# Patient Record
Sex: Male | Born: 1945
Health system: Southern US, Community
[De-identification: ages and names within clinical notes are randomized; demographics above are authoritative.]

## PROBLEM LIST (undated history)

## (undated) DIAGNOSIS — K219 Gastro-esophageal reflux disease without esophagitis: Secondary | ICD-10-CM

## (undated) DIAGNOSIS — F429 Obsessive-compulsive disorder, unspecified: Secondary | ICD-10-CM

## (undated) DIAGNOSIS — C4492 Squamous cell carcinoma of skin, unspecified: Secondary | ICD-10-CM

## (undated) DIAGNOSIS — I2510 Atherosclerotic heart disease of native coronary artery without angina pectoris: Secondary | ICD-10-CM

## (undated) DIAGNOSIS — E785 Hyperlipidemia, unspecified: Secondary | ICD-10-CM

## (undated) DIAGNOSIS — I10 Essential (primary) hypertension: Secondary | ICD-10-CM

## (undated) DIAGNOSIS — K602 Anal fissure, unspecified: Secondary | ICD-10-CM

## (undated) DIAGNOSIS — L409 Psoriasis, unspecified: Secondary | ICD-10-CM

## (undated) DIAGNOSIS — L408 Other psoriasis: Secondary | ICD-10-CM

## (undated) DIAGNOSIS — M549 Dorsalgia, unspecified: Secondary | ICD-10-CM

## (undated) DIAGNOSIS — K5730 Diverticulosis of large intestine without perforation or abscess without bleeding: Secondary | ICD-10-CM

## (undated) DIAGNOSIS — Z86010 Personal history of colonic polyps: Secondary | ICD-10-CM

## (undated) HISTORY — DX: Gastro-esophageal reflux disease without esophagitis: K21.9

## (undated) HISTORY — PX: TONSILLECTOMY: SUR1361

## (undated) HISTORY — DX: Obsessive-compulsive disorder, unspecified: F42.9

## (undated) HISTORY — DX: Diverticulosis of large intestine without perforation or abscess without bleeding: K57.30

## (undated) HISTORY — PX: SKIN CANCER EXCISION: SHX779

## (undated) HISTORY — DX: Other psoriasis: L40.8

## (undated) HISTORY — DX: Personal history of colonic polyps: Z86.010

## (undated) HISTORY — DX: Anal fissure, unspecified: K60.2

## (undated) HISTORY — DX: Essential (primary) hypertension: I10

## (undated) HISTORY — DX: Dorsalgia, unspecified: M54.9

## (undated) HISTORY — DX: Psoriasis, unspecified: L40.9

## (undated) HISTORY — DX: Hyperlipidemia, unspecified: E78.5

## (undated) HISTORY — PX: POLYPECTOMY: SHX149

## (undated) HISTORY — PX: COLONOSCOPY: SHX174

## (undated) HISTORY — PX: COLONOSCOPY W/ POLYPECTOMY: SHX1380

## (undated) HISTORY — DX: Squamous cell carcinoma of skin, unspecified: C44.92

---

## 2005-07-24 ENCOUNTER — Ambulatory Visit: Payer: Self-pay | Admitting: Internal Medicine

## 2005-08-26 ENCOUNTER — Ambulatory Visit: Payer: Self-pay | Admitting: Internal Medicine

## 2005-09-11 ENCOUNTER — Ambulatory Visit: Payer: Self-pay | Admitting: Gastroenterology

## 2005-09-23 ENCOUNTER — Encounter (INDEPENDENT_AMBULATORY_CARE_PROVIDER_SITE_OTHER): Payer: Self-pay | Admitting: *Deleted

## 2005-09-23 ENCOUNTER — Ambulatory Visit: Payer: Self-pay | Admitting: Gastroenterology

## 2005-11-25 ENCOUNTER — Ambulatory Visit: Payer: Self-pay | Admitting: Internal Medicine

## 2006-08-11 ENCOUNTER — Ambulatory Visit: Payer: Self-pay | Admitting: Internal Medicine

## 2006-08-11 LAB — CONVERTED CEMR LAB
Albumin: 3.4 g/dL — ABNORMAL LOW (ref 3.5–5.2)
Chol/HDL Ratio, serum: 3.8
LDL Cholesterol: 120 mg/dL — ABNORMAL HIGH (ref 0–99)
Total Bilirubin: 0.9 mg/dL (ref 0.3–1.2)
Triglyceride fasting, serum: 89 mg/dL (ref 0–149)
VLDL: 18 mg/dL (ref 0–40)

## 2006-09-01 ENCOUNTER — Ambulatory Visit: Payer: Self-pay | Admitting: Internal Medicine

## 2006-09-22 ENCOUNTER — Ambulatory Visit: Payer: Self-pay

## 2006-10-27 ENCOUNTER — Ambulatory Visit: Payer: Self-pay | Admitting: Internal Medicine

## 2007-02-10 DIAGNOSIS — E785 Hyperlipidemia, unspecified: Secondary | ICD-10-CM | POA: Insufficient documentation

## 2007-02-10 DIAGNOSIS — L408 Other psoriasis: Secondary | ICD-10-CM

## 2007-02-10 DIAGNOSIS — M549 Dorsalgia, unspecified: Secondary | ICD-10-CM

## 2007-02-10 DIAGNOSIS — K5730 Diverticulosis of large intestine without perforation or abscess without bleeding: Secondary | ICD-10-CM | POA: Insufficient documentation

## 2007-02-10 HISTORY — DX: Other psoriasis: L40.8

## 2007-02-10 HISTORY — DX: Dorsalgia, unspecified: M54.9

## 2007-02-16 ENCOUNTER — Ambulatory Visit: Payer: Self-pay | Admitting: Internal Medicine

## 2007-02-16 LAB — CONVERTED CEMR LAB
ALT: 20 units/L (ref 0–53)
AST: 22 units/L (ref 0–37)
Bilirubin, Direct: 0.2 mg/dL (ref 0.0–0.3)
Cholesterol: 198 mg/dL (ref 0–200)
HDL: 51.2 mg/dL (ref >39.0)
LDL Cholesterol: 123 mg/dL — ABNORMAL HIGH (ref 0–99)
Total Protein: 6.9 g/dL (ref 6.0–8.3)

## 2007-02-23 ENCOUNTER — Ambulatory Visit: Payer: Self-pay | Admitting: Internal Medicine

## 2007-02-23 DIAGNOSIS — K219 Gastro-esophageal reflux disease without esophagitis: Secondary | ICD-10-CM

## 2007-02-23 HISTORY — DX: Gastro-esophageal reflux disease without esophagitis: K21.9

## 2007-03-12 ENCOUNTER — Telehealth: Payer: Self-pay | Admitting: Internal Medicine

## 2007-08-03 ENCOUNTER — Ambulatory Visit: Payer: Self-pay | Admitting: Internal Medicine

## 2007-08-03 LAB — CONVERTED CEMR LAB
ALT: 35 units/L (ref 0–53)
Basophils Relative: 0.5 % (ref 0.0–1.0)
Bilirubin, Direct: 0.2 mg/dL (ref 0.0–0.3)
CO2: 27 meq/L (ref 19–32)
Eosinophils Relative: 1.9 % (ref 0.0–5.0)
GFR calc Af Amer: 98 mL/min
Glucose, Bld: 108 mg/dL — ABNORMAL HIGH (ref 70–99)
Hemoglobin: 15.7 g/dL (ref 13.0–17.0)
Lymphocytes Relative: 38.6 % (ref 12.0–46.0)
Monocytes Absolute: 0.6 10*3/uL (ref 0.2–0.7)
Neutro Abs: 2.4 10*3/uL (ref 1.4–7.7)
Nitrite: NEGATIVE
Potassium: 4.2 meq/L (ref 3.5–5.1)
Specific Gravity, Urine: 1.02
TSH: 1.97 microintl units/mL (ref 0.35–5.50)
Total Protein: 6.5 g/dL (ref 6.0–8.3)
VLDL: 14 mg/dL (ref 0–40)
WBC Urine, dipstick: NEGATIVE
WBC: 5.1 10*3/uL (ref 4.5–10.5)

## 2007-08-10 ENCOUNTER — Ambulatory Visit: Payer: Self-pay | Admitting: Internal Medicine

## 2007-08-10 LAB — CONVERTED CEMR LAB: LDL Goal: 130 mg/dL

## 2008-02-05 ENCOUNTER — Ambulatory Visit: Payer: Self-pay | Admitting: Internal Medicine

## 2008-02-05 DIAGNOSIS — T887XXA Unspecified adverse effect of drug or medicament, initial encounter: Secondary | ICD-10-CM | POA: Insufficient documentation

## 2008-02-05 LAB — CONVERTED CEMR LAB
AST: 26 units/L (ref 0–37)
Albumin: 3.3 g/dL — ABNORMAL LOW (ref 3.5–5.2)
HDL: 47.4 mg/dL (ref >39.0)
LDL Cholesterol: 122 mg/dL — ABNORMAL HIGH (ref 0–99)
Total Bilirubin: 0.9 mg/dL (ref 0.3–1.2)
Total CHOL/HDL Ratio: 3.9
VLDL: 14 mg/dL (ref 0–40)

## 2008-02-15 ENCOUNTER — Ambulatory Visit: Payer: Self-pay | Admitting: Internal Medicine

## 2008-08-08 ENCOUNTER — Ambulatory Visit: Payer: Self-pay | Admitting: Internal Medicine

## 2008-08-08 LAB — CONVERTED CEMR LAB
AST: 22 units/L (ref 0–37)
Albumin: 3.5 g/dL (ref 3.5–5.2)
BUN: 15 mg/dL (ref 6–23)
Basophils Absolute: 0 10*3/uL (ref 0.0–0.1)
Basophils Relative: 0.3 % (ref 0.0–3.0)
Bilirubin Urine: NEGATIVE
Blood in Urine, dipstick: NEGATIVE
Chloride: 105 meq/L (ref 96–112)
Cholesterol: 198 mg/dL (ref 0–200)
Creatinine, Ser: 0.8 mg/dL (ref 0.4–1.5)
Eosinophils Absolute: 0.1 10*3/uL (ref 0.0–0.7)
Eosinophils Relative: 2 % (ref 0.0–5.0)
GFR calc Af Amer: 127 mL/min
GFR calc non Af Amer: 105 mL/min
Glucose, Urine, Semiquant: NEGATIVE
HCT: 46.2 % (ref 39.0–52.0)
HDL: 57.4 mg/dL (ref >39.0)
Ketones, urine, test strip: NEGATIVE
MCHC: 34.7 g/dL (ref 30.0–36.0)
MCV: 99.9 fL (ref 78.0–100.0)
Monocytes Absolute: 0.5 10*3/uL (ref 0.1–1.0)
Neutrophils Relative %: 47.1 % (ref 43.0–77.0)
PSA: 0.48 ng/mL (ref 0.10–4.00)
Platelets: 152 10*3/uL (ref 150–400)
Potassium: 4.1 meq/L (ref 3.5–5.1)
Protein, U semiquant: NEGATIVE
Specific Gravity, Urine: 1.02
Total Bilirubin: 0.9 mg/dL (ref 0.3–1.2)
Triglycerides: 63 mg/dL (ref 0–149)
VLDL: 13 mg/dL (ref 0–40)
pH: 7

## 2008-08-15 ENCOUNTER — Ambulatory Visit: Payer: Self-pay | Admitting: Internal Medicine

## 2009-02-06 ENCOUNTER — Ambulatory Visit: Payer: Self-pay | Admitting: Internal Medicine

## 2009-02-06 LAB — CONVERTED CEMR LAB
ALT: 23 units/L (ref 0–53)
AST: 30 units/L (ref 0–37)
Albumin: 3.4 g/dL — ABNORMAL LOW (ref 3.5–5.2)
Cholesterol: 202 mg/dL — ABNORMAL HIGH (ref 0–200)
Direct LDL: 132.5 mg/dL
HDL: 55.8 mg/dL (ref >39.00)
Total Protein: 6.9 g/dL (ref 6.0–8.3)
Triglycerides: 131 mg/dL (ref 0.0–149.0)

## 2009-02-13 ENCOUNTER — Ambulatory Visit: Payer: Self-pay | Admitting: Internal Medicine

## 2009-08-21 ENCOUNTER — Ambulatory Visit: Payer: Self-pay | Admitting: Internal Medicine

## 2009-08-21 LAB — CONVERTED CEMR LAB
AST: 30 units/L (ref 0–37)
Alkaline Phosphatase: 46 units/L (ref 39–117)
Basophils Relative: 0.9 % (ref 0.0–3.0)
Bilirubin, Direct: 0.1 mg/dL (ref 0.0–0.3)
Calcium: 9.3 mg/dL (ref 8.4–10.5)
Creatinine, Ser: 1.1 mg/dL (ref 0.4–1.5)
Direct LDL: 134.7 mg/dL
Eosinophils Absolute: 0.1 10*3/uL (ref 0.0–0.7)
Eosinophils Relative: 1.8 % (ref 0.0–5.0)
GFR calc non Af Amer: 72.14 mL/min (ref >60)
Hemoglobin: 15.8 g/dL (ref 13.0–17.0)
Lymphocytes Relative: 31.8 % (ref 12.0–46.0)
MCHC: 33.5 g/dL (ref 30.0–36.0)
Monocytes Relative: 10.5 % (ref 3.0–12.0)
Neutrophils Relative %: 55 % (ref 43.0–77.0)
PSA: 0.55 ng/mL (ref 0.10–4.00)
RBC: 4.66 M/uL (ref 4.22–5.81)
Sodium: 139 meq/L (ref 135–145)
Total CHOL/HDL Ratio: 3
Total Protein: 7.5 g/dL (ref 6.0–8.3)
Triglycerides: 96 mg/dL (ref 0.0–149.0)
WBC: 5.2 10*3/uL (ref 4.5–10.5)

## 2010-02-12 ENCOUNTER — Ambulatory Visit: Payer: Self-pay | Admitting: Internal Medicine

## 2010-02-12 LAB — CONVERTED CEMR LAB
ALT: 23 units/L (ref 0–53)
AST: 28 units/L (ref 0–37)
Bilirubin, Direct: 0.2 mg/dL (ref 0.0–0.3)
Total Bilirubin: 0.7 mg/dL (ref 0.3–1.2)
Triglycerides: 140 mg/dL (ref 0.0–149.0)

## 2010-02-19 ENCOUNTER — Ambulatory Visit: Payer: Self-pay | Admitting: Internal Medicine

## 2010-04-17 ENCOUNTER — Telehealth: Payer: Self-pay | Admitting: Internal Medicine

## 2010-08-20 ENCOUNTER — Other Ambulatory Visit: Payer: Self-pay | Admitting: Internal Medicine

## 2010-08-20 ENCOUNTER — Ambulatory Visit
Admission: RE | Admit: 2010-08-20 | Discharge: 2010-08-20 | Payer: Self-pay | Source: Home / Self Care | Attending: Internal Medicine | Admitting: Internal Medicine

## 2010-08-20 LAB — LIPID PANEL
Cholesterol: 212 mg/dL — ABNORMAL HIGH (ref 0–200)
Total CHOL/HDL Ratio: 3
Triglycerides: 108 mg/dL (ref 0.0–149.0)
VLDL: 21.6 mg/dL (ref 0.0–40.0)

## 2010-08-20 LAB — CBC WITH DIFFERENTIAL/PLATELET
Basophils Relative: 0.4 % (ref 0.0–3.0)
Eosinophils Relative: 1.2 % (ref 0.0–5.0)
Hemoglobin: 16.4 g/dL (ref 13.0–17.0)
Lymphocytes Relative: 28 % (ref 12.0–46.0)
Monocytes Relative: 7.7 % (ref 3.0–12.0)
Neutrophils Relative %: 62.7 % (ref 43.0–77.0)
RBC: 4.69 Mil/uL (ref 4.22–5.81)
WBC: 8.4 10*3/uL (ref 4.5–10.5)

## 2010-08-20 LAB — CONVERTED CEMR LAB
Blood in Urine, dipstick: NEGATIVE
Nitrite: NEGATIVE
Protein, U semiquant: NEGATIVE
Urobilinogen, UA: 0.2
WBC Urine, dipstick: NEGATIVE

## 2010-08-20 LAB — BASIC METABOLIC PANEL
Calcium: 9.1 mg/dL (ref 8.4–10.5)
Creatinine, Ser: 1.1 mg/dL (ref 0.4–1.5)
Sodium: 137 mEq/L (ref 135–145)

## 2010-08-20 LAB — HEPATIC FUNCTION PANEL
ALT: 26 U/L (ref 0–53)
AST: 25 U/L (ref 0–37)
Alkaline Phosphatase: 48 U/L (ref 39–117)
Bilirubin, Direct: 0.1 mg/dL (ref 0.0–0.3)
Total Protein: 6.9 g/dL (ref 6.0–8.3)

## 2010-08-20 LAB — LDL CHOLESTEROL, DIRECT: Direct LDL: 137.2 mg/dL

## 2010-08-24 ENCOUNTER — Ambulatory Visit (INDEPENDENT_AMBULATORY_CARE_PROVIDER_SITE_OTHER)
Admission: RE | Admit: 2010-08-24 | Discharge: 2010-08-24 | Payer: Self-pay | Source: Home / Self Care | Attending: Internal Medicine | Admitting: Internal Medicine

## 2010-08-24 DIAGNOSIS — C4492 Squamous cell carcinoma of skin, unspecified: Secondary | ICD-10-CM

## 2010-08-24 DIAGNOSIS — Z0000 Encounter for general adult medical examination without abnormal findings: Secondary | ICD-10-CM

## 2010-08-24 HISTORY — DX: Squamous cell carcinoma of skin, unspecified: C44.92

## 2010-08-27 ENCOUNTER — Other Ambulatory Visit: Payer: Self-pay | Admitting: Dermatology

## 2010-08-29 NOTE — Progress Notes (Signed)
Summary: DRUG SCREEN TEST ASAP   Phone Note Call from Patient Call back at Home Phone [phone] Call back at [phone]   Caller: Patient Call For: Stacie Glaze MD Reason for Call: Acute Illness Summary of Call: PT NEEDS DRUG SCREENING TEST DONE NOW AND RESULTS MUST BE AVAILABLE NO LATER FRIDAY 04-20-2010 FOR WORK ASSIGNMENT IN [place] AT [place] Initial call taken by: Heron Sabins,  April 17, 2010 11:05 AM  Follow-up for Phone Call        we can do here, but he will need to find out exactly what drug test he needs-there are many kins-he can call company and ask them Follow-up by: Willy Eddy, LPN,  April 17, 2010 11:09 AM  Additional Follow-up for Phone Call Additional follow up Details #1::        PT WILL CB WITH SPECIFIC TEST NEEDED Additional Follow-up by: Heron Sabins,  April 17, 2010 11:18 AM    Additional Follow-up for Phone Call Additional follow up Details #2::    as of 04-24-2010 pt did not cb Follow-up by: Heron Sabins,  April 24, 2010 3:03 PM

## 2010-08-29 NOTE — Assessment & Plan Note (Signed)
Summary: 6 MONTH FOLLOW UP/CJR   Vital Signs:  Patient profile:   65 year old male Height:      72 inches Weight:      225 pounds BMI:     30.63 Temp:     98.2 degrees F oral Pulse rate:   68 / minute Pulse rhythm:   regular Resp:     14 per minute BP sitting:   140 / 80  (left arm)  Vitals Entered By: Willy Eddy, LPN (February 19, 2010 9:47 AM) CC: [facility]   Primary Care Provider:  Stacie Glaze MD  CC:  [facility].  History of Present Illness:  Hyperlipidemia Follow-Up      This is a 65 year old man who presents for Hyperlipidemia follow-up.  The patient denies muscle aches, GI upset, abdominal pain, flushing, itching, constipation, diarrhea, and fatigue.  The patient denies the following symptoms: chest pain/pressure, exercise intolerance, dypsnea, palpitations, syncope, and pedal edema.  Compliance with medications (by patient report) has been near 100%.  Dietary compliance has been good.  The patient reports exercising 3-4X per week.  Adjunctive measures currently used by the patient include fish oil supplements.    Preventive Screening-Counseling & Management  Alcohol-Tobacco     Smoking Status: never  Problems Prior to Update: 1)  Uns Advrs Eff Uns Rx Medicinal&biological Sbstnc  (ICD-995.20) 2)  Preventive Health Care  (ICD-V70.0) 3)  Routine General Medical Exam@health  Care Facl  (ICD-V70.0) 4)  Gerd  (ICD-530.81) 5)  Back Pain, Chronic  (ICD-724.5) 6)  Psoriasis  (ICD-696.1) 7)  Hyperlipidemia  (ICD-272.4) 8)  Diverticulosis, Colon  (ICD-562.10)  Current Problems (verified): 1)  Uns Advrs Eff Uns Rx Medicinal&biological Sbstnc  (ICD-995.20) 2)  Preventive Health Care  (ICD-V70.0) 3)  Routine General Medical Exam@health  Care Facl  (ICD-V70.0) 4)  Gerd  (ICD-530.81) 5)  Back Pain, Chronic  (ICD-724.5) 6)  Psoriasis  (ICD-696.1) 7)  Hyperlipidemia  (ICD-272.4) 8)  Diverticulosis, Colon  (ICD-562.10)  Medications Prior to Update: 1)  Famotidine 20 Mg   Tabs (Famotidine) .... One By Mouth Two Times A Day As Needed 2)  Simvastatin 40 Mg  Tabs (Simvastatin) .... Once Daily 3)  Clotrimazole-Betamethasone 1-0.05 % Lotn (Clotrimazole-Betamethasone) .... Aplly To Site Bid  Current Medications (verified): 1)  Famotidine 20 Mg  Tabs (Famotidine) .... One By Mouth Two Times A Day As Needed 2)  Lipitor 20 Mg Tabs (Atorvastatin Calcium) .... One By Mouth Daily 3)  Clotrimazole-Betamethasone 1-0.05 % Lotn (Clotrimazole-Betamethasone) .... Aplly To Site Bid 4)  Krill Oil 1000 Mg Caps (Krill Oil) .... Two By Mouth Two Times A Day  Allergies (verified): No Known Drug Allergies  Past History:  Family History: Last updated: 02/23/2007 father w CA lung Family History Lung cancer Family History of Stroke F 1st degree relative <60 no siblings  Social History: Last updated: 02/23/2007 Married Never Smoked  Risk Factors: Smoking Status: never (02/19/2010)  Past medical, surgical, family and social histories (including risk factors) reviewed, and no changes noted (except as noted below).  Past Medical History: Reviewed history from 02/23/2007 and no changes required. Diverticulosis, colon Hyperlipidemia Hypertension scalp psorisis GERD  Past Surgical History: Reviewed history from 08/10/2007 and no changes required. Tonsillectomy Colon polypectomy  Family History: Reviewed history from 02/23/2007 and no changes required. father w CA lung Family History Lung cancer Family History of Stroke F 1st degree relative <60 no siblings  Social History: Reviewed history from 02/23/2007 and no changes required. Married  Never Smoked  Review of Systems  The patient denies anorexia, fever, weight loss, weight gain, vision loss, decreased hearing, hoarseness, chest pain, syncope, dyspnea on exertion, peripheral edema, prolonged cough, headaches, hemoptysis, abdominal pain, melena, hematochezia, severe indigestion/heartburn, hematuria,  incontinence, genital sores, muscle weakness, suspicious skin lesions, transient blindness, difficulty walking, depression, unusual weight change, abnormal bleeding, enlarged lymph nodes, angioedema, breast masses, and testicular masses.    Physical Exam  General:  Well-developed,well-nourished,in no acute distress; alert,appropriate and cooperative throughout examination Head:  normocephalic and no abnormalities palpated.   Eyes:  pupils equal and pupils round.   Ears:  R ear normal and L ear normal.   Nose:  no external deformity and no nasal discharge.   Mouth:  good dentition and pharynx pink and moist.   Neck:  No deformities, masses, or tenderness noted. Lungs:  normal respiratory effort and no dullness.   Abdomen:  Bowel sounds positive,abdomen soft and non-tender without masses, organomegaly or hernias noted. Msk:  No deformity or scoliosis noted of thoracic or lumbar spine.  no joint tenderness.   Pulses:  R and L carotid,radial,femoral,dorsalis pedis and posterior tibial pulses are full and equal bilaterally Extremities:  No clubbing, cyanosis, edema, or deformity noted with normal full range of motion of all joints.     Impression & Recommendations:  Problem # 1:  HYPERLIPIDEMIA (ICD-272.4) discussd the dosing of the statin and safety of zocar and changing to lipitor His updated medication list for this problem includes:    Lipitor 20 Mg Tabs (Atorvastatin calcium) ..... One by mouth daily  Labs Reviewed: SGOT: 28 (02/12/2010)   SGPT: 23 (02/12/2010)  Lipid Goals: Chol Goal: 200 (08/10/2007)   HDL Goal: 40 (08/10/2007)   LDL Goal: 130 (08/10/2007)   TG Goal: 150 (08/10/2007)  Prior 10 Yr Risk Heart Disease: 11 % (02/15/2008)   HDL:60.70 (02/12/2010), 64.70 (08/21/2009)  LDL:128 (08/08/2008), 122 (02/05/2008)  Chol:219 (02/12/2010), 211 (08/21/2009)  Trig:140.0 (02/12/2010), 96.0 (08/21/2009)  Problem # 2:  PSORIASIS (ICD-696.1)  Problem # 3:  GERD (ICD-530.81)  His  updated medication list for this problem includes:    Famotidine 20 Mg Tabs (Famotidine) ..... One by mouth two times a day as needed  Labs Reviewed: Hgb: 15.8 (08/21/2009)   Hct: 47.2 (08/21/2009)  Problem # 4:  BACK PAIN, CHRONIC (ICD-724.5)  Complete Medication List: 1)  Famotidine 20 Mg Tabs (Famotidine) .... One by mouth two times a day as needed 2)  Lipitor 20 Mg Tabs (Atorvastatin calcium) .... One by mouth daily 3)  Clotrimazole-betamethasone 1-0.05 % Lotn (Clotrimazole-betamethasone) .... Aplly to site bid 4)  Krill Oil 1000 Mg Caps (Krill oil) .... Two by mouth two times a day  Patient Instructions: 1)  add kril oil 4 capsules a day 2)  Jan CPX Prescriptions: LIPITOR 20 MG TABS (ATORVASTATIN CALCIUM) one by mouth daily  #30 x 11   Entered and Authorized by:   Stacie Glaze MD   Signed by:   Stacie Glaze MD on 02/19/2010   Method used:   Electronically to        [phone]* (retail)       [address] [place], [place]  [address]       Ph: [phone] or [phone]       Fax: [phone]   RxID:   [id number]

## 2010-08-29 NOTE — Assessment & Plan Note (Signed)
Summary: cpx/njr/pt will be fasting//ccm   Vital Signs:  Patient profile:   65 year old male Height:      72 inches Weight:      227 pounds BMI:     30.90 Temp:     98.2 degrees F oral Pulse rate:   72 / minute Resp:     14 per minute BP sitting:   132 / 76  (left arm)  Vitals Entered By: Willy Eddy, LPN (August 21, 2009 8:54 AM) CC: cpx- did  not have labs- fasting this am   CC:  cpx- did  not have labs- fasting this am.  History of Present Illness: The pt was asked about all immunizations, health maint. services that are appropriate to their age and was given guidance on diet exercize  and weight management  Pt is doing well increased PNdrip and awakes with congestion  Preventive Screening-Counseling & Management  Alcohol-Tobacco     Smoking Status: never  Problems Prior to Update: 1)  Uns Advrs Eff Uns Rx Medicinal&biological Sbstnc  (ICD-995.20) 2)  Preventive Health Care  (ICD-V70.0) 3)  Routine General Medical Exam@health  Care Facl  (ICD-V70.0) 4)  Gerd  (ICD-530.81) 5)  Back Pain, Chronic  (ICD-724.5) 6)  Psoriasis  (ICD-696.1) 7)  Hyperlipidemia  (ICD-272.4) 8)  Diverticulosis, Colon  (ICD-562.10)  Medications Prior to Update: 1)  Famotidine 20 Mg  Tabs (Famotidine) .... One By Mouth Two Times A Day As Needed 2)  Simvastatin 40 Mg  Tabs (Simvastatin) .... Once Daily 3)  Clotrimazole-Betamethasone 1-0.05 % Lotn (Clotrimazole-Betamethasone) .... Aplly To Site Bid  Current Medications (verified): 1)  Famotidine 20 Mg  Tabs (Famotidine) .... One By Mouth Two Times A Day As Needed 2)  Simvastatin 40 Mg  Tabs (Simvastatin) .... Once Daily 3)  Clotrimazole-Betamethasone 1-0.05 % Lotn (Clotrimazole-Betamethasone) .... Aplly To Site Bid  Allergies (verified): No Known Drug Allergies  Past History:  Family History: Last updated: 02/23/2007 father w CA lung Family History Lung cancer Family History of Stroke F 1st degree relative <60 no  siblings  Social History: Last updated: 02/23/2007 Married Never Smoked  Risk Factors: Smoking Status: never (08/21/2009)  Past medical, surgical, family and social histories (including risk factors) reviewed, and no changes noted (except as noted below).  Past Medical History: Reviewed history from 02/23/2007 and no changes required. Diverticulosis, colon Hyperlipidemia Hypertension scalp psorisis GERD  Past Surgical History: Reviewed history from 08/10/2007 and no changes required. Tonsillectomy Colon polypectomy  Family History: Reviewed history from 02/23/2007 and no changes required. father w CA lung Family History Lung cancer Family History of Stroke F 1st degree relative <60 no siblings  Social History: Reviewed history from 02/23/2007 and no changes required. Married Never Smoked  Review of Systems       The patient complains of hoarseness.  The patient denies anorexia, fever, weight loss, weight gain, vision loss, decreased hearing, chest pain, syncope, dyspnea on exertion, peripheral edema, prolonged cough, headaches, hemoptysis, abdominal pain, melena, hematochezia, severe indigestion/heartburn, hematuria, incontinence, genital sores, muscle weakness, suspicious skin lesions, transient blindness, difficulty walking, depression, unusual weight change, abnormal bleeding, enlarged lymph nodes, angioedema, breast masses, and testicular masses.         Flu Vaccine Consent Questions     Do you have a history of severe allergic reactions to this vaccine? no    Any prior history of allergic reactions to egg and/or gelatin? no    Do you have a sensitivity to the preservative Thimersol? no  Do you have a past history of Guillan-Barre Syndrome? no    Do you currently have an acute febrile illness? no    Have you ever had a severe reaction to latex? no    Vaccine information given and explained to patient? yes    Are you currently pregnant? no    Lot  Number:[id number]   Exp Date:01/25/2010   Site Given  Left Deltoid IM   Physical Exam  General:  Well-developed,well-nourished,in no acute distress; alert,appropriate and cooperative throughout examination Head:  normocephalic and no abnormalities palpated.   Eyes:  pupils equal and pupils round.   Ears:  R ear normal and L ear normal.   Nose:  no external deformity and no nasal discharge.   Mouth:  good dentition and pharynx pink and moist.   Neck:  No deformities, masses, or tenderness noted. Chest Wall:  no deformities and no masses.   Lungs:  normal respiratory effort and no dullness.   Heart:  normal rate, regular rhythm, and no murmur.   Abdomen:  Bowel sounds positive,abdomen soft and non-tender without masses, organomegaly or hernias noted. Rectal:  normal sphincter tone and external hemorrhoid(s).   Genitalia:  circumcised and no urethral discharge.   Prostate:  no nodules and 1+ enlarged.   Msk:  No deformity or scoliosis noted of thoracic or lumbar spine.  no joint tenderness.   Pulses:  R and L carotid,radial,femoral,dorsalis pedis and posterior tibial pulses are full and equal bilaterally Extremities:  trace left pedal edema and trace right pedal edema.   Neurologic:  alert & oriented X3 and DTRs symmetrical and normal.   Skin:  psoriaform plaques Cervical Nodes:  No lymphadenopathy noted Axillary Nodes:  No palpable lymphadenopathy Inguinal Nodes:  No significant adenopathy Psych:  Cognition and judgment appear intact. Alert and cooperative with normal attention span and concentration. No apparent delusions, illusions, hallucinations   Impression & Recommendations:  Problem # 1:  PREVENTIVE HEALTH CARE (ICD-V70.0)  Colonoscopy: Adenomatous Polyp (09/23/2005) Td Booster: Tdap (08/15/2008)   Flu Vax: Fluvax 3+ (08/21/2009)   Chol: 202 (02/06/2009)   HDL: 55.80 (02/06/2009)   LDL: 128 (08/08/2008)   TG: 131.0 (02/06/2009) TSH: 1.89 (08/08/2008)   PSA: 0.48  (08/08/2008) Next Colonoscopy due:: 09/2010 (08/15/2008)  Discussed using sunscreen, use of alcohol, drug use, self testicular exam, routine dental care, routine eye care, routine physical exam, seat belts, multiple vitamins, osteoporosis prevention, adequate calcium intake in diet, and recommendations for immunizations.  Discussed exercise and checking cholesterol.  Discussed gun safety, safe sex, and contraception. Also recommend checking PSA.  Orders: Venipuncture ([address]) TLB-BMP (Basic Metabolic Panel-BMET) (80048-METABOL) TLB-CBC Platelet - w/Differential (85025-CBCD) TLB-Hepatic/Liver Function Pnl (80076-HEPATIC) TLB-TSH (Thyroid Stimulating Hormone) (84443-TSH) TLB-Lipid Panel (80061-LIPID) TLB-PSA (Prostate Specific Antigen) (84153-PSA) TLB-Udip ONLY (81003-UDIP)  Problem # 2:  PSORIASIS (ICD-696.1) stable  Complete Medication List: 1)  Famotidine 20 Mg Tabs (Famotidine) .... One by mouth two times a day as needed 2)  Simvastatin 40 Mg Tabs (Simvastatin) .... Once daily 3)  Clotrimazole-betamethasone 1-0.05 % Lotn (Clotrimazole-betamethasone) .... Aplly to site bid  Other Orders: Admin 1st Vaccine (60454) Flu Vaccine 1yrs + ([address])  Patient Instructions: 1)  Please schedule a follow-up appointment in 6 months. 2)  Hepatic Panel prior to visit, ICD-9:995.20 3)  Lipid Panel prior to visit, ICD-9:272.4  Appended Document: Orders Update     Clinical Lists Changes  Orders: Added new Service order of UA Dipstick W/ Micro (manual) (91478) - Signed      Appended  Document: cpx/njr/pt will be fasting//ccm  Laboratory Results   Urine Tests    Routine Urinalysis   Color: yellow Appearance: Clear Glucose: negative   (Normal Range: Negative) Bilirubin: negative   (Normal Range: Negative) Ketone: negative   (Normal Range: Negative) Spec. Gravity: 1.020   (Normal Range: 1.003-1.035) Blood: negative   (Normal Range: Negative) pH: 8.5   (Normal Range:  5.0-8.0) Protein: negative   (Normal Range: Negative) Urobilinogen: 0.2   (Normal Range: 0-1) Nitrite: negative   (Normal Range: Negative) Leukocyte Esterace: negative   (Normal Range: Negative)    Comments: Rita Ohara  August 21, 2009 1:32 PM'

## 2010-09-13 NOTE — Assessment & Plan Note (Signed)
Summary: cpx/njr   Vital Signs:  Patient profile:   65 year old male Height:      72 inches Weight:      232 pounds BMI:     31.58 Temp:     98.2 degrees F oral Pulse rate:   68 / minute Resp:     14 per minute BP sitting:   124 / 80  (left arm)  Vitals Entered By: Willy Eddy, LPN (August 24, 2010 11:02 AM) CC: CPX Is Patient Diabetic? No   Primary Care Provider:  Stacie Glaze MD  CC:  CPX.  History of Present Illness: The pt was asked about all immunizations, health maint. services that are appropriate to their age and was given guidance on diet exercize  and weight management  lesion on face that look like SCCA and has quickly grown wirth ulceration.  Preventive Screening-Counseling & Management  Alcohol-Tobacco     Smoking Status: never  Problems Prior to Update: 1)  Squamous Cell Carcinoma of Skin Site Unspecified  (ICD-173.92) 2)  Uns Advrs Eff Uns Rx Medicinal&biological Sbstnc  (ICD-995.20) 3)  Preventive Health Care  (ICD-V70.0) 4)  Routine General Medical Exam@health  Care Facl  (ICD-V70.0) 5)  Gerd  (ICD-530.81) 6)  Back Pain, Chronic  (ICD-724.5) 7)  Psoriasis  (ICD-696.1) 8)  Hyperlipidemia  (ICD-272.4) 9)  Diverticulosis, Colon  (ICD-562.10)  Current Problems (verified): 1)  Uns Advrs Eff Uns Rx Medicinal&biological Sbstnc  (ICD-995.20) 2)  Preventive Health Care  (ICD-V70.0) 3)  Routine General Medical Exam@health  Care Facl  (ICD-V70.0) 4)  Gerd  (ICD-530.81) 5)  Back Pain, Chronic  (ICD-724.5) 6)  Psoriasis  (ICD-696.1) 7)  Hyperlipidemia  (ICD-272.4) 8)  Diverticulosis, Colon  (ICD-562.10)  Medications Prior to Update: 1)  Famotidine 20 Mg  Tabs (Famotidine) .... One By Mouth Two Times A Day As Needed 2)  Lipitor 20 Mg Tabs (Atorvastatin Calcium) .... One By Mouth Daily 3)  Clotrimazole-Betamethasone 1-0.05 % Lotn (Clotrimazole-Betamethasone) .... Aplly To Site Bid 4)  Krill Oil 1000 Mg Caps (Krill Oil) .... Two By Mouth Two Times A  Day  Current Medications (verified): 1)  Famotidine 20 Mg  Tabs (Famotidine) .... One By Mouth Two Times A Day As Needed 2)  Lipitor 20 Mg Tabs (Atorvastatin Calcium) .... One By Mouth Daily 3)  Clotrimazole-Betamethasone 1-0.05 % Lotn (Clotrimazole-Betamethasone) .... Aplly To Site Bid 4)  Krill Oil 1000 Mg Caps (Krill Oil) .... Two By Mouth Two Times A Day  Allergies (verified): No Known Drug Allergies  Contraindications/Deferment of Procedures/Staging:    Test/Procedure: FLU VAX    Reason for deferment: patient declined   Past History:  Family History: Last updated: 02/23/2007 father w CA lung Family History Lung cancer Family History of Stroke F 1st degree relative <60 no siblings  Social History: Last updated: 02/23/2007 Married Never Smoked  Risk Factors: Smoking Status: never (08/24/2010)  Past medical, surgical, family and social histories (including risk factors) reviewed, and no changes noted (except as noted below).  Past Medical History: Reviewed history from 02/23/2007 and no changes required. Diverticulosis, colon Hyperlipidemia Hypertension scalp psorisis GERD  Past Surgical History: Reviewed history from 08/10/2007 and no changes required. Tonsillectomy Colon polypectomy  Family History: Reviewed history from 02/23/2007 and no changes required. father w CA lung Family History Lung cancer Family History of Stroke F 1st degree relative <60 no siblings  Social History: Reviewed history from 02/23/2007 and no changes required. Married Never Smoked  Review of Systems  The  patient denies anorexia, fever, weight loss, weight gain, vision loss, decreased hearing, hoarseness, chest pain, syncope, dyspnea on exertion, peripheral edema, prolonged cough, headaches, hemoptysis, abdominal pain, melena, hematochezia, severe indigestion/heartburn, hematuria, incontinence, genital sores, muscle weakness, suspicious skin lesions, transient blindness,  difficulty walking, depression, unusual weight change, abnormal bleeding, enlarged lymph nodes, angioedema, breast masses, and testicular masses.    Physical Exam  General:  Well-developed,well-nourished,in no acute distress; alert,appropriate and cooperative throughout examination Head:  normocephalic and no abnormalities palpated.   Eyes:  pupils equal and pupils round.   Ears:  R ear normal and L ear normal.   Nose:  no external deformity and no nasal discharge.   Mouth:  good dentition and pharynx pink and moist.   Neck:  No deformities, masses, or tenderness noted. Lungs:  normal respiratory effort and no dullness.   Heart:  normal rate, regular rhythm, and no murmur.   Abdomen:  Bowel sounds positive,abdomen soft and non-tender without masses, organomegaly or hernias noted. Rectal:  no hemorrhoids and normal sphincter tone.   Genitalia:  circumcised and no urethral discharge.   Prostate:  no nodules and 1+ enlarged.   Pulses:  R and L carotid,radial,femoral,dorsalis pedis and posterior tibial pulses are full and equal bilaterally Extremities:  No clubbing, cyanosis, edema, or deformity noted with normal full range of motion of all joints.   Neurologic:  alert & oriented X3 and DTRs symmetrical and normal.     Impression & Recommendations:  Problem # 1:  SQUAMOUS CELL CARCINOMA OF SKIN SITE UNSPECIFIED (ICD-173.92) Assessment New   patient has a lesion on his nose it appears to be either a squamous cell carcinoma or basal cell carcinoma it has arisen in the past month and grown quickly and is now ulcerated he is self referred to a dermatologist but had the appointment is one month out we are calling a dermatology office today to see if we can get him seen sooner.  Orders: Dermatology Referral (Derma)  Problem # 2:  ROUTINE GENERAL MEDICAL EXAM@HEALTH  CARE FACL (ICD-V70.0) Assessment: Unchanged  the patient is intermittently compliant with his Lipitor taking it at least 5 times  out of 7. he was urged to be compliant with 7 day a week Lipitor Colonoscopy: Adenomatous Polyp (09/23/2005) Td Booster: Tdap (08/15/2008)   Flu Vax: Fluvax 3+ (08/21/2009)   Chol: 212 (08/20/2010)   HDL: 67.80 (08/20/2010)   LDL: 128 (08/08/2008)   TG: 108.0 (08/20/2010) TSH: 1.68 (08/20/2010)   PSA: 0.50 (08/20/2010) Next Colonoscopy due:: 09/2010 (08/15/2008)  Discussed using sunscreen, use of alcohol, drug use, self testicular exam, routine dental care, routine eye care, routine physical exam, seat belts, multiple vitamins, osteoporosis prevention, adequate calcium intake in diet, and recommendations for immunizations.  Discussed exercise and checking cholesterol.  Discussed gun safety, safe sex, and contraception. Also recommend checking PSA.  Complete Medication List: 1)  Famotidine 20 Mg Tabs (Famotidine) .... One by mouth two times a day as needed 2)  Lipitor 20 Mg Tabs (Atorvastatin calcium) .... One by mouth daily 3)  Clotrimazole-betamethasone 1-0.05 % Lotn (Clotrimazole-betamethasone) .... Aplly to site bid 4)  Krill Oil 1000 Mg Caps (Krill oil) .... Two by mouth two times a day  Patient Instructions: 1)  Please schedule a follow-up appointment in 6 months. 2)  Hepatic Panel prior to visit, ICD-9:995.20 3)  Lipid Panel prior to visit, ICD-9:272.4 4)  please takt the krill oil as directed   Orders Added: 1)  Dermatology Referral [Derma] 2)  Est.  Patient 40-64 years [[id number]] 3)  Est. Patient Level III [phone]

## 2010-09-20 ENCOUNTER — Telehealth: Payer: Self-pay | Admitting: Internal Medicine

## 2010-09-20 DIAGNOSIS — E785 Hyperlipidemia, unspecified: Secondary | ICD-10-CM

## 2010-09-20 MED ORDER — SIMVASTATIN 40 MG PO TABS: 40.0000 mg | ORAL_TABLET | Freq: Every day | ORAL | Status: DC

## 2010-09-20 NOTE — Telephone Encounter (Signed)
Pt needs a refill for med: simvastatin 40mg  .... Sent to [facility]....90-day supply..... Pt # [id number].

## 2010-09-21 ENCOUNTER — Telehealth: Payer: Self-pay | Admitting: Internal Medicine

## 2010-09-21 NOTE — Telephone Encounter (Signed)
Called [facility] #10 to  [facility] [address]

## 2010-09-21 NOTE — Telephone Encounter (Signed)
[facility] and # [facility] dalled to [facility] [address]

## 2010-09-21 NOTE — Telephone Encounter (Signed)
Triage vm----requesting a refill for Simvastatin to send to [facility]. Need also a 10 day supply sample of an alternate until mail order arrives.

## 2010-11-05 ENCOUNTER — Encounter: Payer: Self-pay | Admitting: Gastroenterology

## 2010-12-07 ENCOUNTER — Telehealth: Payer: Self-pay | Admitting: *Deleted

## 2010-12-07 NOTE — Telephone Encounter (Signed)
Dr jenkins agrees 

## 2010-12-07 NOTE — Telephone Encounter (Signed)
Pt had tick bite on inside of knee.  Has not taken it off.  Advised to take tick off, clean well, and watch for increased redness around site that increases in size , any fevers or myalgias, etc.

## 2011-03-11 ENCOUNTER — Other Ambulatory Visit (INDEPENDENT_AMBULATORY_CARE_PROVIDER_SITE_OTHER): Payer: No Typology Code available for payment source

## 2011-03-11 DIAGNOSIS — T887XXA Unspecified adverse effect of drug or medicament, initial encounter: Secondary | ICD-10-CM

## 2011-03-11 DIAGNOSIS — E785 Hyperlipidemia, unspecified: Secondary | ICD-10-CM

## 2011-03-11 LAB — HEPATIC FUNCTION PANEL
ALT: 24 U/L (ref 0–53)
AST: 28 U/L (ref 0–37)
Albumin: 3.7 g/dL (ref 3.5–5.2)
Alkaline Phosphatase: 42 U/L (ref 39–117)
Bilirubin, Direct: 0.1 mg/dL (ref 0.0–0.3)
Total Protein: 6.7 g/dL (ref 6.0–8.3)

## 2011-03-11 LAB — LIPID PANEL
HDL: 70.6 mg/dL (ref >39.00)
Total CHOL/HDL Ratio: 3

## 2011-03-18 ENCOUNTER — Ambulatory Visit: Payer: Self-pay | Admitting: Internal Medicine

## 2011-03-25 ENCOUNTER — Other Ambulatory Visit: Payer: Self-pay | Admitting: Internal Medicine

## 2011-04-04 ENCOUNTER — Telehealth: Payer: Self-pay | Admitting: Internal Medicine

## 2011-04-04 MED ORDER — SIMVASTATIN 40 MG PO TABS: 40.0000 mg | ORAL_TABLET | Freq: Every day | ORAL | Status: DC

## 2011-04-04 NOTE — Telephone Encounter (Signed)
rx sent in electronically 

## 2011-04-04 NOTE — Telephone Encounter (Signed)
Pt was sch for ov on 9/17 with pcp, but pt had to rsc due to pcp sch. Pt had to rsc for ov in Nov 2011, pt is req refill of Simvastatin to [facility] [address].

## 2011-04-15 ENCOUNTER — Ambulatory Visit: Payer: Self-pay | Admitting: Internal Medicine

## 2011-05-30 ENCOUNTER — Encounter: Payer: Self-pay | Admitting: Internal Medicine

## 2011-05-31 ENCOUNTER — Ambulatory Visit (INDEPENDENT_AMBULATORY_CARE_PROVIDER_SITE_OTHER): Payer: No Typology Code available for payment source | Admitting: Internal Medicine

## 2011-05-31 ENCOUNTER — Encounter: Payer: Self-pay | Admitting: Internal Medicine

## 2011-05-31 VITALS — BP 124/80 | HR 72 | Temp 98.2°F | Resp 16 | Ht 72.0 in | Wt 228.0 lb

## 2011-05-31 DIAGNOSIS — R030 Elevated blood-pressure reading, without diagnosis of hypertension: Secondary | ICD-10-CM

## 2011-05-31 DIAGNOSIS — T887XXA Unspecified adverse effect of drug or medicament, initial encounter: Secondary | ICD-10-CM

## 2011-05-31 DIAGNOSIS — E785 Hyperlipidemia, unspecified: Secondary | ICD-10-CM

## 2011-05-31 DIAGNOSIS — R635 Abnormal weight gain: Secondary | ICD-10-CM

## 2011-05-31 NOTE — Patient Instructions (Signed)
The patient is instructed to continue all medications as prescribed. Schedule followup with check out clerk upon leaving the clinic  

## 2011-05-31 NOTE — Progress Notes (Signed)
  Subjective:    Patient ID: Charles Villa, male    DOB: 1945-11-20, 65 y.o.   MRN: [id number]  HPI Patient is a 65 year old white male who presents for followup of hypertension Blood pressure was noted to be elevated in August. At that time his lipids were reviewed on Zocor 40 mg by mouth daily with stable results he also uses cruel oil as a supplement.  He is mild to moderate GERD for which he takes Pepcid he has history of psoriasis and uses Lotrisone topical as his only intervention for his psoriasis.     Review of Systems  Constitutional: Negative for fever and fatigue.  HENT: Negative for hearing loss, congestion, neck pain and postnasal drip.   Eyes: Negative for discharge, redness and visual disturbance.  Respiratory: Negative for cough, shortness of breath and wheezing.   Cardiovascular: Negative for leg swelling.  Gastrointestinal: Negative for abdominal pain, constipation and abdominal distention.  Genitourinary: Negative for urgency and frequency.  Musculoskeletal: Negative for joint swelling and arthralgias.  Skin: Negative for color change and rash.  Neurological: Negative for weakness and light-headedness.  Hematological: Negative for adenopathy.  Psychiatric/Behavioral: Negative for behavioral problems.       Objective:   Physical Exam  Nursing note and vitals reviewed. Constitutional: He appears well-developed and well-nourished.  HENT:  Head: Normocephalic and atraumatic.  Eyes: Conjunctivae are normal. Pupils are equal, round, and reactive to light.  Neck: Normal range of motion. Neck supple.  Cardiovascular: Normal rate and regular rhythm.   Pulmonary/Chest: Effort normal and breath sounds normal.  Abdominal: Soft. Bowel sounds are normal.          Assessment & Plan:  Elevated blood pressure in the past without much confirmation of hypertension.  We discussed weight control salt reduction and exercise as the primary intervention at this point he  will continue his simvastatin and omega-3 supplementation

## 2011-06-01 ENCOUNTER — Other Ambulatory Visit: Payer: Self-pay | Admitting: Internal Medicine

## 2011-07-17 ENCOUNTER — Other Ambulatory Visit: Payer: Self-pay | Admitting: Internal Medicine

## 2011-09-13 ENCOUNTER — Other Ambulatory Visit: Payer: Self-pay | Admitting: Internal Medicine

## 2011-09-13 MED ORDER — SIMVASTATIN 40 MG PO TABS: 40.0000 mg | ORAL_TABLET | Freq: Every day | ORAL | Status: DC

## 2011-09-13 NOTE — Telephone Encounter (Signed)
Pt need refill on simvastatin 40 mg [facility] [address]. Pt is out

## 2011-12-30 ENCOUNTER — Other Ambulatory Visit (INDEPENDENT_AMBULATORY_CARE_PROVIDER_SITE_OTHER): Payer: No Typology Code available for payment source

## 2011-12-30 DIAGNOSIS — Z0000 Encounter for general adult medical examination without abnormal findings: Secondary | ICD-10-CM

## 2011-12-30 LAB — BASIC METABOLIC PANEL
CO2: 25 mEq/L (ref 19–32)
Calcium: 8.8 mg/dL (ref 8.4–10.5)
Chloride: 103 mEq/L (ref 96–112)
Creatinine, Ser: 0.9 mg/dL (ref 0.4–1.5)
Glucose, Bld: 86 mg/dL (ref 70–99)

## 2011-12-30 LAB — POCT URINALYSIS DIPSTICK
Nitrite, UA: NEGATIVE
Protein, UA: NEGATIVE
Spec Grav, UA: 1.02
Urobilinogen, UA: 0.2

## 2011-12-30 LAB — CBC WITH DIFFERENTIAL/PLATELET
Basophils Absolute: 0 10*3/uL (ref 0.0–0.1)
Eosinophils Absolute: 0.1 10*3/uL (ref 0.0–0.7)
Hemoglobin: 15.8 g/dL (ref 13.0–17.0)
Lymphocytes Relative: 38.7 % (ref 12.0–46.0)
MCHC: 33.5 g/dL (ref 30.0–36.0)
MCV: 102.9 fl — ABNORMAL HIGH (ref 78.0–100.0)
Monocytes Absolute: 0.5 10*3/uL (ref 0.1–1.0)
Neutro Abs: 2.8 10*3/uL (ref 1.4–7.7)
Neutrophils Relative %: 50 % (ref 43.0–77.0)
RDW: 13.4 % (ref 11.5–14.6)

## 2011-12-30 LAB — PSA: PSA: 0.51 ng/mL (ref 0.10–4.00)

## 2011-12-30 LAB — LIPID PANEL
HDL: 72.2 mg/dL (ref >39.00)
Total CHOL/HDL Ratio: 3
VLDL: 11 mg/dL (ref 0.0–40.0)

## 2011-12-30 LAB — HEPATIC FUNCTION PANEL
Albumin: 3.6 g/dL (ref 3.5–5.2)
Alkaline Phosphatase: 42 U/L (ref 39–117)
Total Protein: 6.7 g/dL (ref 6.0–8.3)

## 2011-12-30 LAB — TSH: TSH: 1.41 u[IU]/mL (ref 0.35–5.50)

## 2012-01-06 ENCOUNTER — Encounter: Payer: Self-pay | Admitting: Internal Medicine

## 2012-01-06 ENCOUNTER — Ambulatory Visit (INDEPENDENT_AMBULATORY_CARE_PROVIDER_SITE_OTHER): Payer: No Typology Code available for payment source | Admitting: Internal Medicine

## 2012-01-06 VITALS — BP 150/90 | HR 72 | Temp 98.1°F | Resp 16 | Ht 72.0 in | Wt 222.0 lb

## 2012-01-06 DIAGNOSIS — Z0000 Encounter for general adult medical examination without abnormal findings: Secondary | ICD-10-CM

## 2012-01-06 DIAGNOSIS — I10 Essential (primary) hypertension: Secondary | ICD-10-CM

## 2012-01-06 MED ORDER — BISOPROLOL-HYDROCHLOROTHIAZIDE 2.5-6.25 MG PO TABS: 1.0000 | ORAL_TABLET | Freq: Every day | ORAL | Status: DC

## 2012-01-06 MED ORDER — KRILL OIL OMEGA-3 300 MG PO CAPS: 1.0000 | ORAL_CAPSULE | Freq: Every day | ORAL | Status: DC

## 2012-01-06 NOTE — Progress Notes (Signed)
  Subjective:    Patient ID: Charles Villa, male    DOB: 01/21/1946, 66 y.o.   MRN: [id number]  HPI Patient is a 66 year old F. presents for his annual examination.  He has a history of hyperlipidemia gastroesophageal reflux moderately severe psoriasis of the scalp history of chronic low back pain and a history of hemorrhoids.  Today he presents for his annual examination with a complaint of a flare of his psoriasis he states that he has not been using the lotion as directed.  He also has been under increased stress he also has a new problem essential hypertension as his blood pressure is elevated and ultimately checks confirm the elevation of blood pressure he is on no medications at this time   Review of Systems  Constitutional: Negative for fever and fatigue.  HENT: Negative for hearing loss, congestion, neck pain and postnasal drip.   Eyes: Negative for discharge, redness and visual disturbance.  Respiratory: Negative for cough, shortness of breath and wheezing.   Cardiovascular: Negative for leg swelling.  Gastrointestinal: Negative for abdominal pain, constipation and abdominal distention.  Genitourinary: Negative for urgency and frequency.  Musculoskeletal: Negative for joint swelling and arthralgias.  Skin: Negative for color change and rash.  Neurological: Negative for weakness and light-headedness.  Hematological: Negative for adenopathy.  Psychiatric/Behavioral: Negative for behavioral problems.       Objective:   Physical Exam  Nursing note and vitals reviewed. Constitutional: He is oriented to person, place, and time. He appears well-developed and well-nourished.  HENT:  Head: Normocephalic and atraumatic.  Eyes: Conjunctivae are normal. Pupils are equal, round, and reactive to light.  Neck: Normal range of motion. Neck supple.  Cardiovascular: Normal rate and regular rhythm.   Pulmonary/Chest: Effort normal and breath sounds normal.  Abdominal: Soft. Bowel  sounds are normal.  Genitourinary: Rectum normal and prostate normal.  Musculoskeletal: He exhibits edema. He exhibits no tenderness.  Neurological: He is alert and oriented to person, place, and time.  Skin: Skin is warm and dry.          Assessment & Plan:   Patient presents for yearly preventative medicine examination.   all immunizations and health maintenance protocols were reviewed with the patient and they are up to date with these protocols.   screening laboratory values were reviewed with the patient including screening of hyperlipidemia PSA renal function and hepatic function.   There medications past medical history social history problem list and allergies were reviewed in detail.   Goals were established with regard to weight loss exercise diet in compliance with medications  New onset essential hypertension we'll begin Zetia 1 by mouth daily.  Cholesterol stable on current medications  Other problems stable at this time

## 2012-01-06 NOTE — Patient Instructions (Addendum)
The patient is instructed to continue all medications as prescribed. Schedule followup with check out clerk upon leaving the clinic Start the ziac in the AM

## 2012-03-20 ENCOUNTER — Ambulatory Visit: Payer: No Typology Code available for payment source | Admitting: Internal Medicine

## 2012-03-23 ENCOUNTER — Ambulatory Visit: Payer: No Typology Code available for payment source | Admitting: Internal Medicine

## 2012-04-13 ENCOUNTER — Ambulatory Visit: Payer: No Typology Code available for payment source | Admitting: Internal Medicine

## 2012-05-29 ENCOUNTER — Telehealth: Payer: Self-pay | Admitting: Internal Medicine

## 2012-05-29 DIAGNOSIS — I10 Essential (primary) hypertension: Secondary | ICD-10-CM

## 2012-05-29 MED ORDER — BISOPROLOL-HYDROCHLOROTHIAZIDE 2.5-6.25 MG PO TABS: 1.0000 | ORAL_TABLET | Freq: Every day | ORAL | Status: DC

## 2012-05-29 NOTE — Telephone Encounter (Signed)
[facility] [address] and no he doesn't have to fast for ov in November.pt informed

## 2012-05-29 NOTE — Telephone Encounter (Signed)
Caller: [name]/Patient; Patient Name: Charles Villa; PCP: Darryll Capers (Adults only); Best Callback Phone Number: [phone].  Patient calling for (#1)  Has an appointment on 06/08/2012 patient would like to know does he need to fast for this appointmnent.  Reviewed Epic unable to determine.  PLEASE CONTACT PATIENT IF HE NEEDS FASTING LABS.  (#2)  Patient is currently taking Ziac/HCTZ for blood pressure.  He is requesting a 90 day supply of medication instead of 30 day supply. Can he get a 90 day supply.  Advised patient I would forward his request over to staff. Understanding expressed.  PLEASE CONTACT PATIENT.

## 2012-06-08 ENCOUNTER — Ambulatory Visit (INDEPENDENT_AMBULATORY_CARE_PROVIDER_SITE_OTHER): Payer: No Typology Code available for payment source | Admitting: Internal Medicine

## 2012-06-08 VITALS — BP 130/80 | HR 72 | Temp 98.2°F | Resp 16 | Ht 72.0 in | Wt 226.0 lb

## 2012-06-08 DIAGNOSIS — L408 Other psoriasis: Secondary | ICD-10-CM

## 2012-06-08 DIAGNOSIS — I10 Essential (primary) hypertension: Secondary | ICD-10-CM

## 2012-06-08 DIAGNOSIS — Z23 Encounter for immunization: Secondary | ICD-10-CM

## 2012-06-08 DIAGNOSIS — L409 Psoriasis, unspecified: Secondary | ICD-10-CM

## 2012-06-08 DIAGNOSIS — K219 Gastro-esophageal reflux disease without esophagitis: Secondary | ICD-10-CM

## 2012-06-08 MED ORDER — DESONIDE 0.05 % EX GEL: Freq: Two times a day (BID) | CUTANEOUS | Status: DC

## 2012-06-08 MED ORDER — FAMOTIDINE 40 MG PO TABS: 40.0000 mg | ORAL_TABLET | Freq: Every day | ORAL | Status: DC

## 2012-06-08 NOTE — Progress Notes (Signed)
  Subjective:    Patient ID: Charles Villa, male    DOB: [birth year], 66 y.o.   MRN: [id number]  HPI Blood pressure good. psorisis worsening but does not want to take orals     Review of Systems  Constitutional: Negative for fever and fatigue.  HENT: Negative for hearing loss, congestion, neck pain and postnasal drip.   Eyes: Negative for discharge, redness and visual disturbance.  Respiratory: Negative for cough, shortness of breath and wheezing.   Cardiovascular: Negative for leg swelling.  Gastrointestinal: Negative for abdominal pain, constipation and abdominal distention.  Genitourinary: Negative for urgency and frequency.  Musculoskeletal: Negative for joint swelling and arthralgias.  Skin: Negative for color change and rash.  Neurological: Negative for weakness and light-headedness.  Hematological: Negative for adenopathy.  Psychiatric/Behavioral: Negative for behavioral problems.   Past Medical History  Diagnosis Date  . Diverticulosis of colon   . Hyperlipidemia   . Hypertension   . Scalp psoriasis   . GERD (gastroesophageal reflux disease)     History   Social History  . Marital Status: Married    Spouse Name: N/A    Number of Children: N/A  . Years of Education: N/A   Occupational History  . Not on file.   Social History Main Topics  . Smoking status: Never Smoker   . Smokeless tobacco: Not on file  . Alcohol Use: Not on file  . Drug Use: Not on file  . Sexually Active: Not on file   Other Topics Concern  . Not on file   Social History Narrative  . No narrative on file    Past Surgical History  Procedure Date  . Tonsillectomy   . Colonoscopy w/ polypectomy     Family History  Problem Relation Age of Onset  . Cancer Father     lung  . Cancer Other     lung  . Stroke Other     No Known Allergies  Current Outpatient Prescriptions on File Prior to Visit  Medication Sig Dispense Refill  . bisoprolol-hydrochlorothiazide (ZIAC)  2.5-6.25 MG per tablet Take 1 tablet by mouth daily.  90 tablet  3  . clotrimazole-betamethasone (LOTRISONE) lotion Apply 1 application topically 2 (two) times daily.        . famotidine (PEPCID) 20 MG tablet Take 20 mg by mouth 2 (two) times daily.        [place] Lanius Omega-3 300 MG CAPS Take 1 capsule by mouth daily.      . simvastatin (ZOCOR) 40 MG tablet Take 20 mg by mouth at bedtime.        BP 130/80  Pulse 72  Temp 98.2 F (36.8 C)  Resp 16  Ht 6' (1.829 m)  Wt 226 lb (102.513 kg)  BMI 30.65 kg/m2       Objective:   Physical Exam  Nursing note and vitals reviewed. Constitutional: He appears well-developed and well-nourished.  HENT:  Head: Normocephalic and atraumatic.  Eyes: Conjunctivae normal are normal. Pupils are equal, round, and reactive to light.  Neck: Normal range of motion. Neck supple.  Cardiovascular: Normal rate and regular rhythm.   Pulmonary/Chest: Effort normal and breath sounds normal.  Abdominal: Soft. Bowel sounds are normal.   Seeing lupton for psorisis       Assessment & Plan:  Psoriasis flair He needs to use the topicals and judicious use of sun exposure HTN GERD Lipid follow up at time for CPX in June Schedule labs and OV

## 2012-08-10 ENCOUNTER — Encounter: Payer: Self-pay | Admitting: Internal Medicine

## 2012-08-10 NOTE — Patient Instructions (Signed)
The patient is instructed to continue all medications as prescribed. Schedule followup with check out clerk upon leaving the clinic  

## 2012-08-17 ENCOUNTER — Ambulatory Visit (INDEPENDENT_AMBULATORY_CARE_PROVIDER_SITE_OTHER): Payer: No Typology Code available for payment source | Admitting: Gastroenterology

## 2012-08-17 ENCOUNTER — Encounter: Payer: Self-pay | Admitting: Gastroenterology

## 2012-08-17 VITALS — BP 100/70 | HR 64 | Ht 71.0 in | Wt 229.1 lb

## 2012-08-17 DIAGNOSIS — K219 Gastro-esophageal reflux disease without esophagitis: Secondary | ICD-10-CM

## 2012-08-17 DIAGNOSIS — K602 Anal fissure, unspecified: Secondary | ICD-10-CM

## 2012-08-17 HISTORY — DX: Anal fissure, unspecified: K60.2

## 2012-08-17 MED ORDER — HYDROCORTISONE ACETATE 25 MG RE SUPP: 25.0000 mg | Freq: Two times a day (BID) | RECTAL | Status: DC

## 2012-08-17 MED ORDER — HYDROCORTISONE 2.5 % RE CREA: TOPICAL_CREAM | Freq: Two times a day (BID) | RECTAL | Status: DC

## 2012-08-17 NOTE — Assessment & Plan Note (Signed)
Generally well controlled with H2 receptor antagonist therapy.

## 2012-08-17 NOTE — Progress Notes (Signed)
History of Present Illness: Pleasant 67 year old white male referred at the request of Dr. Lovell Sheehan for evaluation of rectal soreness. For at least 6-12 months he's been complaining of rectal soreness, especially after a bowel movement, discharge and occasional bleeding. Bleeding is minimal. He denies change in bowel habit. Colonoscopy in 2007 demonstrated a small hyperplastic polyp and diverticulosis.[name] His other GI complaints is pyrosis which is fairly well-controlled with Pepcid.    Past Medical History  Diagnosis Date  . Diverticulosis of colon     2007  . Hyperlipidemia   . Hypertension   . Scalp psoriasis   . GERD (gastroesophageal reflux disease)   . Hyperplastic colon polyp     2007 Dr Arlyce Dice    Past Surgical History  Procedure Date  . Tonsillectomy   . Colonoscopy w/ polypectomy     2007 Dr Arlyce Dice   family history includes Hypertension in his mother; Lung cancer in his father; and Stroke in his mother. Current Outpatient Prescriptions  Medication Sig Dispense Refill  . bisoprolol-hydrochlorothiazide (ZIAC) 2.5-6.25 MG per tablet Take 1 tablet by mouth daily.  90 tablet  3  . clotrimazole-betamethasone (LOTRISONE) lotion Apply 1 application topically 2 (two) times daily.        [name] desonide (DESONATE) 0.05 % gel Apply topically 2 (two) times daily.  60 g  11  . famotidine (PEPCID) 40 MG tablet Take 1 tablet (40 mg total) by mouth at bedtime.  90 tablet  3  . Krill Oil Omega-3 300 MG CAPS Take 1 capsule by mouth daily.      . simvastatin (ZOCOR) 40 MG tablet Take 20 mg by mouth at bedtime.       Allergies as of 08/17/2012  . (No Known Allergies)    reports that he has never smoked. He has never used smokeless tobacco. He reports that he drinks alcohol. He reports that he does not use illicit drugs.     Review of Systems: Pertinent positive and negative review of systems were noted in the above HPI section. All other review of systems were otherwise negative.  Vital signs  were reviewed in today's medical record Physical Exam: General: Well developed , well nourished, no acute distress Head: Normocephalic and atraumatic Eyes:  sclerae anicteric, EOMI Ears: Normal auditory acuity Mouth: No deformity or lesions Neck: Supple, no masses or thyromegaly Lungs: Clear throughout to auscultation Heart: Regular rate and rhythm; no murmurs, rubs or bruits Abdomen: Soft, non tender and non distended. No masses, hepatosplenomegaly or hernias noted. Normal Bowel sounds Rectal: At the 6:00 position there is a small fissure with an associated skin tag. There are no rectal masses. Stool is Hemoccult negative. Musculoskeletal: Symmetrical with no gross deformities  Skin: No lesions on visible extremities Pulses:  Normal pulses noted Extremities: No clubbing, cyanosis, edema or deformities noted Neurological: Alert oriented x 4, grossly nonfocal Cervical Nodes:  No significant cervical adenopathy Inguinal Nodes: No significant inguinal adenopathy Psychological:  Alert and cooperative. Normal mood and affect

## 2012-08-17 NOTE — Assessment & Plan Note (Addendum)
Patient's rectal soreness and minimal bleeding is secondary to a fissure.  Recommendations #1 Anusol HC suppositories and cream #2 local rectal care #3 to consider therapy with either diltiazem ointment or rectiv if symptoms do not improve after several weeks therapy.

## 2012-08-17 NOTE — Patient Instructions (Addendum)
Hemorrhoids Hemorrhoids are enlarged (dilated) veins around the rectum. There are 2 types of hemorrhoids, and the type of hemorrhoid is determined by its location. Internal hemorrhoids occur in the veins just inside the rectum.They are usually not painful, but they may bleed.However, they may poke through to the outside and become irritated and painful. External hemorrhoids involve the veins outside the anus and can be felt as a painful swelling or hard lump near the anus.They are often itchy and may crack and bleed. Sometimes clots will form in the veins. This makes them swollen and painful. These are called thrombosed hemorrhoids. CAUSES Causes of hemorrhoids include:  Pregnancy. This increases the pressure in the hemorrhoidal veins.  Constipation.  Straining to have a bowel movement.  Obesity.  Heavy lifting or other activity that caused you to strain. TREATMENT Most of the time hemorrhoids improve in 1 to 2 weeks. However, if symptoms do not seem to be getting better or if you have a lot of rectal bleeding, your caregiver may perform a procedure to help make the hemorrhoids get smaller or remove them completely.Possible treatments include:  Rubber band ligation. A rubber band is placed at the base of the hemorrhoid to cut off the circulation.  Sclerotherapy. A chemical is injected to shrink the hemorrhoid.  Infrared light therapy. Tools are used to burn the hemorrhoid.  Hemorrhoidectomy. This is surgical removal of the hemorrhoid. HOME CARE INSTRUCTIONS   Increase fiber in your diet. Ask your caregiver about using fiber supplements.  Drink enough water and fluids to keep your urine clear or pale yellow.  Exercise regularly.  Go to the bathroom when you have the urge to have a bowel movement. Do not wait.  Avoid straining to have bowel movements.  Keep the anal area dry and clean.  Only take over-the-counter or prescription medicines for pain, discomfort, or fever as  directed by your caregiver. If your hemorrhoids are thrombosed:  Take warm sitz baths for 20 to 30 minutes, 3 to 4 times per day.  If the hemorrhoids are very tender and swollen, place ice packs on the area as tolerated. Using ice packs between sitz baths may be helpful. Fill a plastic bag with ice. Place a towel between the bag of ice and your skin.  Medicated creams and suppositories may be used or applied as directed.  Do not use a donut-shaped pillow or sit on the toilet for long periods. This increases blood pooling and pain. SEEK MEDICAL CARE IF:   You have increasing pain and swelling that is not controlled with your medicine.  You have uncontrolled bleeding.  You have difficulty or you are unable to have a bowel movement.  You have pain or inflammation outside the area of the hemorrhoids.  You have chills or an oral temperature above 102 F (38.9 C). MAKE SURE YOU:   Understand these instructions.  Will watch your condition.  Will get help right away if you are not doing well or get worse. Document Released: 07/12/2000 Document Revised: 10/07/2011 Document Reviewed: 06/25/2010 [facility] Patient Information 2013 [place], [place].   Anal Fissure, Adult An anal fissure is a small tear or crack in the skin around the anus. Bleeding from a fissure usually stops on its own within a few minutes. However, bleeding will often reoccur with each bowel movement until the crack heals.  CAUSES   Passing large, hard stools.  Frequent diarrheal stools.  Constipation.  Inflammatory bowel disease (Crohn's disease or ulcerative colitis).  Infections.  Anal  sex. SYMPTOMS   Small amounts of blood seen on your stools, on toilet paper, or in the toilet after a bowel movement.  Rectal bleeding.  Painful bowel movements.  Itching or irritation around the anus. DIAGNOSIS Your caregiver will examine the anal area. An anal fissure can usually be seen with careful inspection. A  rectal exam may be performed and a short tube (anoscope) may be used to examine the anal canal. TREATMENT   You may be instructed to take fiber supplements. These supplements can soften your stool to help make bowel movements easier.  Sitz baths may be recommended to help heal the tear. Do not use soap in the sitz baths.  A medicated cream or ointment may be prescribed to lessen discomfort. HOME CARE INSTRUCTIONS   Maintain a diet high in fruits, whole grains, and vegetables. Avoid constipating foods like bananas and dairy products.  Take sitz baths as directed by your caregiver.  Drink enough fluids to keep your urine clear or pale yellow.  Only take over-the-counter or prescription medicines for pain, discomfort, or fever as directed by your caregiver. Do not take aspirin as this may increase bleeding.  Do not use ointments containing numbing medications (anesthetics) or hydrocortisone. They could slow healing. SEEK MEDICAL CARE IF:   Your fissure is not completely healed within 3 days.  You have further bleeding.  You have a fever.  You have diarrhea mixed with blood.  You have pain.  Your problem is getting worse rather than better. MAKE SURE YOU:   Understand these instructions.  Will watch your condition.  Will get help right away if you are not doing well or get worse. Document Released: 07/15/2005 Document Revised: 10/07/2011 Document Reviewed: 12/30/2010 [facility] Patient Information 2013 [place], [place].  Medications being sent to your pharmacy

## 2012-11-15 ENCOUNTER — Other Ambulatory Visit: Payer: Self-pay | Admitting: Internal Medicine

## 2013-01-04 ENCOUNTER — Other Ambulatory Visit (INDEPENDENT_AMBULATORY_CARE_PROVIDER_SITE_OTHER): Payer: No Typology Code available for payment source

## 2013-01-04 DIAGNOSIS — Z0000 Encounter for general adult medical examination without abnormal findings: Secondary | ICD-10-CM

## 2013-01-04 LAB — BASIC METABOLIC PANEL
BUN: 13 mg/dL (ref 6–23)
Calcium: 9 mg/dL (ref 8.4–10.5)
GFR: 72.9 mL/min (ref >60.00)
Glucose, Bld: 98 mg/dL (ref 70–99)
Sodium: 139 mEq/L (ref 135–145)

## 2013-01-04 LAB — CBC WITH DIFFERENTIAL/PLATELET
Basophils Absolute: 0 10*3/uL (ref 0.0–0.1)
Basophils Relative: 0.4 % (ref 0.0–3.0)
Eosinophils Absolute: 0.1 10*3/uL (ref 0.0–0.7)
Eosinophils Relative: 1.1 % (ref 0.0–5.0)
HCT: 44.6 % (ref 39.0–52.0)
Hemoglobin: 15.5 g/dL (ref 13.0–17.0)
Lymphocytes Relative: 30.8 % (ref 12.0–46.0)
Lymphs Abs: 1.9 10*3/uL (ref 0.7–4.0)
MCHC: 34.8 g/dL (ref 30.0–36.0)
MCV: 101.6 fl — ABNORMAL HIGH (ref 78.0–100.0)
Monocytes Absolute: 0.6 10*3/uL (ref 0.1–1.0)
Monocytes Relative: 9.4 % (ref 3.0–12.0)
Neutro Abs: 3.6 10*3/uL (ref 1.4–7.7)
Neutrophils Relative %: 58.3 % (ref 43.0–77.0)
Platelets: 201 10*3/uL (ref 150.0–400.0)
RBC: 4.39 Mil/uL (ref 4.22–5.81)
RDW: 13.3 % (ref 11.5–14.6)
WBC: 6.2 10*3/uL (ref 4.5–10.5)

## 2013-01-04 LAB — POCT URINALYSIS DIPSTICK: Leukocytes, UA: NEGATIVE

## 2013-01-04 LAB — HEPATIC FUNCTION PANEL
Albumin: 3.6 g/dL (ref 3.5–5.2)
Total Bilirubin: 1 mg/dL (ref 0.3–1.2)

## 2013-01-04 LAB — LIPID PANEL
Cholesterol: 176 mg/dL (ref 0–200)
HDL: 48.5 mg/dL (ref >39.00)

## 2013-01-04 LAB — TSH: TSH: 1.13 u[IU]/mL (ref 0.35–5.50)

## 2013-01-11 ENCOUNTER — Other Ambulatory Visit: Payer: Self-pay | Admitting: Internal Medicine

## 2013-01-11 ENCOUNTER — Ambulatory Visit (INDEPENDENT_AMBULATORY_CARE_PROVIDER_SITE_OTHER): Payer: No Typology Code available for payment source | Admitting: Internal Medicine

## 2013-01-11 ENCOUNTER — Encounter: Payer: Self-pay | Admitting: Internal Medicine

## 2013-01-11 VITALS — BP 110/72 | HR 72 | Temp 98.3°F | Resp 16 | Ht 72.0 in | Wt 226.0 lb

## 2013-01-11 DIAGNOSIS — E785 Hyperlipidemia, unspecified: Secondary | ICD-10-CM

## 2013-01-11 DIAGNOSIS — Z0000 Encounter for general adult medical examination without abnormal findings: Secondary | ICD-10-CM

## 2013-01-11 NOTE — Progress Notes (Signed)
Subjective:    Patient ID: Charles Villa, male    DOB: 11/24/1945, 67 y.o.   MRN: [id number]  HPI  CPX  Review of Systems  Constitutional: Negative for fever and fatigue.  HENT: Negative for hearing loss, congestion, neck pain and postnasal drip.   Eyes: Negative for discharge, redness and visual disturbance.  Respiratory: Negative for cough, shortness of breath and wheezing.   Cardiovascular: Negative for leg swelling.  Gastrointestinal: Negative for abdominal pain, constipation and abdominal distention.  Genitourinary: Negative for urgency and frequency.  Musculoskeletal: Negative for joint swelling and arthralgias.  Skin: Negative for color change and rash.  Neurological: Negative for weakness and light-headedness.  Hematological: Negative for adenopathy.  Psychiatric/Behavioral: Negative for behavioral problems.   Past Medical History  Diagnosis Date  . Diverticulosis of colon     2007  . Hyperlipidemia   . Hypertension   . Scalp psoriasis   . GERD (gastroesophageal reflux disease)   . Hyperplastic colon polyp     2007 Dr Arlyce Dice     History   Social History  . Marital Status: Married    Spouse Name: N/A    Number of Children: 2  . Years of Education: N/A   Occupational History  . customer Merchandiser, retail    Social History Main Topics  . Smoking status: Never Smoker   . Smokeless tobacco: Never Used  . Alcohol Use: Yes     Comment: 3 per day  . Drug Use: No  . Sexually Active: Not on file   Other Topics Concern  . Not on file   Social History Narrative  . No narrative on file    Past Surgical History  Procedure Laterality Date  . Tonsillectomy    . Colonoscopy w/ polypectomy      2007 Dr Arlyce Dice    Family History  Problem Relation Age of Onset  . Lung cancer Father   . Stroke Mother   . Hypertension Mother     No Known Allergies  Current Outpatient Prescriptions on File Prior to Visit  Medication Sig Dispense Refill  .  bisoprolol-hydrochlorothiazide (ZIAC) 2.5-6.25 MG per tablet Take 1 tablet by mouth daily.  90 tablet  3  . clotrimazole-betamethasone (LOTRISONE) lotion Apply 1 application topically 2 (two) times daily.        [name] desonide (DESONATE) 0.05 % gel Apply topically 2 (two) times daily.  60 g  11  . famotidine (PEPCID) 40 MG tablet Take 1 tablet (40 mg total) by mouth at bedtime.  90 tablet  3  . hydrocortisone (ANUSOL-HC) 2.5 % rectal cream Place rectally 2 (two) times daily.  30 g  1  . Krill Oil Omega-3 300 MG CAPS Take 1 capsule by mouth daily.      . simvastatin (ZOCOR) 40 MG tablet TAKE 1 TABLET BY MOUTH AT BEDTIME  30 tablet  5   No current facility-administered medications on file prior to visit.    BP 110/72  Pulse 72  Temp(Src) 98.3 F (36.8 C)  Resp 16  Ht 6' (1.829 m)  Wt 226 lb (102.513 kg)  BMI 30.64 kg/m2       Objective:   Physical Exam  Nursing note and vitals reviewed. Constitutional: He is oriented to person, place, and time. He appears well-developed and well-nourished.  HENT:  Head: Normocephalic and atraumatic.  Eyes: Conjunctivae are normal. Pupils are equal, round, and reactive to light.  Neck: Normal range of motion. Neck supple.  Cardiovascular: Normal rate  and regular rhythm.   Pulmonary/Chest: Effort normal and breath sounds normal.  Abdominal: Soft. Bowel sounds are normal.  Genitourinary: Rectum normal and prostate normal.  Musculoskeletal: Normal range of motion. He exhibits edema.  Neurological: He is alert and oriented to person, place, and time.  Skin: Skin is warm and dry.  Psychiatric: He has a normal mood and affect. His behavior is normal.          Assessment & Plan:   Patient presents for yearly preventative medicine examination.   all immunizations and health maintenance protocols were reviewed with the patient and they are up to date with these protocols.   screening laboratory values were reviewed with the patient including  screening of hyperlipidemia PSA renal function and hepatic function.   There medications past medical history social history problem list and allergies were reviewed in detail.   Goals were established with regard to weight loss exercise diet in compliance with medications

## 2013-01-11 NOTE — Patient Instructions (Signed)

## 2013-07-12 ENCOUNTER — Ambulatory Visit (INDEPENDENT_AMBULATORY_CARE_PROVIDER_SITE_OTHER): Payer: No Typology Code available for payment source | Admitting: Internal Medicine

## 2013-07-12 ENCOUNTER — Encounter: Payer: Self-pay | Admitting: Internal Medicine

## 2013-07-12 VITALS — BP 140/80 | HR 72 | Temp 98.6°F | Resp 16 | Ht 72.0 in | Wt 230.0 lb

## 2013-07-12 DIAGNOSIS — I10 Essential (primary) hypertension: Secondary | ICD-10-CM

## 2013-07-12 DIAGNOSIS — T887XXA Unspecified adverse effect of drug or medicament, initial encounter: Secondary | ICD-10-CM

## 2013-07-12 DIAGNOSIS — E785 Hyperlipidemia, unspecified: Secondary | ICD-10-CM

## 2013-07-12 LAB — LIPID PANEL
Cholesterol: 208 mg/dL — ABNORMAL HIGH (ref 0–200)
Total CHOL/HDL Ratio: 3
Triglycerides: 68 mg/dL (ref 0.0–149.0)
VLDL: 13.6 mg/dL (ref 0.0–40.0)

## 2013-07-12 LAB — COMPREHENSIVE METABOLIC PANEL
ALT: 27 U/L (ref 0–53)
AST: 27 U/L (ref 0–37)
BUN: 18 mg/dL (ref 6–23)
CO2: 27 mEq/L (ref 19–32)
Creatinine, Ser: 1.1 mg/dL (ref 0.4–1.5)
GFR: 75.18 mL/min (ref >60.00)
Total Bilirubin: 0.6 mg/dL (ref 0.3–1.2)

## 2013-07-12 MED ORDER — BISOPROLOL-HYDROCHLOROTHIAZIDE 2.5-6.25 MG PO TABS: 1.0000 | ORAL_TABLET | Freq: Every day | ORAL | Status: DC

## 2013-07-12 NOTE — Progress Notes (Signed)
Pre visit review using our clinic review tool, if applicable. No additional management support is needed unless otherwise documented below in the visit note. 

## 2013-07-12 NOTE — Patient Instructions (Addendum)
The patient is instructed to continue all medications as prescribed. Schedule followup with check out clerk upon leaving the clinic   Use of methotrexate in psoriasis

## 2013-07-12 NOTE — Progress Notes (Signed)
Subjective:    Patient ID: Charles Villa, male    DOB: [birth year], 67 y.o.   MRN: [id number]  HPI Is a 67 year old male who presents for followup of hyperlipidemia history of gastroesophageal reflux and discussed appropriate immunizations including pneumonia vaccine and the shingles vaccine. He is followed for HTN and the patinet has been "cutting back" on the blood pressure medications He has been taking less of the zocar      Review of Systems  Constitutional: Negative for fever and fatigue.  HENT: Negative for congestion, hearing loss and postnasal drip.   Eyes: Negative for discharge, redness and visual disturbance.  Respiratory: Negative for cough, shortness of breath and wheezing.   Cardiovascular: Negative for leg swelling.  Gastrointestinal: Negative for abdominal pain, constipation and abdominal distention.  Genitourinary: Negative for urgency and frequency.  Musculoskeletal: Negative for arthralgias, joint swelling and neck pain.  Skin: Positive for rash. Negative for color change.  Neurological: Negative for weakness and light-headedness.  Hematological: Negative for adenopathy.  Psychiatric/Behavioral: Negative for behavioral problems.   Past Medical History  Diagnosis Date  . Diverticulosis of colon     2007  . Hyperlipidemia   . Hypertension   . Scalp psoriasis   . GERD (gastroesophageal reflux disease)   . Hyperplastic colon polyp     2007 Dr Arlyce Dice     History   Social History  . Marital Status: Married    Spouse Name: N/A    Number of Children: 2  . Years of Education: N/A   Occupational History  . customer Merchandiser, retail    Social History Main Topics  . Smoking status: Never Smoker   . Smokeless tobacco: Never Used  . Alcohol Use: Yes     Comment: 3 per day  . Drug Use: No  . Sexual Activity: Not on file   Other Topics Concern  . Not on file   Social History Narrative  . No narrative on file    Past Surgical History  Procedure  Laterality Date  . Tonsillectomy    . Colonoscopy w/ polypectomy      2007 Dr Arlyce Dice    Family History  Problem Relation Age of Onset  . Lung cancer Father   . Stroke Mother   . Hypertension Mother     No Known Allergies  Current Outpatient Prescriptions on File Prior to Visit  Medication Sig Dispense Refill  . clotrimazole-betamethasone (LOTRISONE) lotion Apply 1 application topically 2 (two) times daily.        [name] desonide (DESONATE) 0.05 % gel Apply topically 2 (two) times daily.  60 g  11  . famotidine (PEPCID) 40 MG tablet Take 1 tablet (40 mg total) by mouth at bedtime.  90 tablet  3  . hydrocortisone (ANUSOL-HC) 2.5 % rectal cream Place rectally 2 (two) times daily.  30 g  1  . Krill Oil Omega-3 300 MG CAPS Take 1 capsule by mouth daily.      . simvastatin (ZOCOR) 40 MG tablet TAKE 1 TABLET BY MOUTH AT BEDTIME  30 tablet  5   No current facility-administered medications on file prior to visit.    BP 140/80  Pulse 72  Temp(Src) 98.6 F (37 C)  Resp 16  Ht 6' (1.829 m)  Wt 230 lb (104.327 kg)  BMI 31.19 kg/m2        Objective:   Physical Exam  Constitutional: He appears well-developed and well-nourished.  HENT:  Head: Normocephalic and atraumatic.  Eyes:  Conjunctivae are normal. Pupils are equal, round, and reactive to light.  Neck: Normal range of motion. Neck supple.  Cardiovascular: Normal rate and regular rhythm.   Pulmonary/Chest: Effort normal and breath sounds normal.  Abdominal: Soft. Bowel sounds are normal.  Skin: Rash noted.          Assessment & Plan:  10 methotrexate another biologicals for her use and psoriasis.  Information given to the patient from [email or web address] about treatment choices if the patient desires to have a trial of methotrexate we will initiate this prior to the next visit.  Patient urged to take a whole she asked daily for blood pressure control and we'll monitor a lipid panel today to see if the reduced dose of Zocor has  been equally as effective for lowering his cholesterol to goal

## 2013-08-30 ENCOUNTER — Ambulatory Visit (INDEPENDENT_AMBULATORY_CARE_PROVIDER_SITE_OTHER): Payer: No Typology Code available for payment source | Admitting: Family Medicine

## 2013-08-30 ENCOUNTER — Encounter: Payer: Self-pay | Admitting: Family Medicine

## 2013-08-30 VITALS — BP 128/64 | HR 66 | Temp 97.5°F | Wt 237.0 lb

## 2013-08-30 DIAGNOSIS — R05 Cough: Secondary | ICD-10-CM

## 2013-08-30 DIAGNOSIS — Z23 Encounter for immunization: Secondary | ICD-10-CM | POA: Diagnosis not present

## 2013-08-30 DIAGNOSIS — R059 Cough, unspecified: Secondary | ICD-10-CM

## 2013-08-30 NOTE — Addendum Note (Signed)
Addended by: Marcina Millard on: 08/30/2013 03:02 PM   Modules accepted: Orders

## 2013-08-30 NOTE — Progress Notes (Signed)
   Subjective:    Patient ID: Charles Villa, male    DOB: 10/22/1945, 68 y.o.   MRN: [id number]  HPI Patient seen for several weeks history of cough. Nonsmoker. Developed cough about a month ago. Cough is mostly dry. No wheezing. No dyspnea. No appetite or weight changes. No hemoptysis. No pleuritic pain. No ACE inhibitor use.  He does have some GERD and takes Nexium intermittently. Has frequent postnasal drip symptoms. In consistent use of antihistamines. Tried DayQuil and Advil cold and sinus which helped his cough slightly. No history of asthma. Generally feels well.  Past Medical History  Diagnosis Date  . Diverticulosis of colon     2007  . Hyperlipidemia   . Hypertension   . Scalp psoriasis   . GERD (gastroesophageal reflux disease)   . Hyperplastic colon polyp     2007 [name]    Past Surgical History  Procedure Laterality Date  . Tonsillectomy    . Colonoscopy w/ polypectomy      2007 [name]    reports that he has never smoked. He has never used smokeless tobacco. He reports that he drinks alcohol. He reports that he does not use illicit drugs. family history includes Hypertension in his mother; Lung cancer in his father; Stroke in his mother. No Known Allergies    Review of Systems  Constitutional: Negative for fever, chills, appetite change and unexpected weight change.  Respiratory: Positive for cough. Negative for shortness of breath and wheezing.   Cardiovascular: Negative for chest pain and leg swelling.  Neurological: Negative for dizziness and syncope.       Objective:   Physical Exam  Constitutional: He appears well-developed and well-nourished.  HENT:  Right Ear: External ear normal.  Left Ear: External ear normal.  Mouth/Throat: Oropharynx is clear and moist.  Neck: Neck supple.  Cardiovascular: Normal rate.   Pulmonary/Chest: Effort normal and breath sounds normal. No respiratory distress. He has no wheezes. He has no rales.            Assessment & Plan:  Persistent cough. Differential is acute post viral cough, postnasal drip related cough, GERD related cough. He denies any red flags such as appetite or weight changes or any dyspnea or hemoptysis. Elevate head of bed 6 inches. Get back on Nexium regularly. He has samples of Nasacort to use regularly and daily use of antihistamine. Touch base if cough not improving 2-3 weeks

## 2013-08-30 NOTE — Progress Notes (Signed)
Pre visit review using our clinic review tool, if applicable. No additional management support is needed unless otherwise documented below in the visit note. 

## 2013-08-30 NOTE — Patient Instructions (Signed)
Gastroesophageal Reflux Disease, Adult Gastroesophageal reflux disease (GERD) happens when acid from your stomach flows up into the esophagus. When acid comes in contact with the esophagus, the acid causes soreness (inflammation) in the esophagus. Over time, GERD may create small holes (ulcers) in the lining of the esophagus. CAUSES   Increased body weight. This puts pressure on the stomach, making acid rise from the stomach into the esophagus.  Smoking. This increases acid production in the stomach.  Drinking alcohol. This causes decreased pressure in the lower esophageal sphincter (valve or ring of muscle between the esophagus and stomach), allowing acid from the stomach into the esophagus.  Late evening meals and a full stomach. This increases pressure and acid production in the stomach.  A malformed lower esophageal sphincter. Sometimes, no cause is found. SYMPTOMS   Burning pain in the lower part of the mid-chest behind the breastbone and in the mid-stomach area. This may occur twice a week or more often.  Trouble swallowing.  Sore throat.  Dry cough.  Asthma-like symptoms including chest tightness, shortness of breath, or wheezing. DIAGNOSIS  Your caregiver may be able to diagnose GERD based on your symptoms. In some cases, X-rays and other tests may be done to check for complications or to check the condition of your stomach and esophagus. TREATMENT  Your caregiver may recommend over-the-counter or prescription medicines to help decrease acid production. Ask your caregiver before starting or adding any new medicines.  HOME CARE INSTRUCTIONS   Change the factors that you can control. Ask your caregiver for guidance concerning weight loss, quitting smoking, and alcohol consumption.  Avoid foods and drinks that make your symptoms worse, such as:  Caffeine or alcoholic drinks.  Chocolate.  Peppermint or mint flavorings.  Garlic and onions.  Spicy foods.  Citrus fruits,  such as oranges, lemons, or limes.  Tomato-based foods such as sauce, chili, salsa, and pizza.  Fried and fatty foods.  Avoid lying down for the 3 hours prior to your bedtime or prior to taking a nap.  Eat small, frequent meals instead of large meals.  Wear loose-fitting clothing. Do not wear anything tight around your waist that causes pressure on your stomach.  Raise the head of your bed 6 to 8 inches with wood blocks to help you sleep. Extra pillows will not help.  Only take over-the-counter or prescription medicines for pain, discomfort, or fever as directed by your caregiver.  Do not take aspirin, ibuprofen, or other nonsteroidal anti-inflammatory drugs (NSAIDs). SEEK IMMEDIATE MEDICAL CARE IF:   You have pain in your arms, neck, jaw, teeth, or back.  Your pain increases or changes in intensity or duration.  You develop nausea, vomiting, or sweating (diaphoresis).  You develop shortness of breath, or you faint.  Your vomit is green, yellow, black, or looks like coffee grounds or blood.  Your stool is red, bloody, or black. These symptoms could be signs of other problems, such as heart disease, gastric bleeding, or esophageal bleeding. MAKE SURE YOU:   Understand these instructions.  Will watch your condition.  Will get help right away if you are not doing well or get worse. Document Released: 04/24/2005 Document Revised: 10/07/2011 Document Reviewed: 02/01/2011 [facility] Patient Information 2014 [place], [place].  Take Nexium consistently for the next few weeks Continue with night time use of antihistamine Use steroid nasal spray at night Elevate head of bed about 6-8 inches Let me know if cough no better in 2-3 weeks.

## 2014-01-17 ENCOUNTER — Other Ambulatory Visit: Payer: Self-pay | Admitting: Internal Medicine

## 2014-01-31 ENCOUNTER — Other Ambulatory Visit: Payer: No Typology Code available for payment source

## 2014-02-05 ENCOUNTER — Other Ambulatory Visit: Payer: Self-pay | Admitting: Internal Medicine

## 2014-02-07 ENCOUNTER — Encounter: Payer: No Typology Code available for payment source | Admitting: Internal Medicine

## 2014-03-04 ENCOUNTER — Other Ambulatory Visit: Payer: No Typology Code available for payment source

## 2014-03-07 ENCOUNTER — Other Ambulatory Visit (INDEPENDENT_AMBULATORY_CARE_PROVIDER_SITE_OTHER): Payer: PRIVATE HEALTH INSURANCE

## 2014-03-07 DIAGNOSIS — E785 Hyperlipidemia, unspecified: Secondary | ICD-10-CM

## 2014-03-07 DIAGNOSIS — Z0000 Encounter for general adult medical examination without abnormal findings: Secondary | ICD-10-CM

## 2014-03-07 LAB — CBC WITH DIFFERENTIAL/PLATELET
BASOS ABS: 0 10*3/uL (ref 0.0–0.1)
Basophils Relative: 0.5 % (ref 0.0–3.0)
EOS PCT: 1.3 % (ref 0.0–5.0)
Eosinophils Absolute: 0.1 10*3/uL (ref 0.0–0.7)
HEMATOCRIT: 43.4 % (ref 39.0–52.0)
Hemoglobin: 14.8 g/dL (ref 13.0–17.0)
LYMPHS ABS: 2.1 10*3/uL (ref 0.7–4.0)
LYMPHS PCT: 36.6 % (ref 12.0–46.0)
MCHC: 34.2 g/dL (ref 30.0–36.0)
MCV: 100.8 fl — ABNORMAL HIGH (ref 78.0–100.0)
MONOS PCT: 10.1 % (ref 3.0–12.0)
Monocytes Absolute: 0.6 10*3/uL (ref 0.1–1.0)
NEUTROS PCT: 51.5 % (ref 43.0–77.0)
Neutro Abs: 2.9 10*3/uL (ref 1.4–7.7)
PLATELETS: 175 10*3/uL (ref 150.0–400.0)
RBC: 4.31 Mil/uL (ref 4.22–5.81)
RDW: 13.4 % (ref 11.5–15.5)
WBC: 5.7 10*3/uL (ref 4.0–10.5)

## 2014-03-07 LAB — LIPID PANEL
CHOLESTEROL: 190 mg/dL (ref 0–200)
HDL: 58.9 mg/dL (ref >39.00)
LDL Cholesterol: 117 mg/dL — ABNORMAL HIGH (ref 0–99)
NONHDL: 131.1
Total CHOL/HDL Ratio: 3
Triglycerides: 71 mg/dL (ref 0.0–149.0)
VLDL: 14.2 mg/dL (ref 0.0–40.0)

## 2014-03-07 LAB — HEPATIC FUNCTION PANEL
ALBUMIN: 3.7 g/dL (ref 3.5–5.2)
ALT: 26 U/L (ref 0–53)
AST: 26 U/L (ref 0–37)
Alkaline Phosphatase: 40 U/L (ref 39–117)
Bilirubin, Direct: 0.2 mg/dL (ref 0.0–0.3)
Total Bilirubin: 0.8 mg/dL (ref 0.2–1.2)
Total Protein: 6.9 g/dL (ref 6.0–8.3)

## 2014-03-07 LAB — POCT URINALYSIS DIPSTICK
Bilirubin, UA: NEGATIVE
Blood, UA: NEGATIVE
Glucose, UA: NEGATIVE
Ketones, UA: NEGATIVE
LEUKOCYTES UA: NEGATIVE
NITRITE UA: NEGATIVE
Spec Grav, UA: 1.02
Urobilinogen, UA: 0.2
pH, UA: 7

## 2014-03-07 LAB — BASIC METABOLIC PANEL
BUN: 21 mg/dL (ref 6–23)
CO2: 23 mEq/L (ref 19–32)
Calcium: 8.9 mg/dL (ref 8.4–10.5)
Chloride: 102 mEq/L (ref 96–112)
Creatinine, Ser: 1 mg/dL (ref 0.4–1.5)
GFR: 77.59 mL/min (ref >60.00)
GLUCOSE: 97 mg/dL (ref 70–99)
Potassium: 4.1 mEq/L (ref 3.5–5.1)
SODIUM: 136 meq/L (ref 135–145)

## 2014-03-07 LAB — PSA: PSA: 0.44 ng/mL (ref 0.10–4.00)

## 2014-03-07 LAB — TSH: TSH: 1.51 u[IU]/mL (ref 0.35–4.50)

## 2014-03-11 ENCOUNTER — Ambulatory Visit (INDEPENDENT_AMBULATORY_CARE_PROVIDER_SITE_OTHER): Payer: PRIVATE HEALTH INSURANCE | Admitting: Family Medicine

## 2014-03-11 ENCOUNTER — Encounter: Payer: Self-pay | Admitting: Family Medicine

## 2014-03-11 VITALS — BP 118/80 | HR 60 | Ht 72.0 in | Wt 231.0 lb

## 2014-03-11 DIAGNOSIS — I10 Essential (primary) hypertension: Secondary | ICD-10-CM

## 2014-03-11 DIAGNOSIS — L408 Other psoriasis: Secondary | ICD-10-CM

## 2014-03-11 DIAGNOSIS — E785 Hyperlipidemia, unspecified: Secondary | ICD-10-CM

## 2014-03-11 DIAGNOSIS — Z23 Encounter for immunization: Secondary | ICD-10-CM

## 2014-03-11 NOTE — Assessment & Plan Note (Signed)
Well controlled. Continue bisoprolol-hctz.

## 2014-03-11 NOTE — Assessment & Plan Note (Signed)
Continue topical therapy. Patient has as needed follow up with Dr. Allyson Sabal but is controlled to his liking with steroids prescribed through our office.

## 2014-03-11 NOTE — Assessment & Plan Note (Signed)
LDL above goal with 20mg  simvastatin daily but patient wants to continue current dose considering risks/benefits of statins.

## 2014-03-11 NOTE — Progress Notes (Signed)
Charles Reddish, MD Phone: [phone]  Subjective:  Patient presents today to establish care. Chief complaint-noted.   Hyperlipidemia On statin: 20 mg Simvastatin. Tolerating well. Knows LDL slightly elevated taking only 1/2 tab but wants to continue current dose due to benefits/risks Recommended daily aspirin to be started.  ROS- no chest pain or shortness of breath. No myalgias  Psoriasis Goes to Dr. Allyson Sabal. Has some samples as well as Desonide to use.  ROS- no fever/chills. Denies joint aches  Hypertension BP Readings from Last 3 Encounters:  03/11/14 118/80  08/30/13 128/64  07/12/13 140/80   Home BP monitoring-no Compliant with medications-yes without side effects, bisoprolol-hctz ROS-Denies any CP, HA, SOB, blurry vision, LE edema.   The following were reviewed and entered/updated in epic: Past Medical History  Diagnosis Date  . Diverticulosis of colon     2007  . Hyperlipidemia   . Hypertension   . Scalp psoriasis   . GERD (gastroesophageal reflux disease)   . Hyperplastic colon polyp     2007 [name]    Patient Active Problem List   Diagnosis Date Noted  . Essential hypertension, benign 03/11/2014    Priority: Medium  . HYPERLIPIDEMIA 02/10/2007    Priority: Medium  . PSORIASIS 02/10/2007    Priority: Medium  . Anal fissure 08/17/2012    Priority: Low  . SQUAMOUS CELL CARCINOMA OF SKIN SITE UNSPECIFIED 08/24/2010    Priority: Low  . GERD 02/23/2007    Priority: Low  . BACK PAIN, CHRONIC 02/10/2007    Priority: Low   Past Surgical History  Procedure Laterality Date  . Tonsillectomy    . Colonoscopy w/ polypectomy      2007 [name]    Family History  Problem Relation Age of Onset  . Lung cancer Father   . Stroke Mother   . Hypertension Mother     Medications- reviewed and updated Current Outpatient Prescriptions  Medication Sig Dispense Refill  . aspirin 81 MG tablet Take 81 mg by mouth daily.      . bisoprolol-hydrochlorothiazide  (ZIAC) 2.5-6.25 MG per tablet TAKE 1 TABLET BY MOUTH ONCE DAILY  90 tablet  0  . famotidine (PEPCID) 40 MG tablet Take 1 tablet (40 mg total) by mouth at bedtime.  90 tablet  3  . Krill Oil Omega-3 300 MG CAPS Take 1 capsule by mouth daily.      . simvastatin (ZOCOR) 40 MG tablet TAKE 1 TABLET BY MOUTH AT BEDTIME  30 tablet  4  . clotrimazole-betamethasone (LOTRISONE) lotion Apply 1 application topically 2 (two) times daily.        [name] desonide (DESONATE) 0.05 % gel Apply topically 2 (two) times daily.  60 g  11   No current facility-administered medications for this visit.    Allergies-reviewed and updated No Known Allergies  History   Social History  . Marital Status: Married    Spouse Name: N/A    Number of Children: 2  . Years of Education: N/A   Occupational History  . customer Geneticist, molecular    Social History Main Topics  . Smoking status: Never Smoker   . Smokeless tobacco: Never Used  . Alcohol Use: Yes     Comment: 3 per day  . Drug Use: No  . Sexual Activity: None   Other Topics Concern  . None   Social History Narrative   Married 42 years in 2015. 2 kids, 1 grandchild that is 3.96 years old.  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       Hobbies: ride bike, ski in winter     ROS--See HPI   Objective: BP 118/80  Pulse 60  Ht 6' (1.829 m)  Wt 231 lb (104.781 kg)  BMI 31.32 kg/m2 Gen: NAD, resting comfortably in chair HEENT: Mucous membranes are moist. Oropharynx normal Neck: no thyromegaly CV: RRR no murmurs rubs or gallops Lungs: CTAB no crackles, wheeze, rhonchi Abdomen: soft/nontender/nondistended/normal bowel sounds. No rebound or guarding.  Ext: no edema, 2+ pulses Skin: warm, dry, several plaque like scaling areas related to psoriasis worse in genital area Neuro: grossly normal, moves all extremities, PERRLA  Assessment/Plan:  HYPERLIPIDEMIA LDL above goal with 20mg  simvastatin daily but patient wants  to continue current dose considering risks/benefits of statins.  PSORIASIS Continue topical therapy. Patient has as needed follow up with Dr. Allyson Sabal but is controlled to his liking with steroids prescribed through our office.   Essential hypertension, benign Well controlled. Continue bisoprolol-hctz.   HM-history of polyps but no colonoscopy reports available per my review. Clyde Lundborg, CMA will let me know what most recent report showed on timeframe for next colonoscopy. I assumed 2015 if last colonoscopy in 2010.  Immunizations given- Orders Placed This Encounter  Procedures  . Pneumococcal polysaccharide vaccine 23-valent greater than or equal to 2yo subcutaneous/IM   Meds ordered this encounter  Medications  . aspirin 81 MG tablet    Sig: Take 81 mg by mouth daily.

## 2014-03-11 NOTE — Patient Instructions (Addendum)
Cholesterol slightly high but we discussed benefits/risks of increasing to full tab of simvastatin and you ultimately decided to continue current dose.  I would start 81 mg of aspirin daily.  We need to figure out when your next colonoscopy should be.  All other labs were ok.   Health Maintenance Due  Topic Date Due  . Zostavax - call your insurance 11/09/2007  . Pneumococcal Polysaccharide- today 11/08/2012  . Influenza Vaccine - when available 02/26/2014

## 2014-03-14 ENCOUNTER — Encounter: Payer: Self-pay | Admitting: Family Medicine

## 2014-03-14 DIAGNOSIS — Z860100 Personal history of colon polyps, unspecified: Secondary | ICD-10-CM

## 2014-03-14 HISTORY — DX: Personal history of colonic polyps: Z86.010

## 2014-03-14 HISTORY — DX: Personal history of colon polyps, unspecified: Z86.0100

## 2014-03-14 NOTE — Progress Notes (Signed)
I called [facility] and they said the only one he had was in 2007, they arent sure why something is listed for 2010. The report from 2007 is in the system.

## 2014-04-08 ENCOUNTER — Other Ambulatory Visit: Payer: Self-pay | Admitting: Internal Medicine

## 2014-05-05 ENCOUNTER — Encounter: Payer: Self-pay | Admitting: Family Medicine

## 2014-05-05 ENCOUNTER — Ambulatory Visit (INDEPENDENT_AMBULATORY_CARE_PROVIDER_SITE_OTHER): Payer: PRIVATE HEALTH INSURANCE | Admitting: Family Medicine

## 2014-05-05 VITALS — BP 110/62 | Temp 98.0°F | Wt 233.0 lb

## 2014-05-05 DIAGNOSIS — R1032 Left lower quadrant pain: Secondary | ICD-10-CM

## 2014-05-05 NOTE — Progress Notes (Signed)
  Garret Reddish, MD Phone: [phone]  Subjective:   Charles Villa is a 68 y.o. year old very pleasant male patient who presents with the following:  LLQ pain 7-10 days. Pain up to 3/10 sharp focused into LLQ. Sometime mild pain into groin. Pain stable not particularly worsening. Seems to ease off when he has a bowel movement sometimes completely. Lingering pain that comes and goes with need to have BM. Mostly formed stools over last 10 days but did have a loose stool this morning. No obvious bulge or hernia. No new medications.  ROS- no fever/chills/nausea/vomiting. No diarrhea recently other than loose stool this morning.   Past Medical History- Patient Active Problem List   Diagnosis Date Noted  . Essential hypertension, benign 03/11/2014    Priority: Medium  . HYPERLIPIDEMIA 02/10/2007    Priority: Medium  . PSORIASIS 02/10/2007    Priority: Medium  . Personal history of colonic polyps 03/14/2014    Priority: Low  . Anal fissure 08/17/2012    Priority: Low  . SQUAMOUS CELL CARCINOMA OF SKIN SITE UNSPECIFIED 08/24/2010    Priority: Low  . GERD 02/23/2007    Priority: Low  . BACK PAIN, CHRONIC 02/10/2007    Priority: Low   Medications- reviewed and updated Current Outpatient Prescriptions  Medication Sig Dispense Refill  . aspirin 81 MG tablet Take 81 mg by mouth daily.      . bisoprolol-hydrochlorothiazide (ZIAC) 2.5-6.25 MG per tablet TAKE 1 TABLET BY MOUTH ONCE DAILY  90 tablet  2  . clotrimazole-betamethasone (LOTRISONE) lotion Apply 1 application topically 2 (two) times daily.        [name] desonide (DESONATE) 0.05 % gel Apply topically 2 (two) times daily.  60 g  11  . famotidine (PEPCID) 40 MG tablet Take 1 tablet (40 mg total) by mouth at bedtime.  90 tablet  3  . Krill Oil Omega-3 300 MG CAPS Take 1 capsule by mouth daily.      . simvastatin (ZOCOR) 40 MG tablet TAKE 1 TABLET BY MOUTH AT BEDTIME  30 tablet  4   No current facility-administered medications for  this visit.    Objective: BP 110/62  Temp(Src) 98 F (36.7 C)  Wt 233 lb (105.688 kg) Gen: NAD, resting comfortably on table CV: RRR no murmurs rubs or gallops Lungs: CTAB no crackles, wheeze, rhonchi Abdomen: soft/nondistended/normal bowel sounds. No rebound or guarding. Mild pain in LLQ 8 cm from umbilicus from 30 degrees with deep palpation.  Ext: trace edema bilaterally Skin: warm, dry, psoriatic plaques noted Neuro: grossly normal, moves all extremities  Assessment/Plan:  Diverticulitis (diverticulosis known on colonoscopy) vs. Constipation highest on differential for LLQ pain(as per AVS)  We opted to treat as constipation since no fevers, pain mild, pain worse until bowel movement  Use miralax/polyetheylene glycol 17g mixed with liquid (1 capful) per day  Try this for at least 10 days as long as symptoms dont worsen or you dont have fever  If that were to happen, give me a call and we can call in the antibiotics as an alternative (cipro and flagyl vs. Augmentin)

## 2014-05-05 NOTE — Patient Instructions (Signed)
Diverticulitis vs. Constipation  We opted to treat as constipation since no fevers, pain mild, pain worse until bowel movement  Use miralax/polyetheylene glycol 17g mixed with liquid (1 capful) per day  Try this for at least 10 days as long as symptoms dont worsen or you dont have fever  If that were to happen, give me a call and we can call in the antibiotics as an alternative  Health Maintenance Due  Topic Date Due  . Zostavax - call insurance 11/09/2007  . Influenza Vaccine -[facility] or [facility] 02/26/2014

## 2014-09-12 ENCOUNTER — Ambulatory Visit: Payer: PRIVATE HEALTH INSURANCE | Admitting: Family Medicine

## 2014-11-14 ENCOUNTER — Telehealth: Payer: Self-pay

## 2014-11-14 MED ORDER — SIMVASTATIN 40 MG PO TABS: 40.0000 mg | ORAL_TABLET | Freq: Every day | ORAL | Status: DC

## 2014-11-14 NOTE — Telephone Encounter (Signed)
[facility] refill request for simvastatin (ZOCOR) 40 MG tablet.

## 2014-12-14 ENCOUNTER — Telehealth: Payer: Self-pay

## 2014-12-14 NOTE — Telephone Encounter (Signed)
[id number] [place], [place] - [address]. [address]: requests 90 day supply of simvastatin (ZOCOR) 40 MG tablet

## 2014-12-15 MED ORDER — SIMVASTATIN 40 MG PO TABS: 40.0000 mg | ORAL_TABLET | Freq: Every day | ORAL | Status: DC

## 2014-12-15 NOTE — Telephone Encounter (Signed)
Medication refilled

## 2015-01-06 ENCOUNTER — Other Ambulatory Visit: Payer: Self-pay | Admitting: Family Medicine

## 2015-06-13 ENCOUNTER — Telehealth: Payer: Self-pay | Admitting: Family Medicine

## 2015-06-13 NOTE — Telephone Encounter (Signed)
Pt would like a prescription for zostavax vaccine and PNA vaccine sent to his pharmacy. Pt states he will check with insurance company later , but please send. [facility]  Pt made cpe for Jan 2017

## 2015-06-13 NOTE — Telephone Encounter (Signed)
You can send zostavax vaccine. He does not need pneumonia vaccine though. We did his closer than intended- 6 months instead of a year and the earliest i would repeat pneumovax 23 would be 5 years.

## 2015-06-13 NOTE — Telephone Encounter (Signed)
Is this ok?  It looks like pt has had both his PNA shots PREVNAR13 08/30/13 and Pneumovax23 on 03/11/14.

## 2015-06-14 ENCOUNTER — Other Ambulatory Visit: Payer: Self-pay

## 2015-06-14 MED ORDER — ZOSTER VACCINE LIVE 19400 UNT/0.65ML ~~LOC~~ SOLR: 0.6500 mL | Freq: Once | SUBCUTANEOUS | Status: DC

## 2015-06-14 NOTE — Telephone Encounter (Signed)
Rx sent in for shingles and pt notified.

## 2015-08-04 ENCOUNTER — Other Ambulatory Visit (INDEPENDENT_AMBULATORY_CARE_PROVIDER_SITE_OTHER): Payer: Self-pay

## 2015-08-04 DIAGNOSIS — Z0000 Encounter for general adult medical examination without abnormal findings: Secondary | ICD-10-CM

## 2015-08-04 LAB — LIPID PANEL
CHOL/HDL RATIO: 4
Cholesterol: 216 mg/dL — ABNORMAL HIGH (ref 0–200)
HDL: 57.7 mg/dL (ref >39.00)
LDL Cholesterol: 131 mg/dL — ABNORMAL HIGH (ref 0–99)
NONHDL: 157.85
Triglycerides: 136 mg/dL (ref 0.0–149.0)
VLDL: 27.2 mg/dL (ref 0.0–40.0)

## 2015-08-04 LAB — CBC WITH DIFFERENTIAL/PLATELET
BASOS ABS: 0.1 10*3/uL (ref 0.0–0.1)
Basophils Relative: 0.9 % (ref 0.0–3.0)
EOS ABS: 0.2 10*3/uL (ref 0.0–0.7)
Eosinophils Relative: 2.5 % (ref 0.0–5.0)
HEMATOCRIT: 47.8 % (ref 39.0–52.0)
Hemoglobin: 16.3 g/dL (ref 13.0–17.0)
LYMPHS PCT: 35.8 % (ref 12.0–46.0)
Lymphs Abs: 2.7 10*3/uL (ref 0.7–4.0)
MCHC: 34.1 g/dL (ref 30.0–36.0)
MCV: 99.4 fl (ref 78.0–100.0)
MONOS PCT: 9.6 % (ref 3.0–12.0)
Monocytes Absolute: 0.7 10*3/uL (ref 0.1–1.0)
Neutro Abs: 3.8 10*3/uL (ref 1.4–7.7)
Neutrophils Relative %: 51.2 % (ref 43.0–77.0)
PLATELETS: 189 10*3/uL (ref 150.0–400.0)
RBC: 4.81 Mil/uL (ref 4.22–5.81)
RDW: 13.1 % (ref 11.5–15.5)
WBC: 7.5 10*3/uL (ref 4.0–10.5)

## 2015-08-04 LAB — HEPATIC FUNCTION PANEL
ALBUMIN: 3.8 g/dL (ref 3.5–5.2)
ALK PHOS: 45 U/L (ref 39–117)
ALT: 23 U/L (ref 0–53)
AST: 23 U/L (ref 0–37)
BILIRUBIN DIRECT: 0.1 mg/dL (ref 0.0–0.3)
TOTAL PROTEIN: 6.9 g/dL (ref 6.0–8.3)
Total Bilirubin: 0.6 mg/dL (ref 0.2–1.2)

## 2015-08-04 LAB — BASIC METABOLIC PANEL
BUN: 15 mg/dL (ref 6–23)
CALCIUM: 9.4 mg/dL (ref 8.4–10.5)
CO2: 26 mEq/L (ref 19–32)
CREATININE: 1.06 mg/dL (ref 0.40–1.50)
Chloride: 103 mEq/L (ref 96–112)
GFR: 73.9 mL/min (ref >60.00)
Glucose, Bld: 98 mg/dL (ref 70–99)
Potassium: 4.3 mEq/L (ref 3.5–5.1)
SODIUM: 138 meq/L (ref 135–145)

## 2015-08-04 LAB — TSH: TSH: 2.82 u[IU]/mL (ref 0.35–4.50)

## 2015-08-04 LAB — POCT URINALYSIS DIPSTICK
GLUCOSE UA: NEGATIVE
Leukocytes, UA: NEGATIVE
Nitrite, UA: NEGATIVE
RBC UA: NEGATIVE
UROBILINOGEN UA: 0.2
pH, UA: 5

## 2015-08-04 LAB — PSA: PSA: 0.4 ng/mL (ref 0.10–4.00)

## 2015-08-07 ENCOUNTER — Ambulatory Visit (INDEPENDENT_AMBULATORY_CARE_PROVIDER_SITE_OTHER): Payer: BLUE CROSS/BLUE SHIELD | Admitting: Family Medicine

## 2015-08-07 ENCOUNTER — Encounter: Payer: Self-pay | Admitting: Family Medicine

## 2015-08-07 VITALS — BP 110/70 | HR 67 | Temp 97.6°F | Ht 72.0 in | Wt 236.0 lb

## 2015-08-07 DIAGNOSIS — L409 Psoriasis, unspecified: Secondary | ICD-10-CM

## 2015-08-07 DIAGNOSIS — Z1211 Encounter for screening for malignant neoplasm of colon: Secondary | ICD-10-CM

## 2015-08-07 DIAGNOSIS — I10 Essential (primary) hypertension: Secondary | ICD-10-CM | POA: Diagnosis not present

## 2015-08-07 DIAGNOSIS — Z0000 Encounter for general adult medical examination without abnormal findings: Secondary | ICD-10-CM

## 2015-08-07 DIAGNOSIS — E785 Hyperlipidemia, unspecified: Secondary | ICD-10-CM | POA: Diagnosis not present

## 2015-08-07 MED ORDER — CLOTRIMAZOLE-BETAMETHASONE 1-0.05 % EX LOTN: 1.0000 "application " | TOPICAL_LOTION | Freq: Two times a day (BID) | CUTANEOUS | Status: DC

## 2015-08-07 MED ORDER — HYDROCORTISONE 2.5 % RE CREA: 1.0000 "application " | TOPICAL_CREAM | Freq: Two times a day (BID) | RECTAL | Status: DC

## 2015-08-07 NOTE — Progress Notes (Signed)
Charles Reddish, MD Phone: [phone]  Subjective:  Patient presents today for their annual physical. Chief complaint-noted.   See problem oriented charting- ROS- full  review of systems was completed and negative except for: psoriatic rash. Irritation in gluteal crease.   The following were reviewed and entered/updated in epic: Past Medical History  Diagnosis Date  . Diverticulosis of colon     2007  . Hyperlipidemia   . Hypertension   . Scalp psoriasis   . GERD (gastroesophageal reflux disease)   . Hyperplastic colon polyp     2007 [name]    Patient Active Problem List   Diagnosis Date Noted  . Essential hypertension, benign 03/11/2014    Priority: Medium  . HYPERLIPIDEMIA 02/10/2007    Priority: Medium  . PSORIASIS 02/10/2007    Priority: Medium  . Personal history of colonic polyps 03/14/2014    Priority: Low  . Anal fissure 08/17/2012    Priority: Low  . SQUAMOUS CELL CARCINOMA OF SKIN SITE UNSPECIFIED 08/24/2010    Priority: Low  . GERD 02/23/2007    Priority: Low  . BACK PAIN, CHRONIC 02/10/2007    Priority: Low   Past Surgical History  Procedure Laterality Date  . Tonsillectomy    . Colonoscopy w/ polypectomy      2007 [name]    Family History  Problem Relation Age of Onset  . Lung cancer Father   . Stroke Mother   . Hypertension Mother     Medications- reviewed and updated Current Outpatient Prescriptions  Medication Sig Dispense Refill  . aspirin 81 MG tablet Take 81 mg by mouth daily.    . bisoprolol-hydrochlorothiazide (ZIAC) 2.5-6.25 MG per tablet TAKE 1 TABLET EVERY DAY 90 tablet 2  . clotrimazole-betamethasone (LOTRISONE) lotion Apply 1 application topically 2 (two) times daily. 30 mL 1  . desonide (DESONATE) 0.05 % gel Apply topically 2 (two) times daily. 60 g 11  . famotidine (PEPCID) 40 MG tablet Take 1 tablet (40 mg total) by mouth at bedtime. 90 tablet 3  . fluocinolone (VANOS) 0.01 % cream Apply topically 2 (two) times daily.     Astrid Villa Omega-3 300 MG CAPS Take 1 capsule by mouth daily.    . simvastatin (ZOCOR) 40 MG tablet Take 1 tablet (40 mg total) by mouth at bedtime. 90 tablet 2  . zoster vaccine live, PF, (ZOSTAVAX) 60454 UNT/0.65ML injection Inject 19,400 Units into the skin once. 1 each 0  . hydrocortisone (ANUSOL-HC) 2.5 % rectal cream Place 1 application rectally 2 (two) times daily. 30 g 1   No current facility-administered medications for this visit.    Allergies-reviewed and updated No Known Allergies  Social History   Social History  . Marital Status: Married    Spouse Name: N/A  . Number of Children: 2  . Years of Education: N/A   Occupational History  . customer Geneticist, molecular    Social History Main Topics  . Smoking status: Never Smoker   . Smokeless tobacco: Never Used  . Alcohol Use: Yes     Comment: 3 per day  . Drug Use: No  . Sexual Activity: Not Asked   Other Topics Concern  . None   Social History Narrative   Married 42 years in 2015. 2 kids, 1 grandchild that is 66.65 years old.       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       Hobbies: ride bike, ski in winter  ROS--See HPI   Objective: BP 110/70 mmHg  Pulse 67  Temp(Src) 97.6 F (36.4 C) (Oral)  Ht 6' (1.829 m)  Wt 236 lb (107.049 kg)  BMI 32.00 kg/m2 Gen: NAD, resting comfortably HEENT: Mucous membranes are moist. Oropharynx normal Neck: no thyromegaly CV: RRR no murmurs rubs or gallops Lungs: CTAB no crackles, wheeze, rhonchi Abdomen: soft/nontender/nondistended/normal bowel sounds. No rebound or guarding.  Ext: no edema Skin: warm, dry Neuro: grossly normal, moves all extremities, PERRLA  Assessment/Plan:  70 y.o. male presenting for annual physical.  Health Maintenance counseling: 1. Anticipatory guidance: Patient counseled regarding regular dental exams, eye exams, wearing seatbelts.  2. Risk factor reduction:  Advised patient of need for regular  exercise and diet rich and fruits and vegetables to reduce risk of heart attack and stroke. Weight goal 220 within a year.  3. Immunizations/screenings/ancillary studies Health Maintenance Due  Topic Date Due  . Hepatitis C Screening - plan for next labs 09/24/47  . ZOSTAVAX - declined due to finances at present 11/09/2007   4. Prostate cancer screening- low risk based off PSA and rectal   Lab Results  Component Value Date   PSA 0.40 08/04/2015   PSA 0.44 03/07/2014   PSA 0.46 01/04/2013   5. Colon cancer screening - 09/23/2005 with colon polyp noted - but was hyperplastic  08/15/2008 abstracted with no evidence in records of completion. Needs referral for colonoscopy. Placed at this time by Ailene Rud 6. Skin cancer screening- history of squamous cell . Follows with [facility]  Hypertension-  S:controlled on ziac 2.5-6.25mg   A/P: continue current rx  Hyperlipidemia- S:mild poor control on zocor 20mg  daily with LDL of 131. Also takes aspirin.  A/P:Focus on weight loss. Weight goal 220 within 6-12 months  Psoriasis- S:Reasonablycontrolled with topical therapy when using- but inconsistent use. lotrisone helps in groin. On fluocinolone for other areas A/P: refilled lorisone  Anal fissure history- ran out of proctosol hc 2.5 %. Viewed today and more irritation in gluteal crease. Advised to trial hydrocortisone cream twice a day for 2 weeks and if no improvement- seek Dr. Ledell Peoples opinion  Patient unclear if insurance covers physical. Billed as physical but would be [id number] otherwise for management of chronic conditions x 3.    6 months BP follow up Return precautions advised.   Orders Placed This Encounter  Procedures  . Ambulatory referral to Gastroenterology    Referral Priority:  Routine    Referral Type:  Consultation    Referral Reason:  Specialty Services Required    Number of Visits Requested:  1    Meds ordered this encounter  Medications  . fluocinolone  (VANOS) 0.01 % cream    Sig: Apply topically 2 (two) times daily.  . clotrimazole-betamethasone (LOTRISONE) lotion    Sig: Apply 1 application topically 2 (two) times daily.    Dispense:  30 mL    Refill:  1  . hydrocortisone (ANUSOL-HC) 2.5 % rectal cream    Sig: Place 1 application rectally 2 (two) times daily.    Dispense:  30 g    Refill:  1

## 2015-08-07 NOTE — Patient Instructions (Addendum)
We will call you within a week about your referral to gastroenterology for colonoscopy. If you do not hear within 2 weeks, give [place] a call.   For gluteal crease irritation- would try hydrocortisone twice a day for 2 weeks- if not improved discuss with Dr. Allyson Sabal.   Blood pressure looks fine  Cholesterol is too high.  Wt Readings from Last 3 Encounters:  08/07/15 236 lb (107.049 kg)  05/05/14 233 lb (105.688 kg)  03/11/14 231 lb (104.781 kg)  Let's work on getting weight back to 220 over next 6-12 months  Check in 6 months

## 2015-08-07 NOTE — Progress Notes (Signed)
Pre visit review using our clinic review tool, if applicable. No additional management support is needed unless otherwise documented below in the visit note. 

## 2015-08-18 ENCOUNTER — Encounter: Payer: Self-pay | Admitting: Gastroenterology

## 2015-10-02 ENCOUNTER — Ambulatory Visit (AMBULATORY_SURGERY_CENTER): Payer: Self-pay

## 2015-10-02 VITALS — Ht 72.0 in | Wt 236.2 lb

## 2015-10-02 DIAGNOSIS — Z86010 Personal history of colonic polyps: Secondary | ICD-10-CM

## 2015-10-02 NOTE — Progress Notes (Signed)
Per pt, no allergies to soy or egg products.Pt not taking any weight loss meds or using  O2 at home. 

## 2015-10-22 ENCOUNTER — Other Ambulatory Visit: Payer: Self-pay | Admitting: Family Medicine

## 2015-10-23 ENCOUNTER — Encounter: Payer: Self-pay | Admitting: Gastroenterology

## 2015-10-23 ENCOUNTER — Ambulatory Visit (AMBULATORY_SURGERY_CENTER): Payer: BLUE CROSS/BLUE SHIELD | Admitting: Gastroenterology

## 2015-10-23 ENCOUNTER — Encounter: Payer: BLUE CROSS/BLUE SHIELD | Admitting: Gastroenterology

## 2015-10-23 VITALS — BP 119/72 | HR 59 | Temp 98.0°F | Resp 17 | Ht 72.0 in | Wt 236.0 lb

## 2015-10-23 DIAGNOSIS — Z1211 Encounter for screening for malignant neoplasm of colon: Secondary | ICD-10-CM

## 2015-10-23 DIAGNOSIS — D120 Benign neoplasm of cecum: Secondary | ICD-10-CM | POA: Diagnosis not present

## 2015-10-23 DIAGNOSIS — D129 Benign neoplasm of anus and anal canal: Secondary | ICD-10-CM

## 2015-10-23 DIAGNOSIS — D128 Benign neoplasm of rectum: Secondary | ICD-10-CM | POA: Diagnosis not present

## 2015-10-23 MED ORDER — SODIUM CHLORIDE 0.9 % IV SOLN: 500.0000 mL | INTRAVENOUS | Status: DC

## 2015-10-23 NOTE — Progress Notes (Signed)
Called to room to assist during endoscopic procedure.  Patient ID and intended procedure confirmed with present staff. Received instructions for my participation in the procedure from the performing physician.  

## 2015-10-23 NOTE — Progress Notes (Signed)
Patient awakening,vss,report to rn 

## 2015-10-23 NOTE — Patient Instructions (Signed)
YOU HAD AN ENDOSCOPIC PROCEDURE TODAY AT [facility]:   Refer to the procedure report that was given to you for any specific questions about what was found during the examination.  If the procedure report does not answer your questions, please call your gastroenterologist to clarify.  If you requested that your care partner not be given the details of your procedure findings, then the procedure report has been included in a sealed envelope for you to review at your convenience later.  YOU SHOULD EXPECT: Some feelings of bloating in the abdomen. Passage of more gas than usual.  Walking can help get rid of the air that was put into your GI tract during the procedure and reduce the bloating. If you had a lower endoscopy (such as a colonoscopy or flexible sigmoidoscopy) you may notice spotting of blood in your stool or on the toilet paper. If you underwent a bowel prep for your procedure, you may not have a normal bowel movement for a few days.  Please Note:  You might notice some irritation and congestion in your nose or some drainage.  This is from the oxygen used during your procedure.  There is no need for concern and it should clear up in a day or so.  SYMPTOMS TO REPORT IMMEDIATELY:   Following lower endoscopy (colonoscopy or flexible sigmoidoscopy):  Excessive amounts of blood in the stool  Significant tenderness or worsening of abdominal pains  Swelling of the abdomen that is new, acute  Fever of 100F or higher    For urgent or emergent issues, a gastroenterologist can be reached at any hour by calling [phone].   DIET: Your first meal following the procedure should be a small meal and then it is ok to progress to your normal diet. Heavy or fried foods are harder to digest and may make you feel nauseous or bloated.  Likewise, meals heavy in dairy and vegetables can increase bloating.  Drink plenty of fluids but you should avoid alcoholic beverages for 24  hours.  ACTIVITY:  You should plan to take it easy for the rest of today and you should NOT DRIVE or use heavy machinery until tomorrow (because of the sedation medicines used during the test).    FOLLOW UP: Our staff will call the number listed on your records the next business day following your procedure to check on you and address any questions or concerns that you may have regarding the information given to you following your procedure. If we do not reach you, we will leave a message.  However, if you are feeling well and you are not experiencing any problems, there is no need to return our call.  We will assume that you have returned to your regular daily activities without incident.  If any biopsies were taken you will be contacted by phone or by letter within the next 1-3 weeks.  Please call us at [phone] if you have not heard about the biopsies in 3 weeks.    SIGNATURES/CONFIDENTIALITY: You and/or your care partner have signed paperwork which will be entered into your electronic medical record.  These signatures attest to the fact that that the information above on your After Visit Summary has been reviewed and is understood.  Full responsibility of the confidentiality of this discharge information lies with you and/or your care-partner.   Hold Aspirin,Ibuprofen,naproxen, or Nsaids for 5 Days, but resume reminder of medications. Information given on polyps,diverticulosis and hemorrhoids.

## 2015-10-23 NOTE — Op Note (Signed)
[place] Patient Name: Charles Villa Procedure Date: 10/23/2015 9:19 AM MRN: [id number] Endoscopist: Mallie Mussel L. Loletha Carrow , MD Age: 70 Referring MD:  Date of Birth: July 02, 1946 Gender: Male Procedure:                Colonoscopy Indications:              Screening for colorectal malignant neoplasm Medicines:                Monitored Anesthesia Care Procedure:                Pre-Anesthesia Assessment:                           - Prior to the procedure, a History and Physical                            was performed, and patient medications and                            allergies were reviewed. The patient's tolerance of                            previous anesthesia was also reviewed. The risks                            and benefits of the procedure and the sedation                            options and risks were discussed with the patient.                            All questions were answered, and informed consent                            was obtained. Anticoagulants: The patient has taken                            aspirin. It was decided not to withhold this                            medication prior to the procedure. ASA Grade                            Assessment: II - A patient with mild systemic                            disease. After reviewing the risks and benefits,                            the patient was deemed in satisfactory condition to                            undergo the procedure.  After obtaining informed consent, the colonoscope                            was passed under direct vision. Throughout the                            procedure, the patient's blood pressure, pulse, and                            oxygen saturations were monitored continuously. The                            Model CF-HQ190L [id number]) scope was introduced                            through the anus and advanced to the the cecum,             identified by appendiceal orifice and ileocecal                            valve. The colonoscopy was performed without                            difficulty. The patient tolerated the procedure                            well. The quality of the bowel preparation was                            good. The ileocecal valve, appendiceal orifice, and                            rectum were photographed. The quality of the bowel                            preparation was evaluated using the BBPS [facility]                            Bowel Preparation Scale) with scores of: Right                            Colon = 2 (minor amount of residual staining, small                            fragments of stool and/or opaque liquid, but mucosa                            seen well), Transverse Colon = 2 (minor amount of                            residual staining, small fragments of stool and/or  opaque liquid, but mucosa seen well) and Left Colon                            = 2 (minor amount of residual staining, small                            fragments of stool and/or opaque liquid, but mucosa                            seen well). The total BBPS score equals 6. The                            bowel preparation used was Miralax. Scope In: 9:30:22 AM Scope Out: 9:48:39 AM Scope Withdrawal Time: 0 hours 12 minutes 45 seconds  Total Procedure Duration: 0 hours 18 minutes 17 seconds  Findings:      The perianal and digital rectal examinations were normal.      A 6 mm polyp was found in the cecum. The polyp was sessile. The polyp       was removed with a cold snare. Resection and retrieval were complete.      A 10 mm polyp was found in the rectum. The polyp was sessile. The polyp       was removed with a hot snare. Resection and retrieval were complete.      Multiple medium-mouthed diverticula were found in the sigmoid colon and       ascending colon.      Internal  hemorrhoids were found during retroflexion. The hemorrhoids       were Grade I (internal hemorrhoids that do not prolapse).      The exam was otherwise without abnormality. Complications:            No immediate complications. Estimated Blood Loss:     Estimated blood loss: none. Impression:               - One 6 mm polyp in the cecum, removed with a cold                            snare. Resected and retrieved.                           - One 10 mm polyp in the rectum, removed with a hot                            snare. Resected and retrieved.                           - Diverticulosis in the sigmoid colon and in the                            ascending colon.                           - Internal hemorrhoids.                           - The  examination was otherwise normal. Recommendation:           - Patient has a contact number available for                            emergencies. The signs and symptoms of potential                            delayed complications were discussed with the                            patient. Return to normal activities tomorrow.                            Written discharge instructions were provided to the                            patient.                           - Resume previous diet.                           - No aspirin, ibuprofen, naproxen, or other                            non-steroidal anti-inflammatory drugs for 5 days                            after polyp removal.                           - Await pathology results.                           - Repeat colonoscopy is recommended for                            surveillance. The colonoscopy date will be                            determined after pathology results from today's                            exam become available for review. Procedure Code(s):        --- Professional ---                           [phone], Colonoscopy, flexible; with removal of                             tumor(s), polyp(s), or other lesion(s) by snare                            technique CPT copyright 2016 American Medical Association. All rights reserved. Armoni Depass L. Loletha Carrow, MD 10/23/2015 9:54:23 AM This report has been signed electronically.  Number of Addenda: 0 Referring MD:      Maryellen Pile

## 2015-10-24 ENCOUNTER — Telehealth: Payer: Self-pay

## 2015-10-24 NOTE — Telephone Encounter (Signed)
  Follow up Call-  Call back number 10/23/2015  Post procedure Call Back phone  # [phone]  Permission to leave phone message Yes     Patient questions:  Do you have a fever, pain , or abdominal swelling? No. Pain Score  0 *  Have you tolerated food without any problems? Yes.    Have you been able to return to your normal activities? Yes.    Do you have any questions about your discharge instructions: Diet   No. Medications  No. Follow up visit  No.  Do you have questions or concerns about your Care? No.  Actions: * If pain score is 4 or above: No action needed, pain <4.

## 2015-10-27 ENCOUNTER — Encounter: Payer: Self-pay | Admitting: Gastroenterology

## 2016-01-20 ENCOUNTER — Other Ambulatory Visit: Payer: Self-pay | Admitting: Family Medicine

## 2016-01-23 ENCOUNTER — Encounter: Payer: Self-pay | Admitting: Family Medicine

## 2016-01-23 ENCOUNTER — Ambulatory Visit (INDEPENDENT_AMBULATORY_CARE_PROVIDER_SITE_OTHER): Payer: BLUE CROSS/BLUE SHIELD | Admitting: Family Medicine

## 2016-01-23 VITALS — BP 118/72 | HR 67 | Temp 98.4°F | Ht 72.0 in | Wt 226.0 lb

## 2016-01-23 DIAGNOSIS — E785 Hyperlipidemia, unspecified: Secondary | ICD-10-CM

## 2016-01-23 DIAGNOSIS — F429 Obsessive-compulsive disorder, unspecified: Secondary | ICD-10-CM

## 2016-01-23 DIAGNOSIS — I10 Essential (primary) hypertension: Secondary | ICD-10-CM

## 2016-01-23 HISTORY — DX: Obsessive-compulsive disorder, unspecified: F42.9

## 2016-01-23 NOTE — Patient Instructions (Signed)
No change in medication  Great job losing 10 lbs- keep up the great work, another 10 lbs even better  Try something to eat first thing in the morning to see if that helps with twitching  Try to start exercising again- get back on the bike

## 2016-01-23 NOTE — Assessment & Plan Note (Signed)
S: controlled on ziac 2.5-6.25 BP Readings from Last 3 Encounters:  01/23/16 118/72  10/23/15 119/72  08/07/15 110/70  A/P:Continue current meds:  Has also lost 10 lbs with goal 220- Upset stomach/low appetite- now improved. Some portion size improvement. Wish weight loss could have been more lifestyle related- hopefully next 10 lbs will be

## 2016-01-23 NOTE — Progress Notes (Signed)
Pre visit review using our clinic review tool, if applicable. No additional management support is needed unless otherwise documented below in the visit note. 

## 2016-01-23 NOTE — Progress Notes (Signed)
Subjective:  Charles Villa is a 70 y.o. year old very pleasant male patient who presents for/with See problem oriented charting ROS- No chest pain or shortness of breath. No headache or blurry vision.see any ROS included in HPI as well.   Past Medical History-  Patient Active Problem List   Diagnosis Date Noted  . OCD (obsessive compulsive disorder) 01/23/2016    Priority: Medium  . Essential hypertension, benign 03/11/2014    Priority: Medium  . Hyperlipidemia 02/10/2007    Priority: Medium  . PSORIASIS 02/10/2007    Priority: Medium  . Personal history of colonic polyps 03/14/2014    Priority: Low  . Anal fissure 08/17/2012    Priority: Low  . SQUAMOUS CELL CARCINOMA OF SKIN SITE UNSPECIFIED 08/24/2010    Priority: Low  . GERD 02/23/2007    Priority: Low  . BACK PAIN, CHRONIC 02/10/2007    Priority: Low    Medications- reviewed and updated Current Outpatient Prescriptions  Medication Sig Dispense Refill  . aspirin 325 MG EC tablet Take 325 mg by mouth daily.    [name] aspirin 81 MG tablet Take 81 mg by mouth daily. Reported on 10/23/2015    . bisoprolol-hydrochlorothiazide (ZIAC) 2.5-6.25 MG tablet TAKE 1 TABLET EVERY DAY 90 tablet 3  . clotrimazole-betamethasone (LOTRISONE) lotion Apply 1 application topically 2 (two) times daily. 30 mL 1  . desonide (DESONATE) 0.05 % gel Apply topically 2 (two) times daily. 60 g 11  . famotidine (PEPCID) 40 MG tablet Take 1 tablet (40 mg total) by mouth at bedtime. (Patient taking differently: Take 40 mg by mouth as needed. ) 90 tablet 3  . fluocinolone (VANOS) 0.01 % cream Apply topically as needed.     . hydrocortisone (ANUSOL-HC) 2.5 % rectal cream Place 1 application rectally 2 (two) times daily. 30 g 1  . Krill Oil Omega-3 300 MG CAPS Take 1 capsule by mouth daily.    . Probiotic Product (PROBIOTIC DAILY PO) Take by mouth as needed.    . simvastatin (ZOCOR) 40 MG tablet TAKE 1 TABLET BY MOUTH EVERY DAY AT BEDTIME 90 tablet 3   No  current facility-administered medications for this visit.    Objective: BP 118/72 mmHg  Pulse 67  Temp(Src) 98.4 F (36.9 C) (Oral)  Ht 6' (1.829 m)  Wt 226 lb (102.513 kg)  BMI 30.64 kg/m2  SpO2 95% Gen: NAD, resting comfortably CV: RRR no murmurs rubs or gallops Lungs: CTAB no crackles, wheeze, rhonchi Abdomen: obese Ext: no edema Skin: warm, dry Neuro: grossly normal, moves all extremities  Assessment/Plan:  We also discussed mild shakiness and twitching in AM- fades after he eats usually- late breakfast usually. Advised to trial Am snack or earlier breakfast- could be blood sugar related.     Essential hypertension, benign S: controlled on ziac 2.5-6.25 BP Readings from Last 3 Encounters:  01/23/16 118/72  10/23/15 119/72  08/07/15 110/70  A/P:Continue current meds:  Has also lost 10 lbs with goal 220- Upset stomach/low appetite- now improved. Some portion size improvement. Wish weight loss could have been more lifestyle related- hopefully next 10 lbs will be  Hyperlipidemia S: suspect mild poorly controlled on simvastatin 20mg . No myalgias.  Lab Results  Component Value Date   CHOL 216* 08/04/2015   HDL 57.70 08/04/2015   LDLCALC 131* 08/04/2015   LDLDIRECT 145.3 07/12/2013   TRIG 136.0 08/04/2015   CHOLHDL 4 08/04/2015   A/P: Hope LDL can get under 130 and total under 200- hopeful if  loses another 10 lbs by CPE that this may be possible      Return in about 6 months (around 07/24/2016) for physical.  No orders of the defined types were placed in this encounter.    Meds ordered this encounter  Medications  . sertraline (ZOLOFT) 100 MG tablet    Sig: 100 mg daily. Through psychiatry    Refill:  1    Return precautions advised.  Garret Reddish, MD

## 2016-01-23 NOTE — Assessment & Plan Note (Signed)
S: suspect mild poorly controlled on simvastatin 20mg . No myalgias.  Lab Results  Component Value Date   CHOL 216* 08/04/2015   HDL 57.70 08/04/2015   LDLCALC 131* 08/04/2015   LDLDIRECT 145.3 07/12/2013   TRIG 136.0 08/04/2015   CHOLHDL 4 08/04/2015   A/P: Hope LDL can get under 130 and total under 200- hopeful if loses another 10 lbs by CPE that this may be possible

## 2016-03-29 ENCOUNTER — Telehealth: Payer: Self-pay | Admitting: Family Medicine

## 2016-03-29 NOTE — Telephone Encounter (Addendum)
Pt would like you to review his med list due to concerns with beta blockers. Pt has started having symptoms of slight hand tremors.  Pt travels a lot, but states if you need him to come in today for labs, he is available.

## 2016-03-29 NOTE — Telephone Encounter (Signed)
Beta blockers are actually treatment for some forms of tremors. If he would like to try alternate we could stop ziac and just do hydrochlorothiazide 25 mg and then have him in for BP recheck in 2 weeks or so

## 2016-04-02 ENCOUNTER — Other Ambulatory Visit: Payer: Self-pay

## 2016-04-02 MED ORDER — HYDROCHLOROTHIAZIDE 25 MG PO TABS: 25.0000 mg | ORAL_TABLET | Freq: Every day | ORAL | 3 refills | Status: DC

## 2016-04-02 NOTE — Telephone Encounter (Signed)
Spoke with patient who verbalized understanding and I scheduled him for a follow up appointment.

## 2016-04-16 ENCOUNTER — Ambulatory Visit: Payer: BLUE CROSS/BLUE SHIELD | Admitting: Family Medicine

## 2016-05-06 ENCOUNTER — Ambulatory Visit (INDEPENDENT_AMBULATORY_CARE_PROVIDER_SITE_OTHER): Payer: BLUE CROSS/BLUE SHIELD | Admitting: Family Medicine

## 2016-05-06 ENCOUNTER — Encounter: Payer: Self-pay | Admitting: Family Medicine

## 2016-05-06 VITALS — BP 128/72 | HR 96 | Temp 98.0°F | Wt 226.2 lb

## 2016-05-06 DIAGNOSIS — E785 Hyperlipidemia, unspecified: Secondary | ICD-10-CM | POA: Diagnosis not present

## 2016-05-06 DIAGNOSIS — Z23 Encounter for immunization: Secondary | ICD-10-CM | POA: Diagnosis not present

## 2016-05-06 DIAGNOSIS — R251 Tremor, unspecified: Secondary | ICD-10-CM

## 2016-05-06 DIAGNOSIS — I10 Essential (primary) hypertension: Secondary | ICD-10-CM | POA: Diagnosis not present

## 2016-05-06 NOTE — Assessment & Plan Note (Signed)
S: prior visit patient noted mild shakiness and twitching of hands in AM. Going on perhaps 4-6 months.  Today, patient updates me that glass of wine helps and it usually is when he is doing an activity and has spilt milk or had trouble with handwriting. He feels he had been more clumsy in this time frame such as bumping into door frame. Feels balance has been more of an issue. He was concerned bisoprolol was making issue worse so we switched to hctz (had advised did not think likely to help) and on exam today is first time I have noticed the tremor.  A/P: Patient states he is very worried about early parkinsons and requests referral to neurology. I have provided this today. I told patient I thought tremor was most likely essential tremor- for now he wants to remain on hctz and see about next steps with neurology though depending on wait time- I told him I could certainly call in some propranolol

## 2016-05-06 NOTE — Progress Notes (Signed)
Pre visit review using our clinic review tool, if applicable. No additional management support is needed unless otherwise documented below in the visit note. 

## 2016-05-06 NOTE — Assessment & Plan Note (Addendum)
S: asks about updating lipids today. Plan had been weight loss but weight is stable. Already on simvastatin 20mg  daily A/P: Encouraged need for healthy eating, regular exercise, weight loss. LDL 131 in January- we discussed repeating after giving weight loss a better effort this time around. Continue simvastatin for now at 20mg  Lab Results  Component Value Date   CHOL 216 (H) 08/04/2015   HDL 57.70 08/04/2015   LDLCALC 131 (H) 08/04/2015   LDLDIRECT 145.3 07/12/2013   TRIG 136.0 08/04/2015   CHOLHDL 4 08/04/2015

## 2016-05-06 NOTE — Assessment & Plan Note (Signed)
S: controlled on ziac 2.5-6.25 mg previously- now on hctz 25mg  alone. Some days into 140s at home BP Readings from Last 3 Encounters:  05/06/16 128/72  01/23/16 118/72  10/23/15 119/72  A/P:Continue current meds:  But likely with occasionally into 140s at home- we could add propanolol if needed for tremor more than naything

## 2016-05-06 NOTE — Progress Notes (Signed)
Subjective:  Charles Villa is a 70 y.o. year old very pleasant male patient who presents for/with See problem oriented charting ROS- No chest pain or shortness of breath. No headache or blurry vision. Tremor in hand. denis slowing gait .see any ROS included in HPI as well.   Past Medical History-  Patient Active Problem List   Diagnosis Date Noted  . OCD (obsessive compulsive disorder) 01/23/2016    Priority: Medium  . Essential hypertension, benign 03/11/2014    Priority: Medium  . Hyperlipidemia 02/10/2007    Priority: Medium  . PSORIASIS 02/10/2007    Priority: Medium  . Personal history of colonic polyps 03/14/2014    Priority: Low  . Anal fissure 08/17/2012    Priority: Low  . SQUAMOUS CELL CARCINOMA OF SKIN SITE UNSPECIFIED 08/24/2010    Priority: Low  . GERD 02/23/2007    Priority: Low  . BACK PAIN, CHRONIC 02/10/2007    Priority: Low  . Tremor 05/06/2016    Medications- reviewed and updated Current Outpatient Prescriptions  Medication Sig Dispense Refill  . aspirin 325 MG EC tablet Take 160 mg by mouth daily.     . clotrimazole-betamethasone (LOTRISONE) lotion Apply 1 application topically 2 (two) times daily. 30 mL 1  . desonide (DESONATE) 0.05 % gel Apply topically 2 (two) times daily. 60 g 11  . famotidine (PEPCID) 40 MG tablet Take 1 tablet (40 mg total) by mouth at bedtime. (Patient taking differently: Take 40 mg by mouth as needed. ) 90 tablet 3  . fluocinolone (VANOS) 0.01 % cream Apply topically as needed.     . hydrochlorothiazide (HYDRODIURIL) 25 MG tablet Take 1 tablet (25 mg total) by mouth daily. 90 tablet 3  . hydrocortisone (ANUSOL-HC) 2.5 % rectal cream Place 1 application rectally 2 (two) times daily. 30 g 1  . Krill Oil Omega-3 300 MG CAPS Take 1 capsule by mouth daily.    . Probiotic Product (PROBIOTIC DAILY PO) Take by mouth as needed.    . simvastatin (ZOCOR) 20 MG tablet Take 20 mg by mouth daily.     No current facility-administered  medications for this visit.     Objective: BP 128/72 (BP Location: Left Arm, Patient Position: Sitting, Cuff Size: Large)   Pulse 96   Temp 98 F (36.7 C) (Oral)   Wt 226 lb 3.2 oz (102.6 kg)   SpO2 94%   BMI 30.68 kg/m  Gen: NAD, resting comfortably CV: RRR no murmurs rubs or gallops Lungs: CTAB no crackles, wheeze, rhonchi Abdomen: soft/nontender/nondistended/normal bowel sounds. No rebound or guarding.  Ext: no edema Skin: warm, dry Neuro: CN II-XII intact, sensation and reflexes normal throughout, 5/5 muscle strength in bilateral upper and lower extremities. Normal finger to nose. Normal rapid alternating movements. No pronator drift. Normal romberg. Normal gait- not slow or shuffling. No cogwheel rigidity on my exam   Assessment/Plan:  Tremor S: prior visit patient noted mild shakiness and twitching of hands in AM. Going on perhaps 4-6 months.  Today, patient updates me that glass of wine helps and it usually is when he is doing an activity and has spilt milk or had trouble with handwriting. He feels he had been more clumsy in this time frame such as bumping into door frame. Feels balance has been more of an issue. He was concerned bisoprolol was making issue worse so we switched to hctz (had advised did not think likely to help) and on exam today is first time I have noticed the  tremor.  A/P: Patient states he is very worried about early parkinsons and requests referral to neurology. I have provided this today. I told patient I thought tremor was most likely essential tremor- for now he wants to remain on hctz and see about next steps with neurology though depending on wait time- I told him I could certainly call in some propranolol   Essential hypertension, benign S: controlled on ziac 2.5-6.25 mg previously- now on hctz 25mg  alone. Some days into 140s at home BP Readings from Last 3 Encounters:  05/06/16 128/72  01/23/16 118/72  10/23/15 119/72  A/P:Continue current meds:   But likely with occasionally into 140s at home- we could add propanolol if needed for tremor more than naything  Hyperlipidemia S: asks about updating lipids today. Plan had been weight loss but weight is stable. Already on simvastatin 20mg  daily A/P: Encouraged need for healthy eating, regular exercise, weight loss. LDL 131 in January- we discussed repeating after giving weight loss a better effort this time around. Continue simvastatin for now at 20mg  Lab Results  Component Value Date   CHOL 216 (H) 08/04/2015   HDL 57.70 08/04/2015   LDLCALC 131 (H) 08/04/2015   LDLDIRECT 145.3 07/12/2013   TRIG 136.0 08/04/2015   CHOLHDL 4 08/04/2015     physical next year  Orders Placed This Encounter  Procedures  . Flu vaccine HIGH DOSE PF  . Ambulatory referral to Neurology    Referral Priority:   Routine    Referral Type:   Consultation    Referral Reason:   Specialty Services Required    Requested Specialty:   Neurology    Number of Visits Requested:   1    Meds ordered this encounter  Medications  . simvastatin (ZOCOR) 20 MG tablet    Sig: Take 20 mg by mouth daily.    Return precautions advised.  Garret Reddish, MD

## 2016-05-06 NOTE — Patient Instructions (Signed)
We will call you within a week about your referral to neurology. If you do not hear within 2 weeks, give [place] a call.   My opinion is that this is likely essential tremor but we will evaluate you fully with neurology referral. For now, remain on HCTZ. The ziac likely does not help like other beta blockers would. You can discuss options with neurology for beta blocker vs. Other medicine such as primidone. I can also call in a beta blocker and cut down on your HCTZ if your appointment is going to take a while and you would like to trial beforehand.

## 2016-06-13 NOTE — Progress Notes (Signed)
Charles Villa was seen today in the movement disorders clinic for neurologic consultation at the request of Garret Reddish, MD.  The consultation is for the evaluation of tremor.  The records that were made available to me were reviewed.  This patient is accompanied in the office by his spouse who supplements the history.    Tremor: Yes.   , bilateral but is R hand down  How long has it been going on? 4-6 months per notes/pt and several years per wife  At rest or with activation?  activation  When is it noted the most?  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  Yes.  , perhaps his father  Affected by caffeine:  No. (1 cup coffee per day)  Affected by alcohol:  Yes.   (3-4 drinks per day - when he cut back on gin tremor was better)  Affected by stress:  May or may not  Affected by fatigue: no, tremor is worse in the AM and subsides as day goes on  Spills soup if on spoon:  May or may not but usually able to keep soup on spoon  Any other sx's: Voice: seems more raspy than in the past Sleep: sleeps "fitfully" at night  Vivid Dreams:  No. but dreams seem vivid (may be related to paxil use - been on it for 3-6 months for hoarding)  Acting out dreams:  No. Wet Pillows: No. Postural symptoms:  "not as good as it used to be but not bad"  Falls?  No. Bradykinesia symptoms: pushes off to get out of low couch/car Loss of smell:  No. Loss of taste:  No. Urinary Incontinence:  No. Difficulty Swallowing:  No. Handwriting, micrographia: No. Depression:  Yes.  , on antidepressant for depression, OCD, hoarding Memory changes:  Yes.  , short term  Hallucinations:  No.  visual distortions: No. N/V:  Some early AM nausea that he attributes to mucous in the AM Lightheaded:  No.  Syncope: No. Diplopia:  No. Dyskinesia:  No.  Neuroimaging has not previously been performed.   PREVIOUS MEDICATIONS: none to date  ALLERGIES:  No Known Allergies  CURRENT  MEDICATIONS:  Outpatient Encounter Prescriptions as of 06/17/2016  Medication Sig  . aspirin 325 MG EC tablet Take 160 mg by mouth daily.   . clotrimazole-betamethasone (LOTRISONE) lotion Apply 1 application topically 2 (two) times daily.  [name] desonide (DESONATE) 0.05 % gel Apply topically 2 (two) times daily.  . famotidine (PEPCID) 40 MG tablet Take 1 tablet (40 mg total) by mouth at bedtime. (Patient taking differently: Take 40 mg by mouth as needed. )  . fluocinolone (VANOS) 0.01 % cream Apply topically as needed.   . hydrochlorothiazide (HYDRODIURIL) 25 MG tablet Take 1 tablet (25 mg total) by mouth daily.  . hydrocortisone (ANUSOL-HC) 2.5 % rectal cream Place 1 application rectally 2 (two) times daily.  Astrid Drafts Omega-3 300 MG CAPS Take 1 capsule by mouth daily. (Patient taking differently: Take 1 capsule by mouth every other day. )  . Probiotic Product (PROBIOTIC DAILY PO) Take by mouth every other day.   . simvastatin (ZOCOR) 20 MG tablet Take 10 mg by mouth daily.    No facility-administered encounter medications on file as of 06/17/2016.     PAST MEDICAL HISTORY:   Past Medical History:  Diagnosis Date  . Diverticulosis of colon    2007  . GERD (gastroesophageal reflux disease)   . Hyperlipidemia   .  Hypertension   . Scalp psoriasis     PAST SURGICAL HISTORY:   Past Surgical History:  Procedure Laterality Date  . COLONOSCOPY W/ POLYPECTOMY     2007 [name]  . SKIN CANCER EXCISION     squamous cell CA - nose  . TONSILLECTOMY      SOCIAL HISTORY:   Social History   Social History  . Marital status: Married    Spouse name: N/A  . Number of children: 2  . Years of education: N/A   Occupational History  . customer Geneticist, molecular Unemployed   Social History Main Topics  . Smoking status: Never Smoker  . Smokeless tobacco: Never Used  . Alcohol use 12.6 oz/week    21 Standard drinks or equivalent per week     Comment: 3-4 per day (gin)  . Drug use: No    . Sexual activity: Not on file   Other Topics Concern  . Not on file   Social History Narrative   Married 42 years in 2015. 2 kids, 1 grandchild that is 47.93 years old.       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       Hobbies: ride bike, ski in winter     FAMILY HISTORY:   Family Status  Relation Status  . Father Deceased  . Mother Deceased  . Son Alive  . Son Alive    ROS:  Some DOE.  No lateralizing weakness or paresthesias.  Wife states that son mentioned personality change.  Sees Dr. Clovis Pu.  A complete 10 system review of systems was obtained and was unremarkable apart from what is mentioned above.  PHYSICAL EXAMINATION:    VITALS:   Vitals:   06/17/16 0915  BP: 124/80  Pulse: 80  Weight: 225 lb (102.1 kg)  Height: 6' (1.829 m)    GEN:  The patient appears stated age and is in NAD. HEENT:  Normocephalic, atraumatic.  The mucous membranes are moist. The superficial temporal arteries are without ropiness or tenderness. CV:  RRR Lungs:  CTAB Neck/HEME:  There are no carotid bruits bilaterally.  Neurological examination:  Orientation: The patient is alert and oriented x3. Fund of knowledge is appropriate.  Recent and remote memory are intact.  Attention and concentration are normal.    Able to name objects and repeat phrases. Cranial nerves: There is good facial symmetry. Pupils are equal round and reactive to light bilaterally. Fundoscopic exam reveals clear margins bilaterally. Extraocular muscles are intact. The visual fields are full to confrontational testing. The speech is fluent and clear. Soft palate rises symmetrically and there is no tongue deviation. Hearing is intact to conversational tone. Sensation: Sensation is intact to light and pinprick throughout (facial, trunk, extremities). Vibration is intact at the bilateral big toe but decreased. There is no extinction with double simultaneous stimulation. There is no sensory  dermatomal level identified. Motor: Strength is 5/5 in the bilateral upper and lower extremities.   Shoulder shrug is equal and symmetric.  There is no pronator drift. Deep tendon reflexes: Deep tendon reflexes are 2/4 at the bilateral biceps, triceps, brachioradialis, 3/4 at the bilateral patella and 1/4 at the bilateral achilles. Plantar responses are downgoing bilaterally.  Movement examination: Tone: The patient had trouble relaxing.  There may have been minimal increased tone in the LUE Abnormal movements: There is a postural tremor that increases with intention bilaterally, L more than right.  A rest tremor can be felt bilaterally,  L more than R. Coordination:  There is decremation with RAM's, more in the LUE than the RUE Gait and Station: The patient has no difficulty arising out of a deep-seated chair without the use of the hands. The patient's stride length is normal with just slight decrease in arm swing on the L.  Some trouble ambulating in a tandem fashion.  Able to stand in Romberg position.  Able to heel/toe walk.  LABS    Chemistry      Component Value Date/Time   NA 138 08/04/2015 0903   K 4.3 08/04/2015 0903   CL 103 08/04/2015 0903   CO2 26 08/04/2015 0903   BUN 15 08/04/2015 0903   CREATININE 1.06 08/04/2015 0903      Component Value Date/Time   CALCIUM 9.4 08/04/2015 0903   ALKPHOS 45 08/04/2015 0903   AST 23 08/04/2015 0903   ALT 23 08/04/2015 0903   BILITOT 0.6 08/04/2015 0903     Lab Results  Component Value Date   TSH 2.82 08/04/2015   No results found for: VITAMINB12   ASSESSMENT/PLAN:  1.  Tremor  -While the patient most certainly does have tremor, it is a confusing picture, primarily because of the amount of alcohol he drinks.  He reports that he wakes up with tremor, and then it is gone much of the day (presumably because of alcohol).  However, he also has some mild bradykinesia on the left side of the body.  He does not meet the [place] brain bank  criteria for the diagnosis of Parkinson's disease, but I think that we should watch him closely.  I will do an MRI of the brain, and I told him I expect to see some small vessel disease and perhaps atrophy (maybe even some cerebellar atrophy from alcohol).  I just want to make sure we are not missing anything else.  We will get lab work as well.  I will plan on following him up in 6 months.  In the meantime, I talked to him about the importance of weaning his alcohol use under controlled supervision.  He sees Dr. Clovis Pu and I told him he needed to be honest with Dr. Clovis Pu about the amount he drinks so that advice can be given about how to wean the alcohol.  Talked to him about not discontinuing the alcohol "cold [place]" as he could have a seizure.  Understanding was expressed.  Much greater than 50% of this 60 minute visit was spent in counseling with the patient and his wife.  Cc:  Garret Reddish, MD

## 2016-06-17 ENCOUNTER — Other Ambulatory Visit: Payer: BLUE CROSS/BLUE SHIELD

## 2016-06-17 ENCOUNTER — Encounter: Payer: Self-pay | Admitting: Neurology

## 2016-06-17 ENCOUNTER — Ambulatory Visit (INDEPENDENT_AMBULATORY_CARE_PROVIDER_SITE_OTHER): Payer: BLUE CROSS/BLUE SHIELD | Admitting: Neurology

## 2016-06-17 VITALS — BP 124/80 | HR 80 | Ht 72.0 in | Wt 225.0 lb

## 2016-06-17 DIAGNOSIS — R292 Abnormal reflex: Secondary | ICD-10-CM | POA: Diagnosis not present

## 2016-06-17 DIAGNOSIS — F1099 Alcohol use, unspecified with unspecified alcohol-induced disorder: Secondary | ICD-10-CM | POA: Diagnosis not present

## 2016-06-17 DIAGNOSIS — IMO0002 Reserved for concepts with insufficient information to code with codable children: Secondary | ICD-10-CM

## 2016-06-17 DIAGNOSIS — R251 Tremor, unspecified: Secondary | ICD-10-CM

## 2016-06-17 LAB — COMPREHENSIVE METABOLIC PANEL
ALK PHOS: 43 U/L (ref 40–115)
ALT: 33 U/L (ref 9–46)
AST: 42 U/L — AB (ref 10–35)
Albumin: 3.8 g/dL (ref 3.6–5.1)
BILIRUBIN TOTAL: 0.6 mg/dL (ref 0.2–1.2)
BUN: 13 mg/dL (ref 7–25)
CO2: 26 mmol/L (ref 20–31)
CREATININE: 1.03 mg/dL (ref 0.70–1.25)
Calcium: 9.1 mg/dL (ref 8.6–10.3)
Chloride: 103 mmol/L (ref 98–110)
GLUCOSE: 112 mg/dL — AB (ref 65–99)
Potassium: 4.6 mmol/L (ref 3.5–5.3)
SODIUM: 137 mmol/L (ref 135–146)
TOTAL PROTEIN: 7 g/dL (ref 6.1–8.1)

## 2016-06-17 LAB — VITAMIN B12: VITAMIN B 12: 406 pg/mL (ref 200–1100)

## 2016-06-17 LAB — TSH: TSH: 1.39 m[IU]/L (ref 0.40–4.50)

## 2016-06-17 NOTE — Patient Instructions (Signed)
1. We have sent a referral to  Imaging for your MRI and they will call you directly to schedule your appt. They are located at [address] If you need to contact them directly please call [phone].  2. Your provider has requested that you have labwork completed today. Please go to [facility] ([id number]) on the second floor of this building before leaving the office today. You do not need to check in. If you are not called within 15 minutes please check with the front desk.   

## 2016-06-17 NOTE — Progress Notes (Signed)
Note sent to Dr. Clovis Pu and Dr. Yong Channel.

## 2016-06-24 LAB — VITAMIN B1

## 2016-06-24 NOTE — Progress Notes (Signed)
Let pt/wife know that he needs to be on thiamine supplement of 100mg  daily.  His thiamine is low (often seen in chronic alcoholics)

## 2016-06-25 ENCOUNTER — Telehealth: Payer: Self-pay | Admitting: Neurology

## 2016-06-25 NOTE — Telephone Encounter (Signed)
Left message on machine for patient to call back.

## 2016-06-26 NOTE — Telephone Encounter (Signed)
Notes Recorded by Eustace Quail Tat, DO on 06/24/2016 at 2:11 PM EST Let pt/wife know that he needs to be on thiamine supplement of 100mg  daily. His thiamine is low (often seen in chronic alcoholics)  Left another message on machine for patient to call back.

## 2016-06-26 NOTE — Telephone Encounter (Signed)
Patient called back and I made him and wife aware.

## 2016-07-01 ENCOUNTER — Ambulatory Visit
Admission: RE | Admit: 2016-07-01 | Discharge: 2016-07-01 | Disposition: A | Discharge status: Home / Self Care | Payer: BLUE CROSS/BLUE SHIELD | Source: Ambulatory Visit | Attending: Neurology | Admitting: Neurology

## 2016-07-01 DIAGNOSIS — R292 Abnormal reflex: Secondary | ICD-10-CM

## 2016-07-01 DIAGNOSIS — R251 Tremor, unspecified: Secondary | ICD-10-CM

## 2016-07-01 DIAGNOSIS — IMO0002 Reserved for concepts with insufficient information to code with codable children: Secondary | ICD-10-CM

## 2016-07-02 ENCOUNTER — Telehealth: Payer: Self-pay | Admitting: Neurology

## 2016-07-02 NOTE — Telephone Encounter (Signed)
Patient made aware nothing new seen on MR.

## 2016-07-02 NOTE — Telephone Encounter (Signed)
-----   Message from [place], DO sent at 07/01/2016  4:17 PM EST ----- Reviewed and agree.  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  No significant small vessel disease.  Mild atrophy.  Luvenia Starch, you can let pt know results.  Nothing new on examination.

## 2016-07-15 ENCOUNTER — Encounter: Payer: Self-pay | Admitting: Family Medicine

## 2016-07-15 ENCOUNTER — Ambulatory Visit (INDEPENDENT_AMBULATORY_CARE_PROVIDER_SITE_OTHER): Payer: BLUE CROSS/BLUE SHIELD | Admitting: Family Medicine

## 2016-07-15 DIAGNOSIS — E785 Hyperlipidemia, unspecified: Secondary | ICD-10-CM

## 2016-07-15 DIAGNOSIS — I10 Essential (primary) hypertension: Secondary | ICD-10-CM

## 2016-07-15 DIAGNOSIS — R251 Tremor, unspecified: Secondary | ICD-10-CM | POA: Diagnosis not present

## 2016-07-15 MED ORDER — SIMVASTATIN 20 MG PO TABS: 20.0000 mg | ORAL_TABLET | Freq: Every day | ORAL | 3 refills | Status: DC

## 2016-07-15 NOTE — Assessment & Plan Note (Signed)
S: controlled on hctz 25mg .  BP Readings from Last 3 Encounters:  07/15/16 134/88  06/17/16 124/80  05/06/16 128/72  A/P:Continue current medications. Defer decision on meds for tremor to neurology

## 2016-07-15 NOTE — Patient Instructions (Addendum)
3-6 months for physical  Increase simvastatin to 20mg  (take full tab)  Work on weight loss- goal 10 lbs by follow up by physical.    Roselyn Reef will sign you up for mychart.

## 2016-07-15 NOTE — Assessment & Plan Note (Signed)
S: Alcohol use now down to 1 or 2 was 2-4. Tremor better with less alcohol and addition of thiamine. 6 month follow up with neurology.  A/P: we reviewed his prior MRI together. Discussed with prior infarct- push for tighter lipid control, he is in agreement. Tremor doing better as noted above and has follow up planned with Dr. Carles Collet.

## 2016-07-15 NOTE — Progress Notes (Signed)
Subjective:  Charles Villa is a 70 y.o. year old very pleasant male patient who presents for/with See problem oriented charting ROS- No chest pain or shortness of breath. No headache or blurry vision. continued tremor but doing better.    Past Medical History-  Patient Active Problem List   Diagnosis Date Noted  . OCD (obsessive compulsive disorder) 01/23/2016    Priority: Medium  . Essential hypertension, benign 03/11/2014    Priority: Medium  . Hyperlipidemia 02/10/2007    Priority: Medium  . PSORIASIS 02/10/2007    Priority: Medium  . Personal history of colonic polyps 03/14/2014    Priority: Low  . Anal fissure 08/17/2012    Priority: Low  . SQUAMOUS CELL CARCINOMA OF SKIN SITE UNSPECIFIED 08/24/2010    Priority: Low  . GERD 02/23/2007    Priority: Low  . BACK PAIN, CHRONIC 02/10/2007    Priority: Low  . Tremor 05/06/2016    Medications- reviewed and updated Current Outpatient Prescriptions  Medication Sig Dispense Refill  . aspirin 325 MG EC tablet Take 160 mg by mouth daily.     . clotrimazole-betamethasone (LOTRISONE) lotion Apply 1 application topically 2 (two) times daily. 30 mL 1  . desonide (DESONATE) 0.05 % gel Apply topically 2 (two) times daily. 60 g 11  . famotidine (PEPCID) 40 MG tablet Take 1 tablet (40 mg total) by mouth at bedtime. (Patient taking differently: Take 40 mg by mouth as needed. ) 90 tablet 3  . fluocinolone (VANOS) 0.01 % cream Apply topically as needed.     . hydrochlorothiazide (HYDRODIURIL) 25 MG tablet Take 1 tablet (25 mg total) by mouth daily. 90 tablet 3  . hydrocortisone (ANUSOL-HC) 2.5 % rectal cream Place 1 application rectally 2 (two) times daily. 30 g 1  . Krill Oil Omega-3 300 MG CAPS Take 1 capsule by mouth daily. (Patient taking differently: Take 1 capsule by mouth every other day. )    . Probiotic Product (PROBIOTIC DAILY PO) Take by mouth every other day.     . simvastatin (ZOCOR) 20 MG tablet Take 1 tablet (20 mg total) by  mouth daily. 90 tablet 3  . PARoxetine (PAXIL) 20 MG tablet Take 1 tablet by mouth at bedtime.  1   No current facility-administered medications for this visit.     Objective: BP 110/78   Pulse 90   Temp 98.9 F (37.2 C) (Oral)   Ht 6' (1.829 m)   Wt 225 lb (102.1 kg)   SpO2 97%   BMI 30.52 kg/m  Gen: NAD, resting comfortably CV: RRR no murmurs rubs or gallops Lungs: CTAB no crackles, wheeze, rhonchi  Ext: no edema Skin: warm, dry, no rash Neuro: tremor noted in hands but not at rest  Assessment/Plan:  Hyperlipidemia S: poorly controlled on simvastatin 10mg  (actually only takes half of 20mg ). No myalgias. Plan was for weight loss then repeat LDL with goal under 100. He has not lost any weight Lab Results  Component Value Date   CHOL 216 (H) 08/04/2015   HDL 57.70 08/04/2015   LDLCALC 131 (H) 08/04/2015   LDLDIRECT 145.3 07/12/2013   TRIG 136.0 08/04/2015   CHOLHDL 4 08/04/2015   A/P: will work on weight loss, titrate to 20mg  simvastatin. Follow up 3-6 months  Essential hypertension, benign S: controlled on hctz 25mg .  BP Readings from Last 3 Encounters:  07/15/16 134/88  06/17/16 124/80  05/06/16 128/72  A/P:Continue current medications. Defer decision on meds for tremor to neurology  Tremor  S: Alcohol use now down to 1 or 2 was 2-4. Tremor better with less alcohol and addition of thiamine. 6 month follow up with neurology.  A/P: we reviewed his prior MRI together. Discussed with prior infarct- push for tighter lipid control, he is in agreement. Tremor doing better as noted above and has follow up planned with Dr. Carles Collet. Encouraged wean to off alcohol - he is planning on targetting 1 drink every other day  3-6 month CPE  Psych meds through psychiatry Meds ordered this encounter  Medications  . PARoxetine (PAXIL) 20 MG tablet    Sig: Take 1 tablet by mouth at bedtime.    Refill:  1  . simvastatin (ZOCOR) 20 MG tablet    Sig: Take 1 tablet (20 mg total) by mouth  daily.    Dispense:  90 tablet    Refill:  3    Return precautions advised.  Garret Reddish, MD

## 2016-07-15 NOTE — Assessment & Plan Note (Signed)
S: poorly controlled on simvastatin 10mg  (actually only takes half of 20mg ). No myalgias. Plan was for weight loss then repeat LDL with goal under 100. He has not lost any weight Lab Results  Component Value Date   CHOL 216 (H) 08/04/2015   HDL 57.70 08/04/2015   LDLCALC 131 (H) 08/04/2015   LDLDIRECT 145.3 07/12/2013   TRIG 136.0 08/04/2015   CHOLHDL 4 08/04/2015   A/P: will work on weight loss, titrate to 20mg  simvastatin. Follow up 3-6 months

## 2016-07-30 ENCOUNTER — Telehealth: Payer: Self-pay | Admitting: Family Medicine

## 2016-07-30 NOTE — Telephone Encounter (Signed)
May send in ziac 2.5-6.25 to be taken daily #90 with 3 refills. He had wanted to stop this previously and opted for hctz 25mg . Obviously take hctz 25mg  off his list

## 2016-07-30 NOTE — Telephone Encounter (Signed)
Pt state that the current blood pressure medication is not working for him and would like to go back on the one that he was on previously.

## 2016-08-01 MED ORDER — BISOPROLOL-HYDROCHLOROTHIAZIDE 2.5-6.25 MG PO TABS: 1.0000 | ORAL_TABLET | Freq: Every day | ORAL | 3 refills | Status: DC

## 2016-08-01 NOTE — Telephone Encounter (Signed)
Spoke to pt, told him Rx for Bisoprolol-HCTZ was sent to pharmacy, take one tablet daily and stop HCTZ. Pt verbalized understanding.

## 2016-09-11 DIAGNOSIS — R69 Illness, unspecified: Secondary | ICD-10-CM | POA: Diagnosis not present

## 2016-09-26 ENCOUNTER — Encounter: Payer: Self-pay | Admitting: Family Medicine

## 2016-09-26 ENCOUNTER — Ambulatory Visit (INDEPENDENT_AMBULATORY_CARE_PROVIDER_SITE_OTHER): Payer: Medicare HMO | Admitting: Family Medicine

## 2016-09-26 VITALS — BP 102/76 | HR 72 | Temp 98.0°F | Wt 231.6 lb

## 2016-09-26 DIAGNOSIS — L03119 Cellulitis of unspecified part of limb: Secondary | ICD-10-CM

## 2016-09-26 MED ORDER — CEPHALEXIN 500 MG PO CAPS: 500.0000 mg | ORAL_CAPSULE | Freq: Three times a day (TID) | ORAL | 0 refills | Status: DC

## 2016-09-26 NOTE — Progress Notes (Signed)
Subjective:  Charles Villa is a 71 y.o. year old very pleasant male patient who presents for/with See problem oriented charting ROS- no fever, chills, nausea, vomiting. Does have expanding redness on the foot   Past Medical History-  Patient Active Problem List   Diagnosis Date Noted  . OCD (obsessive compulsive disorder) 01/23/2016    Priority: Medium  . Essential hypertension, benign 03/11/2014    Priority: Medium  . Hyperlipidemia 02/10/2007    Priority: Medium  . PSORIASIS 02/10/2007    Priority: Medium  . Personal history of colonic polyps 03/14/2014    Priority: Low  . Anal fissure 08/17/2012    Priority: Low  . SQUAMOUS CELL CARCINOMA OF SKIN SITE UNSPECIFIED 08/24/2010    Priority: Low  . GERD 02/23/2007    Priority: Low  . BACK PAIN, CHRONIC 02/10/2007    Priority: Low  . Tremor 05/06/2016    Medications- reviewed and updated Current Outpatient Prescriptions  Medication Sig Dispense Refill  . aspirin 325 MG EC tablet Take 160 mg by mouth daily.     . bisoprolol-hydrochlorothiazide (ZIAC) 2.5-6.25 MG tablet Take 1 tablet by mouth daily. 90 tablet 3  . clotrimazole-betamethasone (LOTRISONE) lotion Apply 1 application topically 2 (two) times daily. (Patient taking differently: Apply 1 application topically as needed. ) 30 mL 1  . desonide (DESONATE) 0.05 % gel Apply topically 2 (two) times daily. (Patient taking differently: Apply topically as needed. ) 60 g 11  . famotidine (PEPCID) 40 MG tablet Take 1 tablet (40 mg total) by mouth at bedtime. (Patient taking differently: Take 40 mg by mouth as needed. ) 90 tablet 3  . fluocinolone (VANOS) 0.01 % cream Apply topically as needed.     . hydrocortisone (ANUSOL-HC) 2.5 % rectal cream Place 1 application rectally 2 (two) times daily. (Patient taking differently: Place 1 application rectally as needed. ) 30 g 1  . Krill Oil Omega-3 300 MG CAPS Take 1 capsule by mouth daily. (Patient taking differently: Take 1 capsule by  mouth every other day. )    . PARoxetine (PAXIL) 20 MG tablet Take 1 tablet by mouth at bedtime.  1  . Probiotic Product (PROBIOTIC DAILY PO) Take by mouth every other day.     . simvastatin (ZOCOR) 20 MG tablet Take 1 tablet (20 mg total) by mouth daily. 90 tablet 3   No current facility-administered medications for this visit.     Objective: BP 102/76 (BP Location: Left Arm, Patient Position: Sitting, Cuff Size: Large)   Pulse 72   Temp 98 F (36.7 C) (Oral)   Wt 231 lb 9.6 oz (105.1 kg)   SpO2 95%   BMI 31.41 kg/m  Gen: NAD, resting comfortably, well appearing CV: RRR no murmurs rubs or gallops Lungs: CTAB no crackles, wheeze, rhonchi Ext: no edema On right foot on bunionette there is 4-5 mm blister. Around this area there is erythema and warmth extending up to about midfoot.  Skin: warm, dry, no rash otherwise but does have some dry skin on toes. 5th toenail appears largely normal  Assessment/Plan:  Cellulitis of foot S: Sunday felt some right pinky toe pain near nail and clipped toenail. Also noted a blister on the foot over his bunionette on left foot. He peeled blister off. Then continued to ski for 3 more days. During this time, expanding redness was noted going up to the forefoot along with warmth. He has felt well otherwise- no fever or nausea. Blistered area is not worsening in  size.  A/P: appears to have local cellulitis- will cover with keflex. If not improving by Monday or symptoms worsen would advise repeat evaluation with a physician. Suspect blister may take longer to heal. Advised trying to leave open to air if able or wear loose fitting shooes  Meds ordered this encounter  Medications  . cephALEXin (KEFLEX) 500 MG capsule    Sig: Take 1 capsule (500 mg total) by mouth 3 (three) times daily.    Dispense:  21 capsule    Refill:  0   Return precautions advised.  Garret Reddish, MD

## 2016-09-26 NOTE — Progress Notes (Signed)
Pre visit review using our clinic review tool, if applicable. No additional management support is needed unless otherwise documented below in the visit note. 

## 2016-09-26 NOTE — Patient Instructions (Signed)
Appear to have infection of the skin with point of infection likely being the blister- will  treate with antibiotic keflex If not improving by Monday or symptoms worsen (redness goes up the foot more or fever) would advise repeat evaluation with a physician. Suspect blister may take longer to heal potentially weeks. Advised trying to leave open to air if able or wear loose fitting shooes.

## 2016-10-07 ENCOUNTER — Encounter: Payer: Self-pay | Admitting: Family Medicine

## 2016-10-07 ENCOUNTER — Ambulatory Visit (INDEPENDENT_AMBULATORY_CARE_PROVIDER_SITE_OTHER): Payer: Medicare HMO | Admitting: Family Medicine

## 2016-10-07 VITALS — BP 102/62 | HR 61

## 2016-10-07 DIAGNOSIS — Z23 Encounter for immunization: Secondary | ICD-10-CM | POA: Diagnosis not present

## 2016-10-07 DIAGNOSIS — S01511A Laceration without foreign body of lip, initial encounter: Secondary | ICD-10-CM

## 2016-10-07 NOTE — Patient Instructions (Signed)
Lip is going to have to heal by secondary intention at this point. Depending on long term cosmetic result- likely 2-3 months before we know- would consider plastic surgery referral. Could also ask oral surgeon his opinion.

## 2016-10-07 NOTE — Progress Notes (Signed)
Pre visit review using our clinic review tool, if applicable. No additional management support is needed unless otherwise documented below in the visit note. 

## 2016-10-07 NOTE — Progress Notes (Signed)
Subjective:  Charles Villa is a 71 y.o. year old very pleasant male patient who presents for/with See problem oriented charting ROS- no fever, chills, nausea, vomiting. No expanding redness. Only mild pain.    Past Medical History-  Patient Active Problem List   Diagnosis Date Noted  . OCD (obsessive compulsive disorder) 01/23/2016    Priority: Medium  . Essential hypertension, benign 03/11/2014    Priority: Medium  . Hyperlipidemia 02/10/2007    Priority: Medium  . PSORIASIS 02/10/2007    Priority: Medium  . Personal history of colonic polyps 03/14/2014    Priority: Low  . Anal fissure 08/17/2012    Priority: Low  . SQUAMOUS CELL CARCINOMA OF SKIN SITE UNSPECIFIED 08/24/2010    Priority: Low  . GERD 02/23/2007    Priority: Low  . BACK PAIN, CHRONIC 02/10/2007    Priority: Low  . Tremor 05/06/2016    Medications- reviewed and updated Current Outpatient Prescriptions  Medication Sig Dispense Refill  . aspirin 325 MG EC tablet Take 160 mg by mouth daily.     . bisoprolol-hydrochlorothiazide (ZIAC) 2.5-6.25 MG tablet Take 1 tablet by mouth daily. 90 tablet 3  . clotrimazole-betamethasone (LOTRISONE) lotion Apply 1 application topically 2 (two) times daily. (Patient taking differently: Apply 1 application topically as needed. ) 30 mL 1  . desonide (DESONATE) 0.05 % gel Apply topically 2 (two) times daily. (Patient taking differently: Apply topically as needed. ) 60 g 11  . famotidine (PEPCID) 40 MG tablet Take 1 tablet (40 mg total) by mouth at bedtime. (Patient taking differently: Take 40 mg by mouth as needed. ) 90 tablet 3  . fluocinolone (VANOS) 0.01 % cream Apply topically as needed.     . hydrocortisone (ANUSOL-HC) 2.5 % rectal cream Place 1 application rectally 2 (two) times daily. (Patient taking differently: Place 1 application rectally as needed. ) 30 g 1  . Krill Oil Omega-3 300 MG CAPS Take 1 capsule by mouth daily. (Patient taking differently: Take 1 capsule by mouth  every other day. )    . PARoxetine (PAXIL) 20 MG tablet Take 1 tablet by mouth at bedtime.  1  . Probiotic Product (PROBIOTIC DAILY PO) Take by mouth every other day.     . simvastatin (ZOCOR) 20 MG tablet Take 1 tablet (20 mg total) by mouth daily. 90 tablet 3   No current facility-administered medications for this visit.     Objective: BP 102/62 (BP Location: Left Arm, Patient Position: Sitting, Cuff Size: Normal)   Pulse 61   SpO2 96%  Gen: NAD, resting comfortably Chipped tooth x2 in front of mouth. Left upper lip with a split noted- v shaped perhaps a centimeter long, some dried blood in his mustache above this CV: RRR no murmurs rubs or gallops Lungs: CTAB no crackles, wheeze, rhonchi Ext: no edema Skin: warm, dry Neuro: grossly normal, moves all extremities, intact sensation t o lip  Assessment/Plan:  Lip laceration, initial encounter - Plan: Tdap vaccine greater than or equal to 7yo IM Need for prophylactic vaccination with combined diphtheria-tetanus-pertussis (DTP) vaccine - Plan: Tdap vaccine greater than or equal to 7yo IM S: Patient fell and busted his lip and had laceration last Thursday. Went to dentist today who told him to come to PCP. Fractured tooth and has appointment with oral surgeon and was told likely will have at least one tooth extracted. Was told to come to PCP and that dentist would not suture it. Only mild pain in lip thankfully.  Day 5 since injury- appears to be slowly healing. Patient states substantially less swollen  A/P: Lip laceration without signs of infection. Day 5 and closing this with sutures would promote infection at this point. May have poor cosmetic result- offered plastic surgery consult now but discussed unlikely anything would be done at present. Also offered plastic surgery consult later if he is not pleased with result and he agrees to this. Given last Tetanus shot was given in 2010- we opted to update as 8 years out.    Orders Placed This  Encounter  Procedures  . Tdap vaccine greater than or equal to 7yo IM   Return precautions advised.  Garret Reddish, MD

## 2016-10-08 DIAGNOSIS — W0110XA Fall on same level from slipping, tripping and stumbling with subsequent striking against unspecified object, initial encounter: Secondary | ICD-10-CM | POA: Diagnosis not present

## 2016-10-08 DIAGNOSIS — S025XXA Fracture of tooth (traumatic), initial encounter for closed fracture: Secondary | ICD-10-CM | POA: Diagnosis not present

## 2016-10-10 ENCOUNTER — Ambulatory Visit: Payer: Medicare HMO | Admitting: Family Medicine

## 2016-10-15 DIAGNOSIS — R69 Illness, unspecified: Secondary | ICD-10-CM | POA: Diagnosis not present

## 2016-10-15 DIAGNOSIS — W0110XA Fall on same level from slipping, tripping and stumbling with subsequent striking against unspecified object, initial encounter: Secondary | ICD-10-CM | POA: Diagnosis not present

## 2016-10-15 DIAGNOSIS — S025XXA Fracture of tooth (traumatic), initial encounter for closed fracture: Secondary | ICD-10-CM | POA: Diagnosis not present

## 2016-10-16 NOTE — Addendum Note (Signed)
Addended by: Mariam Dollar, Roselyn Reef M on: 10/16/2016 12:07 PM   Modules accepted: Orders

## 2016-11-13 DIAGNOSIS — R69 Illness, unspecified: Secondary | ICD-10-CM | POA: Diagnosis not present

## 2016-11-21 DIAGNOSIS — L03116 Cellulitis of left lower limb: Secondary | ICD-10-CM | POA: Diagnosis not present

## 2016-12-12 NOTE — Progress Notes (Signed)
Charles Villa was seen today in the movement disorders clinic for neurologic consultation at the request of Marin Olp, MD.  The consultation is for the evaluation of tremor.  The records that were made available to me were reviewed.  This patient is accompanied in the office by his spouse who supplements the history.    Tremor: Yes.   , bilateral but is R hand down  How long has it been going on? 4-6 months per notes/pt and several years per wife  At rest or with activation?  activation  When is it noted the most?  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  Yes.  , perhaps his father  Affected by caffeine:  No. (1 cup coffee per day)  Affected by alcohol:  Yes.   (3-4 drinks per day - when he cut back on gin tremor was better)  Affected by stress:  May or may not  Affected by fatigue: no, tremor is worse in the AM and subsides as day goes on  Spills soup if on spoon:  May or may not but usually able to keep soup on spoon  12/16/16 update:  Patient seen today in follow-up. He is accompanied by his wife who supplements the history.   I have not seen him in about 6 months.  I have reviewed records since our last visit.  We checked labs during our last visit and his thiamine was low (likely due to alcohol) and a thiamine supplement was recommended. He is now on this faithfully.   We discussed weaning of alcohol at our last visit.  He states that he has cut back and drinking 1.5-2 drinks of gin per day.  Tremor has improved but is different on different days.    He had an MRI of the brain and last visit.  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  There was very mild small vessel disease.  He is already on aspirin.  He did fall in March and lacerated his lip and broke a tooth.  States that he was trying to keep up with the couple he was walking with in the park and now has a temporary bridge.    Neuroimaging has not previously been performed.   PREVIOUS  MEDICATIONS: none to date  ALLERGIES:  No Known Allergies  CURRENT MEDICATIONS:  Outpatient Encounter Prescriptions as of 12/16/2016  Medication Sig  . aspirin 325 MG EC tablet Take 160 mg by mouth daily.   . bisoprolol-hydrochlorothiazide (ZIAC) 2.5-6.25 MG tablet Take 1 tablet by mouth daily.  . clotrimazole-betamethasone (LOTRISONE) lotion Apply 1 application topically 2 (two) times daily. (Patient taking differently: Apply 1 application topically as needed. )  . desonide (DESONATE) 0.05 % gel Apply topically 2 (two) times daily. (Patient taking differently: Apply topically as needed. )  . famotidine (PEPCID) 40 MG tablet Take 1 tablet (40 mg total) by mouth at bedtime. (Patient taking differently: Take 40 mg by mouth as needed. )  . fluocinolone (VANOS) 0.01 % cream Apply topically as needed.   . hydrocortisone (ANUSOL-HC) 2.5 % rectal cream Place 1 application rectally 2 (two) times daily. (Patient taking differently: Place 1 application rectally as needed. )  . Krill Oil Omega-3 300 MG CAPS Take 1 capsule by mouth daily. (Patient taking differently: Take 1 capsule by mouth every other day. )  . PARoxetine (PAXIL) 20 MG tablet Take 1 tablet by mouth 3 (three) times daily.   [name]  Probiotic Product (PROBIOTIC DAILY PO) Take by mouth every other day.   . simvastatin (ZOCOR) 20 MG tablet Take 1 tablet (20 mg total) by mouth daily.   No facility-administered encounter medications on file as of 12/16/2016.     PAST MEDICAL HISTORY:   Past Medical History:  Diagnosis Date  . Diverticulosis of colon    2007  . GERD (gastroesophageal reflux disease)   . Hyperlipidemia   . Hypertension   . Scalp psoriasis     PAST SURGICAL HISTORY:   Past Surgical History:  Procedure Laterality Date  . COLONOSCOPY W/ POLYPECTOMY     2007 [name]  . SKIN CANCER EXCISION     squamous cell CA - nose  . TONSILLECTOMY      SOCIAL HISTORY:   Social History   Social History  . Marital status: Married      Spouse name: N/A  . Number of children: 2  . Years of education: N/A   Occupational History  . customer Geneticist, molecular Unemployed   Social History Main Topics  . Smoking status: Never Smoker  . Smokeless tobacco: Never Used  . Alcohol use 12.6 oz/week    21 Standard drinks or equivalent per week     Comment: 3-4 per day (gin)  . Drug use: No  . Sexual activity: Not on file   Other Topics Concern  . Not on file   Social History Narrative   Married 42 years in 2015. 2 kids, 1 grandchild that is 56.7 years old.       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       Hobbies: ride bike, ski in winter     FAMILY HISTORY:   Family Status  Relation Status  . Father Deceased  . Mother Deceased  . Son Alive  . Son Alive    ROS:  Some DOE.    A complete 10 system review of systems was obtained and was unremarkable apart from what is mentioned above.  PHYSICAL EXAMINATION:    VITALS:   Vitals:   12/16/16 0846  BP: 140/90  Pulse: 65  SpO2: 91%  Weight: 231 lb (104.8 kg)  Height: 6' (1.829 m)    GEN:  The patient appears stated age and is in NAD. HEENT:  Normocephalic, atraumatic.  The mucous membranes are moist. The superficial temporal arteries are without ropiness or tenderness. CV:  RRR Lungs:  CTAB Neck/HEME:  There are no carotid bruits bilaterally.  Neurological examination:  Orientation: The patient is alert and oriented x3.  Cranial nerves: There is good facial symmetry. The visual fields are full to confrontational testing. The speech is fluent and clear. Soft palate rises symmetrically and there is no tongue deviation. Hearing is intact to conversational tone. Sensation: Sensation is intact to light and pinprick throughout (facial, trunk, extremities). Vibration is decreased at the bilateral big toe and ankle.  Pinprick is not decreased in a stocking distribution. There is no extinction with double simultaneous stimulation.  There is no sensory dermatomal level identified. Motor: Strength is 5/5 in the bilateral upper and lower extremities.   Shoulder shrug is equal and symmetric.  There is no pronator drift.  Movement examination: Tone: The patient had trouble relaxing (gegenhalten) Abnormal movements: There is a postural tremor that increases with intention bilaterally, L more than right. It doesn't get worse with a weight.  No rest tremor noted Coordination:  There is no decremation noted. Gait and Station:  The patient has no difficulty arising out of a deep-seated chair without the use of the hands. The patient's stride length is normal with no change in arm swing today.  He is a bit slow and purposeful.   [name]  LABS    Chemistry      Component Value Date/Time   NA 137 06/17/2016 1011   K 4.6 06/17/2016 1011   CL 103 06/17/2016 1011   CO2 26 06/17/2016 1011   BUN 13 06/17/2016 1011   CREATININE 1.03 06/17/2016 1011      Component Value Date/Time   CALCIUM 9.1 06/17/2016 1011   ALKPHOS 43 06/17/2016 1011   AST 42 (H) 06/17/2016 1011   ALT 33 06/17/2016 1011   BILITOT 0.6 06/17/2016 1011     Lab Results  Component Value Date   TSH 1.39 06/17/2016   Lab Results  Component Value Date   VITAMINB12 406 06/17/2016     ASSESSMENT/PLAN:  1.  Tremor  -While the patient most certainly does have tremor, it is a confusing picture, primarily because of the amount of alcohol he drinks.  He reports that he wakes up with tremor, and then it is gone much of the day (presumably because of alcohol).  I have now seen him 6 months apart and he doesn't look significantly worse and I am doubtful of PD.    2.  Peripheral neuropathy  -The patient has clinical examination evidence of a diffuse peripheral neuropathy, which certainly can affect gait and balance.  We discussed safety associated with peripheral neuropathy.  We discussed balance therapy and the importance of ambulatory assistive device for balance  assistance.  He decided to hold on that   - Talked to him about d/c of EtOH.  His wife is frustrated but honest and he does not wish to quit.    3.  Abnormal brain scan  -brain scan actually looked really good.   Showed films to pt/wife.   Discussed with pt/wife that remote old small R frontal infarct is incidental.  Wife thought that it could contribute to personality change but I don't.  I wonder if that also is EtOH affet and if they would like to explore that in the future, neuropsych testing is available.  Pt is actively seeing psych as well.  4.  F/u prn.  Much greater than 50% of this visit was spent in counseling and coordinating care.  Total face to face time:  25 min    Cc:  Marin Olp, MD

## 2016-12-16 ENCOUNTER — Ambulatory Visit (INDEPENDENT_AMBULATORY_CARE_PROVIDER_SITE_OTHER): Payer: Medicare HMO | Admitting: Neurology

## 2016-12-16 ENCOUNTER — Encounter: Payer: Self-pay | Admitting: Neurology

## 2016-12-16 VITALS — BP 140/90 | HR 65 | Ht 72.0 in | Wt 231.0 lb

## 2016-12-16 DIAGNOSIS — R9089 Other abnormal findings on diagnostic imaging of central nervous system: Secondary | ICD-10-CM | POA: Diagnosis not present

## 2016-12-16 DIAGNOSIS — R69 Illness, unspecified: Secondary | ICD-10-CM | POA: Diagnosis not present

## 2016-12-16 DIAGNOSIS — F1099 Alcohol use, unspecified with unspecified alcohol-induced disorder: Secondary | ICD-10-CM

## 2016-12-16 DIAGNOSIS — G621 Alcoholic polyneuropathy: Secondary | ICD-10-CM

## 2016-12-16 DIAGNOSIS — R251 Tremor, unspecified: Secondary | ICD-10-CM | POA: Diagnosis not present

## 2016-12-16 DIAGNOSIS — IMO0002 Reserved for concepts with insufficient information to code with codable children: Secondary | ICD-10-CM

## 2016-12-30 ENCOUNTER — Encounter: Payer: Self-pay | Admitting: Family Medicine

## 2016-12-30 ENCOUNTER — Ambulatory Visit (INDEPENDENT_AMBULATORY_CARE_PROVIDER_SITE_OTHER): Payer: Medicare HMO | Admitting: Family Medicine

## 2016-12-30 VITALS — BP 110/76 | HR 71 | Temp 97.5°F | Ht 72.0 in | Wt 232.0 lb

## 2016-12-30 DIAGNOSIS — R0602 Shortness of breath: Secondary | ICD-10-CM | POA: Diagnosis not present

## 2016-12-30 LAB — BASIC METABOLIC PANEL
BUN: 14 mg/dL (ref 6–23)
CHLORIDE: 103 meq/L (ref 96–112)
CO2: 24 mEq/L (ref 19–32)
Calcium: 9.2 mg/dL (ref 8.4–10.5)
Creatinine, Ser: 0.98 mg/dL (ref 0.40–1.50)
GFR: 80.57 mL/min (ref >60.00)
Glucose, Bld: 117 mg/dL — ABNORMAL HIGH (ref 70–99)
POTASSIUM: 4.1 meq/L (ref 3.5–5.1)
SODIUM: 137 meq/L (ref 135–145)

## 2016-12-30 LAB — CBC
HCT: 42.2 % (ref 39.0–52.0)
Hemoglobin: 14.4 g/dL (ref 13.0–17.0)
MCHC: 34.3 g/dL (ref 30.0–36.0)
MCV: 103.1 fl — ABNORMAL HIGH (ref 78.0–100.0)
Platelets: 191 10*3/uL (ref 150.0–400.0)
RBC: 4.09 Mil/uL — ABNORMAL LOW (ref 4.22–5.81)
RDW: 13 % (ref 11.5–15.5)
WBC: 6.4 10*3/uL (ref 4.0–10.5)

## 2016-12-30 LAB — BRAIN NATRIURETIC PEPTIDE: PRO B NATRI PEPTIDE: 96 pg/mL (ref 0.0–100.0)

## 2016-12-30 NOTE — Progress Notes (Signed)
Subjective:  Charles Villa is a 71 y.o. year old very pleasant male patient who presents for/with See problem oriented charting ROS- No chest pain. No headache or blurry vision. No dizziness or palpitations.    Past Medical History-  Patient Active Problem List   Diagnosis Date Noted  . OCD (obsessive compulsive disorder) 01/23/2016    Priority: Medium  . Essential hypertension, benign 03/11/2014    Priority: Medium  . Hyperlipidemia 02/10/2007    Priority: Medium  . PSORIASIS 02/10/2007    Priority: Medium  . Personal history of colonic polyps 03/14/2014    Priority: Low  . Anal fissure 08/17/2012    Priority: Low  . SQUAMOUS CELL CARCINOMA OF SKIN SITE UNSPECIFIED 08/24/2010    Priority: Low  . GERD 02/23/2007    Priority: Low  . BACK PAIN, CHRONIC 02/10/2007    Priority: Low  . Tremor 05/06/2016    Medications- reviewed and updated Current Outpatient Prescriptions  Medication Sig Dispense Refill  . aspirin 325 MG EC tablet Take 160 mg by mouth daily.     . bisoprolol-hydrochlorothiazide (ZIAC) 2.5-6.25 MG tablet Take 1 tablet by mouth daily. 90 tablet 3  . clotrimazole-betamethasone (LOTRISONE) lotion Apply 1 application topically 2 (two) times daily. (Patient taking differently: Apply 1 application topically as needed. ) 30 mL 1  . desonide (DESONATE) 0.05 % gel Apply topically 2 (two) times daily. (Patient taking differently: Apply topically as needed. ) 60 g 11  . famotidine (PEPCID) 40 MG tablet Take 1 tablet (40 mg total) by mouth at bedtime. (Patient taking differently: Take 40 mg by mouth as needed. ) 90 tablet 3  . fluocinolone (VANOS) 0.01 % cream Apply topically as needed.     . hydrocortisone (ANUSOL-HC) 2.5 % rectal cream Place 1 application rectally 2 (two) times daily. (Patient taking differently: Place 1 application rectally as needed. ) 30 g 1  . Krill Oil Omega-3 300 MG CAPS Take 1 capsule by mouth daily. (Patient taking differently: Take 1 capsule by  mouth every other day. )    . PARoxetine (PAXIL) 30 MG tablet Take 60 mg by mouth daily.   1  . Probiotic Product (PROBIOTIC DAILY PO) Take by mouth every other day.     . simvastatin (ZOCOR) 20 MG tablet Take 1 tablet (20 mg total) by mouth daily. 90 tablet 3   No current facility-administered medications for this visit.     Objective: BP 110/76 (BP Location: Left Arm, Patient Position: Sitting, Cuff Size: Large)   Pulse 71   Temp 97.5 F (36.4 C) (Oral)   Ht 6' (1.829 m)   Wt 232 lb (105.2 kg)   SpO2 95%   BMI 31.46 kg/m  Gen: NAD, resting comfortably CV: RRR no murmurs rubs or gallops Lungs: CTAB no crackles, wheeze, rhonchi Abdomen: obese Ext: no edema Skin: warm, dry  Assessment/Plan:  Shortness of breath - Plan: Brain Natriuretic Peptide, CBC, Basic metabolic panel, Ambulatory referral to Cardiology S: dyspnea on exertion- very slightly. Has 62 steps between house and water level and its always been a challenge- feeling it more this year. No chest pain with this. Better with rest. No left arm or neck pain. Normal day to day activity and normal flight of stairs not bothering him.   He wonders if this is deconditioning related. Weight from last June up 6 lbs. Not doing dedicated exercising.  A/P: I offered patient labs as well as EKG. Lung exam reassuring so no x-ray planned. If  reassuring- proceed with graduated exercise program. He tells me his wife would strongly prefer for him to see cardiology- declines EKG and workup here- we referred to cardiology at his request. Does have risk factors for CAD including age, hypertension, hyperlipidemia. Strict return precautions discussed.   Has some redness on right lower leg around site of injury. He states not worsening. Is slightly warm- advised him to follow up immediately if worsening symptoms.   Orders Placed This Encounter  Procedures  . Brain Natriuretic Peptide  . CBC    [place]  . Basic metabolic panel      .  Ambulatory referral to Cardiology    Referral Priority:   Routine    Referral Type:   Consultation    Referral Reason:   Specialty Services Required    Requested Specialty:   Cardiology    Number of Visits Requested:   1   Meds ordered this encounter  Medications  . PARoxetine (PAXIL) 30 MG tablet    Sig: Take 60 mg by mouth daily.     Refill:  1   Garret Reddish, MD

## 2016-12-30 NOTE — Patient Instructions (Signed)
Please stop by lab before you go  We will call you within a week or two about your referral to cardiology. If you do not hear within 3 weeks, give [place] a call. If you have new or worsening symptoms please seek care immediately

## 2017-01-08 DIAGNOSIS — R69 Illness, unspecified: Secondary | ICD-10-CM | POA: Diagnosis not present

## 2017-01-17 ENCOUNTER — Ambulatory Visit: Payer: Medicare HMO | Admitting: Cardiovascular Disease

## 2017-01-20 NOTE — Progress Notes (Deleted)
Pre visit review using our clinic review tool, if applicable. No additional management support is needed unless otherwise documented below in the visit note. 

## 2017-01-20 NOTE — Progress Notes (Deleted)
Subjective:   Charles Villa is a 71 y.o. male who presents for an Initial Medicare Annual Wellness Visit.  Review of Systems    No ROS.  Medicare Wellness Visit. Additional risk factors are reflected in the social history.     Sleep patterns:    Home Safety/Smoke Alarms: Feels safe in home. Smoke alarms in place.  Living environment; residence and Firearm Safety: . [place] Safety/Bike Helmet: Wears seat belt.   Counseling:   Eye Exam-  Dental-  Male:   CCS-   10/23/2015  Precancerous Polyps removed. 3 year recall. PSA-  Lab Results  Component Value Date   PSA 0.40 08/04/2015   PSA 0.44 03/07/2014   PSA 0.46 01/04/2013       Objective:    There were no vitals filed for this visit. There is no height or weight on file to calculate BMI.   Current Medications (verified) Outpatient Encounter Prescriptions as of 01/21/2017  Medication Sig  . aspirin 325 MG EC tablet Take 160 mg by mouth daily.   . bisoprolol-hydrochlorothiazide (ZIAC) 2.5-6.25 MG tablet Take 1 tablet by mouth daily.  . clotrimazole-betamethasone (LOTRISONE) lotion Apply 1 application topically 2 (two) times daily. (Patient taking differently: Apply 1 application topically as needed. )  . desonide (DESONATE) 0.05 % gel Apply topically 2 (two) times daily. (Patient taking differently: Apply topically as needed. )  . famotidine (PEPCID) 40 MG tablet Take 1 tablet (40 mg total) by mouth at bedtime. (Patient taking differently: Take 40 mg by mouth as needed. )  . fluocinolone (VANOS) 0.01 % cream Apply topically as needed.   . hydrocortisone (ANUSOL-HC) 2.5 % rectal cream Place 1 application rectally 2 (two) times daily. (Patient taking differently: Place 1 application rectally as needed. )  . Krill Oil Omega-3 300 MG CAPS Take 1 capsule by mouth daily. (Patient taking differently: Take 1 capsule by mouth every other day. )  . PARoxetine (PAXIL) 30 MG tablet Take 60 mg by mouth daily.   . Probiotic  Product (PROBIOTIC DAILY PO) Take by mouth every other day.   . simvastatin (ZOCOR) 20 MG tablet Take 1 tablet (20 mg total) by mouth daily.   No facility-administered encounter medications on file as of 01/21/2017.     Allergies (verified) Patient has no known allergies.   History: Past Medical History:  Diagnosis Date  . Anal fissure 08/17/2012   Really more gluteal crease irritation- protosol hc 2.5% prn   . BACK PAIN, CHRONIC 02/10/2007  . Diverticulosis of colon    2007  . GERD 02/23/2007  . GERD (gastroesophageal reflux disease)   . Hyperlipidemia   . Hypertension   . OCD (obsessive compulsive disorder) 01/23/2016   Ocd/hoarding issues- psychiatristis- Cottle started on zoloft 100mg --> paxil  . Personal history of colonic polyps 03/14/2014   08/2005. Benign. 10 year repeat. 2010 colonoscopy in record was entered erroneously.    [name] PSORIASIS 02/10/2007   Topical therapy.     . Scalp psoriasis   . SQUAMOUS CELL CARCINOMA OF SKIN SITE UNSPECIFIED 08/24/2010   Annotation: face    Past Surgical History:  Procedure Laterality Date  . COLONOSCOPY W/ POLYPECTOMY     2007 [name]  . SKIN CANCER EXCISION     squamous cell CA - nose  . TONSILLECTOMY     Family History  Problem Relation Age of Onset  . Lung cancer Father   . Stroke Mother   . Hypertension Mother    Social  History   Occupational History  . customer Geneticist, molecular Unemployed   Social History Main Topics  . Smoking status: Never Smoker  . Smokeless tobacco: Never Used  . Alcohol use 12.6 oz/week    21 Standard drinks or equivalent per week     Comment: 3-4 per day (gin)  . Drug use: No  . Sexual activity: Not on file    Tobacco Counseling Counseling given: Not Answered   Activities of Daily Living No flowsheet data found.  Immunizations and Health Maintenance Immunization History  Administered Date(s) Administered  . DTP 08/10/2007  . Influenza Split 06/08/2012  . Influenza Whole 08/21/2009    . Influenza, High Dose Seasonal PF 05/12/2013, 05/06/2016  . Influenza, Seasonal, Injecte, Preservative Fre 05/23/2013  . Influenza-Unspecified 05/01/2015  . Pneumococcal Conjugate-13 08/30/2013  . Pneumococcal Polysaccharide-23 03/11/2014  . Td 07/29/1998, 08/15/2008  . Tdap 10/07/2016   Health Maintenance Due  Topic Date Due  . Hepatitis C Screening  [birth year]    Patient Care Team: Marin Olp, MD as PCP - General (Family Medicine)  Indicate any recent Medical Services you may have received from other than [facility] providers in the past year (date may be approximate).     Assessment:   This is a routine wellness examination for Charles Villa. Physical assessment deferred to PCP.   Hearing/Vision screen No exam data present  Dietary issues and exercise activities discussed:   Diet (meal preparation, eat out, water intake, caffeinated beverages, dairy products, fruits and vegetables): Breakfast: Lunch:  Dinner:      Goals    None     Depression Screen PHQ 2/9 Scores 10/07/2016 08/07/2015  PHQ - 2 Score 0 0    Fall Risk Fall Risk  12/16/2016 10/07/2016 06/17/2016 08/07/2015  Falls in the past year? Yes Yes No No  Number falls in past yr: 1 - - -  Injury with Fall? No - - -  Follow up Falls evaluation completed - - -    Cognitive Function:        Screening Tests Health Maintenance  Topic Date Due  . Hepatitis C Screening  Apr 13, 1946  . INFLUENZA VACCINE  02/26/2017  . COLONOSCOPY  10/23/2018  . TETANUS/TDAP  10/08/2026  . PNA vac Low Risk Adult  Completed      Plan:   Follow up with PCP as directed.  I have personally reviewed and noted the following in the patient's chart:   . Medical and social history . Use of alcohol, tobacco or illicit drugs  . Current medications and supplements . Functional ability and status . Nutritional status . Physical activity . Advanced directives . List of other physicians . Vitals . Screenings to include cognitive,  depression, and falls . Referrals and appointments  In addition, I have reviewed and discussed with patient certain preventive protocols, quality metrics, and best practice recommendations. A written personalized care plan for preventive services as well as general preventive health recommendations were provided to patient.     Ree Edman, RN   01/20/2017

## 2017-01-21 ENCOUNTER — Ambulatory Visit: Payer: Medicare HMO

## 2017-01-30 NOTE — Progress Notes (Signed)
Subjective:   Charles Villa is a 71 y.o. male who presents for an Initial Medicare Annual Wellness Visit.  Review of Systems  No ROS.  Medicare Wellness Visit. Additional risk factors are reflected in the social history.    Sleep patterns:  4-6 hrs/night. Wakes up feeling rested.  Home Safety/Smoke Alarms: Feels safe in home. Smoke alarms in place.  Living environment; residence and Firearm Safety: two story home, lives with wife.  Seat Belt Safety/Bike Helmet: Wears seat belt.   Counseling:   Dental-  Yearly.  Male:   CCS-  10/23/2015  3 year recall. PSA-  Lab Results  Component Value Date   PSA 0.40 08/04/2015   PSA 0.44 03/07/2014   PSA 0.46 01/04/2013      Objective:    Today's Vitals   02/03/17 1323  BP: (!) 110/58  Pulse: 72  Resp: 18  SpO2: 97%  Weight: 228 lb 14.4 oz (103.8 kg)   Body mass index is 31.04 kg/m.  Current Medications (verified) Outpatient Encounter Prescriptions as of 02/03/2017  Medication Sig  . aspirin 325 MG EC tablet Take 160 mg by mouth daily.   . bisoprolol-hydrochlorothiazide (ZIAC) 2.5-6.25 MG tablet Take 1 tablet by mouth daily.  . clotrimazole-betamethasone (LOTRISONE) lotion Apply 1 application topically 2 (two) times daily. (Patient taking differently: Apply 1 application topically as needed. )  . desonide (DESONATE) 0.05 % gel Apply topically 2 (two) times daily. (Patient taking differently: Apply topically as needed. )  . famotidine (PEPCID) 40 MG tablet Take 1 tablet (40 mg total) by mouth at bedtime. (Patient taking differently: Take 40 mg by mouth as needed. )  . fluocinolone (VANOS) 0.01 % cream Apply topically as needed.   . hydrocortisone (ANUSOL-HC) 2.5 % rectal cream Place 1 application rectally 2 (two) times daily. (Patient taking differently: Place 1 application rectally as needed. )  . Krill Oil Omega-3 300 MG CAPS Take 1 capsule by mouth daily. (Patient taking differently: Take 1 capsule by mouth every other day. )   . PARoxetine (PAXIL) 30 MG tablet Take 60 mg by mouth daily.   . Probiotic Product (PROBIOTIC DAILY PO) Take by mouth every other day.   . simvastatin (ZOCOR) 20 MG tablet Take 1 tablet (20 mg total) by mouth daily.   No facility-administered encounter medications on file as of 02/03/2017.     Allergies (verified) Patient has no known allergies.   History: Past Medical History:  Diagnosis Date  . Anal fissure 08/17/2012   Really more gluteal crease irritation- protosol hc 2.5% prn   . BACK PAIN, CHRONIC 02/10/2007  . Diverticulosis of colon    2007  . GERD 02/23/2007  . GERD (gastroesophageal reflux disease)   . Hyperlipidemia   . Hypertension   . OCD (obsessive compulsive disorder) 01/23/2016   Ocd/hoarding issues- psychiatristis- Cottle started on zoloft 100mg --> paxil  . Personal history of colonic polyps 03/14/2014   08/2005. Benign. 10 year repeat. 2010 colonoscopy in record was entered erroneously.    [name] PSORIASIS 02/10/2007   Topical therapy.     . Scalp psoriasis   . SQUAMOUS CELL CARCINOMA OF SKIN SITE UNSPECIFIED 08/24/2010   Annotation: face    Past Surgical History:  Procedure Laterality Date  . COLONOSCOPY W/ POLYPECTOMY     2007 [name]  . SKIN CANCER EXCISION     squamous cell CA - nose  . TONSILLECTOMY     Family History  Problem Relation Age of Onset  .  Lung cancer Father   . Stroke Mother   . Hypertension Mother    Social History   Occupational History  . customer Geneticist, molecular Unemployed   Social History Main Topics  . Smoking status: Never Smoker  . Smokeless tobacco: Never Used  . Alcohol use 12.6 oz/week    21 Standard drinks or equivalent per week     Comment: 2-3 per day (gin)  . Drug use: No  . Sexual activity: Not on file   Tobacco Counseling Counseling given: Not Answered   Activities of Daily Living No flowsheet data found.  Immunizations and Health Maintenance Immunization History  Administered Date(s) Administered  . DTP  08/10/2007  . Influenza Split 06/08/2012  . Influenza Whole 08/21/2009  . Influenza, High Dose Seasonal PF 05/12/2013, 05/06/2016  . Influenza, Seasonal, Injecte, Preservative Fre 05/23/2013  . Influenza-Unspecified 05/01/2015  . Pneumococcal Conjugate-13 08/30/2013  . Pneumococcal Polysaccharide-23 03/11/2014  . Td 07/29/1998, 08/15/2008  . Tdap 10/07/2016   Health Maintenance Due  Topic Date Due  . Hepatitis C Screening  23-Jan-1946    Patient Care Team: Marin Olp, MD as PCP - General (Family Medicine)  Indicate any recent Medical Services you may have received from other than [facility] providers in the past year (date may be approximate).    Assessment:   This is a routine wellness examination for Charles Villa. Physical assessment deferred to PCP.   Hearing/Vision screen Hearing Screening Comments: Able to hear conversational tones w/o difficulty. No issues reported.   Vision Screening Comments: Wears progressive lenses. States he is due for a vision exam.   Dietary issues and exercise activities discussed:   Diet (meal preparation, eat out, water intake, caffeinated beverages, dairy products, fruits and vegetables): 3 meals/day. Feels like he eats a healthy diet. 0-3 glasses of water/day. Soda just when on the road.  Breakfast: Cereal, OJ sometimes coffee or toast and butter and jelly.  Lunch: 1/2 sandwich or fast food meal if on the road.  Dinner:  Meat, vegetables and potato.   Mostly cooked at home unless on the road.    Goals    None     Depression Screen PHQ 2/9 Scores 10/07/2016 08/07/2015  PHQ - 2 Score 0 0    Fall Risk Fall Risk  12/16/2016 10/07/2016 06/17/2016 08/07/2015  Falls in the past year? Yes Yes No No  Number falls in past yr: 1 - - -  Injury with Fall? No - - -  Follow up Falls evaluation completed - - -    Cognitive Function:   Ad8 score reviewed for issues:  Issues making decisions:no  Less interest in hobbies / activities:no  Repeats  questions, stories (family complaining):no  Trouble using ordinary gadgets (microwave, computer, phone):no  Forgets the month or year: no  Mismanaging finances: no  Remembering appts:no  Daily problems with thinking and/or memory:no Ad8 score is=0       Screening Tests Health Maintenance  Topic Date Due  . Hepatitis C Screening  27-Jun-1946  . INFLUENZA VACCINE  02/26/2017  . COLONOSCOPY  10/23/2018  . TETANUS/TDAP  10/08/2026  . PNA vac Low Risk Adult  Completed        Plan:   Follow up with PCP as directed. Bring a copy of your advance directives to your next office visit.  I have personally reviewed and noted the following in the patient's chart:   . Medical and social history . Use of alcohol, tobacco or illicit drugs  . Current  medications and supplements . Functional ability and status . Nutritional status . Physical activity . Advanced directives . List of other physicians . Vitals . Screenings to include cognitive, depression, and falls . Referrals and appointments  In addition, I have reviewed and discussed with patient certain preventive protocols, quality metrics, and best practice recommendations. A written personalized care plan for preventive services as well as general preventive health recommendations were provided to patient.     Ree Edman, RN   02/03/2017

## 2017-01-30 NOTE — Progress Notes (Signed)
Pre visit review using our clinic review tool, if applicable. No additional management support is needed unless otherwise documented below in the visit note. 

## 2017-02-03 ENCOUNTER — Ambulatory Visit (INDEPENDENT_AMBULATORY_CARE_PROVIDER_SITE_OTHER): Payer: Medicare HMO

## 2017-02-03 ENCOUNTER — Ambulatory Visit (INDEPENDENT_AMBULATORY_CARE_PROVIDER_SITE_OTHER): Payer: Medicare HMO | Admitting: Internal Medicine

## 2017-02-03 ENCOUNTER — Encounter: Payer: Self-pay | Admitting: Internal Medicine

## 2017-02-03 VITALS — BP 110/58 | HR 72 | Resp 18 | Wt 228.9 lb

## 2017-02-03 VITALS — BP 110/60 | HR 69 | Ht 72.0 in | Wt 229.0 lb

## 2017-02-03 DIAGNOSIS — E785 Hyperlipidemia, unspecified: Secondary | ICD-10-CM

## 2017-02-03 DIAGNOSIS — R0602 Shortness of breath: Secondary | ICD-10-CM | POA: Insufficient documentation

## 2017-02-03 DIAGNOSIS — Z0000 Encounter for general adult medical examination without abnormal findings: Secondary | ICD-10-CM | POA: Diagnosis not present

## 2017-02-03 DIAGNOSIS — I10 Essential (primary) hypertension: Secondary | ICD-10-CM

## 2017-02-03 DIAGNOSIS — R69 Illness, unspecified: Secondary | ICD-10-CM | POA: Diagnosis not present

## 2017-02-03 DIAGNOSIS — F1021 Alcohol dependence, in remission: Secondary | ICD-10-CM | POA: Insufficient documentation

## 2017-02-03 DIAGNOSIS — F1010 Alcohol abuse, uncomplicated: Secondary | ICD-10-CM | POA: Diagnosis not present

## 2017-02-03 NOTE — Patient Instructions (Addendum)
Your physician has requested that you have an echocardiogram @ [address] - [address]rd [address]. Echocardiography is a painless test that uses sound waves to create images of your heart. It provides your doctor with information about the size and shape of your heart and how well your heart's chambers and valves are working. This procedure takes approximately one hour. There are no restrictions for this procedure.  Your physician has requested that you have an exercise tolerance test. For further information please visit [email or web address]. Please also follow instruction sheet, as given.  Your physician recommends that you schedule a follow-up appointment after your testing.

## 2017-02-03 NOTE — Progress Notes (Signed)
OFFICE CONSULT NOTE  Chief Complaint:  Shortness of breath  Primary Care Physician: Charles Olp, MD  HPI:  Charles Villa is a 71 y.o. male who is being seen today for the evaluation of shortness of breath at the request of Charles Olp, MD. Charles Villa has a history of hypertension, dyslipidemia, GERD, chronic back pain, OCD and long-standing alcohol use, according to his wife who did most the talking during the visit he drinks about 2-3 drinks per day however could be more. She says that's increased more recently. She notes that over the past 2 years she's had progressively worsening shortness of breath. 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 They have a vacation house at [facility] and he has approximate 65 steps to climb up. He says he gets short of breath walking up the stairs and has been more difficult to do that without stopping. He seems to minimize his symptoms. His wife is concerned about possible coronary artery disease as a cause of this. She used to work in cardiac rehabilitation, possibly is an Physiological scientist. There is a family history of hypertension and stroke in his mother and his father who was a smoker died of lung cancer. He denies any chest pain or pressure, palpitations, presyncope or syncopal symptoms. He also denies any fever, chills, cough or other infective symptoms.  PMHx:  Past Medical History:  Diagnosis Date  . Anal fissure 08/17/2012   Really more gluteal crease irritation- protosol hc 2.5% prn   . BACK PAIN, CHRONIC 02/10/2007  . Diverticulosis of colon    2007  . GERD 02/23/2007  . GERD (gastroesophageal reflux disease)   . Hyperlipidemia   . Hypertension   . OCD (obsessive compulsive disorder) 01/23/2016   Ocd/hoarding issues- psychiatristis- Cottle started on zoloft 100mg --> paxil  . Personal history of colonic polyps 03/14/2014   08/2005. Benign. 10 year repeat. 2010 colonoscopy in record was  entered erroneously.    [name] PSORIASIS 02/10/2007   Topical therapy.     . Scalp psoriasis   . SQUAMOUS CELL CARCINOMA OF SKIN SITE UNSPECIFIED 08/24/2010   Annotation: face     Past Surgical History:  Procedure Laterality Date  . COLONOSCOPY W/ POLYPECTOMY     2007 [name]  . SKIN CANCER EXCISION     squamous cell CA - nose  . TONSILLECTOMY      FAMHx:  Family History  Problem Relation Age of Onset  . Lung cancer Father   . Stroke Mother   . Hypertension Mother     SOCHx:   reports that he has never smoked. He has never used smokeless tobacco. He reports that he drinks about 12.6 oz of alcohol per week . He reports that he does not use drugs.  ALLERGIES:  No Known Allergies  ROS: Pertinent items noted in HPI and remainder of comprehensive ROS otherwise negative.  HOME MEDS: Current Outpatient Prescriptions on File Prior to Visit  Medication Sig Dispense Refill  . aspirin 325 MG EC tablet Take 160 mg by mouth daily.     . bisoprolol-hydrochlorothiazide (ZIAC) 2.5-6.25 MG tablet Take 1 tablet by mouth daily. 90 tablet 3  . clotrimazole-betamethasone (LOTRISONE) lotion Apply 1 application topically 2 (two) times daily. (Patient taking differently: Apply 1 application topically as needed. ) 30 mL 1  . desonide (DESONATE) 0.05 % gel Apply topically 2 (two) times daily. (Patient taking differently: Apply topically as needed. ) 60 g 11  .  famotidine (PEPCID) 40 MG tablet Take 1 tablet (40 mg total) by mouth at bedtime. (Patient taking differently: Take 40 mg by mouth as needed. ) 90 tablet 3  . fluocinolone (VANOS) 0.01 % cream Apply topically as needed.     . hydrocortisone (ANUSOL-HC) 2.5 % rectal cream Place 1 application rectally 2 (two) times daily. (Patient taking differently: Place 1 application rectally as needed. ) 30 g 1  . Krill Oil Omega-3 300 MG CAPS Take 1 capsule by mouth daily. (Patient taking differently: Take 1 capsule by mouth every other day. )    . PARoxetine  (PAXIL) 30 MG tablet Take 60 mg by mouth daily.   1  . Probiotic Product (PROBIOTIC DAILY PO) Take by mouth every other day.     . simvastatin (ZOCOR) 20 MG tablet Take 1 tablet (20 mg total) by mouth daily. 90 tablet 3   No current facility-administered medications on file prior to visit.     LABS/IMAGING: No results found for this or any previous visit (from the past 48 hour(s)). No results found.  LIPID PANEL:    Component Value Date/Time   CHOL 216 (H) 08/04/2015 0903   TRIG 136.0 08/04/2015 0903   TRIG 89 08/11/2006 0818   HDL 57.70 08/04/2015 0903   CHOLHDL 4 08/04/2015 0903   VLDL 27.2 08/04/2015 0903   LDLCALC 131 (H) 08/04/2015 0903   LDLDIRECT 145.3 07/12/2013 1046    WEIGHTS: Wt Readings from Last 3 Encounters:  02/03/17 229 lb (103.9 kg)  02/03/17 228 lb 14.4 oz (103.8 kg)  12/30/16 232 lb (105.2 kg)    VITALS: BP 110/60   Pulse 69   Ht 6' (1.829 m)   Wt 229 lb (103.9 kg)   BMI 31.06 kg/m   EXAM: General appearance: alert and no distress Neck: no carotid bruit, no JVD and thyroid not enlarged, symmetric, no tenderness/mass/nodules Lungs: clear to auscultation bilaterally Heart: regular rate and rhythm Abdomen: soft, non-tender; bowel sounds normal; no masses,  no organomegaly and protuberant Extremities: extremities normal, atraumatic, no cyanosis or edema Pulses: 2+ and symmetric Skin: Skin color, texture, turgor normal. No rashes or lesions Neurologic: Grossly normal Psych: Pleasant  EKG: Normal sinus rhythm at 69 - personally reviewed  ASSESSMENT: 1. Progressive dyspnea on exertion 2. Alcohol abuse 3. Hypertension 4. Dyslipidemia  PLAN: 1.   Charles Villa as per his wife's description had worsening shortness of breath over the past couple of years. Now he can do less of his normal activities without becoming winded. Just reports his alcohol use is gone up. He has hypertension but is well controlled today and is on medication for cholesterol.  He has excess abdominal obesity which could lead to some diaphragmatic excursion problems. Other than that he has significant risk factors including age and family history for coronary artery disease and I recommended exercise treadmill stress test. We'll also get an echocardiogram to rule out any carotid myopathy possibly related to alcohol use or other reason to explain his shortness of breath with exertion. I advise cutting his alcohol use back in half to begin with and ultimately would like to wean him off of that. His wife says that he's done that before but went back to the drinking more recently. He does not seem to think that there is an issue with his alcohol use.  Thanks for the consultation. Follow-up with me afterwards  Pixie Casino, MD, [place]  Attending Cardiologist  Direct Dial:  [phone]  Fax: [phone]  Website:  [name] 02/03/2017, 5:07 PM

## 2017-02-03 NOTE — Patient Instructions (Addendum)
Bring a copy of your advance directives to your next office visit. DASH Eating Plan DASH stands for "Dietary Approaches to Stop Hypertension." The DASH eating plan is a healthy eating plan that has been shown to reduce high blood pressure (hypertension). It may also reduce your risk for type 2 diabetes, heart disease, and stroke. The DASH eating plan may also help with weight loss. What are tips for following this plan? General guidelines  Avoid eating more than 2,300 mg (milligrams) of salt (sodium) a day. If you have hypertension, you may need to reduce your sodium intake to 1,500 mg a day.  Limit alcohol intake to no more than 1 drink a day for nonpregnant women and 2 drinks a day for men. One drink equals 12 oz of beer, 5 oz of wine, or 1 oz of hard liquor.  Work with your health care provider to maintain a healthy body weight or to lose weight. Ask what an ideal weight is for you.  Get at least 30 minutes of exercise that causes your heart to beat faster (aerobic exercise) most days of the week. Activities may include walking, swimming, or biking.  Work with your health care provider or diet and nutrition specialist (dietitian) to adjust your eating plan to your individual calorie needs. Reading food labels  Check food labels for the amount of sodium per serving. Choose foods with less than 5 percent of the Daily Value of sodium. Generally, foods with less than 300 mg of sodium per serving fit into this eating plan.  To find whole grains, look for the word "whole" as the first word in the ingredient list. Shopping  Buy products labeled as "low-sodium" or "no salt added."  Buy fresh foods. Avoid canned foods and premade or frozen meals. Cooking  Avoid adding salt when cooking. Use salt-free seasonings or herbs instead of table salt or sea salt. Check with your health care provider or pharmacist before using salt substitutes.  Do not fry foods. Cook foods using healthy methods such  as baking, boiling, grilling, and broiling instead.  Cook with heart-healthy oils, such as olive, canola, soybean, or sunflower oil. Meal planning   Eat a balanced diet that includes: ? 5 or more servings of fruits and vegetables each day. At each meal, try to fill half of your plate with fruits and vegetables. ? Up to 6-8 servings of whole grains each day. ? Less than 6 oz of lean meat, poultry, or fish each day. A 3-oz serving of meat is about the same size as a deck of cards. One egg equals 1 oz. ? 2 servings of low-fat dairy each day. ? A serving of nuts, seeds, or beans 5 times each week. ? Heart-healthy fats. Healthy fats called Omega-3 fatty acids are found in foods such as flaxseeds and coldwater fish, like sardines, salmon, and mackerel.  Limit how much you eat of the following: ? Canned or prepackaged foods. ? Food that is high in trans fat, such as fried foods. ? Food that is high in saturated fat, such as fatty meat. ? Sweets, desserts, sugary drinks, and other foods with added sugar. ? Full-fat dairy products.  Do not salt foods before eating.  Try to eat at least 2 vegetarian meals each week.  Eat more home-cooked food and less restaurant, buffet, and fast food.  When eating at a restaurant, ask that your food be prepared with less salt or no salt, if possible. What foods are recommended? The items listed  may not be a complete list. Talk with your dietitian about what dietary choices are best for you. Grains Whole-grain or whole-wheat bread. Whole-grain or whole-wheat pasta. Brown rice. [place]. Bulgur. Whole-grain and low-sodium cereals. Pita bread. Low-fat, low-sodium crackers. Whole-wheat flour tortillas. Vegetables Fresh or frozen vegetables (raw, steamed, roasted, or grilled). Low-sodium or reduced-sodium tomato and vegetable juice. Low-sodium or reduced-sodium tomato sauce and tomato paste. Low-sodium or reduced-sodium canned vegetables. Fruits All  fresh, dried, or frozen fruit. Canned fruit in natural juice (without added sugar). Meat and other protein foods Skinless chicken or [place]. Ground chicken or [place]. Pork with fat trimmed off. Fish and seafood. Egg whites. Dried beans, peas, or lentils. Unsalted nuts, nut butters, and seeds. Unsalted canned beans. Lean cuts of beef with fat trimmed off. Low-sodium, lean deli meat. Dairy Low-fat (1%) or fat-free (skim) milk. Fat-free, low-fat, or reduced-fat cheeses. Nonfat, low-sodium ricotta or cottage cheese. Low-fat or nonfat yogurt. Low-fat, low-sodium cheese. Fats and oils Soft margarine without trans fats. Vegetable oil. Low-fat, reduced-fat, or light mayonnaise and salad dressings (reduced-sodium). Canola, safflower, olive, soybean, and sunflower oils. Avocado. Seasoning and other foods Herbs. Spices. Seasoning mixes without salt. Unsalted popcorn and pretzels. Fat-free sweets. What foods are not recommended? The items listed may not be a complete list. Talk with your dietitian about what dietary choices are best for you. Grains Baked goods made with fat, such as croissants, muffins, or some breads. Dry pasta or rice meal packs. Vegetables Creamed or fried vegetables. Vegetables in a cheese sauce. Regular canned vegetables (not low-sodium or reduced-sodium). Regular canned tomato sauce and paste (not low-sodium or reduced-sodium). Regular tomato and vegetable juice (not low-sodium or reduced-sodium). Angie Fava. Olives. Fruits Canned fruit in a light or heavy syrup. Fried fruit. Fruit in cream or butter sauce. Meat and other protein foods Fatty cuts of meat. Ribs. Fried meat. Berniece Salines. Sausage. Bologna and other processed lunch meats. Salami. Fatback. Hotdogs. Bratwurst. Salted nuts and seeds. Canned beans with added salt. Canned or smoked fish. Whole eggs or egg yolks. Chicken or [place] with skin. Dairy Whole or 2% milk, cream, and half-and-half. Whole or full-fat cream cheese. Whole-fat or  sweetened yogurt. Full-fat cheese. Nondairy creamers. Whipped toppings. Processed cheese and cheese spreads. Fats and oils Butter. Stick margarine. Lard. Shortening. Ghee. Bacon fat. Tropical oils, such as coconut, palm kernel, or palm oil. Seasoning and other foods Salted popcorn and pretzels. Onion salt, garlic salt, seasoned salt, table salt, and sea salt. Worcestershire sauce. Tartar sauce. Barbecue sauce. Teriyaki sauce. Soy sauce, including reduced-sodium. Steak sauce. Canned and packaged gravies. Fish sauce. Oyster sauce. Cocktail sauce. Horseradish that you find on the shelf. Ketchup. Mustard. Meat flavorings and tenderizers. Bouillon cubes. Hot sauce and Tabasco sauce. Premade or packaged marinades. Premade or packaged taco seasonings. Relishes. Regular salad dressings. Where to find more information:  National Heart, Lung, and [place]: https://wilson-eaton.com/  American Heart Association: www.heart.org Summary  The DASH eating plan is a healthy eating plan that has been shown to reduce high blood pressure (hypertension). It may also reduce your risk for type 2 diabetes, heart disease, and stroke.  With the DASH eating plan, you should limit salt (sodium) intake to 2,300 mg a day. If you have hypertension, you may need to reduce your sodium intake to 1,500 mg a day.  When on the DASH eating plan, aim to eat more fresh fruits and vegetables, whole grains, lean proteins, low-fat dairy, and heart-healthy fats.  Work with your health care provider or diet  and nutrition specialist (dietitian) to adjust your eating plan to your individual calorie needs. This information is not intended to replace advice given to you by your health care provider. Make sure you discuss any questions you have with your health care provider. Document Released: 07/04/2011 Document Revised: 07/08/2016 Document Reviewed: 07/08/2016 Elsevier Interactive Patient Education  2017 [place] Heart-healthy meal planning includes:  Limiting unhealthy fats.  Increasing healthy fats.  Making other small dietary changes.  You may need to talk with your doctor or a diet specialist (dietitian) to create an eating plan that is right for you. What types of fat should I choose?  Choose healthy fats. These include olive oil and canola oil, flaxseeds, walnuts, almonds, and seeds.  Eat more omega-3 fats. These include salmon, mackerel, sardines, tuna, flaxseed oil, and ground flaxseeds. Try to eat fish at least twice each week.  Limit saturated fats. ? Saturated fats are often found in animal products, such as meats, butter, and cream. ? Plant sources of saturated fats include palm oil, palm kernel oil, and coconut oil.  Avoid foods with partially hydrogenated oils in them. These include stick margarine, some tub margarines, cookies, crackers, and other baked goods. These contain trans fats. What general guidelines do I need to follow?  Check food labels carefully. Identify foods with trans fats or high amounts of saturated fat.  Fill one half of your plate with vegetables and green salads. Eat 4-5 servings of vegetables per day. A serving of vegetables is: ? 1 cup of raw leafy vegetables. ?  cup of raw or cooked cut-up vegetables. ?  cup of vegetable juice.  Fill one fourth of your plate with whole grains. Look for the word "whole" as the first word in the ingredient list.  Fill one fourth of your plate with lean protein foods.  Eat 4-5 servings of fruit per day. A serving of fruit is: ? One medium whole fruit. ?  cup of dried fruit. ?  cup of fresh, frozen, or canned fruit. ?  cup of 100% fruit juice.  Eat more foods that contain soluble fiber. These include apples, broccoli, carrots, beans, peas, and barley. Try to get 20-30 g of fiber per day.  Eat more home-cooked food. Eat less restaurant, buffet, and fast food.  Limit or avoid alcohol.  Limit  foods high in starch and sugar.  Avoid fried foods.  Avoid frying your food. Try baking, boiling, grilling, or broiling it instead. You can also reduce fat by: ? Removing the skin from poultry. ? Removing all visible fats from meats. ? Skimming the fat off of stews, soups, and gravies before serving them. ? Steaming vegetables in water or broth.  Lose weight if you are overweight.  Eat 4-5 servings of nuts, legumes, and seeds per week: ? One serving of dried beans or legumes equals  cup after being cooked. ? One serving of nuts equals 1 ounces. ? One serving of seeds equals  ounce or one tablespoon.  You may need to keep track of how much salt or sodium you eat. This is especially true if you have high blood pressure. Talk with your doctor or dietitian to get more information. What foods can I eat? Grains Breads, including [place], white, pita, wheat, raisin, rye, oatmeal, and [place]. Tortillas that are neither fried nor made with lard or trans fat. Low-fat rolls, including hotdog and hamburger buns and English muffins. Biscuits. Muffins. Waffles. Pancakes. Light popcorn. Whole-grain  cereals. Flatbread. Melba toast. Pretzels. Breadsticks. Rusks. Low-fat snacks. Low-fat crackers, including oyster, saltine, matzo, graham, animal, and rye. Rice and pasta, including brown rice and pastas that are made with whole wheat. Vegetables All vegetables. Fruits All fruits, but limit coconut. Meats and Other Protein Sources Lean, well-trimmed beef, veal, pork, and lamb. Chicken and [place] without skin. All fish and shellfish. Wild duck, rabbit, pheasant, and venison. Egg whites or low-cholesterol egg substitutes. Dried beans, peas, lentils, and tofu. Seeds and most nuts. Dairy Low-fat or nonfat cheeses, including ricotta, string, and mozzarella. Skim or 1% milk that is liquid, powdered, or evaporated. Buttermilk that is made with low-fat milk. Nonfat or low-fat yogurt. Beverages Mineral water.  Diet carbonated beverages. Sweets and Desserts Sherbets and fruit ices. Honey, jam, marmalade, jelly, and syrups. Meringues and gelatins. Pure sugar candy, such as hard candy, jelly beans, gumdrops, mints, marshmallows, and small amounts of dark chocolate. [employer] Eat all sweets and desserts in moderation. Fats and Oils Nonhydrogenated (trans-free) margarines. Vegetable oils, including soybean, sesame, sunflower, olive, peanut, safflower, corn, canola, and cottonseed. Salad dressings or mayonnaise made with a vegetable oil. Limit added fats and oils that you use for cooking, baking, salads, and as spreads. Other Cocoa powder. Coffee and tea. All seasonings and condiments. The items listed above may not be a complete list of recommended foods or beverages. Contact your dietitian for more options. What foods are not recommended? Grains Breads that are made with saturated or trans fats, oils, or whole milk. Croissants. Butter rolls. Cheese breads. Sweet rolls. Donuts. Buttered popcorn. Chow mein noodles. High-fat crackers, such as cheese or butter crackers. Meats and Other Protein Sources Fatty meats, such as hotdogs, short ribs, sausage, spareribs, bacon, rib eye roast or steak, and mutton. High-fat deli meats, such as salami and bologna. Caviar. Domestic duck and goose. Organ meats, such as kidney, liver, sweetbreads, and heart. Dairy Cream, sour cream, cream cheese, and creamed cottage cheese. Whole-milk cheeses, including blue (bleu), Monterey Jack, [place], [place], American, [place], Swiss, cheddar, [place], and [place]. Whole or 2% milk that is liquid, evaporated, or condensed. Whole buttermilk. Cream sauce or high-fat cheese sauce. Yogurt that is made from whole milk. Beverages Regular sodas and juice drinks with added sugar. Sweets and Desserts Frosting. Pudding. Cookies. Cakes other than angel food cake. Candy that has milk chocolate or white chocolate, hydrogenated fat, butter,  coconut, or unknown ingredients. Buttered syrups. Full-fat ice cream or ice cream drinks. Fats and Oils Gravy that has suet, meat fat, or shortening. Cocoa butter, hydrogenated oils, palm oil, coconut oil, palm kernel oil. These can often be found in baked products, candy, fried foods, nondairy creamers, and whipped toppings. Solid fats and shortenings, including bacon fat, salt pork, lard, and butter. Nondairy cream substitutes, such as coffee creamers and sour cream substitutes. Salad dressings that are made of unknown oils, cheese, or sour cream. The items listed above may not be a complete list of foods and beverages to avoid. Contact your dietitian for more information. This information is not intended to replace advice given to you by your health care provider. Make sure you discuss any questions you have with your health care provider. Document Released: 01/14/2012 Document Revised: 12/21/2015 Document Reviewed: 01/06/2014 Elsevier Interactive Patient Education  [employer].

## 2017-02-03 NOTE — Progress Notes (Signed)
I have reviewed and agree with note, evaluation, plan.   Stephen Hunter, MD  

## 2017-02-18 ENCOUNTER — Ambulatory Visit (INDEPENDENT_AMBULATORY_CARE_PROVIDER_SITE_OTHER): Payer: Medicare HMO

## 2017-02-18 ENCOUNTER — Ambulatory Visit (HOSPITAL_COMMUNITY): Payer: Medicare HMO | Attending: Cardiology

## 2017-02-18 ENCOUNTER — Other Ambulatory Visit: Payer: Self-pay

## 2017-02-18 DIAGNOSIS — I083 Combined rheumatic disorders of mitral, aortic and tricuspid valves: Secondary | ICD-10-CM | POA: Diagnosis not present

## 2017-02-18 DIAGNOSIS — I5030 Unspecified diastolic (congestive) heart failure: Secondary | ICD-10-CM | POA: Diagnosis not present

## 2017-02-18 DIAGNOSIS — R0602 Shortness of breath: Secondary | ICD-10-CM

## 2017-02-18 DIAGNOSIS — I10 Essential (primary) hypertension: Secondary | ICD-10-CM | POA: Diagnosis not present

## 2017-02-18 LAB — EXERCISE TOLERANCE TEST
CSEPEDS: 6 s
CSEPEW: 7 METS
CSEPHR: 85 %
CSEPPHR: 129 {beats}/min
Exercise duration (min): 6 min
MPHR: 151 {beats}/min
RPE: 17
Rest HR: 65 {beats}/min

## 2017-02-20 ENCOUNTER — Ambulatory Visit (INDEPENDENT_AMBULATORY_CARE_PROVIDER_SITE_OTHER): Payer: Medicare HMO | Admitting: Internal Medicine

## 2017-02-20 ENCOUNTER — Telehealth: Payer: Self-pay

## 2017-02-20 ENCOUNTER — Ambulatory Visit
Admission: RE | Admit: 2017-02-20 | Discharge: 2017-02-20 | Disposition: A | Discharge status: Home / Self Care | Payer: Medicare HMO | Source: Ambulatory Visit | Attending: Internal Medicine | Admitting: Internal Medicine

## 2017-02-20 VITALS — BP 112/76 | HR 70 | Ht 72.0 in | Wt 230.4 lb

## 2017-02-20 DIAGNOSIS — Z5181 Encounter for therapeutic drug level monitoring: Secondary | ICD-10-CM | POA: Diagnosis not present

## 2017-02-20 DIAGNOSIS — R0602 Shortness of breath: Secondary | ICD-10-CM

## 2017-02-20 DIAGNOSIS — Z01818 Encounter for other preprocedural examination: Secondary | ICD-10-CM

## 2017-02-20 DIAGNOSIS — I10 Essential (primary) hypertension: Secondary | ICD-10-CM | POA: Diagnosis not present

## 2017-02-20 DIAGNOSIS — R5383 Other fatigue: Secondary | ICD-10-CM | POA: Diagnosis not present

## 2017-02-20 DIAGNOSIS — Z7982 Long term (current) use of aspirin: Secondary | ICD-10-CM

## 2017-02-20 DIAGNOSIS — E785 Hyperlipidemia, unspecified: Secondary | ICD-10-CM | POA: Diagnosis not present

## 2017-02-20 DIAGNOSIS — R9439 Abnormal result of other cardiovascular function study: Secondary | ICD-10-CM

## 2017-02-20 DIAGNOSIS — I517 Cardiomegaly: Secondary | ICD-10-CM | POA: Diagnosis not present

## 2017-02-20 LAB — CBC
Hematocrit: 42.7 % (ref 37.5–51.0)
Hemoglobin: 14.9 g/dL (ref 13.0–17.7)
MCH: 35.1 pg — ABNORMAL HIGH (ref 26.6–33.0)
MCHC: 34.9 g/dL (ref 31.5–35.7)
MCV: 101 fL — ABNORMAL HIGH (ref 79–97)
PLATELETS: 195 10*3/uL (ref 150–379)
RBC: 4.24 x10E6/uL (ref 4.14–5.80)
RDW: 13.2 % (ref 12.3–15.4)
WBC: 5.5 10*3/uL (ref 3.4–10.8)

## 2017-02-20 LAB — BASIC METABOLIC PANEL
BUN/Creatinine Ratio: 19 (ref 10–24)
BUN: 20 mg/dL (ref 8–27)
CALCIUM: 9.4 mg/dL (ref 8.6–10.2)
CO2: 20 mmol/L (ref 20–29)
Chloride: 102 mmol/L (ref 96–106)
Creatinine, Ser: 1.05 mg/dL (ref 0.76–1.27)
GFR calc Af Amer: 83 mL/min/{1.73_m2} (ref >59)
GFR calc non Af Amer: 72 mL/min/{1.73_m2} (ref >59)
GLUCOSE: 105 mg/dL — AB (ref 65–99)
POTASSIUM: 4.1 mmol/L (ref 3.5–5.2)
SODIUM: 138 mmol/L (ref 134–144)

## 2017-02-20 LAB — APTT: APTT: 23 s — AB (ref 24–33)

## 2017-02-20 LAB — PROTIME-INR
INR: 1 (ref 0.8–1.2)
PROTHROMBIN TIME: 10.3 s (ref 9.1–12.0)

## 2017-02-20 LAB — TSH: TSH: 1.3 u[IU]/mL (ref 0.450–4.500)

## 2017-02-20 NOTE — Patient Instructions (Signed)
   [facility] [address] [place] [address] Dept: [phone] Loc: [place]  02/20/2017  You are scheduled for a Cardiac Catheterization on Monday, July 30 with Dr. [name].  1. Please arrive at the [facility] (Main Entrance A) at [facility]: [address] [place], [place] [address] at 8:00 AM (two hours before your procedure to ensure your preparation). Free valet parking service is available.   Special note: Every effort is made to have your procedure done on time. Please understand that emergencies sometimes delay scheduled procedures.  2. Diet: Do not eat or drink anything after midnight prior to your procedure except sips of water to take medications.  3. Labs: You will need to have blood drawn on Thursday, July 26 at [facility]  4. Medication instructions in preparation for your procedure:  On the morning of your procedure, take your Aspirin and any morning medicines NOT listed above.  You may use sips of water.  5. Plan for one night stay--bring personal belongings. 6. Bring a current list of your medications and current insurance cards. 7. You MUST have a responsible person to drive you home. 8. Someone MUST be with you the first 24 hours after you arrive home or your discharge will be delayed. 9. Please wear clothes that are easy to get on and off and wear slip-on shoes.  Thank you for allowing [place] to care for you!   -- [facility]  Your physician recommends that you schedule a follow-up appointment in 2-3 weeks (after cath) with Dr. Debara Pickett or APP

## 2017-02-20 NOTE — Progress Notes (Signed)
OFFICE CONSULT NOTE  Chief Complaint:  Follow-up stress test  Primary Care Physician: Marin Olp, MD  HPI:  Charles Villa is a 71 y.o. male who is being seen today for the evaluation of shortness of breath at the request of Marin Olp, MD. Charles Villa has a history of hypertension, dyslipidemia, GERD, chronic back pain, OCD and long-standing alcohol use, according to his wife who did most the talking during the visit he drinks about 2-3 drinks per day however could be more. She says that's increased more recently. She notes that over the past 2 years she's had progressively worsening shortness of breath. 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 They have a vacation house at [facility] and he has approximate 65 steps to climb up. He says he gets short of breath walking up the stairs and has been more difficult to do that without stopping. He seems to minimize his symptoms. His wife is concerned about possible coronary artery disease as a cause of this. She used to work in cardiac rehabilitation, possibly is an Physiological scientist. There is a family history of hypertension and stroke in his mother and his father who was a smoker died of lung cancer. He denies any chest pain or pressure, palpitations, presyncope or syncopal symptoms. He also denies any fever, chills, cough or other infective symptoms.  02/20/2017  Charles Villa returns today for follow-up. He underwent echocardiogram as well as an exercise treadmill stress test. The exercise treadmill stress test unfortunately was abnormal indicating ST segment depression in leads 23 aVF and V5 and V6 beginning at 5 minutes a stress returning to baseline after about 5-9 minutes of recovery. His echocardiogram demonstrated normal systolic function, mild diastolic dysfunction and some mild valvular abnormalities. I reviewed the results for him today along with his wife and my recommendations for  left heart catheterization.  PMHx:  Past Medical History:  Diagnosis Date  . Anal fissure 08/17/2012   Really more gluteal crease irritation- protosol hc 2.5% prn   . BACK PAIN, CHRONIC 02/10/2007  . Diverticulosis of colon    2007  . GERD 02/23/2007  . GERD (gastroesophageal reflux disease)   . Hyperlipidemia   . Hypertension   . OCD (obsessive compulsive disorder) 01/23/2016   Ocd/hoarding issues- psychiatristis- [place] started on zoloft 100mg --> paxil  . Personal history of colonic polyps 03/14/2014   08/2005. Benign. 10 year repeat. 2010 colonoscopy in record was entered erroneously.    [name] PSORIASIS 02/10/2007   Topical therapy.     . Scalp psoriasis   . SQUAMOUS CELL CARCINOMA OF SKIN SITE UNSPECIFIED 08/24/2010   Annotation: face     Past Surgical History:  Procedure Laterality Date  . COLONOSCOPY W/ POLYPECTOMY     2007 [name]  . SKIN CANCER EXCISION     squamous cell CA - nose  . TONSILLECTOMY      FAMHx:  Family History  Problem Relation Age of Onset  . Lung cancer Father   . Stroke Mother   . Hypertension Mother     SOCHx:   reports that he has never smoked. He has never used smokeless tobacco. He reports that he drinks about 12.6 oz of alcohol per week . He reports that he does not use drugs.  ALLERGIES:  No Known Allergies  ROS: Pertinent items noted in HPI and remainder of comprehensive ROS otherwise negative.  HOME MEDS: Current Outpatient Prescriptions on File Prior to Visit  Medication Sig Dispense Refill  . aspirin 325 MG EC tablet Take 160 mg by mouth daily.     . bisoprolol-hydrochlorothiazide (ZIAC) 2.5-6.25 MG tablet Take 1 tablet by mouth daily. 90 tablet 3  . clotrimazole-betamethasone (LOTRISONE) lotion Apply 1 application topically 2 (two) times daily. (Patient taking differently: Apply 1 application topically as needed. ) 30 mL 1  . desonide (DESONATE) 0.05 % gel Apply topically 2 (two) times daily. (Patient taking differently: Apply  topically as needed. ) 60 g 11  . famotidine (PEPCID) 40 MG tablet Take 1 tablet (40 mg total) by mouth at bedtime. (Patient taking differently: Take 40 mg by mouth as needed. ) 90 tablet 3  . fluocinolone (VANOS) 0.01 % cream Apply topically as needed.     . hydrocortisone (ANUSOL-HC) 2.5 % rectal cream Place 1 application rectally 2 (two) times daily. (Patient taking differently: Place 1 application rectally as needed. ) 30 g 1  . Krill Oil Omega-3 300 MG CAPS Take 1 capsule by mouth daily. (Patient taking differently: Take 1 capsule by mouth every other day. )    . PARoxetine (PAXIL) 30 MG tablet Take 60 mg by mouth daily.   1  . Probiotic Product (PROBIOTIC DAILY PO) Take by mouth every other day.     . simvastatin (ZOCOR) 20 MG tablet Take 1 tablet (20 mg total) by mouth daily. 90 tablet 3   No current facility-administered medications on file prior to visit.     LABS/IMAGING: No results found for this or any previous visit (from the past 48 hour(s)). No results found.  LIPID PANEL:    Component Value Date/Time   CHOL 216 (H) 08/04/2015 0903   TRIG 136.0 08/04/2015 0903   TRIG 89 08/11/2006 0818   HDL 57.70 08/04/2015 0903   CHOLHDL 4 08/04/2015 0903   VLDL 27.2 08/04/2015 0903   LDLCALC 131 (H) 08/04/2015 0903   LDLDIRECT 145.3 07/12/2013 1046    WEIGHTS: Wt Readings from Last 3 Encounters:  02/20/17 230 lb 6.4 oz (104.5 kg)  02/03/17 229 lb (103.9 kg)  02/03/17 228 lb 14.4 oz (103.8 kg)    VITALS: BP 112/76   Pulse 70   Ht 6' (1.829 m)   Wt 230 lb 6.4 oz (104.5 kg)   BMI 31.25 kg/m   EXAM: Deferred  EKG: Deferred  ASSESSMENT: 1. Progressive dyspnea on exertion - abnormal treadmill stress test, normal LVEF (01/2017) on echo 2. Alcohol abuse 3. Hypertension 4. Dyslipidemia  PLAN: 1.   Charles Villa has had progressive dyspnea on exertion and had an abnormal treadmill stress test with ST depression suggestive of ischemia. Based on his progressive symptoms  of cardiac risk factors and concern for ischemia, I'm recommending left heart catheterization. I discussed the procedure, in depth today with him and his wife including the risks, benefits and alternatives. He is agreeable to proceed. We will schedule that likely for next Monday. Plan follow-up with me afterwards.  Pixie Casino, MD, [place]  Attending Cardiologist  Direct Dial: [phone]  Fax: [phone]  Website:  [email or web address] 02/20/2017, 10:11 AM

## 2017-02-20 NOTE — Telephone Encounter (Signed)
Patient contacted pre-catheterization at [facility] scheduled for:  02/24/2017 @ 52  Spoke with wife per DPR Verified arrival time and place:  [facility] @ 0800 Confirmed AM meds to be taken pre-cath with sip of water: ASA prior to procedure.  Per Pt wife, Pt takes a half of a ASA 325 EC daily.  Notified that was fine.  Notified to hold biso/hctz combo. Confirmed patient has responsible person to drive home post procedure and observe patient for 24 hours:  yes Addl concerns:  None.  Wife is primary Landscape architect.

## 2017-02-24 ENCOUNTER — Encounter (HOSPITAL_COMMUNITY)
Admission: RE | Disposition: A | Discharge status: Home / Self Care | Payer: Self-pay | Source: Ambulatory Visit | Attending: Cardiology

## 2017-02-24 ENCOUNTER — Other Ambulatory Visit: Payer: Self-pay

## 2017-02-24 ENCOUNTER — Encounter (HOSPITAL_COMMUNITY): Payer: Self-pay | Admitting: Cardiology

## 2017-02-24 ENCOUNTER — Observation Stay (HOSPITAL_COMMUNITY)
Admission: RE | Admit: 2017-02-24 | Discharge: 2017-02-25 | Disposition: A | Discharge status: Home / Self Care | Payer: Medicare HMO | Source: Ambulatory Visit | Attending: Cardiology | Admitting: Cardiology

## 2017-02-24 DIAGNOSIS — Z823 Family history of stroke: Secondary | ICD-10-CM | POA: Diagnosis not present

## 2017-02-24 DIAGNOSIS — E785 Hyperlipidemia, unspecified: Secondary | ICD-10-CM | POA: Insufficient documentation

## 2017-02-24 DIAGNOSIS — G8929 Other chronic pain: Secondary | ICD-10-CM | POA: Diagnosis not present

## 2017-02-24 DIAGNOSIS — Z9889 Other specified postprocedural states: Secondary | ICD-10-CM

## 2017-02-24 DIAGNOSIS — R319 Hematuria, unspecified: Secondary | ICD-10-CM | POA: Diagnosis not present

## 2017-02-24 DIAGNOSIS — F1010 Alcohol abuse, uncomplicated: Secondary | ICD-10-CM | POA: Diagnosis not present

## 2017-02-24 DIAGNOSIS — F429 Obsessive-compulsive disorder, unspecified: Secondary | ICD-10-CM | POA: Diagnosis not present

## 2017-02-24 DIAGNOSIS — L409 Psoriasis, unspecified: Secondary | ICD-10-CM | POA: Insufficient documentation

## 2017-02-24 DIAGNOSIS — K219 Gastro-esophageal reflux disease without esophagitis: Secondary | ICD-10-CM | POA: Diagnosis not present

## 2017-02-24 DIAGNOSIS — Z955 Presence of coronary angioplasty implant and graft: Secondary | ICD-10-CM

## 2017-02-24 DIAGNOSIS — R0602 Shortness of breath: Secondary | ICD-10-CM

## 2017-02-24 DIAGNOSIS — Z8249 Family history of ischemic heart disease and other diseases of the circulatory system: Secondary | ICD-10-CM | POA: Insufficient documentation

## 2017-02-24 DIAGNOSIS — I10 Essential (primary) hypertension: Secondary | ICD-10-CM | POA: Diagnosis not present

## 2017-02-24 DIAGNOSIS — R0600 Dyspnea, unspecified: Secondary | ICD-10-CM | POA: Diagnosis present

## 2017-02-24 DIAGNOSIS — M549 Dorsalgia, unspecified: Secondary | ICD-10-CM | POA: Diagnosis not present

## 2017-02-24 DIAGNOSIS — R9439 Abnormal result of other cardiovascular function study: Secondary | ICD-10-CM

## 2017-02-24 DIAGNOSIS — I25119 Atherosclerotic heart disease of native coronary artery with unspecified angina pectoris: Principal | ICD-10-CM

## 2017-02-24 DIAGNOSIS — R69 Illness, unspecified: Secondary | ICD-10-CM | POA: Diagnosis not present

## 2017-02-24 DIAGNOSIS — R0609 Other forms of dyspnea: Secondary | ICD-10-CM | POA: Diagnosis not present

## 2017-02-24 DIAGNOSIS — I208 Other forms of angina pectoris: Secondary | ICD-10-CM | POA: Diagnosis present

## 2017-02-24 DIAGNOSIS — Z7982 Long term (current) use of aspirin: Secondary | ICD-10-CM | POA: Insufficient documentation

## 2017-02-24 DIAGNOSIS — Z9861 Coronary angioplasty status: Secondary | ICD-10-CM

## 2017-02-24 DIAGNOSIS — I2510 Atherosclerotic heart disease of native coronary artery without angina pectoris: Secondary | ICD-10-CM

## 2017-02-24 HISTORY — PX: LEFT HEART CATH AND CORONARY ANGIOGRAPHY: CATH118249

## 2017-02-24 HISTORY — DX: Atherosclerotic heart disease of native coronary artery without angina pectoris: I25.10

## 2017-02-24 HISTORY — PX: CORONARY STENT INTERVENTION: CATH118234

## 2017-02-24 LAB — POCT ACTIVATED CLOTTING TIME
ACTIVATED CLOTTING TIME: 367 s
Activated Clotting Time: 257 s

## 2017-02-24 LAB — CBC
HCT: 41.5 % (ref 39.0–52.0)
HEMOGLOBIN: 14 g/dL (ref 13.0–17.0)
MCH: 34.3 pg — AB (ref 26.0–34.0)
MCHC: 33.7 g/dL (ref 30.0–36.0)
MCV: 101.7 fL — AB (ref 78.0–100.0)
PLATELETS: 172 10*3/uL (ref 150–400)
RBC: 4.08 MIL/uL — AB (ref 4.22–5.81)
RDW: 13 % (ref 11.5–15.5)
WBC: 6.1 10*3/uL (ref 4.0–10.5)

## 2017-02-24 SURGERY — LEFT HEART CATH AND CORONARY ANGIOGRAPHY
Anesthesia: LOCAL

## 2017-02-24 MED ORDER — HYDRALAZINE HCL 20 MG/ML IJ SOLN: 5.0000 mg | INTRAMUSCULAR | Status: DC | PRN

## 2017-02-24 MED ORDER — HEPARIN (PORCINE) IN NACL 2-0.9 UNIT/ML-% IJ SOLN
INTRAMUSCULAR | Status: AC | PRN
  Administered 2017-02-24: 1000 mL

## 2017-02-24 MED ORDER — SODIUM CHLORIDE 0.9% FLUSH: 3.0000 mL | Freq: Two times a day (BID) | INTRAVENOUS | Status: DC

## 2017-02-24 MED ORDER — ASPIRIN 81 MG PO CHEW: 81.0000 mg | CHEWABLE_TABLET | ORAL | 0 refills | Status: DC

## 2017-02-24 MED ORDER — ASPIRIN 81 MG PO CHEW
81.0000 mg | CHEWABLE_TABLET | Freq: Every day | ORAL | Status: DC
  Administered 2017-02-25: 81 mg via ORAL
  Filled 2017-02-24: qty 1

## 2017-02-24 MED ORDER — ANGIOPLASTY BOOK
Freq: Once | Status: DC
  Filled 2017-02-24: qty 1

## 2017-02-24 MED ORDER — SODIUM CHLORIDE 0.9 % IV SOLN: 250.0000 mL | INTRAVENOUS | Status: DC | PRN

## 2017-02-24 MED ORDER — IOPAMIDOL (ISOVUE-370) INJECTION 76%
INTRAVENOUS | Status: DC | PRN
  Administered 2017-02-24: 140 mL via INTRA_ARTERIAL

## 2017-02-24 MED ORDER — LIDOCAINE HCL (PF) 1 % IJ SOLN
INTRAMUSCULAR | Status: DC | PRN
  Administered 2017-02-24: 3 mL via INTRADERMAL

## 2017-02-24 MED ORDER — SODIUM CHLORIDE 0.9 % IV SOLN: INTRAVENOUS | Status: AC

## 2017-02-24 MED ORDER — HYDRALAZINE HCL 20 MG/ML IJ SOLN
INTRAMUSCULAR | Status: AC
  Administered 2017-02-24: 5 mg via INTRAVENOUS
  Filled 2017-02-24: qty 1

## 2017-02-24 MED ORDER — NITROGLYCERIN 1 MG/10 ML FOR IR/CATH LAB
INTRA_ARTERIAL | Status: AC
  Filled 2017-02-24: qty 10

## 2017-02-24 MED ORDER — ACETAMINOPHEN 325 MG PO TABS: 650.0000 mg | ORAL_TABLET | ORAL | Status: DC | PRN

## 2017-02-24 MED ORDER — BISOPROLOL-HYDROCHLOROTHIAZIDE 2.5-6.25 MG PO TABS: 1.0000 | ORAL_TABLET | Freq: Every day | ORAL | Status: DC

## 2017-02-24 MED ORDER — HEPARIN SODIUM (PORCINE) 1000 UNIT/ML IJ SOLN
INTRAMUSCULAR | Status: DC | PRN
  Administered 2017-02-24: 6000 [IU] via INTRAVENOUS
  Administered 2017-02-24: 5000 [IU] via INTRAVENOUS
  Administered 2017-02-24: 3000 [IU] via INTRAVENOUS

## 2017-02-24 MED ORDER — LABETALOL HCL 5 MG/ML IV SOLN: 10.0000 mg | INTRAVENOUS | Status: AC | PRN

## 2017-02-24 MED ORDER — SODIUM CHLORIDE 0.9% FLUSH
3.0000 mL | Freq: Two times a day (BID) | INTRAVENOUS | Status: DC
  Administered 2017-02-24 – 2017-02-25 (×2): 3 mL via INTRAVENOUS

## 2017-02-24 MED ORDER — NITROGLYCERIN 0.4 MG SL SUBL: 0.4000 mg | SUBLINGUAL_TABLET | SUBLINGUAL | Status: DC | PRN

## 2017-02-24 MED ORDER — SODIUM CHLORIDE 0.9% FLUSH: 3.0000 mL | INTRAVENOUS | Status: DC | PRN

## 2017-02-24 MED ORDER — CLOPIDOGREL BISULFATE 300 MG PO TABS
ORAL_TABLET | ORAL | Status: AC
  Filled 2017-02-24: qty 2

## 2017-02-24 MED ORDER — SODIUM CHLORIDE 0.9 % IV SOLN
INTRAVENOUS | Status: DC
  Administered 2017-02-24: 09:00:00 via INTRAVENOUS

## 2017-02-24 MED ORDER — BISOPROLOL-HYDROCHLOROTHIAZIDE 2.5-6.25 MG PO TABS
1.0000 | ORAL_TABLET | Freq: Every day | ORAL | Status: DC
  Administered 2017-02-25: 1 via ORAL
  Filled 2017-02-24: qty 1

## 2017-02-24 MED ORDER — ONDANSETRON HCL 4 MG/2ML IJ SOLN: 4.0000 mg | Freq: Four times a day (QID) | INTRAMUSCULAR | Status: DC | PRN

## 2017-02-24 MED ORDER — MIDAZOLAM HCL 2 MG/2ML IJ SOLN
INTRAMUSCULAR | Status: AC
  Filled 2017-02-24: qty 2

## 2017-02-24 MED ORDER — SODIUM CHLORIDE 0.9 % IV SOLN
INTRAVENOUS | Status: DC
  Administered 2017-02-24: 1 mL via INTRAVENOUS

## 2017-02-24 MED ORDER — CLOPIDOGREL BISULFATE 75 MG PO TABS: 75.0000 mg | ORAL_TABLET | Freq: Every day | ORAL | 2 refills | Status: DC

## 2017-02-24 MED ORDER — CLOPIDOGREL BISULFATE 300 MG PO TABS
ORAL_TABLET | ORAL | Status: DC | PRN
  Administered 2017-02-24: 600 mg via ORAL

## 2017-02-24 MED ORDER — VERAPAMIL HCL 2.5 MG/ML IV SOLN
INTRAVENOUS | Status: DC | PRN
  Administered 2017-02-24: 12:00:00 via INTRA_ARTERIAL

## 2017-02-24 MED ORDER — CLOPIDOGREL BISULFATE 75 MG PO TABS: 75.0000 mg | ORAL_TABLET | Freq: Every day | ORAL | Status: DC

## 2017-02-24 MED ORDER — NITROGLYCERIN 0.4 MG SL SUBL: 0.4000 mg | SUBLINGUAL_TABLET | SUBLINGUAL | 3 refills | Status: DC | PRN

## 2017-02-24 MED ORDER — MORPHINE SULFATE (PF) 2 MG/ML IV SOLN: 2.0000 mg | INTRAVENOUS | Status: DC | PRN

## 2017-02-24 MED ORDER — CLOPIDOGREL BISULFATE 75 MG PO TABS
75.0000 mg | ORAL_TABLET | Freq: Every day | ORAL | Status: DC
  Administered 2017-02-25: 75 mg via ORAL
  Filled 2017-02-24: qty 1

## 2017-02-24 MED ORDER — PAROXETINE HCL 30 MG PO TABS
30.0000 mg | ORAL_TABLET | Freq: Every day | ORAL | Status: DC
  Administered 2017-02-25: 30 mg via ORAL
  Filled 2017-02-24: qty 1

## 2017-02-24 MED ORDER — FENTANYL CITRATE (PF) 100 MCG/2ML IJ SOLN
INTRAMUSCULAR | Status: AC
  Filled 2017-02-24: qty 2

## 2017-02-24 MED ORDER — LABETALOL HCL 5 MG/ML IV SOLN: 10.0000 mg | INTRAVENOUS | Status: DC | PRN

## 2017-02-24 MED ORDER — HYDRALAZINE HCL 20 MG/ML IJ SOLN
5.0000 mg | INTRAMUSCULAR | Status: AC | PRN
  Administered 2017-02-24: 5 mg via INTRAVENOUS

## 2017-02-24 MED ORDER — IOPAMIDOL (ISOVUE-370) INJECTION 76%
INTRAVENOUS | Status: AC
  Filled 2017-02-24: qty 100

## 2017-02-24 MED ORDER — HEPARIN SODIUM (PORCINE) 1000 UNIT/ML IJ SOLN
INTRAMUSCULAR | Status: AC
  Filled 2017-02-24: qty 1

## 2017-02-24 MED ORDER — MIDAZOLAM HCL 2 MG/2ML IJ SOLN
INTRAMUSCULAR | Status: DC | PRN
  Administered 2017-02-24: 1 mg via INTRAVENOUS

## 2017-02-24 MED ORDER — LIDOCAINE HCL (PF) 1 % IJ SOLN
INTRAMUSCULAR | Status: AC
  Filled 2017-02-24: qty 30

## 2017-02-24 MED ORDER — ASPIRIN 81 MG PO CHEW: 81.0000 mg | CHEWABLE_TABLET | Freq: Every day | ORAL | Status: DC

## 2017-02-24 MED ORDER — FENTANYL CITRATE (PF) 100 MCG/2ML IJ SOLN
INTRAMUSCULAR | Status: DC | PRN
  Administered 2017-02-24: 25 ug via INTRAVENOUS

## 2017-02-24 MED ORDER — VERAPAMIL HCL 2.5 MG/ML IV SOLN
INTRAVENOUS | Status: AC
  Filled 2017-02-24: qty 2

## 2017-02-24 MED ORDER — NITROGLYCERIN 1 MG/10 ML FOR IR/CATH LAB
INTRA_ARTERIAL | Status: DC | PRN
  Administered 2017-02-24: 200 ug via INTRACORONARY

## 2017-02-24 MED ORDER — CLOPIDOGREL BISULFATE 75 MG PO TABS: 75.0000 mg | ORAL_TABLET | Freq: Every day | ORAL | 0 refills | Status: DC

## 2017-02-24 MED ORDER — MORPHINE SULFATE (PF) 4 MG/ML IV SOLN: 2.0000 mg | INTRAVENOUS | Status: DC | PRN

## 2017-02-24 MED ORDER — ASPIRIN 81 MG PO CHEW: 81.0000 mg | CHEWABLE_TABLET | ORAL | Status: DC

## 2017-02-24 MED ORDER — SODIUM CHLORIDE 0.9% FLUSH
3.0000 mL | Freq: Two times a day (BID) | INTRAVENOUS | Status: DC
  Administered 2017-02-24: 3 mL via INTRAVENOUS

## 2017-02-24 MED ORDER — HEPARIN (PORCINE) IN NACL 2-0.9 UNIT/ML-% IJ SOLN
INTRAMUSCULAR | Status: AC
  Filled 2017-02-24: qty 1000

## 2017-02-24 MED ORDER — SIMVASTATIN 20 MG PO TABS
20.0000 mg | ORAL_TABLET | Freq: Every day | ORAL | Status: DC
  Administered 2017-02-24: 20 mg via ORAL
  Filled 2017-02-24 (×2): qty 1

## 2017-02-24 MED FILL — ASPIRIN 81 MG CHEWABLE TAB: 81 | 30 days supply | Qty: 30 | Fill #0

## 2017-02-24 MED FILL — CLOPIDOGREL 75 MG TABLET: 75 | 30 days supply | Qty: 30 | Fill #0

## 2017-02-24 SURGICAL SUPPLY — 19 items
BALLN SAPPHIRE 3.0X12 (BALLOONS) ×2
BALLN ~~LOC~~ EUPHORA RX 3.5X12 (BALLOONS) ×2
BALLOON SAPPHIRE 3.0X12 (BALLOONS) ×1 IMPLANT
BALLOON ~~LOC~~ EUPHORA RX 3.5X12 (BALLOONS) ×1 IMPLANT
CATH INFINITI 5FR ANG PIGTAIL (CATHETERS) ×2 IMPLANT
CATH OPTITORQUE TIG 4.0 5F (CATHETERS) ×2 IMPLANT
CATH VISTA GUIDE 6FR XBLAD3.5 (CATHETERS) ×2 IMPLANT
DEVICE RAD COMP TR BAND LRG (VASCULAR PRODUCTS) ×2 IMPLANT
GLIDESHEATH SLEND A-KIT 6F 22G (SHEATH) ×2 IMPLANT
GUIDEWIRE INQWIRE 1.5J.035X260 (WIRE) ×1 IMPLANT
INQWIRE 1.5J .035X260CM (WIRE) ×2
KIT ENCORE 26 ADVANTAGE (KITS) ×2 IMPLANT
KIT HEART LEFT (KITS) ×2 IMPLANT
PACK CARDIAC CATHETERIZATION (CUSTOM PROCEDURE TRAY) ×2 IMPLANT
STENT PROMUS PREM MR 3.5X16 (Permanent Stent) ×2 IMPLANT
TRANSDUCER W/STOPCOCK (MISCELLANEOUS) ×2 IMPLANT
TUBING CIL FLEX 10 FLL-RA (TUBING) ×2 IMPLANT
VALVE GUARDIAN II ~~LOC~~ HEMO (MISCELLANEOUS) ×2 IMPLANT
WIRE ASAHI PROWATER 180CM (WIRE) ×2 IMPLANT

## 2017-02-24 NOTE — Progress Notes (Addendum)
    Patient was going to be discharged but then nurse called that he is having hematuria with urination. Will observe overnight, no beds available in 6c. Pt placed in obs and admitted under Dr. Darcus Pester name. Will recheck a CBC in the AM and resume home meds.   Linus Mako, PA-C 02/24/2017 6:06pm

## 2017-02-24 NOTE — Progress Notes (Signed)
Report called and transported to 2-c-10 via w/c with cardiac monitor

## 2017-02-24 NOTE — H&P (View-Only) (Signed)
OFFICE CONSULT NOTE  Chief Complaint:  Follow-up stress test  Primary Care Physician: Marin Olp, MD  HPI:  Charles Villa is a 71 y.o. male who is being seen today for the evaluation of shortness of breath at the request of Marin Olp, MD. Charles Villa has a history of hypertension, dyslipidemia, GERD, chronic back pain, OCD and long-standing alcohol use, according to his wife who did most the talking during the visit he drinks about 2-3 drinks per day however could be more. She says that's increased more recently. She notes that over the past 2 years she's had progressively worsening shortness of breath. 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 They have a vacation house at [facility] and he has approximate 65 steps to climb up. He says he gets short of breath walking up the stairs and has been more difficult to do that without stopping. He seems to minimize his symptoms. His wife is concerned about possible coronary artery disease as a cause of this. She used to work in cardiac rehabilitation, possibly is an Physiological scientist. There is a family history of hypertension and stroke in his mother and his father who was a smoker died of lung cancer. He denies any chest pain or pressure, palpitations, presyncope or syncopal symptoms. He also denies any fever, chills, cough or other infective symptoms.  02/20/2017  Charles Villa returns today for follow-up. He underwent echocardiogram as well as an exercise treadmill stress test. The exercise treadmill stress test unfortunately was abnormal indicating ST segment depression in leads 23 aVF and V5 and V6 beginning at 5 minutes a stress returning to baseline after about 5-9 minutes of recovery. His echocardiogram demonstrated normal systolic function, mild diastolic dysfunction and some mild valvular abnormalities. I reviewed the results for him today along with his wife and my recommendations for  left heart catheterization.  PMHx:  Past Medical History:  Diagnosis Date  . Anal fissure 08/17/2012   Really more gluteal crease irritation- protosol hc 2.5% prn   . BACK PAIN, CHRONIC 02/10/2007  . Diverticulosis of colon    2007  . GERD 02/23/2007  . GERD (gastroesophageal reflux disease)   . Hyperlipidemia   . Hypertension   . OCD (obsessive compulsive disorder) 01/23/2016   Ocd/hoarding issues- psychiatristis- [place] started on zoloft 100mg --> paxil  . Personal history of colonic polyps 03/14/2014   08/2005. Benign. 10 year repeat. 2010 colonoscopy in record was entered erroneously.    [name] PSORIASIS 02/10/2007   Topical therapy.     . Scalp psoriasis   . SQUAMOUS CELL CARCINOMA OF SKIN SITE UNSPECIFIED 08/24/2010   Annotation: face     Past Surgical History:  Procedure Laterality Date  . COLONOSCOPY W/ POLYPECTOMY     2007 [name]  . SKIN CANCER EXCISION     squamous cell CA - nose  . TONSILLECTOMY      FAMHx:  Family History  Problem Relation Age of Onset  . Lung cancer Father   . Stroke Mother   . Hypertension Mother     SOCHx:   reports that he has never smoked. He has never used smokeless tobacco. He reports that he drinks about 12.6 oz of alcohol per week . He reports that he does not use drugs.  ALLERGIES:  No Known Allergies  ROS: Pertinent items noted in HPI and remainder of comprehensive ROS otherwise negative.  HOME MEDS: Current Outpatient Prescriptions on File Prior to Visit  Medication Sig Dispense Refill  . aspirin 325 MG EC tablet Take 160 mg by mouth daily.     . bisoprolol-hydrochlorothiazide (ZIAC) 2.5-6.25 MG tablet Take 1 tablet by mouth daily. 90 tablet 3  . clotrimazole-betamethasone (LOTRISONE) lotion Apply 1 application topically 2 (two) times daily. (Patient taking differently: Apply 1 application topically as needed. ) 30 mL 1  . desonide (DESONATE) 0.05 % gel Apply topically 2 (two) times daily. (Patient taking differently: Apply  topically as needed. ) 60 g 11  . famotidine (PEPCID) 40 MG tablet Take 1 tablet (40 mg total) by mouth at bedtime. (Patient taking differently: Take 40 mg by mouth as needed. ) 90 tablet 3  . fluocinolone (VANOS) 0.01 % cream Apply topically as needed.     . hydrocortisone (ANUSOL-HC) 2.5 % rectal cream Place 1 application rectally 2 (two) times daily. (Patient taking differently: Place 1 application rectally as needed. ) 30 g 1  . Krill Oil Omega-3 300 MG CAPS Take 1 capsule by mouth daily. (Patient taking differently: Take 1 capsule by mouth every other day. )    . PARoxetine (PAXIL) 30 MG tablet Take 60 mg by mouth daily.   1  . Probiotic Product (PROBIOTIC DAILY PO) Take by mouth every other day.     . simvastatin (ZOCOR) 20 MG tablet Take 1 tablet (20 mg total) by mouth daily. 90 tablet 3   No current facility-administered medications on file prior to visit.     LABS/IMAGING: No results found for this or any previous visit (from the past 48 hour(s)). No results found.  LIPID PANEL:    Component Value Date/Time   CHOL 216 (H) 08/04/2015 0903   TRIG 136.0 08/04/2015 0903   TRIG 89 08/11/2006 0818   HDL 57.70 08/04/2015 0903   CHOLHDL 4 08/04/2015 0903   VLDL 27.2 08/04/2015 0903   LDLCALC 131 (H) 08/04/2015 0903   LDLDIRECT 145.3 07/12/2013 1046    WEIGHTS: Wt Readings from Last 3 Encounters:  02/20/17 230 lb 6.4 oz (104.5 kg)  02/03/17 229 lb (103.9 kg)  02/03/17 228 lb 14.4 oz (103.8 kg)    VITALS: BP 112/76   Pulse 70   Ht 6' (1.829 m)   Wt 230 lb 6.4 oz (104.5 kg)   BMI 31.25 kg/m   EXAM: Deferred  EKG: Deferred  ASSESSMENT: 1. Progressive dyspnea on exertion - abnormal treadmill stress test, normal LVEF (01/2017) on echo 2. Alcohol abuse 3. Hypertension 4. Dyslipidemia  PLAN: 1.   Charles Villa has had progressive dyspnea on exertion and had an abnormal treadmill stress test with ST depression suggestive of ischemia. Based on his progressive symptoms  of cardiac risk factors and concern for ischemia, I'm recommending left heart catheterization. I discussed the procedure, in depth today with him and his wife including the risks, benefits and alternatives. He is agreeable to proceed. We will schedule that likely for next Monday. Plan follow-up with me afterwards.  Pixie Casino, MD, [place]  Attending Cardiologist  Direct Dial: [phone]  Fax: [phone]  Website:  [email or web address] [email or web address] Hobday 02/20/2017, 10:11 AM

## 2017-02-24 NOTE — Discharge Summary (Addendum)
Discharge Summary    Patient ID: Charles Villa,  MRN: [id number], DOB/AGE: 1945/09/17 71 y.o.  Admit date: 02/24/2017 Discharge date: 02/25/2017  Primary Care Provider: Marin Olp Primary Cardiologist: [facility]  Discharge Diagnoses    Principal Problem:   Abnormal cardiovascular stress test Active Problems:   Hyperlipidemia with target low density lipoprotein (LDL) cholesterol less than 70 mg/dL   Exertional dyspnea   CAD S/P percutaneous coronary angioplasty   Coronary artery disease involving native coronary artery of native heart with angina pectoris (HCC)   Angina of effort ([place])   S/P cardiac catheterization   Allergies No Known Allergies  Diagnostic Studies/Procedures    LHC: 02/24/17  Conclusion     Mid Cx lesion, 90 %stenosed.  A STENT PROMUS PREM MR 3.5X16 drug eluting stent was successfully placed.  Post intervention, there is a 0% residual stenosis.  _______________  Moderate disease in the ostial and mid LAD with proximal-mid ectasia  The left ventricular systolic function is normal. The left ventricular ejection fraction is 55-65% by visual estimate.  LV end diastolic pressure is normal.   Severe focal single vessel disease involving the mid circumflex just prior to large lateral OM branch. Successful PCI DES.  Plan:  Same-day discharged today on dual antiplatelet therapy for minimum one year.  Patient will likely need titration of his anti-hypertensive agents as well as cardiac risk factor modification.  He will follow with Dr. Debara Pickett.  [name], M.D., M.S.  _____________   History of Present Illness     Charles Villa is a 71 y.o. male who was seen in the office for the evaluation of shortness of breath at the request of Marin Olp, MD. Mr. Mans has a history of hypertension, dyslipidemia, GERD, chronic back pain, OCD and long-standing alcohol use. His wife reported over the past 2 years he's had  progressively worsening shortness of breath. 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 They have a vacation house at [facility] and he has approximate 65 steps to climb up. He said he gets short of breath walking up the stairs and has been more difficult to do that without stopping. He seemed to minimize his symptoms. His wife was concerned about possible coronary artery disease as a cause of this. She used to work in cardiac rehabilitation, possibly is an Physiological scientist. There is a family history of hypertension and stroke in his mother and his father who was a smoker died of lung cancer. He denies any chest pain or pressure, palpitations, presyncope or syncopal symptoms. He also denies any fever, chills, cough or other infective symptoms. He underwent exercise treadmill that was abnormal with ST segment depression 23 aVF, v5,v6 at 5 minutes. Echo showed normal systolic function with mild diastolic dysfunction. Given these findings he was referred for cardiac catheterization.   Hospital Course     Underwent LHC with Dr. Ellyn Hack on 02/24/17 noted above with PCI/DES x1 to the mLcx. Plan for DAPT with ASA/plavix for at least one year. He was seen by cardiac rehab in short stay. No complications noted post cath. Radial site stable. While in short stay, patient noted hematuria post cath. UA noted large amount hemoglobin. He was admitted overnight for observation. No hematuria noted the following day. Patient felt this may been related to lesion on his scrotum. He was continued on medications without any changes. Instructions/post cath restrictions given prior to discharge. Follow up in the office has been  arranged. Instructed to follow up with PCP regarding repeat UA in a week.   He was seen by Dr. Irish Lack and determined stable for discharge home. Follow up in the office has been arranged. Medications are listed below.  _____________  Discharge Vitals Blood pressure  125/72, pulse 64, temperature 98.1 F (36.7 C), temperature source Oral, resp. rate 19, height 6' (1.829 m), weight 231 lb (104.8 kg), SpO2 97 %.  Filed Weights   02/24/17 0817  Weight: 231 lb (104.8 kg)    Labs & Radiologic Studies    CBC  Recent Labs  02/24/17 2042 02/25/17 0311  WBC 6.1 6.4  HGB 14.0 13.2  HCT 41.5 39.1  MCV 101.7* 101.8*  PLT 172 093   Basic Metabolic Panel  Recent Labs  02/25/17 0311  NA 135  K 3.6  CL 104  CO2 25  GLUCOSE 109*  BUN 11  CREATININE 0.96  CALCIUM 8.4*   Liver Function Tests No results for input(s): AST, ALT, ALKPHOS, BILITOT, PROT, ALBUMIN in the last 72 hours. No results for input(s): LIPASE, AMYLASE in the last 72 hours. Cardiac Enzymes No results for input(s): CKTOTAL, CKMB, CKMBINDEX, TROPONINI in the last 72 hours. BNP Invalid input(s): POCBNP D-Dimer No results for input(s): DDIMER in the last 72 hours. Hemoglobin A1C No results for input(s): HGBA1C in the last 72 hours. Fasting Lipid Panel No results for input(s): CHOL, HDL, LDLCALC, TRIG, CHOLHDL, LDLDIRECT in the last 72 hours. Thyroid Function Tests No results for input(s): TSH, T4TOTAL, T3FREE, THYROIDAB in the last 72 hours.  Invalid input(s): FREET3 _____________  Dg Chest 2 View  Result Date: 02/20/2017 CLINICAL DATA:  Pre cardiac catheterization evaluation . EXAM: CHEST  2 VIEW COMPARISON:  No prior . FINDINGS: Mediastinum and hilar structures normal. Cardiomegaly with normal pulmonary vascularity. No focal infiltrate. No pleural effusion or pneumothorax. No acute bony abnormality . IMPRESSION: Cardiomegaly. No pulmonary venous congestion. No acute pulmonary disease. Electronically Signed   By: Marcello Moores  Register   On: 02/20/2017 13:17   Disposition   Pt is being discharged home today in good condition.  Follow-up Plans & Appointments    Follow-up Information    Burtis Junes, NP Follow up on 03/03/2017.   Specialties:  Nurse Practitioner,  Interventional Cardiology, Cardiology, Radiology Why:  at 1130 AM for follow up  Contact information: [place]. [address] [place] [address] [phone]        Marin Olp, MD Follow up.   Specialty:  Family Medicine Why:  please follow up with your PCP regarding repeat urine testing.  Contact information: [place] [address] [phone]          Discharge Instructions    AMB Referral to Cardiac Rehabilitation - Phase II    Complete by:  As directed    Diagnosis:  Coronary Stents   Amb Referral to Cardiac Rehabilitation    Complete by:  As directed    Diagnosis:  Coronary Stents   Call MD for:  redness, tenderness, or signs of infection (pain, swelling, redness, odor or green/yellow discharge around incision site)    Complete by:  As directed    Diet - low sodium heart healthy    Complete by:  As directed    Discharge instructions    Complete by:  As directed    Radial Site Care Refer to this sheet in the next few weeks. These instructions provide you with information on caring for yourself after  your procedure. Your caregiver may also give you more specific instructions. Your treatment has been planned according to current medical practices, but problems sometimes occur. Call your caregiver if you have any problems or questions after your procedure. HOME CARE INSTRUCTIONS You may shower the day after the procedure.Remove the bandage (dressing) and gently wash the site with plain soap and water.Gently pat the site dry.  Do not apply powder or lotion to the site.  Do not submerge the affected site in water for 3 to 5 days.  Inspect the site at least twice daily.  Do not flex or bend the affected arm for 24 hours.  No lifting over 5 pounds (2.3 kg) for 5 days after your procedure.  Do not drive home if you are discharged the same day of the procedure. Have someone else drive you.  You may drive 24 hours after the procedure  unless otherwise instructed by your caregiver.  What to expect: Any bruising will usually fade within 1 to 2 weeks.  Blood that collects in the tissue (hematoma) may be painful to the touch. It should usually decrease in size and tenderness within 1 to 2 weeks.  SEEK IMMEDIATE MEDICAL CARE IF: You have unusual pain at the radial site.  You have redness, warmth, swelling, or pain at the radial site.  You have drainage (other than a small amount of blood on the dressing).  You have chills.  You have a fever or persistent symptoms for more than 72 hours.  You have a fever and your symptoms suddenly get worse.  Your arm becomes pale, cool, tingly, or numb.  You have heavy bleeding from the site. Hold pressure on the site.   PLEASE DO NOT MISS ANY DOSES OF YOUR PLAVIX!!! Check your blood pressure at home and bring log to follow up appt. Please call with any questions.   Increase activity slowly    Complete by:  As directed       Discharge Medications   Current Discharge Medication List    START taking these medications   Details  aspirin 81 MG chewable tablet Chew 1 tablet (81 mg total) by mouth daily. Qty: 30 tablet, Refills: 0    clopidogrel (PLAVIX) 75 MG tablet Take 1 tablet (75 mg total) by mouth daily with breakfast. Qty: 90 tablet, Refills: 2    nitroGLYCERIN (NITROSTAT) 0.4 MG SL tablet Place 1 tablet (0.4 mg total) under the tongue every 5 (five) minutes as needed. Qty: 25 tablet, Refills: 3      CONTINUE these medications which have NOT CHANGED   Details  bisoprolol-hydrochlorothiazide (ZIAC) 2.5-6.25 MG tablet Take 1 tablet by mouth daily. Qty: 90 tablet, Refills: 3    Krill Oil Omega-3 300 MG CAPS Take 1 capsule by mouth daily.    PARoxetine (PAXIL) 30 MG tablet Take 60 mg by mouth daily.  Refills: 1    Probiotic Product (PROBIOTIC DAILY PO) Take by mouth every other day.     simvastatin (ZOCOR) 20 MG tablet Take 1 tablet (20 mg total) by mouth daily. Qty: 90  tablet, Refills: 3    clotrimazole-betamethasone (LOTRISONE) lotion Apply 1 application topically 2 (two) times daily. Qty: 30 mL, Refills: 1    desonide (DESONATE) 0.05 % gel Apply topically 2 (two) times daily. Qty: 60 g, Refills: 11   Associated Diagnoses: Psoriasis    fluocinolone (VANOS) 0.01 % cream Apply 1 application topically daily as needed.     hydrocortisone (ANUSOL-HC) 2.5 % rectal cream  Place 1 application rectally 2 (two) times daily. Qty: 30 g, Refills: 1      STOP taking these medications     famotidine (PEPCID) 40 MG tablet          Aspirin prescribed at discharge?  Yes High Intensity Statin Prescribed? (Lipitor 40-80mg  or Crestor 20-40mg ): No: Consider switching at follow up appt.  Beta Blocker Prescribed? Yes For EF <40%, was ACEI/ARB Prescribed? No: EF ok ADP Receptor Inhibitor Prescribed? (i.e. Plavix etc.-Includes Medically Managed Patients): Yes For EF <40%, Aldosterone Inhibitor Prescribed? No: EF ok Was EF assessed during THIS hospitalization? Yes Was Cardiac Rehab II ordered? (Included Medically managed Patients): Yes   Outstanding Labs/Studies   N/a  Duration of Discharge Encounter   Greater than 30 minutes including physician time.  Signed, Reino Bellis NP-C 02/25/2017, 12:13 PM  I have examined the patient and reviewed assessment and plan and discussed with patient.  Agree with above as stated.  No CP.  Right wrist stable.  COntinue  DAPT.  Hematuria noted although the patient thinks it was a scrotal abrasion related to psoriasis that caused the bleeding, and not from his bladder.  He can have a f/u appt with his PCP where u/a is checked.  If positive for blood, would need urology f/u. Patient reports grossly clear urine this AM , much improved from yesterday where he could see blood in the urinal.  Larae Grooms

## 2017-02-24 NOTE — Progress Notes (Signed)
[id number] Discussed importance of plavix with stent. Reviewed NTG use, risk factors, ex ed and heart healthy diet. Discussed CRP 2 and will refer to [place]. Encouraged to continue to decrease ETOH consumption. Pharmacist in to give pt plavix.  Wife also present for ed and both voiced understanding. Graylon Good RN BSN 02/24/2017 3:23 PM

## 2017-02-24 NOTE — Progress Notes (Signed)
Shown angioplasty film; cardiac rehab nurse in

## 2017-02-24 NOTE — Progress Notes (Signed)
Client c/o burning with urination and small amt bright red blood noted in urinal and Cecilie Kicks ,PA notified and orders noted

## 2017-02-24 NOTE — Interval H&P Note (Signed)
History and Physical Interval Note:  02/24/2017 11:45 AM  [name]  has presented today for surgery, with the diagnosis of shortness of breath - abnormal stress test  The various methods of treatment have been discussed with the patient and family. After consideration of risks, benefits and other options for treatment, the patient has consented to  Procedure(s): Left Heart Cath and Coronary Angiography (N/A) with possible Percutaneous Coronary Intervention as a surgical intervention .  The patient's history has been reviewed, patient examined, no change in status, stable for surgery.  I have reviewed the patient's chart and labs.  Questions were answered to the patient's satisfaction.    Cath Lab Visit (complete for each Cath Lab visit)  Clinical Evaluation Leading to the Procedure:   ACS: No.  Non-ACS:    Anginal Classification: CCS II  Anti-ischemic medical therapy: Minimal Therapy (1 class of medications)  Non-Invasive Test Results: Intermediate-risk stress test findings: cardiac mortality 1-3%/year  Prior CABG: No previous CABG   Charles Villa

## 2017-02-25 ENCOUNTER — Encounter (HOSPITAL_COMMUNITY): Payer: Self-pay | Admitting: Cardiology

## 2017-02-25 DIAGNOSIS — Z9889 Other specified postprocedural states: Secondary | ICD-10-CM | POA: Diagnosis not present

## 2017-02-25 DIAGNOSIS — R0609 Other forms of dyspnea: Secondary | ICD-10-CM | POA: Diagnosis not present

## 2017-02-25 DIAGNOSIS — I2510 Atherosclerotic heart disease of native coronary artery without angina pectoris: Secondary | ICD-10-CM

## 2017-02-25 DIAGNOSIS — Z9861 Coronary angioplasty status: Secondary | ICD-10-CM | POA: Diagnosis not present

## 2017-02-25 DIAGNOSIS — R319 Hematuria, unspecified: Secondary | ICD-10-CM

## 2017-02-25 DIAGNOSIS — G8929 Other chronic pain: Secondary | ICD-10-CM | POA: Diagnosis not present

## 2017-02-25 DIAGNOSIS — I10 Essential (primary) hypertension: Secondary | ICD-10-CM | POA: Diagnosis not present

## 2017-02-25 DIAGNOSIS — E785 Hyperlipidemia, unspecified: Secondary | ICD-10-CM | POA: Diagnosis not present

## 2017-02-25 DIAGNOSIS — M549 Dorsalgia, unspecified: Secondary | ICD-10-CM | POA: Diagnosis not present

## 2017-02-25 DIAGNOSIS — F1010 Alcohol abuse, uncomplicated: Secondary | ICD-10-CM | POA: Diagnosis not present

## 2017-02-25 DIAGNOSIS — K219 Gastro-esophageal reflux disease without esophagitis: Secondary | ICD-10-CM | POA: Diagnosis not present

## 2017-02-25 DIAGNOSIS — R9439 Abnormal result of other cardiovascular function study: Secondary | ICD-10-CM | POA: Diagnosis not present

## 2017-02-25 DIAGNOSIS — I25119 Atherosclerotic heart disease of native coronary artery with unspecified angina pectoris: Secondary | ICD-10-CM | POA: Diagnosis not present

## 2017-02-25 DIAGNOSIS — R69 Illness, unspecified: Secondary | ICD-10-CM | POA: Diagnosis not present

## 2017-02-25 LAB — CBC
HCT: 39.1 % (ref 39.0–52.0)
Hemoglobin: 13.2 g/dL (ref 13.0–17.0)
MCH: 34.4 pg — ABNORMAL HIGH (ref 26.0–34.0)
MCHC: 33.8 g/dL (ref 30.0–36.0)
MCV: 101.8 fL — ABNORMAL HIGH (ref 78.0–100.0)
Platelets: 175 10*3/uL (ref 150–400)
RBC: 3.84 MIL/uL — ABNORMAL LOW (ref 4.22–5.81)
RDW: 13 % (ref 11.5–15.5)
WBC: 6.4 10*3/uL (ref 4.0–10.5)

## 2017-02-25 LAB — URINALYSIS, ROUTINE W REFLEX MICROSCOPIC
BILIRUBIN URINE: NEGATIVE
Glucose, UA: NEGATIVE mg/dL
KETONES UR: NEGATIVE mg/dL
Leukocytes, UA: NEGATIVE
NITRITE: NEGATIVE
PH: 7 (ref 5.0–8.0)
Protein, ur: NEGATIVE mg/dL
Specific Gravity, Urine: 1.01 (ref 1.005–1.030)

## 2017-02-25 LAB — URINALYSIS, MICROSCOPIC (REFLEX)

## 2017-02-25 LAB — BASIC METABOLIC PANEL
Anion gap: 6 (ref 5–15)
BUN: 11 mg/dL (ref 6–20)
CALCIUM: 8.4 mg/dL — AB (ref 8.9–10.3)
CO2: 25 mmol/L (ref 22–32)
Chloride: 104 mmol/L (ref 101–111)
Creatinine, Ser: 0.96 mg/dL (ref 0.61–1.24)
GFR calc Af Amer: >60 mL/min (ref >60)
GLUCOSE: 109 mg/dL — AB (ref 65–99)
Potassium: 3.6 mmol/L (ref 3.5–5.1)
Sodium: 135 mmol/L (ref 135–145)

## 2017-02-25 LAB — MRSA PCR SCREENING: MRSA BY PCR: NEGATIVE

## 2017-02-25 MED ORDER — ASPIRIN 81 MG PO CHEW: 81.0000 mg | CHEWABLE_TABLET | Freq: Every day | ORAL | 0 refills | Status: DC

## 2017-02-25 NOTE — Care Management Note (Signed)
Case Management Note  Patient Details  Name: CAREL CARRIER MRN: [id number] Date of Birth: [birth year]  Subjective/Objective:    Pt admitted with Abnormal stress test                Action/Plan:   PTA completely independent from home.  Pt has PCP and denied barriers to obtaining medications as prescribed   Expected Discharge Date:  02/25/17               Expected Discharge Plan:  Home/Self Care  In-House Referral:     Discharge planning Services  CM Consult  Post Acute Care Choice:    Choice offered to:     DME Arranged:    DME Agency:     HH Arranged:    [place] Agency:     Status of Service:  Completed, signed off  If discussed at [employer] of Stay Meetings, dates discussed:    Additional Comments:  Maryclare Labrador, RN 02/25/2017, 12:41 PM

## 2017-02-25 NOTE — Discharge Instructions (Signed)
Coronary Angiogram With Stent, Care After This sheet gives you information about how to care for yourself after your procedure. Your health care provider may also give you more specific instructions. If you have problems or questions, contact your health care provider. What can I expect after the procedure? After your procedure, it is common to have:  Bruising in the area where a small, thin tube (catheter) was inserted. This usually fades within 1-2 weeks.  Blood collecting in the tissue (hematoma) that may be painful to the touch. It should usually decrease in size and tenderness within 1-2 weeks.  Follow these instructions at home: Insertion area care  Do not take baths, swim, or use a hot tub until your health care provider approves.  You may shower 24-48 hours after the procedure or as directed by your health care provider.  Follow instructions from your health care provider about how to take care of your incision. Make sure you: ? Wash your hands with soap and water before you change your bandage (dressing). If soap and water are not available, use hand sanitizer. ? Change your dressing as told by your health care provider. ? Leave stitches (sutures), skin glue, or adhesive strips in place. These skin closures may need to stay in place for 2 weeks or longer. If adhesive strip edges start to loosen and curl up, you may trim the loose edges. Do not remove adhesive strips completely unless your health care provider tells you to do that.  Remove the bandage (dressing) and gently wash the catheter insertion site with plain soap and water.  Pat the area dry with a clean towel. Do not rub the area, because that may cause bleeding.  Do not apply powder or lotion to the incision area.  Check your incision area every day for signs of infection. Check for: ? More redness, swelling, or pain. ? More fluid or blood. ? Warmth. ? Pus or a bad smell. Activity  Do not drive for 24 hours if  you were given a medicine to help you relax (sedative).  Do not lift anything that is heavier than 10 lb (4.5 kg) for 5 days after your procedure or as directed by your health care provider.  Ask your health care provider when it is okay for you: ? To return to work or school. ? To resume usual physical activities or sports. ? To resume sexual activity. Eating and drinking  Eat a heart-healthy diet. This should include plenty of fresh fruits and vegetables.  Avoid the following types of food: ? Food that is high in salt. ? Canned or highly processed food. ? Food that is high in saturated fat or sugar. ? [employer].  Limit alcohol intake to no more than 1 drink a day for non-pregnant women and 2 drinks a day for men. One drink equals 12 oz of beer, 5 oz of wine, or 1 oz of hard liquor. Lifestyle  Do not use any products that contain nicotine or tobacco, such as cigarettes and e-cigarettes. If you need help quitting, ask your health care provider.  Take steps to manage and control your weight.  Get regular exercise.  Manage your blood pressure.  Manage other health problems, such as diabetes. General instructions  Take over-the-counter and prescription medicines only as told by your health care provider. Blood thinners may be prescribed after your procedure to improve blood flow through the stent.  If you need an MRI after your heart stent has been placed,  be sure to tell the health care provider who orders the MRI that you have a heart stent.  Keep all follow-up visits as directed by your health care provider. This is important. Contact a health care provider if:  You have a fever.  You have chills.  You have increased bleeding from the catheter insertion area. Hold pressure on the area. Get help right away if:  You develop chest pain or shortness of breath.  You feel faint or you pass out.  You have unusual pain at the catheter insertion area.  You have redness,  warmth, or swelling at the catheter insertion area.  You have drainage (other than a small amount of blood on the dressing) from the catheter insertion area.  The catheter insertion area is bleeding, and the bleeding does not stop after 30 minutes of holding steady pressure on the area.  You develop bleeding from any other place, such as from your rectum. There may be bright red blood in your urine or stool, or it may appear as black, tarry stool. This information is not intended to replace advice given to you by your health care provider. Make sure you discuss any questions you have with your health care provider. Document Released: 02/01/2005 Document Revised: 04/11/2016 Document Reviewed: 04/11/2016 Elsevier Interactive Patient Education  2018 [employer]. Please note different address for appointment.  It is for stent follow up then back to Dr. Debara Pickett.

## 2017-02-25 NOTE — Progress Notes (Signed)
Patient given discharge information and collected all personal items. Patient to be wheeled to car for transport home with wife.

## 2017-02-26 ENCOUNTER — Telehealth (HOSPITAL_COMMUNITY): Payer: Self-pay

## 2017-02-26 ENCOUNTER — Encounter: Payer: Self-pay | Admitting: Family Medicine

## 2017-02-26 ENCOUNTER — Ambulatory Visit (INDEPENDENT_AMBULATORY_CARE_PROVIDER_SITE_OTHER): Payer: Medicare HMO | Admitting: Family Medicine

## 2017-02-26 VITALS — BP 102/62 | HR 70 | Temp 98.4°F | Wt 234.3 lb

## 2017-02-26 DIAGNOSIS — Z87448 Personal history of other diseases of urinary system: Secondary | ICD-10-CM | POA: Diagnosis not present

## 2017-02-26 DIAGNOSIS — I2510 Atherosclerotic heart disease of native coronary artery without angina pectoris: Secondary | ICD-10-CM | POA: Diagnosis not present

## 2017-02-26 DIAGNOSIS — E785 Hyperlipidemia, unspecified: Secondary | ICD-10-CM

## 2017-02-26 DIAGNOSIS — K219 Gastro-esophageal reflux disease without esophagitis: Secondary | ICD-10-CM

## 2017-02-26 DIAGNOSIS — Z9861 Coronary angioplasty status: Secondary | ICD-10-CM

## 2017-02-26 LAB — POCT URINALYSIS DIPSTICK
BILIRUBIN UA: NEGATIVE
Blood, UA: NEGATIVE
Glucose, UA: NEGATIVE
KETONES UA: NEGATIVE
LEUKOCYTES UA: NEGATIVE
Nitrite, UA: NEGATIVE
Protein, UA: POSITIVE
Spec Grav, UA: 1.025 (ref 1.010–1.025)
Urobilinogen, UA: 0.2 E.U./dL
pH, UA: 6.5 (ref 5.0–8.0)

## 2017-02-26 MED ORDER — PANTOPRAZOLE SODIUM 40 MG PO TBEC: 40.0000 mg | DELAYED_RELEASE_TABLET | Freq: Every day | ORAL | 5 refills | Status: DC

## 2017-02-26 NOTE — Telephone Encounter (Signed)
Patient insurance is active and benefits verified. Patient insurance is [employer] - $45.00 co-pay, no deductible, out of pocket $4500/$542.38 has been met, no co-insurance and no pre-authorization. Passport/reference [phone].

## 2017-02-26 NOTE — Progress Notes (Signed)
Subjective:     Patient ID: Charles Villa, male   DOB: 08-Nov-1945, 72 y.o.   MRN: [id number]  HPI Patient seen for hospital follow-up in the absence of his primary who is away this week. He had been seen here recently with some progressive dyspnea over several months and possibly couple years according to his wife. She had been urging him to get further evaluation. He denies any history of chest pains. He was referred to cardiology. He had abnormal stress test and was then admitted for further diagnostic studies.  Underwent catheterization with 90% stenosis mid circumflex lesion. Stent was placed with 0% residual stenosis. Left ventricular systolic function normal with ejection fraction 55-65%. Discharged on dual antiplatelet therapy with aspirin and Plavix.  Patient does have history of GERD and had been taking Nexium and occasionally Prilosec and was advised against this because of the Plavix. He wishes to discuss alternatives to those medications. GERD is mostly controlled with over-the-counter Pepcid or Zantac but he used to takes the Nexium 2 or 3 times per week. No dysphagia.  Patient had apparently episode of hematuria post catheterization while in short stay. Urinalysis revealed large amount of blood. No evidence for infection.   His hematuria resolved following day. He has history of psoriasis and thinks he may of had some mild local irritation. In any event, he's had no hematuria or dysuria since discharge. Requesting repeat urinalysis today. No hx of kidney stones.  Patient still has some mild dyspnea with exertion since discharge but his wife states that his "color" is somewhat improved. He may have some significant deconditioning as well. Other problems include history of hypertension, GERD, psoriasis, hyperlipidemia, tremor, alcohol abuse. He apparently has scaled back his alcohol considerably since discharge but has not discontinued completely.  Past Medical History:  Diagnosis Date   . Anal fissure 08/17/2012   Really more gluteal crease irritation- protosol hc 2.5% prn   . BACK PAIN, CHRONIC 02/10/2007  . CAD (coronary artery disease), native coronary artery    02/24/17-PCI/DES to mLcx, normal EF.  . Diverticulosis of colon    2007  . GERD 02/23/2007  . GERD (gastroesophageal reflux disease)   . Hyperlipidemia   . Hypertension   . OCD (obsessive compulsive disorder) 01/23/2016   Ocd/hoarding issues- psychiatristis- [place] started on zoloft 100mg --> paxil  . Personal history of colonic polyps 03/14/2014   08/2005. Benign. 10 year repeat. 2010 colonoscopy in record was entered erroneously.    [name] PSORIASIS 02/10/2007   Topical therapy.     . Scalp psoriasis   . SQUAMOUS CELL CARCINOMA OF SKIN SITE UNSPECIFIED 08/24/2010   Annotation: face    Past Surgical History:  Procedure Laterality Date  . COLONOSCOPY W/ POLYPECTOMY     2007 [name]  . CORONARY STENT INTERVENTION N/A 02/24/2017   Procedure: Coronary Stent Intervention;  Surgeon: Leonie Man, MD;  Location: [place] [facility];  Service: Cardiovascular;  Laterality: N/A;  . LEFT HEART CATH AND CORONARY ANGIOGRAPHY N/A 02/24/2017   Procedure: Left Heart Cath and Coronary Angiography;  Surgeon: Leonie Man, MD;  Location: [place] [facility];  Service: Cardiovascular;  Laterality: N/A;  . SKIN CANCER EXCISION     squamous cell CA - nose  . TONSILLECTOMY      reports that he has never smoked. He has never used smokeless tobacco. He reports that he drinks about 12.6 oz of alcohol per week . He reports that he does not use drugs. family history  includes Hypertension in his mother; Lung cancer in his father; Stroke in his mother. No Known Allergies'   Past Medical History:  Diagnosis Date  . Anal fissure 08/17/2012   Really more gluteal crease irritation- protosol hc 2.5% prn   . BACK PAIN, CHRONIC 02/10/2007  . CAD (coronary artery disease), native coronary artery    02/24/17-PCI/DES to mLcx, normal EF.  .  Diverticulosis of colon    2007  . GERD 02/23/2007  . GERD (gastroesophageal reflux disease)   . Hyperlipidemia   . Hypertension   . OCD (obsessive compulsive disorder) 01/23/2016   Ocd/hoarding issues- psychiatristis- [place] started on zoloft 100mg --> paxil  . Personal history of colonic polyps 03/14/2014   08/2005. Benign. 10 year repeat. 2010 colonoscopy in record was entered erroneously.    [name] PSORIASIS 02/10/2007   Topical therapy.     . Scalp psoriasis   . SQUAMOUS CELL CARCINOMA OF SKIN SITE UNSPECIFIED 08/24/2010   Annotation: face    Past Surgical History:  Procedure Laterality Date  . COLONOSCOPY W/ POLYPECTOMY     2007 [name]  . CORONARY STENT INTERVENTION N/A 02/24/2017   Procedure: Coronary Stent Intervention;  Surgeon: Leonie Man, MD;  Location: [place] [facility];  Service: Cardiovascular;  Laterality: N/A;  . LEFT HEART CATH AND CORONARY ANGIOGRAPHY N/A 02/24/2017   Procedure: Left Heart Cath and Coronary Angiography;  Surgeon: Leonie Man, MD;  Location: [place] [facility];  Service: Cardiovascular;  Laterality: N/A;  . SKIN CANCER EXCISION     squamous cell CA - nose  . TONSILLECTOMY      reports that he has never smoked. He has never used smokeless tobacco. He reports that he drinks about 12.6 oz of alcohol per week . He reports that he does not use drugs. family history includes Hypertension in his mother; Lung cancer in his father; Stroke in his mother. No Known Allergies   Review of Systems  Constitutional: Negative for chills, fatigue and fever.  Eyes: Negative for visual disturbance.  Respiratory: Negative for cough, chest tightness and wheezing.   Cardiovascular: Negative for chest pain, palpitations and leg swelling.  Gastrointestinal: Negative for abdominal pain.  Genitourinary: Negative for dysuria, flank pain and hematuria.  Neurological: Negative for dizziness, syncope, weakness, light-headedness and headaches.       Objective:    Physical Exam  Constitutional: He appears well-developed and well-nourished.  Neck: Neck supple.  Cardiovascular: Normal rate and regular rhythm.   Pulmonary/Chest: Effort normal and breath sounds normal. No respiratory distress. He has no wheezes. He has no rales.  Musculoskeletal: He exhibits no edema.  Lymphadenopathy:    He has no cervical adenopathy.       Assessment:     #1 CAD with recent catheterization revealing 90% mid circumflex lesion which was successfully stented.  #2 dyslipidemia. Previous lipids not at goal-in view of now known CAD. Previous LDL 131  #3 hypertension  #4 history of alcohol abuse  #5 singular episode of reported hematuria during hospitalization. Urine dipstick today is completely normal with exception of protein but no blood and no leukocytes  #6 GERD.      Plan:     -Order fasting lipid panel. Will likely need change of statin to reach goal. Currently on simvastatin 20 mg daily -Discontinue Nexium because of his Plavix therapy. Prescription for Protonix 40 mg once daily -Follow-up immediately for any recurrent gross hematuria or other urinary concerns -He is strongly advised to discontinue alcohol if  difficulties controlling volume -He already has scheduled follow-up with cardiology and is encouraged to follow-up closely with his primary provider as well  Eulas Post MD [facility]

## 2017-02-26 NOTE — Patient Instructions (Signed)
Return for fasting lipids tomorrow morning.

## 2017-02-27 ENCOUNTER — Other Ambulatory Visit (INDEPENDENT_AMBULATORY_CARE_PROVIDER_SITE_OTHER): Payer: Medicare HMO

## 2017-02-27 DIAGNOSIS — I2510 Atherosclerotic heart disease of native coronary artery without angina pectoris: Secondary | ICD-10-CM

## 2017-02-27 DIAGNOSIS — Z9861 Coronary angioplasty status: Secondary | ICD-10-CM

## 2017-02-27 LAB — LIPID PANEL
CHOL/HDL RATIO: 3
Cholesterol: 194 mg/dL (ref 0–200)
HDL: 57.6 mg/dL (ref >39.00)
LDL CALC: 108 mg/dL — AB (ref 0–99)
NONHDL: 136.88
Triglycerides: 142 mg/dL (ref 0.0–149.0)
VLDL: 28.4 mg/dL (ref 0.0–40.0)

## 2017-02-27 LAB — HEPATIC FUNCTION PANEL
ALT: 24 U/L (ref 0–53)
AST: 30 U/L (ref 0–37)
Albumin: 3.7 g/dL (ref 3.5–5.2)
Alkaline Phosphatase: 43 U/L (ref 39–117)
BILIRUBIN DIRECT: 0.1 mg/dL (ref 0.0–0.3)
BILIRUBIN TOTAL: 0.7 mg/dL (ref 0.2–1.2)
Total Protein: 6.8 g/dL (ref 6.0–8.3)

## 2017-02-28 ENCOUNTER — Other Ambulatory Visit: Payer: Self-pay | Admitting: Family Medicine

## 2017-02-28 DIAGNOSIS — I10 Essential (primary) hypertension: Secondary | ICD-10-CM

## 2017-02-28 MED ORDER — ATORVASTATIN CALCIUM 40 MG PO TABS: 40.0000 mg | ORAL_TABLET | Freq: Every day | ORAL | 1 refills | Status: DC

## 2017-03-03 ENCOUNTER — Ambulatory Visit (INDEPENDENT_AMBULATORY_CARE_PROVIDER_SITE_OTHER): Payer: Medicare HMO | Admitting: Nurse Practitioner

## 2017-03-03 ENCOUNTER — Encounter: Payer: Self-pay | Admitting: Nurse Practitioner

## 2017-03-03 VITALS — BP 112/80 | HR 71 | Ht 72.0 in | Wt 227.6 lb

## 2017-03-03 DIAGNOSIS — Z955 Presence of coronary angioplasty implant and graft: Secondary | ICD-10-CM

## 2017-03-03 DIAGNOSIS — R0602 Shortness of breath: Secondary | ICD-10-CM | POA: Diagnosis not present

## 2017-03-03 DIAGNOSIS — E7800 Pure hypercholesterolemia, unspecified: Secondary | ICD-10-CM

## 2017-03-03 DIAGNOSIS — I10 Essential (primary) hypertension: Secondary | ICD-10-CM

## 2017-03-03 LAB — CBC
Hematocrit: 45.1 % (ref 37.5–51.0)
Hemoglobin: 15.2 g/dL (ref 13.0–17.7)
MCH: 34.5 pg — ABNORMAL HIGH (ref 26.6–33.0)
MCHC: 33.7 g/dL (ref 31.5–35.7)
MCV: 102 fL — ABNORMAL HIGH (ref 79–97)
Platelets: 212 10*3/uL (ref 150–379)
RBC: 4.41 x10E6/uL (ref 4.14–5.80)
RDW: 12.6 % (ref 12.3–15.4)
WBC: 5.5 10*3/uL (ref 3.4–10.8)

## 2017-03-03 LAB — BASIC METABOLIC PANEL
BUN/Creatinine Ratio: 19 (ref 10–24)
BUN: 21 mg/dL (ref 8–27)
CO2: 19 mmol/L — ABNORMAL LOW (ref 20–29)
Calcium: 9.2 mg/dL (ref 8.6–10.2)
Chloride: 101 mmol/L (ref 96–106)
Creatinine, Ser: 1.12 mg/dL (ref 0.76–1.27)
GFR calc Af Amer: 77 mL/min/{1.73_m2} (ref >59)
GFR calc non Af Amer: 67 mL/min/{1.73_m2} (ref >59)
Glucose: 124 mg/dL — ABNORMAL HIGH (ref 65–99)
Potassium: 4.5 mmol/L (ref 3.5–5.2)
Sodium: 135 mmol/L (ref 134–144)

## 2017-03-03 NOTE — Patient Instructions (Addendum)
We will be checking the following labs today - BMET and CBC   Medication Instructions:    Continue with your current medicines.     Testing/Procedures To Be Arranged:  N/A  Follow-Up:   See Dr. Debara Pickett as planned later this month    Other Special Instructions:   I will send a message to cardiac rehab.     If you need a refill on your cardiac medications before your next appointment, please call your pharmacy.   Call the [place] office at [phone] if you have any questions, problems or concerns.

## 2017-03-03 NOTE — Progress Notes (Signed)
CARDIOLOGY OFFICE NOTE  Date:  03/03/2017     [name] Date of Birth: 11-14-1945 Medical Record #[id number]  PCP:  Marin Olp, MD  Cardiologist:  [facility]  Chief Complaint  Patient presents with  . Coronary Artery Disease    Post hospital visit - seen for Dr. Debara Pickett    History of Present Illness: Charles Villa is a 71 y.o. male who presents today for a post hospital visit. Seen for Dr. Debara Pickett.   He has a history of hypertension, dyslipidemia, GERD, chronic back pain, OCD and long-standing alcohol use.  Recently seen in the office for the evaluation of shortness of breath at the request of Dr. Garret Reddish.  He underwent exercise treadmill that was abnormal with ST segment depression 23 aVF, v5,v6 at 5 minutes. Echo showed normal systolic function with mild diastolic dysfunction. Given these findings he was referred for cardiac catheterization.  Underwent LHC with Dr. Ellyn Hack on 02/24/17 noted above with PCI/DES x1 to the mLcx. Plan for DAPT with ASA/plavix for at least one year.  While in short stay, patient noted hematuria post cath. UA noted large amount hemoglobin. He was admitted overnight for observation. No hematuria noted the following day. Patient felt this may have been related to lesion on his scrotum. He was continued on medications without any changes. Discharged in stable condition.    Comes in today. Here alone. Feels like he is doing well. Back to most of his activities.  Feels ok on his medicines. No real problems noted. His shortness of breath has improved somewhat. Still a little out of breath at times - feels like it is weight related and he "is working on it. No chest pain whatsoever. He has started walking but wants to swim. Admits he could "do better". Overall, no real concerns and feels like he is doing ok. No problems with his cath site.   Past Medical History:  Diagnosis Date  . Anal fissure 08/17/2012   Really more gluteal crease irritation-  protosol hc 2.5% prn   . BACK PAIN, CHRONIC 02/10/2007  . CAD (coronary artery disease), native coronary artery    02/24/17-PCI/DES to mLcx, normal EF.  . Diverticulosis of colon    2007  . GERD 02/23/2007  . GERD (gastroesophageal reflux disease)   . Hyperlipidemia   . Hypertension   . OCD (obsessive compulsive disorder) 01/23/2016   Ocd/hoarding issues- psychiatristis- Cottle started on zoloft 100mg --> paxil  . Personal history of colonic polyps 03/14/2014   08/2005. Benign. 10 year repeat. 2010 colonoscopy in record was entered erroneously.    [name] PSORIASIS 02/10/2007   Topical therapy.     . Scalp psoriasis   . SQUAMOUS CELL CARCINOMA OF SKIN SITE UNSPECIFIED 08/24/2010   Annotation: face     Past Surgical History:  Procedure Laterality Date  . COLONOSCOPY W/ POLYPECTOMY     2007 [name]  . CORONARY STENT INTERVENTION N/A 02/24/2017   Procedure: Coronary Stent Intervention;  Surgeon: Leonie Man, MD;  Location: [place] [facility];  Service: Cardiovascular;  Laterality: N/A;  . LEFT HEART CATH AND CORONARY ANGIOGRAPHY N/A 02/24/2017   Procedure: Left Heart Cath and Coronary Angiography;  Surgeon: Leonie Man, MD;  Location: [place] [facility];  Service: Cardiovascular;  Laterality: N/A;  . SKIN CANCER EXCISION     squamous cell CA - nose  . TONSILLECTOMY       Medications: Current Meds  Medication Sig  . aspirin  81 MG chewable tablet Chew 1 tablet (81 mg total) by mouth daily.  [name] atorvastatin (LIPITOR) 40 MG tablet Take 1 tablet (40 mg total) by mouth daily.  . bisoprolol-hydrochlorothiazide (ZIAC) 2.5-6.25 MG tablet Take 1 tablet by mouth daily.  . clopidogrel (PLAVIX) 75 MG tablet Take 1 tablet (75 mg total) by mouth daily with breakfast.  . clotrimazole-betamethasone (LOTRISONE) cream Apply 1 application topically 2 (two) times daily as needed. psoriass  . [employer] OMEGA-3 KRILL OIL 300 MG CAPS Take 1 tablet by mouth every other day.  [name] desonide (DESONATE) 0.05 % gel  Apply 1 application topically 2 (two) times daily as needed. Skin lesions  . fluocinolone (VANOS) 0.01 % cream Apply 1 application topically daily as needed.   . hydrocortisone (ANUSOL-HC) 2.5 % rectal cream Place 1 application rectally 2 (two) times daily as needed for hemorrhoids or anal itching.  . nitroGLYCERIN (NITROSTAT) 0.4 MG SL tablet Place 1 tablet (0.4 mg total) under the tongue every 5 (five) minutes as needed.  . pantoprazole (PROTONIX) 40 MG tablet Take 1 tablet (40 mg total) by mouth daily.  [name] PARoxetine (PAXIL) 30 MG tablet Take 60 mg by mouth daily.   . Probiotic Product (PROBIOTIC DAILY PO) Take by mouth every other day.   . thiamine (VITAMIN B-1) 100 MG tablet Take 100 mg by mouth daily.     Allergies: No Known Allergies  Social History: The patient  reports that he has never smoked. He has never used smokeless tobacco. He reports that he drinks about 12.6 oz of alcohol per week . He reports that he does not use drugs.   Family History: The patient's family history includes Hypertension in his mother; Lung cancer in his father; Stroke in his mother.   Review of Systems: Please see the history of present illness.   Otherwise, the review of systems is positive for .   All other systems are reviewed and negative.   Physical Exam: VS:  BP 112/80 (BP Location: Left Arm, Patient Position: Sitting, Cuff Size: Normal)   Pulse 71   Ht 6' (1.829 m)   Wt 227 lb 9.6 oz (103.2 kg)   SpO2 92% Comment: at rest  BMI 30.87 kg/m  .  BMI Body mass index is 30.87 kg/m.  Wt Readings from Last 3 Encounters:  03/03/17 227 lb 9.6 oz (103.2 kg)  02/26/17 234 lb 4.8 oz (106.3 kg)  02/24/17 231 lb (104.8 kg)    General: Pleasant. Alert and in no acute distress.   HEENT: Normal.  Neck: Supple, no JVD, carotid bruits, or masses noted.  Cardiac: Regular rate and rhythm. No murmurs, rubs, or gallops. No edema.  Respiratory:  Lungs are clear to auscultation bilaterally with normal work  of breathing.  GI: Soft and nontender.  MS: No deformity or atrophy. Gait and ROM intact.  Skin: Warm and dry. Color is normal.  Neuro:  Strength and sensation are intact and no gross focal deficits noted.  Psych: Alert, appropriate and with normal affect.   LABORATORY DATA:  EKG:  EKG is not ordered today.  Lab Results  Component Value Date   WBC 6.4 02/25/2017   HGB 13.2 02/25/2017   HCT 39.1 02/25/2017   PLT 175 02/25/2017   GLUCOSE 109 (H) 02/25/2017   CHOL 194 02/27/2017   TRIG 142.0 02/27/2017   HDL 57.60 02/27/2017   LDLDIRECT 145.3 07/12/2013   LDLCALC 108 (H) 02/27/2017   ALT 24 02/27/2017   AST 30 02/27/2017  NA 135 02/25/2017   K 3.6 02/25/2017   CL 104 02/25/2017   CREATININE 0.96 02/25/2017   BUN 11 02/25/2017   CO2 25 02/25/2017   TSH 1.300 02/20/2017   PSA 0.40 08/04/2015   INR 1.0 02/20/2017     BNP (last 3 results) No results for input(s): BNP in the last 8760 hours.  ProBNP (last 3 results)  Recent Labs  12/30/16 1049  PROBNP 96.0     Other Studies Reviewed Today:  LHC: 02/24/17 Conclusion     Mid Cx lesion, 90 %stenosed.  A STENT PROMUS PREM MR 3.5X16 drug eluting stent was successfully placed.  Post intervention, there is a 0% residual stenosis.  _______________  Moderate disease in the ostial and mid LAD with proximal-mid ectasia  The left ventricular systolic function is normal. The left ventricular ejection fraction is 55-65% by visual estimate.  LV end diastolic pressure is normal.  Severe focal single vessel disease involving the mid circumflex just prior to large lateral OM branch. Successful PCI DES.  Plan:  Same-day discharged today on dual antiplatelet therapy for minimum one year.  Patient will likely need titration of his anti-hypertensive agents as well as cardiac risk factor modification     Assessment/Plan:  1. NSTEMI with DES to the mid LCX - on DAPT - doing well clinically. He is interested in  cardiac rehab. Needs CV risk factor modification.   2. Residual CAD - to manage medically - needs CV risk factor modification - discussed at length.   3. HTN - BP is ok. No changes made in current regimen.   4. HLD - on statin therapy  5. Substance abuse with alcohol - not discussed today  Current medicines are reviewed with the patient today.  The patient does not have concerns regarding medicines other than what has been noted above.  The following changes have been made:  See above.  Labs/ tests ordered today include:    Orders Placed This Encounter  Procedures  . Basic metabolic panel  . CBC     Disposition:   FU with Dr. Debara Pickett as planned later this month.   Patient is agreeable to this plan and will call if any problems develop in the interim.   SignedTruitt Merle, NP  03/03/2017 12:22 PM  [facility] [address] [place], [place]  [address] Phone: [phone] Fax: [phone]

## 2017-03-14 ENCOUNTER — Telehealth (HOSPITAL_COMMUNITY): Payer: Self-pay

## 2017-03-14 NOTE — Telephone Encounter (Signed)
Cardiac Rehab Medication Review by a Pharmacist  Does the patient  feel that his/her medications are working for him/her?  yes  Has the patient been experiencing any side effects to the medications prescribed?  no  Does the patient measure his/her own blood pressure or blood glucose at home?  yes - normally everyday. (~120-130/95 mmHg)  Does the patient have any problems obtaining medications due to transportation or finances?   no   Understanding of regimen: good Understanding of indications: good Potential of compliance: good    Pharmacist comments: Patient is doing well on medications and states understanding of regimen.   Diana L. Kyung Rudd, PharmD, [place] PGY1 Pharmacy Resident Pager: [phone]

## 2017-03-17 ENCOUNTER — Ambulatory Visit (INDEPENDENT_AMBULATORY_CARE_PROVIDER_SITE_OTHER): Payer: Medicare HMO | Admitting: Internal Medicine

## 2017-03-17 ENCOUNTER — Telehealth (HOSPITAL_COMMUNITY): Payer: Self-pay

## 2017-03-17 ENCOUNTER — Encounter: Payer: Self-pay | Admitting: Internal Medicine

## 2017-03-17 VITALS — BP 112/70 | HR 61 | Ht 72.0 in | Wt 224.0 lb

## 2017-03-17 DIAGNOSIS — R0602 Shortness of breath: Secondary | ICD-10-CM

## 2017-03-17 DIAGNOSIS — Z9861 Coronary angioplasty status: Secondary | ICD-10-CM

## 2017-03-17 DIAGNOSIS — I2510 Atherosclerotic heart disease of native coronary artery without angina pectoris: Secondary | ICD-10-CM

## 2017-03-17 DIAGNOSIS — E7800 Pure hypercholesterolemia, unspecified: Secondary | ICD-10-CM

## 2017-03-17 DIAGNOSIS — I10 Essential (primary) hypertension: Secondary | ICD-10-CM

## 2017-03-17 MED ORDER — PANTOPRAZOLE SODIUM 40 MG PO TBEC: 40.0000 mg | DELAYED_RELEASE_TABLET | Freq: Every day | ORAL | 11 refills | Status: DC

## 2017-03-17 NOTE — Progress Notes (Signed)
OFFICE CONSULT NOTE  Chief Complaint:  Follow-up stress cath  Primary Care Physician: Marin Olp, MD  HPI:  Charles Villa is a 71 y.o. male who is being seen today for the evaluation of shortness of breath at the request of Marin Olp, MD. Charles Villa has a history of hypertension, dyslipidemia, GERD, chronic back pain, OCD and long-standing alcohol use, according to his wife who did most the talking during the visit he drinks about 2-3 drinks per day however could be more. She says that's increased more recently. She notes that over the past 2 years she's had progressively worsening shortness of breath. 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 They have a vacation house at [facility] and he has approximate 65 steps to climb up. He says he gets short of breath walking up the stairs and has been more difficult to do that without stopping. He seems to minimize his symptoms. His wife is concerned about possible coronary artery disease as a cause of this. She used to work in cardiac rehabilitation, possibly is an Physiological scientist. There is a family history of hypertension and stroke in his mother and his father who was a smoker died of lung cancer. He denies any chest pain or pressure, palpitations, presyncope or syncopal symptoms. He also denies any fever, chills, cough or other infective symptoms.  02/20/2017  Charles Villa returns today for follow-up. He underwent echocardiogram as well as an exercise treadmill stress test. The exercise treadmill stress test unfortunately was abnormal indicating ST segment depression in leads 23 aVF and V5 and V6 beginning at 5 minutes a stress returning to baseline after about 5-9 minutes of recovery. His echocardiogram demonstrated normal systolic function, mild diastolic dysfunction and some mild valvular abnormalities. I reviewed the results for him today along with his wife and my recommendations for  left heart catheterization.  03/17/2017  Charles Villa was seen today in follow-up. He underwent a graded exercise tolerance test which was abnormal indicating ST segment changes concerning for ischemia. He was subsequently referred for cardiac catheterization. That was performed on 02/24/2017 by Dr. Ellyn Hack which indicated a 90% mid circumflex lesion. This was stented with a Promus Premier 3.5 x 16 mm drug-eluting stent. It was noted that there was moderate disease in the ostial and mid LAD.[name] LVEF was 55-65% with normal LV and diastolic pressure. After the procedure he reports an improvement in his breathing however he does not feel that he is back to "baseline". I explained him that there may be certainly other factors causing him to be short of breath including his significant weight gain and abdominal obesity. We also discussed the fact that he is on dual antiplatelet therapy and that continued alcohol use is not advisable. Fortunately he is agreed to cardiac rehabilitation. I feel that will help with both weight management and cardiac vascular conditioning.  PMHx:  Past Medical History:  Diagnosis Date  . Anal fissure 08/17/2012   Really more gluteal crease irritation- protosol hc 2.5% prn   . BACK PAIN, CHRONIC 02/10/2007  . CAD (coronary artery disease), native coronary artery    02/24/17-PCI/DES to mLcx, normal EF.  . Diverticulosis of colon    2007  . GERD 02/23/2007  . GERD (gastroesophageal reflux disease)   . Hyperlipidemia   . Hypertension   . OCD (obsessive compulsive disorder) 01/23/2016   Ocd/hoarding issues- psychiatristis- [place] started on zoloft 100mg --> paxil  . Personal history of colonic polyps 03/14/2014  08/2005. Benign. 10 year repeat. 2010 colonoscopy in record was entered erroneously.    [name] PSORIASIS 02/10/2007   Topical therapy.     . Scalp psoriasis   . SQUAMOUS CELL CARCINOMA OF SKIN SITE UNSPECIFIED 08/24/2010   Annotation: face     Past Surgical History:    Procedure Laterality Date  . COLONOSCOPY W/ POLYPECTOMY     2007 [name]  . CORONARY STENT INTERVENTION N/A 02/24/2017   Procedure: Coronary Stent Intervention;  Surgeon: Leonie Man, MD;  Location: [place] [facility];  Service: Cardiovascular;  Laterality: N/A;  . LEFT HEART CATH AND CORONARY ANGIOGRAPHY N/A 02/24/2017   Procedure: Left Heart Cath and Coronary Angiography;  Surgeon: Leonie Man, MD;  Location: [place] [facility];  Service: Cardiovascular;  Laterality: N/A;  . SKIN CANCER EXCISION     squamous cell CA - nose  . TONSILLECTOMY      FAMHx:  Family History  Problem Relation Age of Onset  . Lung cancer Father   . Stroke Mother   . Hypertension Mother     SOCHx:   reports that he has never smoked. He has never used smokeless tobacco. He reports that he drinks about 12.6 oz of alcohol per week . He reports that he does not use drugs.  ALLERGIES:  No Known Allergies  ROS: Pertinent items noted in HPI and remainder of comprehensive ROS otherwise negative.  HOME MEDS: Current Outpatient Prescriptions on File Prior to Visit  Medication Sig Dispense Refill  . aspirin 81 MG chewable tablet Chew 1 tablet (81 mg total) by mouth daily. 30 tablet 0  . atorvastatin (LIPITOR) 40 MG tablet Take 1 tablet (40 mg total) by mouth daily. 90 tablet 1  . bisoprolol-hydrochlorothiazide (ZIAC) 2.5-6.25 MG tablet Take 1 tablet by mouth daily. 90 tablet 3  . clopidogrel (PLAVIX) 75 MG tablet Take 1 tablet (75 mg total) by mouth daily with breakfast. 90 tablet 2  . clotrimazole-betamethasone (LOTRISONE) cream Apply 1 application topically 2 (two) times daily as needed. psoriass    . [employer] OMEGA-3 KRILL OIL 300 MG CAPS Take 1 tablet by mouth every other day.    [name] desonide (DESONATE) 0.05 % gel Apply 1 application topically 2 (two) times daily as needed. Skin lesions    . fluocinolone (VANOS) 0.01 % cream Apply 1 application topically daily as needed.     . hydrocortisone (ANUSOL-HC)  2.5 % rectal cream Place 1 application rectally 2 (two) times daily as needed for hemorrhoids or anal itching.    . nitroGLYCERIN (NITROSTAT) 0.4 MG SL tablet Place 1 tablet (0.4 mg total) under the tongue every 5 (five) minutes as needed. 25 tablet 3  . PARoxetine (PAXIL) 30 MG tablet Take 60 mg by mouth daily.   1  . Probiotic Product (PROBIOTIC DAILY PO) Take by mouth every other day.     . thiamine (VITAMIN B-1) 100 MG tablet Take 100 mg by mouth daily.     No current facility-administered medications on file prior to visit.     LABS/IMAGING: No results found for this or any previous visit (from the past 48 hour(s)). No results found.  LIPID PANEL:    Component Value Date/Time   CHOL 194 02/27/2017 0930   TRIG 142.0 02/27/2017 0930   TRIG 89 08/11/2006 0818   HDL 57.60 02/27/2017 0930   CHOLHDL 3 02/27/2017 0930   VLDL 28.4 02/27/2017 0930   LDLCALC 108 (H) 02/27/2017 0930   LDLDIRECT 145.3 07/12/2013  1046    WEIGHTS: Wt Readings from Last 3 Encounters:  03/17/17 224 lb (101.6 kg)  03/03/17 227 lb 9.6 oz (103.2 kg)  02/26/17 234 lb 4.8 oz (106.3 kg)    VITALS: BP 112/70   Pulse 61   Ht 6' (1.829 m)   Wt 224 lb (101.6 kg)   BMI 30.38 kg/m   EXAM: General appearance: alert and no distress Neck: no carotid bruit, no JVD and thyroid not enlarged, symmetric, no tenderness/mass/nodules Lungs: clear to auscultation bilaterally Heart: regular rate and rhythm, S1, S2 normal, no murmur, click, rub or gallop Abdomen: soft, non-tender; bowel sounds normal; no masses,  no organomegaly and obese Extremities: extremities normal, atraumatic, no cyanosis or edema Pulses: 2+ and symmetric Skin: Skin color, texture, turgor normal. No rashes or lesions Neurologic: Grossly normal Psych: Pleasant  EKG: Normal sinus rhythm at 61  ASSESSMENT: 1. Progressive dyspnea on exertion - abnormal treadmill stress test, normal LVEF (01/2017) on echo 2. Coronary artery disease status post  PCI to the mid circumflex-Promus Premier 3.5 by 62mm DES (01/2017) 3. Alcohol abuse 4. Hypertension 5. Dyslipidemia 6. Obesity/Metabolic syndrome  PLAN: 1.   Charles Villa notes an improvement in his dyspnea but his wife says that he's not had a dramatic improvement. I think there is other factors including ongoing alcohol use, obesity, particularly abdominal obesity which may be contributing to his shortness of breath. Recent lipid profile shows his LDL-C is not yet at goal though he is now on high intensity statin. He need additional therapy such as the addition of ezetimibe. We'll reevaluate this in 6 months. I've encouraged him to continue to work on alcohol abstinence especially in the setting of dual antiplatelet therapy. Hopefully his exercise and cardiac rehabilitation will help with weight loss and cardiovascular conditioning.  Follow-up with me in 6 months.  Pixie Casino, MD, [place]  Attending Cardiologist  Direct Dial: [phone]  Fax: [phone]  Website:  [email or web address] 03/17/2017, 1:01 PM

## 2017-03-17 NOTE — Patient Instructions (Signed)
Your physician wants you to follow-up in: 6 months with Dr. [name]. You will receive a reminder letter in the mail two months in advance. If you don't receive a letter, please call our office to schedule the follow-up appointment.    

## 2017-03-17 NOTE — Telephone Encounter (Signed)
*  Updated insurance benefits* [employer] - $45.00 co-payment, no deductible. Out of pocket $4500/$860.88, no co-insurance and no pre-authorization. Passport/reference [phone].

## 2017-03-18 ENCOUNTER — Encounter (HOSPITAL_COMMUNITY): Payer: Self-pay

## 2017-03-18 ENCOUNTER — Encounter (HOSPITAL_COMMUNITY)
Admission: RE | Admit: 2017-03-18 | Discharge: 2017-03-18 | Disposition: A | Discharge status: Home / Self Care | Payer: Medicare HMO | Source: Ambulatory Visit | Attending: Internal Medicine | Admitting: Internal Medicine

## 2017-03-18 VITALS — BP 104/62 | HR 88 | Ht 71.0 in | Wt 226.0 lb

## 2017-03-18 DIAGNOSIS — Z48812 Encounter for surgical aftercare following surgery on the circulatory system: Secondary | ICD-10-CM | POA: Diagnosis not present

## 2017-03-18 DIAGNOSIS — Z955 Presence of coronary angioplasty implant and graft: Secondary | ICD-10-CM

## 2017-03-18 NOTE — Progress Notes (Signed)
Cardiac Individual Treatment Plan  Patient Details  Name: Charles Villa MRN: [id number] Date of Birth: 1945/12/04 Referring Provider:     CARDIAC REHAB PHASE II ORIENTATION from 03/18/2017 in [place]  Referring Provider  Lyman Bishop, MD.      Initial Encounter Date:    CARDIAC REHAB PHASE II ORIENTATION from 03/18/2017 in [place]  Date  03/18/17  Referring Provider  Lyman Bishop, MD.      Visit Diagnosis: Stented coronary artery  Patient's Home Medications on Admission:  Current Outpatient Prescriptions:  .  aspirin 81 MG chewable tablet, Chew 1 tablet (81 mg total) by mouth daily., Disp: 30 tablet, Rfl: 0 .  atorvastatin (LIPITOR) 40 MG tablet, Take 1 tablet (40 mg total) by mouth daily., Disp: 90 tablet, Rfl: 1 .  bisoprolol-hydrochlorothiazide (ZIAC) 2.5-6.25 MG tablet, Take 1 tablet by mouth daily., Disp: 90 tablet, Rfl: 3 .  clopidogrel (PLAVIX) 75 MG tablet, Take 1 tablet (75 mg total) by mouth daily with breakfast., Disp: 90 tablet, Rfl: 2 .  clotrimazole-betamethasone (LOTRISONE) cream, Apply 1 application topically 2 (two) times daily as needed. psoriass, Disp: , Rfl:  .  [employer] OMEGA-3 KRILL OIL 300 MG CAPS, Take 1 tablet by mouth every other day., Disp: , Rfl:  .  desonide (DESONATE) 0.05 % gel, Apply 1 application topically 2 (two) times daily as needed. Skin lesions, Disp: , Rfl:  .  fluocinolone (VANOS) 0.01 % cream, Apply 1 application topically daily as needed. , Disp: , Rfl:  .  hydrocortisone (ANUSOL-HC) 2.5 % rectal cream, Place 1 application rectally 2 (two) times daily as needed for hemorrhoids or anal itching., Disp: , Rfl:  .  nitroGLYCERIN (NITROSTAT) 0.4 MG SL tablet, Place 1 tablet (0.4 mg total) under the tongue every 5 (five) minutes as needed., Disp: 25 tablet, Rfl: 3 .  pantoprazole (PROTONIX) 40 MG tablet, Take 1 tablet (40 mg total) by mouth daily., Disp: 30 tablet, Rfl: 11 .   PARoxetine (PAXIL) 30 MG tablet, Take 60 mg by mouth daily. , Disp: , Rfl: 1 .  Probiotic Product (PROBIOTIC DAILY PO), Take by mouth every other day. , Disp: , Rfl:  .  thiamine (VITAMIN B-1) 100 MG tablet, Take 100 mg by mouth daily., Disp: , Rfl:   Past Medical History: Past Medical History:  Diagnosis Date  . Anal fissure 08/17/2012   Really more gluteal crease irritation- protosol hc 2.5% prn   . BACK PAIN, CHRONIC 02/10/2007  . CAD (coronary artery disease), native coronary artery    02/24/17-PCI/DES to mLcx, normal EF.  . Diverticulosis of colon    2007  . GERD 02/23/2007  . GERD (gastroesophageal reflux disease)   . Hyperlipidemia   . Hypertension   . OCD (obsessive compulsive disorder) 01/23/2016   Ocd/hoarding issues- psychiatristis- [place] started on zoloft 100mg --> paxil  . Personal history of colonic polyps 03/14/2014   08/2005. Benign. 10 year repeat. 2010 colonoscopy in record was entered erroneously.    [name] PSORIASIS 02/10/2007   Topical therapy.     . Scalp psoriasis   . SQUAMOUS CELL CARCINOMA OF SKIN SITE UNSPECIFIED 08/24/2010   Annotation: face     Tobacco Use: History  Smoking Status  . Never Smoker  Smokeless Tobacco  . Never Used    Labs: Recent Review Flowsheet Data    Labs for ITP Cardiac and Pulmonary Rehab Latest Ref Rng & Units 01/04/2013 07/12/2013 03/07/2014 08/04/2015 02/27/2017  Cholestrol 0 - 200 mg/dL 176 208(H) 190 216(H) 194   LDLCALC 0 - 99 mg/dL 111(H) - 117(H) 131(H) 108(H)   LDLDIRECT mg/dL - 145.3 - - -   HDL >39.00 mg/dL 48.50 59.80 58.90 57.70 57.60   Trlycerides 0.0 - 149.0 mg/dL 82.0 68.0 71.0 136.0 142.0      Capillary Blood Glucose: No results found for: GLUCAP   Exercise Target Goals: Date: 03/18/17  Exercise Program Goal: Individual exercise prescription set with THRR, safety & activity barriers. Participant demonstrates ability to understand and report RPE using BORG scale, to self-measure pulse accurately, and to acknowledge  the importance of the exercise prescription.  Exercise Prescription Goal: Starting with aerobic activity 30 plus minutes a day, 3 days per week for initial exercise prescription. Provide home exercise prescription and guidelines that participant acknowledges understanding prior to discharge.  Activity Barriers & Risk Stratification:     Activity Barriers & Cardiac Risk Stratification - 03/18/17 1442      Activity Barriers & Cardiac Risk Stratification   Activity Barriers History of Falls;Other (comment)   Comments Neuropathy- bilateral feet      6 Minute Walk:     6 Minute Walk    Row Name 03/18/17 1441         6 Minute Walk   Phase Initial     Distance 1484 feet     Walk Time 6 minutes     # of Rest Breaks 0     MPH 2.81     METS 2.82     RPE 12     VO2 Peak 9.85     Symptoms No     Resting HR 88 bpm     Resting BP 104/62     Max Ex. HR 90 bpm     Max Ex. BP 106/64     2 Minute Post BP 100/62        Oxygen Initial Assessment:   Oxygen Re-Evaluation:   Oxygen Discharge (Final Oxygen Re-Evaluation):   Initial Exercise Prescription:     Initial Exercise Prescription - 03/18/17 1400      Date of Initial Exercise RX and Referring Provider   Date 03/18/17   Referring Provider Lyman Bishop, MD.     Bike   Level 1   Minutes 10   METs 2.84     NuStep   Level 3   SPM 85   Minutes 10   METs 2.8     Track   Laps 11   Minutes 10   METs 2.92     Prescription Details   Frequency (times per week) 3   Duration Progress to 30 minutes of continuous aerobic without signs/symptoms of physical distress     Intensity   THRR 40-80% of Max Heartrate 60-121   Ratings of Perceived Exertion 11-13   Perceived Dyspnea 0-4     Progression   Progression Continue to progress workloads to maintain intensity without signs/symptoms of physical distress.     Resistance Training   Training Prescription Yes   Weight 4lb   Reps 10-15      Perform Capillary  Blood Glucose checks as needed.  Exercise Prescription Changes:   Exercise Comments:   Exercise Goals and Review:     Exercise Goals    Row Name 03/18/17 1444             Exercise Goals   Increase Physical Activity Yes       Intervention Provide advice, education,  support and counseling about physical activity/exercise needs.;Develop an individualized exercise prescription for aerobic and resistive training based on initial evaluation findings, risk stratification, comorbidities and participant's personal goals.       Expected Outcomes Achievement of increased cardiorespiratory fitness and enhanced flexibility, muscular endurance and strength shown through measurements of functional capacity and personal statement of participant.       Increase Strength and Stamina Yes       Intervention Provide advice, education, support and counseling about physical activity/exercise needs.;Develop an individualized exercise prescription for aerobic and resistive training based on initial evaluation findings, risk stratification, comorbidities and participant's personal goals.       Expected Outcomes Achievement of increased cardiorespiratory fitness and enhanced flexibility, muscular endurance and strength shown through measurements of functional capacity and personal statement of participant.          Exercise Goals Re-Evaluation :    Discharge Exercise Prescription (Final Exercise Prescription Changes):   Nutrition:  Target Goals: Understanding of nutrition guidelines, daily intake of sodium 1500mg , cholesterol 200mg , calories 30% from fat and 7% or less from saturated fats, daily to have 5 or more servings of fruits and vegetables.  Biometrics:     Pre Biometrics - 03/18/17 1030      Pre Biometrics   Height 5\' 11"  (1.803 m)   Weight 225 lb 15.5 oz (102.5 kg)   Waist Circumference 42 inches   Hip Circumference 44.5 inches   Waist to Hip Ratio 0.94 %   BMI (Calculated) 31.53    Triceps Skinfold 21 mm   % Body Fat 31 %   Grip Strength 46.5 kg   Flexibility 0 in   Single Leg Stand 10.78 seconds       Nutrition Therapy Plan and Nutrition Goals:   Nutrition Discharge: Nutrition Scores:   Nutrition Goals Re-Evaluation:   Nutrition Goals Re-Evaluation:   Nutrition Goals Discharge (Final Nutrition Goals Re-Evaluation):   Psychosocial: Target Goals: Acknowledge presence or absence of significant depression and/or stress, maximize coping skills, provide positive support system. Participant is able to verbalize types and ability to use techniques and skills needed for reducing stress and depression.  Initial Review & Psychosocial Screening:     Initial Psych Review & Screening - 03/18/17 1117      Initial Review   Current issues with None Identified     Family Dynamics   Good Support System? Yes  spouse    Comments upon brief assessment, no psychosocial needs identified, no interventions necessary      Barriers   Psychosocial barriers to participate in program There are no identifiable barriers or psychosocial needs.     Screening Interventions   Interventions Encouraged to exercise      Quality of Life Scores:     Quality of Life - 03/18/17 1110      Quality of Life Scores   Health/Function Pre 22.7 %   Socioeconomic Pre 22.06 %   Psych/Spiritual Pre 20.79 %   Family Pre 23.7 %   GLOBAL Pre 22.3 %      PHQ-9: Recent Review Flowsheet Data    Depression screen [facility] 2/9 02/03/2017 10/07/2016 08/07/2015   Decreased Interest 0 0 0   Down, Depressed, Hopeless 0 0 0   PHQ - 2 Score 0 0 0     Interpretation of Total Score  Total Score Depression Severity:  1-4 = Minimal depression, 5-9 = Mild depression, 10-14 = Moderate depression, 15-19 = Moderately severe depression, 20-27 = Severe depression  Psychosocial Evaluation and Intervention:   Psychosocial Re-Evaluation:   Psychosocial Discharge (Final Psychosocial  Re-Evaluation):   Vocational Rehabilitation: Provide vocational rehab assistance to qualifying candidates.   Vocational Rehab Evaluation & Intervention:     Vocational Rehab - 03/18/17 1110      Initial Vocational Rehab Evaluation & Intervention   Assessment shows need for Vocational Rehabilitation No      Education: Education Goals: Education classes will be provided on a weekly basis, covering required topics. Participant will state understanding/return demonstration of topics presented.  Learning Barriers/Preferences:     Learning Barriers/Preferences - 03/18/17 1445      Learning Barriers/Preferences   Learning Barriers None   Learning Preferences Written Material;Video      Education Topics: Count Your Pulse:  -Group instruction provided by verbal instruction, demonstration, patient participation and written materials to support subject.  Instructors address importance of being able to find your pulse and how to count your pulse when at home without a heart monitor.  Patients get hands on experience counting their pulse with staff help and individually.   Heart Attack, Angina, and Risk Factor Modification:  -Group instruction provided by verbal instruction, video, and written materials to support subject.  Instructors address signs and symptoms of angina and heart attacks.    Also discuss risk factors for heart disease and how to make changes to improve heart health risk factors.   Functional Fitness:  -Group instruction provided by verbal instruction, demonstration, patient participation, and written materials to support subject.  Instructors address safety measures for doing things around the house.  Discuss how to get up and down off the floor, how to pick things up properly, how to safely get out of a chair without assistance, and balance training.   Meditation and Mindfulness:  -Group instruction provided by verbal instruction, patient participation, and written  materials to support subject.  Instructor addresses importance of mindfulness and meditation practice to help reduce stress and improve awareness.  Instructor also leads participants through a meditation exercise.    Stretching for Flexibility and Mobility:  -Group instruction provided by verbal instruction, patient participation, and written materials to support subject.  Instructors lead participants through series of stretches that are designed to increase flexibility thus improving mobility.  These stretches are additional exercise for major muscle groups that are typically performed during regular warm up and cool down.   Hands Only CPR:  -Group verbal, video, and participation provides a basic overview of AHA guidelines for community CPR. Role-play of emergencies allow participants the opportunity to practice calling for help and chest compression technique with discussion of AED use.   Hypertension: -Group verbal and written instruction that provides a basic overview of hypertension including the most recent diagnostic guidelines, risk factor reduction with self-care instructions and medication management.    Nutrition I class: Heart Healthy Eating:  -Group instruction provided by PowerPoint slides, verbal discussion, and written materials to support subject matter. The instructor gives an explanation and review of the Therapeutic Lifestyle Changes diet recommendations, which includes a discussion on lipid goals, dietary fat, sodium, fiber, plant stanol/sterol esters, sugar, and the components of a well-balanced, healthy diet.   Nutrition II class: Lifestyle Skills:  -Group instruction provided by PowerPoint slides, verbal discussion, and written materials to support subject matter. The instructor gives an explanation and review of label reading, grocery shopping for heart health, heart healthy recipe modifications, and ways to make healthier choices when eating out.   Diabetes  Question & Answer:  -  Group instruction provided by PowerPoint slides, verbal discussion, and written materials to support subject matter. The instructor gives an explanation and review of diabetes co-morbidities, pre- and post-prandial blood glucose goals, pre-exercise blood glucose goals, signs, symptoms, and treatment of hypoglycemia and hyperglycemia, and foot care basics.   Diabetes Blitz:  -Group instruction provided by PowerPoint slides, verbal discussion, and written materials to support subject matter. The instructor gives an explanation and review of the physiology behind type 1 and type 2 diabetes, diabetes medications and rational behind using different medications, pre- and post-prandial blood glucose recommendations and Hemoglobin A1c goals, diabetes diet, and exercise including blood glucose guidelines for exercising safely.    Portion Distortion:  -Group instruction provided by PowerPoint slides, verbal discussion, written materials, and food models to support subject matter. The instructor gives an explanation of serving size versus portion size, changes in portions sizes over the last 20 years, and what consists of a serving from each food group.   Stress Management:  -Group instruction provided by verbal instruction, video, and written materials to support subject matter.  Instructors review role of stress in heart disease and how to cope with stress positively.     Exercising on Your Own:  -Group instruction provided by verbal instruction, power point, and written materials to support subject.  Instructors discuss benefits of exercise, components of exercise, frequency and intensity of exercise, and end points for exercise.  Also discuss use of nitroglycerin and activating EMS.  Review options of places to exercise outside of rehab.  Review guidelines for sex with heart disease.   Cardiac Drugs I:  -Group instruction provided by verbal instruction and written materials to  support subject.  Instructor reviews cardiac drug classes: antiplatelets, anticoagulants, beta blockers, and statins.  Instructor discusses reasons, side effects, and lifestyle considerations for each drug class.   Cardiac Drugs II:  -Group instruction provided by verbal instruction and written materials to support subject.  Instructor reviews cardiac drug classes: angiotensin converting enzyme inhibitors (ACE-I), angiotensin II receptor blockers (ARBs), nitrates, and calcium channel blockers.  Instructor discusses reasons, side effects, and lifestyle considerations for each drug class.   Anatomy and Physiology of the Circulatory System:  Group verbal and written instruction and models provide basic cardiac anatomy and physiology, with the coronary electrical and arterial systems. Review of: AMI, Angina, Valve disease, Heart Failure, Peripheral Artery Disease, Cardiac Arrhythmia, Pacemakers, and the ICD.   Other Education:  -Group or individual verbal, written, or video instructions that support the educational goals of the cardiac rehab program.   Knowledge Questionnaire Score:     Knowledge Questionnaire Score - 03/18/17 1110      Knowledge Questionnaire Score   Pre Score 23/24      Core Components/Risk Factors/Patient Goals at Admission:     Personal Goals and Risk Factors at Admission - 03/18/17 1354      Core Components/Risk Factors/Patient Goals on Admission    Weight Management Obesity;Yes   Intervention Weight Management/Obesity: Establish reasonable short term and long term weight goals.;Obesity: Provide education and appropriate resources to help participant work on and attain dietary goals.   Admit Weight 225 lb 15.5 oz (102.5 kg)   Expected Outcomes Short Term: Continue to assess and modify interventions until short term weight is achieved;Long Term: Adherence to nutrition and physical activity/exercise program aimed toward attainment of established weight goal;Weight  Loss: Understanding of general recommendations for a balanced deficit meal plan, which promotes 1-2 lb weight loss per week and includes a  negative energy balance of [phone] kcal/d;Understanding recommendations for meals to include 15-35% energy as protein, 25-35% energy from fat, 35-60% energy from carbohydrates, less than 200mg  of dietary cholesterol, 20-35 gm of total fiber daily;Understanding of distribution of calorie intake throughout the day with the consumption of 4-5 meals/snacks   Hypertension Yes   Intervention Provide education on lifestyle modifcations including regular physical activity/exercise, weight management, moderate sodium restriction and increased consumption of fresh fruit, vegetables, and low fat dairy, alcohol moderation, and smoking cessation.;Monitor prescription use compliance.   Expected Outcomes Short Term: Continued assessment and intervention until BP is < 140/79mm HG in hypertensive participants. < 130/75mm HG in hypertensive participants with diabetes, heart failure or chronic kidney disease.;Long Term: Maintenance of blood pressure at goal levels.   Lipids Yes   Intervention Provide education and support for participant on nutrition & aerobic/resistive exercise along with prescribed medications to achieve LDL 70mg , HDL >40mg .   Expected Outcomes Short Term: Participant states understanding of desired cholesterol values and is compliant with medications prescribed. Participant is following exercise prescription and nutrition guidelines.;Long Term: Cholesterol controlled with medications as prescribed, with individualized exercise RX and with personalized nutrition plan. Value goals: LDL < 70mg , HDL > 40 mg.   Personal Goal Other Yes   Personal Goal pt desires decreased dyspnea and weight loss.  pt personal weight loss goal is 200-205lb.     Intervention offer exercise and nutritional education to facilitiate weight loss and dyspnea goals.    Expected Outcomes pt will  participate in aerobic exercise and nutritional recommendations to facilitate weight loss and dyspnea goals.       Core Components/Risk Factors/Patient Goals Review:    Core Components/Risk Factors/Patient Goals at Discharge (Final Review):    ITP Comments:     ITP Comments    Row Name 03/18/17 0920           ITP Comments Dr. Fransico Him, Medical Director           Comments:  Patient attended orientation from  0900 to 1100 to review rules and guidelines for program. Completed 6 minute walk test, Intitial ITP, and exercise prescription.  VSS. Telemetry-SR with no noted ectopy. Pt is asymptomatic and denies any cp or sob. Brief Psychosocial Assessment - reveals no barriers to participating in cardiac rehab.  Pt denies any depression, anxiety or stress.  Pt is looking forward to participating in cardiac rehab at 1: 15pm. Maurice Small RN, BSN Cardiac and Pulmonary Rehab Nurse Navigator

## 2017-03-24 ENCOUNTER — Encounter (HOSPITAL_COMMUNITY): Payer: Medicare HMO

## 2017-03-26 ENCOUNTER — Encounter (HOSPITAL_COMMUNITY): Payer: Self-pay

## 2017-03-26 ENCOUNTER — Encounter (HOSPITAL_COMMUNITY)
Admission: RE | Admit: 2017-03-26 | Discharge: 2017-03-26 | Disposition: A | Discharge status: Home / Self Care | Payer: Medicare HMO | Source: Ambulatory Visit | Attending: Internal Medicine | Admitting: Internal Medicine

## 2017-03-26 DIAGNOSIS — Z48812 Encounter for surgical aftercare following surgery on the circulatory system: Secondary | ICD-10-CM | POA: Diagnosis not present

## 2017-03-26 DIAGNOSIS — Z955 Presence of coronary angioplasty implant and graft: Secondary | ICD-10-CM | POA: Diagnosis not present

## 2017-03-26 NOTE — Progress Notes (Signed)
Daily Session Note  Patient Details  Name: KAAMIL MOREFIELD MRN: [id number] Date of Birth: February 16, 1946 Referring Provider:     CARDIAC REHAB PHASE II ORIENTATION from 03/18/2017 in [place]  Referring Provider  Lyman Bishop, MD.      Encounter Date: 03/26/2017  Check In:     Session Check In - 03/26/17 1441      Check-In   Location [facility]   Staff Present Luetta Nutting Fair, MS, ACSM RCEP, Exercise Physiologist;Athalie Newhard, RN, Marga Melnick, RN, BSN   Supervising physician immediately available to respond to emergencies [facility] immediately available   Physician(s) Dr Allyson Sabal   Medication changes reported     No   Fall or balance concerns reported    No   Tobacco Cessation No Change   Warm-up and Cool-down Performed as group-led instruction   Resistance Training Performed No   VAD Patient? No     Pain Assessment   Currently in Pain? No/denies      Capillary Blood Glucose: No results found for this or any previous visit (from the past 24 hour(s)).    History  Smoking Status  . Never Smoker  Smokeless Tobacco  . Never Used    Goals Met:  Exercise tolerated well  Goals Unmet:  Not Applicable Comments: Pt started cardiac rehab today.  Pt tolerated light exercise without difficulty. VSS, telemetry-sinus rhythm, occasional PVC,  asymptomatic.  Medication list reconciled. Pt denies barriers to medicaiton compliance.  PSYCHOSOCIAL ASSESSMENT:  PHQ-0. Pt exhibits positive coping skills, hopeful outlook with supportive family. No psychosocial needs identified at this time, no psychosocial interventions necessary.    Pt enjoys yard work, Technical sales engineer work, and snow skiing.    Pt oriented to exercise equipment and routine.    Understanding verbalized.   Dr. Fransico Him is Medical Director for Cardiac Rehab at [facility].

## 2017-03-26 NOTE — Progress Notes (Signed)
Daily Session Note  Patient Details  Name: Charles Villa MRN: [id number] Date of Birth: [birth year] Referring Provider:     CARDIAC REHAB PHASE II ORIENTATION from 03/18/2017 in [place]  Referring Provider  Lyman Bishop, MD.      Encounter Date: 03/26/2017  Check In:     Session Check In - 03/26/17 1441      Check-In   Location [facility]   Staff Present Luetta Nutting Fair, MS, ACSM RCEP, Exercise Physiologist;Zabian Swayne, RN, Marga Melnick, RN, BSN   Supervising physician immediately available to respond to emergencies [facility] immediately available   Physician(s) Dr Allyson Sabal   Medication changes reported     No   Fall or balance concerns reported    No   Tobacco Cessation No Change   Warm-up and Cool-down Performed as group-led instruction   Resistance Training Performed No   VAD Patient? No     Pain Assessment   Currently in Pain? No/denies      Capillary Blood Glucose: No results found for this or any previous visit (from the past 24 hour(s)).    History  Smoking Status  . Never Smoker  Smokeless Tobacco  . Never Used    Goals Met:  Exercise tolerated well  Goals Unmet:  Not Applicable  Comments: Pt started cardiac rehab today.  Pt tolerated light exercise without difficulty. VSS, telemetry-sinus rhythm, asymptomatic.  Medication list reconciled. Pt denies barriers to medicaiton compliance.  PSYCHOSOCIAL ASSESSMENT:  PHQ-0. Pt exhibits positive coping skills, hopeful outlook with supportive family. No psychosocial needs identified at this time, no psychosocial interventions necessary.    Pt enjoys walking in woods,  Sales promotion account executive. Pt goals for cardiac rehab are to lose 15-20lbs and decrease dyspnea on exertion.    Pt oriented to exercise equipment and routine.    Understanding verbalized.   Dr. Fransico Him is Medical Director for Cardiac Rehab at [facility].

## 2017-04-02 ENCOUNTER — Encounter (HOSPITAL_COMMUNITY)
Admission: RE | Admit: 2017-04-02 | Discharge: 2017-04-02 | Disposition: A | Discharge status: Home / Self Care | Payer: Medicare HMO | Source: Ambulatory Visit | Attending: Internal Medicine | Admitting: Internal Medicine

## 2017-04-02 DIAGNOSIS — Z955 Presence of coronary angioplasty implant and graft: Secondary | ICD-10-CM | POA: Insufficient documentation

## 2017-04-02 DIAGNOSIS — Z48812 Encounter for surgical aftercare following surgery on the circulatory system: Secondary | ICD-10-CM | POA: Diagnosis not present

## 2017-04-07 ENCOUNTER — Encounter (HOSPITAL_COMMUNITY)
Admission: RE | Admit: 2017-04-07 | Discharge: 2017-04-07 | Disposition: A | Discharge status: Home / Self Care | Payer: Medicare HMO | Source: Ambulatory Visit | Attending: Internal Medicine | Admitting: Internal Medicine

## 2017-04-07 DIAGNOSIS — Z48812 Encounter for surgical aftercare following surgery on the circulatory system: Secondary | ICD-10-CM | POA: Diagnosis not present

## 2017-04-07 DIAGNOSIS — Z955 Presence of coronary angioplasty implant and graft: Secondary | ICD-10-CM

## 2017-04-07 DIAGNOSIS — R69 Illness, unspecified: Secondary | ICD-10-CM | POA: Diagnosis not present

## 2017-04-09 ENCOUNTER — Encounter (HOSPITAL_COMMUNITY): Payer: Medicare HMO

## 2017-04-09 NOTE — Progress Notes (Signed)
Cardiac Individual Treatment Plan  Patient Details  Name: Charles Villa MRN: [id number] Date of Birth: 1945/12/04 Referring Provider:     CARDIAC REHAB PHASE II ORIENTATION from 03/18/2017 in [place]  Referring Provider  Lyman Bishop, MD.      Initial Encounter Date:    CARDIAC REHAB PHASE II ORIENTATION from 03/18/2017 in [facility]  Date  03/18/17  Referring Provider  Lyman Bishop, MD.      Visit Diagnosis: Stented coronary artery  Patient's Home Medications on Admission:  Current Outpatient Prescriptions:  .  aspirin 81 MG chewable tablet, Chew 1 tablet (81 mg total) by mouth daily., Disp: 30 tablet, Rfl: 0 .  atorvastatin (LIPITOR) 40 MG tablet, Take 1 tablet (40 mg total) by mouth daily., Disp: 90 tablet, Rfl: 1 .  bisoprolol-hydrochlorothiazide (ZIAC) 2.5-6.25 MG tablet, Take 1 tablet by mouth daily., Disp: 90 tablet, Rfl: 3 .  clopidogrel (PLAVIX) 75 MG tablet, Take 1 tablet (75 mg total) by mouth daily with breakfast., Disp: 90 tablet, Rfl: 2 .  clotrimazole-betamethasone (LOTRISONE) cream, Apply 1 application topically 2 (two) times daily as needed. psoriass, Disp: , Rfl:  .  [employer] OMEGA-3 KRILL OIL 300 MG CAPS, Take 1 tablet by mouth every other day., Disp: , Rfl:  .  desonide (DESONATE) 0.05 % gel, Apply 1 application topically 2 (two) times daily as needed. Skin lesions, Disp: , Rfl:  .  fluocinolone (VANOS) 0.01 % cream, Apply 1 application topically daily as needed. , Disp: , Rfl:  .  hydrocortisone (ANUSOL-HC) 2.5 % rectal cream, Place 1 application rectally 2 (two) times daily as needed for hemorrhoids or anal itching., Disp: , Rfl:  .  nitroGLYCERIN (NITROSTAT) 0.4 MG SL tablet, Place 1 tablet (0.4 mg total) under the tongue every 5 (five) minutes as needed., Disp: 25 tablet, Rfl: 3 .  pantoprazole (PROTONIX) 40 MG tablet, Take 1 tablet (40 mg total) by mouth daily., Disp: 30 tablet, Rfl: 11 .   PARoxetine (PAXIL) 30 MG tablet, Take 60 mg by mouth daily. , Disp: , Rfl: 1 .  Probiotic Product (PROBIOTIC DAILY PO), Take by mouth every other day. , Disp: , Rfl:  .  thiamine (VITAMIN B-1) 100 MG tablet, Take 100 mg by mouth daily., Disp: , Rfl:   Past Medical History: Past Medical History:  Diagnosis Date  . Anal fissure 08/17/2012   Really more gluteal crease irritation- protosol hc 2.5% prn   . BACK PAIN, CHRONIC 02/10/2007  . CAD (coronary artery disease), native coronary artery    02/24/17-PCI/DES to mLcx, normal EF.  . Diverticulosis of colon    2007  . GERD 02/23/2007  . GERD (gastroesophageal reflux disease)   . Hyperlipidemia   . Hypertension   . OCD (obsessive compulsive disorder) 01/23/2016   Ocd/hoarding issues- psychiatristis- [place] started on zoloft 100mg --> paxil  . Personal history of colonic polyps 03/14/2014   08/2005. Benign. 10 year repeat. 2010 colonoscopy in record was entered erroneously.    [name] PSORIASIS 02/10/2007   Topical therapy.     . Scalp psoriasis   . SQUAMOUS CELL CARCINOMA OF SKIN SITE UNSPECIFIED 08/24/2010   Annotation: face     Tobacco Use: History  Smoking Status  . Never Smoker  Smokeless Tobacco  . Never Used    Labs: Recent Review Flowsheet Data    Labs for ITP Cardiac and Pulmonary Rehab Latest Ref Rng & Units 01/04/2013 07/12/2013 03/07/2014 08/04/2015 02/27/2017  Cholestrol 0 - 200 mg/dL 176 208(H) 190 216(H) 194   LDLCALC 0 - 99 mg/dL 111(H) - 117(H) 131(H) 108(H)   LDLDIRECT mg/dL - 145.3 - - -   HDL >39.00 mg/dL 48.50 59.80 58.90 57.70 57.60   Trlycerides 0.0 - 149.0 mg/dL 82.0 68.0 71.0 136.0 142.0      Capillary Blood Glucose: No results found for: GLUCAP   Exercise Target Goals:    Exercise Program Goal: Individual exercise prescription set with THRR, safety & activity barriers. Participant demonstrates ability to understand and report RPE using BORG scale, to self-measure pulse accurately, and to acknowledge the importance  of the exercise prescription.  Exercise Prescription Goal: Starting with aerobic activity 30 plus minutes a day, 3 days per week for initial exercise prescription. Provide home exercise prescription and guidelines that participant acknowledges understanding prior to discharge.  Activity Barriers & Risk Stratification:     Activity Barriers & Cardiac Risk Stratification - 03/18/17 1442      Activity Barriers & Cardiac Risk Stratification   Activity Barriers History of Falls;Other (comment)   Comments Neuropathy- bilateral feet      6 Minute Walk:     6 Minute Walk    Row Name 03/18/17 1441         6 Minute Walk   Phase Initial     Distance 1484 feet     Walk Time 6 minutes     # of Rest Breaks 0     MPH 2.81     METS 2.82     RPE 12     VO2 Peak 9.85     Symptoms No     Resting HR 88 bpm     Resting BP 104/62     Max Ex. HR 90 bpm     Max Ex. BP 106/64     2 Minute Post BP 100/62        Oxygen Initial Assessment:   Oxygen Re-Evaluation:   Oxygen Discharge (Final Oxygen Re-Evaluation):   Initial Exercise Prescription:     Initial Exercise Prescription - 03/18/17 1400      Date of Initial Exercise RX and Referring Provider   Date 03/18/17   Referring Provider Lyman Bishop, MD.     Bike   Level 1   Minutes 10   METs 2.84     NuStep   Level 3   SPM 85   Minutes 10   METs 2.8     Track   Laps 11   Minutes 10   METs 2.92     Prescription Details   Frequency (times per week) 3   Duration Progress to 30 minutes of continuous aerobic without signs/symptoms of physical distress     Intensity   THRR 40-80% of Max Heartrate 60-121   Ratings of Perceived Exertion 11-13   Perceived Dyspnea 0-4     Progression   Progression Continue to progress workloads to maintain intensity without signs/symptoms of physical distress.     Resistance Training   Training Prescription Yes   Weight 4lb   Reps 10-15      Perform Capillary Blood Glucose  checks as needed.  Exercise Prescription Changes:     Exercise Prescription Changes    Row Name 03/26/17 1600 04/07/17 1700           Response to Exercise   Blood Pressure (Admit) 98/64 102/70      Blood Pressure (Exercise) 134/76 100/60      Blood Pressure (  Exit) 98/60 128/80      Heart Rate (Admit) 88 bpm 73 bpm      Heart Rate (Exercise) 99 bpm 98 bpm      Heart Rate (Exit) 71 bpm 68 bpm      Rating of Perceived Exertion (Exercise) 12 12      Symptoms nonr none      Comments pt was oriented to exercise equipment today pt was oriented to exercise equipment today      Duration Continue with 30 min of aerobic exercise without signs/symptoms of physical distress. Continue with 30 min of aerobic exercise without signs/symptoms of physical distress.      Intensity THRR unchanged THRR unchanged        Progression   Progression Continue to progress workloads to maintain intensity without signs/symptoms of physical distress. Continue to progress workloads to maintain intensity without signs/symptoms of physical distress.      Average METs 2.8 2.7        Resistance Training   Training Prescription Yes Yes      Weight 2lbs 2lbs      Reps 10-15 10-15      Time 10 Minutes 10 Minutes        Bike   Level 1  -      Minutes 15  -      METs 2.84  -        NuStep   Level  - 3      SPM  - 85      Minutes  - 15      METs  - 2.6        Track   Laps 16 15      Minutes 15 15      METs 2.86 2.74         Exercise Comments:     Exercise Comments    Row Name 03/26/17 1650           Exercise Comments Pt was oriented to exercise equipment today. Pt responded well to first exercise session; will continue to monitor pt's progress/activity levels.          Exercise Goals and Review:     Exercise Goals    Row Name 03/18/17 1444             Exercise Goals   Increase Physical Activity Yes       Intervention Provide advice, education, support and counseling about physical  activity/exercise needs.;Develop an individualized exercise prescription for aerobic and resistive training based on initial evaluation findings, risk stratification, comorbidities and participant's personal goals.       Expected Outcomes Achievement of increased cardiorespiratory fitness and enhanced flexibility, muscular endurance and strength shown through measurements of functional capacity and personal statement of participant.       Increase Strength and Stamina Yes       Intervention Provide advice, education, support and counseling about physical activity/exercise needs.;Develop an individualized exercise prescription for aerobic and resistive training based on initial evaluation findings, risk stratification, comorbidities and participant's personal goals.       Expected Outcomes Achievement of increased cardiorespiratory fitness and enhanced flexibility, muscular endurance and strength shown through measurements of functional capacity and personal statement of participant.          Exercise Goals Re-Evaluation :     Exercise Goals Re-Evaluation    [place] Name 04/08/17 1502             Exercise Goal Re-Evaluation  Exercise Goals Review Increase Physical Activity;Able to understand and use rate of perceived exertion (RPE) scale;Knowledge and understanding of Target Heart Rate Range (THRR);Understanding of Exercise Prescription;Increase Strength and Stamina;Able to check pulse independently       Comments Pt is exercising for 20-30 minutes in cardiac rehab. Expressed to pt the importance of 30 minutes of aerobic activity for heart benefits.       Expected Outcomes Pt will exercise independently and safely and improve on cardiorespiratory fitness           Discharge Exercise Prescription (Final Exercise Prescription Changes):     Exercise Prescription Changes - 04/07/17 1700      Response to Exercise   Blood Pressure (Admit) 102/70   Blood Pressure (Exercise) 100/60   Blood  Pressure (Exit) 128/80   Heart Rate (Admit) 73 bpm   Heart Rate (Exercise) 98 bpm   Heart Rate (Exit) 68 bpm   Rating of Perceived Exertion (Exercise) 12   Symptoms none   Comments pt was oriented to exercise equipment today   Duration Continue with 30 min of aerobic exercise without signs/symptoms of physical distress.   Intensity THRR unchanged     Progression   Progression Continue to progress workloads to maintain intensity without signs/symptoms of physical distress.   Average METs 2.7     Resistance Training   Training Prescription Yes   Weight 2lbs   Reps 10-15   Time 10 Minutes     NuStep   Level 3   SPM 85   Minutes 15   METs 2.6     Track   Laps 15   Minutes 15   METs 2.74      Nutrition:  Target Goals: Understanding of nutrition guidelines, daily intake of sodium 1500mg , cholesterol 200mg , calories 30% from fat and 7% or less from saturated fats, daily to have 5 or more servings of fruits and vegetables.  Biometrics:     Pre Biometrics - 03/18/17 1030      Pre Biometrics   Height 5\' 11"  (1.803 m)   Weight 225 lb 15.5 oz (102.5 kg)   Waist Circumference 42 inches   Hip Circumference 44.5 inches   Waist to Hip Ratio 0.94 %   BMI (Calculated) 31.53   Triceps Skinfold 21 mm   % Body Fat 31 %   Grip Strength 46.5 kg   Flexibility 0 in   Single Leg Stand 10.78 seconds       Nutrition Therapy Plan and Nutrition Goals:   Nutrition Discharge: Nutrition Scores:   Nutrition Goals Re-Evaluation:   Nutrition Goals Re-Evaluation:   Nutrition Goals Discharge (Final Nutrition Goals Re-Evaluation):   Psychosocial: Target Goals: Acknowledge presence or absence of significant depression and/or stress, maximize coping skills, provide positive support system. Participant is able to verbalize types and ability to use techniques and skills needed for reducing stress and depression.  Initial Review & Psychosocial Screening:     Initial Psych Review &  Screening - 03/18/17 1117      Initial Review   Current issues with None Identified     Family Dynamics   Good Support System? Yes  spouse    Comments upon brief assessment, no psychosocial needs identified, no interventions necessary      Barriers   Psychosocial barriers to participate in program There are no identifiable barriers or psychosocial needs.     Screening Interventions   Interventions Encouraged to exercise      Quality of Life Scores:  Quality of Life - 03/18/17 1110      Quality of Life Scores   Health/Function Pre 22.7 %   Socioeconomic Pre 22.06 %   Psych/Spiritual Pre 20.79 %   Family Pre 23.7 %   GLOBAL Pre 22.3 %      PHQ-9: Recent Review Flowsheet Data    Depression screen [facility] 2/9 03/26/2017 03/26/2017 02/03/2017 10/07/2016 08/07/2015   Decreased Interest 0 0 0 0 0   Down, Depressed, Hopeless 0 0 0 0 0   PHQ - 2 Score 0 0 0 0 0     Interpretation of Total Score  Total Score Depression Severity:  1-4 = Minimal depression, 5-9 = Mild depression, 10-14 = Moderate depression, 15-19 = Moderately severe depression, 20-27 = Severe depression   Psychosocial Evaluation and Intervention:     Psychosocial Evaluation - 03/26/17 1445      Psychosocial Evaluation & Interventions   Interventions Encouraged to exercise with the program and follow exercise prescription   Comments no psychosocial needs identified, no interventions necessary.  pt enjoys being outdoors, yard work, [employer] work. pt also enjoys snow skiing.    Expected Outcomes pt will exhibit positive outlook with good coping skills.     Continue Psychosocial Services  No Follow up required      Psychosocial Re-Evaluation:     Psychosocial Re-Evaluation    [place] Name 04/09/17 1722             Psychosocial Re-Evaluation   Current issues with None Identified       Comments no psychosocial needs identified, no interventions necessary        Expected Outcomes pt will  exhibit positive outlook with good coping skills.        Interventions Stress management education;Relaxation education;Encouraged to attend Cardiac Rehabilitation for the exercise       Continue Psychosocial Services  No Follow up required          Psychosocial Discharge (Final Psychosocial Re-Evaluation):     Psychosocial Re-Evaluation - 04/09/17 1722      Psychosocial Re-Evaluation   Current issues with None Identified   Comments no psychosocial needs identified, no interventions necessary    Expected Outcomes pt will exhibit positive outlook with good coping skills.    Interventions Stress management education;Relaxation education;Encouraged to attend Cardiac Rehabilitation for the exercise   Continue Psychosocial Services  No Follow up required      Vocational Rehabilitation: Provide vocational rehab assistance to qualifying candidates.   Vocational Rehab Evaluation & Intervention:     Vocational Rehab - 03/18/17 1110      Initial Vocational Rehab Evaluation & Intervention   Assessment shows need for Vocational Rehabilitation No      Education: Education Goals: Education classes will be provided on a weekly basis, covering required topics. Participant will state understanding/return demonstration of topics presented.  Learning Barriers/Preferences:     Learning Barriers/Preferences - 03/18/17 1445      Learning Barriers/Preferences   Learning Barriers None   Learning Preferences Written Material;Video      Education Topics: Count Your Pulse:  -Group instruction provided by verbal instruction, demonstration, patient participation and written materials to support subject.  Instructors address importance of being able to find your pulse and how to count your pulse when at home without a heart monitor.  Patients get hands on experience counting their pulse with staff help and individually.   Heart Attack, Angina, and Risk Factor Modification:  -Group instruction  provided  by verbal instruction, video, and written materials to support subject.  Instructors address signs and symptoms of angina and heart attacks.    Also discuss risk factors for heart disease and how to make changes to improve heart health risk factors.   Functional Fitness:  -Group instruction provided by verbal instruction, demonstration, patient participation, and written materials to support subject.  Instructors address safety measures for doing things around the house.  Discuss how to get up and down off the floor, how to pick things up properly, how to safely get out of a chair without assistance, and balance training.   Meditation and Mindfulness:  -Group instruction provided by verbal instruction, patient participation, and written materials to support subject.  Instructor addresses importance of mindfulness and meditation practice to help reduce stress and improve awareness.  Instructor also leads participants through a meditation exercise.    Stretching for Flexibility and Mobility:  -Group instruction provided by verbal instruction, patient participation, and written materials to support subject.  Instructors lead participants through series of stretches that are designed to increase flexibility thus improving mobility.  These stretches are additional exercise for major muscle groups that are typically performed during regular warm up and cool down.   Hands Only CPR:  -Group verbal, video, and participation provides a basic overview of AHA guidelines for community CPR. Role-play of emergencies allow participants the opportunity to practice calling for help and chest compression technique with discussion of AED use.   Hypertension: -Group verbal and written instruction that provides a basic overview of hypertension including the most recent diagnostic guidelines, risk factor reduction with self-care instructions and medication management.    Nutrition I class: Heart Healthy  Eating:  -Group instruction provided by PowerPoint slides, verbal discussion, and written materials to support subject matter. The instructor gives an explanation and review of the Therapeutic Lifestyle Changes diet recommendations, which includes a discussion on lipid goals, dietary fat, sodium, fiber, plant stanol/sterol esters, sugar, and the components of a well-balanced, healthy diet.   Nutrition II class: Lifestyle Skills:  -Group instruction provided by PowerPoint slides, verbal discussion, and written materials to support subject matter. The instructor gives an explanation and review of label reading, grocery shopping for heart health, heart healthy recipe modifications, and ways to make healthier choices when eating out.   Diabetes Question & Answer:  -Group instruction provided by PowerPoint slides, verbal discussion, and written materials to support subject matter. The instructor gives an explanation and review of diabetes co-morbidities, pre- and post-prandial blood glucose goals, pre-exercise blood glucose goals, signs, symptoms, and treatment of hypoglycemia and hyperglycemia, and foot care basics.   Diabetes Blitz:  -Group instruction provided by PowerPoint slides, verbal discussion, and written materials to support subject matter. The instructor gives an explanation and review of the physiology behind type 1 and type 2 diabetes, diabetes medications and rational behind using different medications, pre- and post-prandial blood glucose recommendations and Hemoglobin A1c goals, diabetes diet, and exercise including blood glucose guidelines for exercising safely.    Portion Distortion:  -Group instruction provided by PowerPoint slides, verbal discussion, written materials, and food models to support subject matter. The instructor gives an explanation of serving size versus portion size, changes in portions sizes over the last 20 years, and what consists of a serving from each food  group.   Stress Management:  -Group instruction provided by verbal instruction, video, and written materials to support subject matter.  Instructors review role of stress in heart disease and how to  cope with stress positively.     Exercising on Your Own:  -Group instruction provided by verbal instruction, power point, and written materials to support subject.  Instructors discuss benefits of exercise, components of exercise, frequency and intensity of exercise, and end points for exercise.  Also discuss use of nitroglycerin and activating EMS.  Review options of places to exercise outside of rehab.  Review guidelines for sex with heart disease.   Cardiac Drugs I:  -Group instruction provided by verbal instruction and written materials to support subject.  Instructor reviews cardiac drug classes: antiplatelets, anticoagulants, beta blockers, and statins.  Instructor discusses reasons, side effects, and lifestyle considerations for each drug class.   Cardiac Drugs II:  -Group instruction provided by verbal instruction and written materials to support subject.  Instructor reviews cardiac drug classes: angiotensin converting enzyme inhibitors (ACE-I), angiotensin II receptor blockers (ARBs), nitrates, and calcium channel blockers.  Instructor discusses reasons, side effects, and lifestyle considerations for each drug class.   Anatomy and Physiology of the Circulatory System:  Group verbal and written instruction and models provide basic cardiac anatomy and physiology, with the coronary electrical and arterial systems. Review of: AMI, Angina, Valve disease, Heart Failure, Peripheral Artery Disease, Cardiac Arrhythmia, Pacemakers, and the ICD.   Other Education:  -Group or individual verbal, written, or video instructions that support the educational goals of the cardiac rehab program.   Knowledge Questionnaire Score:     Knowledge Questionnaire Score - 03/18/17 1110      Knowledge  Questionnaire Score   Pre Score 23/24      Core Components/Risk Factors/Patient Goals at Admission:     Personal Goals and Risk Factors at Admission - 03/18/17 1354      Core Components/Risk Factors/Patient Goals on Admission    Weight Management Obesity;Yes   Intervention Weight Management/Obesity: Establish reasonable short term and long term weight goals.;Obesity: Provide education and appropriate resources to help participant work on and attain dietary goals.   Admit Weight 225 lb 15.5 oz (102.5 kg)   Expected Outcomes Short Term: Continue to assess and modify interventions until short term weight is achieved;Long Term: Adherence to nutrition and physical activity/exercise program aimed toward attainment of established weight goal;Weight Loss: Understanding of general recommendations for a balanced deficit meal plan, which promotes 1-2 lb weight loss per week and includes a negative energy balance of [phone] kcal/d;Understanding recommendations for meals to include 15-35% energy as protein, 25-35% energy from fat, 35-60% energy from carbohydrates, less than 200mg  of dietary cholesterol, 20-35 gm of total fiber daily;Understanding of distribution of calorie intake throughout the day with the consumption of 4-5 meals/snacks   Hypertension Yes   Intervention Provide education on lifestyle modifcations including regular physical activity/exercise, weight management, moderate sodium restriction and increased consumption of fresh fruit, vegetables, and low fat dairy, alcohol moderation, and smoking cessation.;Monitor prescription use compliance.   Expected Outcomes Short Term: Continued assessment and intervention until BP is < 140/28mm HG in hypertensive participants. < 130/55mm HG in hypertensive participants with diabetes, heart failure or chronic kidney disease.;Long Term: Maintenance of blood pressure at goal levels.   Lipids Yes   Intervention Provide education and support for participant on  nutrition & aerobic/resistive exercise along with prescribed medications to achieve LDL 70mg , HDL >40mg .   Expected Outcomes Short Term: Participant states understanding of desired cholesterol values and is compliant with medications prescribed. Participant is following exercise prescription and nutrition guidelines.;Long Term: Cholesterol controlled with medications as prescribed, with individualized exercise RX and  with personalized nutrition plan. Value goals: LDL < 70mg , HDL > 40 mg.   Personal Goal Other Yes   Personal Goal pt desires decreased dyspnea and weight loss.  pt personal weight loss goal is 200-205lb.     Intervention offer exercise and nutritional education to facilitiate weight loss and dyspnea goals.    Expected Outcomes pt will participate in aerobic exercise and nutritional recommendations to facilitate weight loss and dyspnea goals.       Core Components/Risk Factors/Patient Goals Review:      Goals and Risk Factor Review    Row Name 04/09/17 1718             Core Components/Risk Factors/Patient Goals Review   Personal Goals Review Weight Management/Obesity;Other;Hypertension;Lipids;Develop more efficient breathing techniques such as purse lipped breathing and diaphragmatic breathing and practicing self-pacing with activity.       Review pt with CAD RF demonstrates inconsistent attendance in beginning CR sessions.  pt encouraged to participate in CR exercise, nutrition and educaiton opportunities to reduce symptoms, RF and improved health benefit.        Expected Outcomes pt will participate in CR exercise, nutrition and lifestyle modifications to reduce overall RF, improve symptoms and health benefits.           Core Components/Risk Factors/Patient Goals at Discharge (Final Review):      Goals and Risk Factor Review - 04/09/17 1718      Core Components/Risk Factors/Patient Goals Review   Personal Goals Review Weight  Management/Obesity;Other;Hypertension;Lipids;Develop more efficient breathing techniques such as purse lipped breathing and diaphragmatic breathing and practicing self-pacing with activity.   Review pt with CAD RF demonstrates inconsistent attendance in beginning CR sessions.  pt encouraged to participate in CR exercise, nutrition and educaiton opportunities to reduce symptoms, RF and improved health benefit.    Expected Outcomes pt will participate in CR exercise, nutrition and lifestyle modifications to reduce overall RF, improve symptoms and health benefits.       ITP Comments:     ITP Comments    Row Name 03/18/17 0920 04/09/17 1717         ITP Comments Dr. Fransico Him, Medical Director  Dr. Fransico Him, Medical Director          Comments: Pt is making slower than expected progress toward personal goals after completing 3 sessions.  Pt with a few absences.  Recommend continued exercise and life style modification education including  stress management and relaxation techniques to decrease cardiac risk profile.

## 2017-04-14 ENCOUNTER — Encounter (HOSPITAL_COMMUNITY)
Admission: RE | Admit: 2017-04-14 | Discharge: 2017-04-14 | Disposition: A | Discharge status: Home / Self Care | Payer: Medicare HMO | Source: Ambulatory Visit | Attending: Internal Medicine | Admitting: Internal Medicine

## 2017-04-14 DIAGNOSIS — Z955 Presence of coronary angioplasty implant and graft: Secondary | ICD-10-CM | POA: Diagnosis not present

## 2017-04-14 DIAGNOSIS — Z48812 Encounter for surgical aftercare following surgery on the circulatory system: Secondary | ICD-10-CM | POA: Diagnosis not present

## 2017-04-16 ENCOUNTER — Encounter (HOSPITAL_COMMUNITY)
Admission: RE | Admit: 2017-04-16 | Discharge: 2017-04-16 | Disposition: A | Discharge status: Home / Self Care | Payer: Medicare HMO | Source: Ambulatory Visit | Attending: Internal Medicine | Admitting: Internal Medicine

## 2017-04-16 DIAGNOSIS — Z955 Presence of coronary angioplasty implant and graft: Secondary | ICD-10-CM | POA: Diagnosis not present

## 2017-04-16 DIAGNOSIS — Z48812 Encounter for surgical aftercare following surgery on the circulatory system: Secondary | ICD-10-CM | POA: Diagnosis not present

## 2017-04-16 NOTE — Progress Notes (Signed)
Reviewed home exercise with pt today.  Pt plans to walk for exercise, 2x/week in addition to coming to cardiac rehab.  Reviewed THR, pulse, RPE, sign and symptoms, NTG use, and when to call 911 or MD.  Also discussed weather considerations and indoor options.  Pt voiced understanding.    Krystal Teachey,MS,ACSM RCEP 

## 2017-04-21 ENCOUNTER — Encounter (HOSPITAL_COMMUNITY)
Admission: RE | Admit: 2017-04-21 | Discharge: 2017-04-21 | Disposition: A | Discharge status: Home / Self Care | Payer: Medicare HMO | Source: Ambulatory Visit | Attending: Internal Medicine | Admitting: Internal Medicine

## 2017-04-21 DIAGNOSIS — Z955 Presence of coronary angioplasty implant and graft: Secondary | ICD-10-CM | POA: Diagnosis not present

## 2017-04-21 DIAGNOSIS — Z48812 Encounter for surgical aftercare following surgery on the circulatory system: Secondary | ICD-10-CM | POA: Diagnosis not present

## 2017-04-23 ENCOUNTER — Encounter (HOSPITAL_COMMUNITY)
Admission: RE | Admit: 2017-04-23 | Discharge: 2017-04-23 | Disposition: A | Discharge status: Home / Self Care | Payer: Medicare HMO | Source: Ambulatory Visit | Attending: Internal Medicine | Admitting: Internal Medicine

## 2017-04-23 DIAGNOSIS — Z955 Presence of coronary angioplasty implant and graft: Secondary | ICD-10-CM

## 2017-04-23 DIAGNOSIS — Z48812 Encounter for surgical aftercare following surgery on the circulatory system: Secondary | ICD-10-CM | POA: Diagnosis not present

## 2017-04-28 ENCOUNTER — Encounter (HOSPITAL_COMMUNITY)
Admission: RE | Admit: 2017-04-28 | Discharge: 2017-04-28 | Disposition: A | Discharge status: Home / Self Care | Payer: Medicare HMO | Source: Ambulatory Visit | Attending: Internal Medicine | Admitting: Internal Medicine

## 2017-04-28 ENCOUNTER — Encounter: Payer: Self-pay | Admitting: Family Medicine

## 2017-04-28 ENCOUNTER — Other Ambulatory Visit (INDEPENDENT_AMBULATORY_CARE_PROVIDER_SITE_OTHER): Payer: Medicare HMO

## 2017-04-28 ENCOUNTER — Other Ambulatory Visit: Payer: Self-pay | Admitting: Family Medicine

## 2017-04-28 DIAGNOSIS — Z955 Presence of coronary angioplasty implant and graft: Secondary | ICD-10-CM | POA: Diagnosis not present

## 2017-04-28 DIAGNOSIS — I10 Essential (primary) hypertension: Secondary | ICD-10-CM | POA: Diagnosis not present

## 2017-04-28 DIAGNOSIS — Z48812 Encounter for surgical aftercare following surgery on the circulatory system: Secondary | ICD-10-CM | POA: Insufficient documentation

## 2017-04-28 DIAGNOSIS — E519 Thiamine deficiency, unspecified: Secondary | ICD-10-CM

## 2017-04-28 LAB — HEPATIC FUNCTION PANEL
ALBUMIN: 3.8 g/dL (ref 3.5–5.2)
ALT: 24 U/L (ref 0–53)
AST: 24 U/L (ref 0–37)
Alkaline Phosphatase: 45 U/L (ref 39–117)
Bilirubin, Direct: 0.1 mg/dL (ref 0.0–0.3)
TOTAL PROTEIN: 6.7 g/dL (ref 6.0–8.3)
Total Bilirubin: 0.4 mg/dL (ref 0.2–1.2)

## 2017-04-28 LAB — LIPID PANEL
CHOLESTEROL: 162 mg/dL (ref 0–200)
HDL: 39.3 mg/dL (ref >39.00)
LDL Cholesterol: 103 mg/dL — ABNORMAL HIGH (ref 0–99)
NonHDL: 122.48
TRIGLYCERIDES: 96 mg/dL (ref 0.0–149.0)
Total CHOL/HDL Ratio: 4
VLDL: 19.2 mg/dL (ref 0.0–40.0)

## 2017-04-28 NOTE — Progress Notes (Signed)
DMARI Villa 71 y.o. male DOB: 1945-10-30 MRN: [id number]      Nutrition Note  S/p DES LCX Past Medical History:  Diagnosis Date  . Anal fissure 08/17/2012   Really more gluteal crease irritation- protosol hc 2.5% prn   . BACK PAIN, CHRONIC 02/10/2007  . CAD (coronary artery disease), native coronary artery    02/24/17-PCI/DES to mLcx, normal EF.  . Diverticulosis of colon    2007  . GERD 02/23/2007  . GERD (gastroesophageal reflux disease)   . Hyperlipidemia   . Hypertension   . OCD (obsessive compulsive disorder) 01/23/2016   Ocd/hoarding issues- psychiatristis- Cottle started on zoloft 100mg --> paxil  . Personal history of colonic polyps 03/14/2014   08/2005. Benign. 10 year repeat. 2010 colonoscopy in record was entered erroneously.    [name] PSORIASIS 02/10/2007   Topical therapy.     . Scalp psoriasis   . SQUAMOUS CELL CARCINOMA OF SKIN SITE UNSPECIFIED 08/24/2010   Annotation: face    Meds reviewed.   HT: Ht Readings from Last 1 Encounters:  03/18/17 5\' 11"  (1.803 m)    WT: Wt Readings from Last 3 Encounters:  03/18/17 225 lb 15.5 oz (102.5 kg)  03/17/17 224 lb (101.6 kg)  03/03/17 227 lb 9.6 oz (103.2 kg)     BMI 31.5   Current tobacco use? No  Labs:  Lipid Panel     Component Value Date/Time   CHOL 162 04/28/2017 0836   TRIG 96.0 04/28/2017 0836   TRIG 89 08/11/2006 0818   HDL 39.30 04/28/2017 0836   CHOLHDL 4 04/28/2017 0836   VLDL 19.2 04/28/2017 0836   LDLCALC 103 (H) 04/28/2017 0836   LDLDIRECT 145.3 07/12/2013 1046    No results found for: HGBA1C CBG (last 3)  No results for input(s): GLUCAP in the last 72 hours.  Nutrition Note Spoke with pt. Nutrition plan and survey reviewed with pt. Pt is following Step 1 of the Therapeutic Lifestyle Changes diet. Pt wants to lose wt. Pt has been trying to lose wt by decreasing portion sizes consumed. Wt loss tips reviewed. Pt expressed understanding of the information reviewed. Pt aware of nutrition education  classes offered.  Nutrition Diagnosis ? Food-and nutrition-related knowledge deficit related to lack of exposure to information as related to diagnosis of: ? CVD  ? Obesity related to excessive energy intake as evidenced by a BMI of 31.5  Nutrition Intervention ? Pt's individual nutrition plan and goals reviewed with pt. ? Pt given handouts for: ? Nutrition I class ? Nutrition II class   Nutrition Goal(s):  ? Pt to identify and limit food sources of saturated fat, trans fat, and sodium ? Pt to identify food quantities necessary to achieve weight loss of 6-15 lb at graduation from cardiac rehab.   Plan:  Pt to attend nutrition classes ? Nutrition I ? Nutrition II ? Portion Distortion  Will provide client-centered nutrition education as part of interdisciplinary care.   Monitor and evaluate progress toward nutrition goal with team.  Charles Villa, M.Ed, RD, LDN, CDE 04/28/2017 2:13 PM

## 2017-04-28 NOTE — Progress Notes (Signed)
Per patient- request from neurology for b1 check

## 2017-04-30 ENCOUNTER — Other Ambulatory Visit: Payer: Self-pay | Admitting: *Deleted

## 2017-04-30 ENCOUNTER — Encounter (HOSPITAL_COMMUNITY): Payer: Medicare HMO

## 2017-04-30 MED ORDER — ROSUVASTATIN CALCIUM 40 MG PO TABS: 40.0000 mg | ORAL_TABLET | Freq: Every day | ORAL | 3 refills | Status: DC

## 2017-05-04 LAB — VITAMIN B1: Vitamin B1 (Thiamine): 44 nmol/L — ABNORMAL HIGH (ref 8–30)

## 2017-05-05 ENCOUNTER — Encounter (HOSPITAL_COMMUNITY): Payer: Medicare HMO

## 2017-05-07 ENCOUNTER — Encounter (HOSPITAL_COMMUNITY): Admission: RE | Admit: 2017-05-07 | Payer: Medicare HMO | Source: Ambulatory Visit

## 2017-05-07 ENCOUNTER — Encounter (HOSPITAL_COMMUNITY)
Admission: RE | Admit: 2017-05-07 | Discharge: 2017-05-07 | Disposition: A | Discharge status: Home / Self Care | Payer: Medicare HMO | Source: Ambulatory Visit | Attending: Internal Medicine | Admitting: Internal Medicine

## 2017-05-07 ENCOUNTER — Encounter (HOSPITAL_COMMUNITY): Payer: Self-pay

## 2017-05-07 DIAGNOSIS — Z955 Presence of coronary angioplasty implant and graft: Secondary | ICD-10-CM | POA: Diagnosis not present

## 2017-05-07 DIAGNOSIS — Z48812 Encounter for surgical aftercare following surgery on the circulatory system: Secondary | ICD-10-CM | POA: Diagnosis not present

## 2017-05-07 NOTE — Progress Notes (Signed)
Cardiac Individual Treatment Plan  Patient Details  Name: Charles Villa MRN: [id number] Date of Birth: Jun 18, 1946 Referring Provider:     CARDIAC REHAB PHASE II ORIENTATION from 03/18/2017 in [place]  Referring Provider  Lyman Bishop, MD.      Initial Encounter Date:    CARDIAC REHAB PHASE II ORIENTATION from 03/18/2017 in [place]  Date  03/18/17  Referring Provider  Lyman Bishop, MD.      Visit Diagnosis: No diagnosis found.  Patient's Home Medications on Admission:  Current Outpatient Prescriptions:  .  aspirin 81 MG chewable tablet, Chew 1 tablet (81 mg total) by mouth daily., Disp: 30 tablet, Rfl: 0 .  atorvastatin (LIPITOR) 40 MG tablet, Take 1 tablet (40 mg total) by mouth daily., Disp: 90 tablet, Rfl: 1 .  bisoprolol-hydrochlorothiazide (ZIAC) 2.5-6.25 MG tablet, Take 1 tablet by mouth daily., Disp: 90 tablet, Rfl: 3 .  clopidogrel (PLAVIX) 75 MG tablet, Take 1 tablet (75 mg total) by mouth daily with breakfast., Disp: 90 tablet, Rfl: 2 .  clotrimazole-betamethasone (LOTRISONE) cream, Apply 1 application topically 2 (two) times daily as needed. psoriass, Disp: , Rfl:  .  [employer] OMEGA-3 KRILL OIL 300 MG CAPS, Take 1 tablet by mouth every other day., Disp: , Rfl:  .  desonide (DESONATE) 0.05 % gel, Apply 1 application topically 2 (two) times daily as needed. Skin lesions, Disp: , Rfl:  .  fluocinolone (VANOS) 0.01 % cream, Apply 1 application topically daily as needed. , Disp: , Rfl:  .  hydrocortisone (ANUSOL-HC) 2.5 % rectal cream, Place 1 application rectally 2 (two) times daily as needed for hemorrhoids or anal itching., Disp: , Rfl:  .  nitroGLYCERIN (NITROSTAT) 0.4 MG SL tablet, Place 1 tablet (0.4 mg total) under the tongue every 5 (five) minutes as needed., Disp: 25 tablet, Rfl: 3 .  pantoprazole (PROTONIX) 40 MG tablet, Take 1 tablet (40 mg total) by mouth daily., Disp: 30 tablet, Rfl: 11 .   PARoxetine (PAXIL) 30 MG tablet, Take 60 mg by mouth daily. , Disp: , Rfl: 1 .  Probiotic Product (PROBIOTIC DAILY PO), Take by mouth every other day. , Disp: , Rfl:  .  rosuvastatin (CRESTOR) 40 MG tablet, Take 1 tablet (40 mg total) by mouth daily., Disp: 90 tablet, Rfl: 3 .  thiamine (VITAMIN B-1) 100 MG tablet, Take 100 mg by mouth daily., Disp: , Rfl:   Past Medical History: Past Medical History:  Diagnosis Date  . Anal fissure 08/17/2012   Really more gluteal crease irritation- protosol hc 2.5% prn   . BACK PAIN, CHRONIC 02/10/2007  . CAD (coronary artery disease), native coronary artery    02/24/17-PCI/DES to mLcx, normal EF.  . Diverticulosis of colon    2007  . GERD 02/23/2007  . GERD (gastroesophageal reflux disease)   . Hyperlipidemia   . Hypertension   . OCD (obsessive compulsive disorder) 01/23/2016   Ocd/hoarding issues- psychiatristis- [place] started on zoloft 100mg --> paxil  . Personal history of colonic polyps 03/14/2014   08/2005. Benign. 10 year repeat. 2010 colonoscopy in record was entered erroneously.    [name] PSORIASIS 02/10/2007   Topical therapy.     . Scalp psoriasis   . SQUAMOUS CELL CARCINOMA OF SKIN SITE UNSPECIFIED 08/24/2010   Annotation: face     Tobacco Use: History  Smoking Status  . Never Smoker  Smokeless Tobacco  . Never Used    Labs: Recent Review Flowsheet Data  Labs for ITP Cardiac and Pulmonary Rehab Latest Ref Rng & Units 07/12/2013 03/07/2014 08/04/2015 02/27/2017 04/28/2017   Cholestrol 0 - 200 mg/dL 208(H) 190 216(H) 194 162   LDLCALC 0 - 99 mg/dL - 117(H) 131(H) 108(H) 103(H)   LDLDIRECT mg/dL 145.3 - - - -   HDL >39.00 mg/dL 59.80 58.90 57.70 57.60 39.30   Trlycerides 0.0 - 149.0 mg/dL 68.0 71.0 136.0 142.0 96.0      Capillary Blood Glucose: No results found for: GLUCAP   Exercise Target Goals:    Exercise Program Goal: Individual exercise prescription set with THRR, safety & activity barriers. Participant demonstrates ability to  understand and report RPE using BORG scale, to self-measure pulse accurately, and to acknowledge the importance of the exercise prescription.  Exercise Prescription Goal: Starting with aerobic activity 30 plus minutes a day, 3 days per week for initial exercise prescription. Provide home exercise prescription and guidelines that participant acknowledges understanding prior to discharge.  Activity Barriers & Risk Stratification:     Activity Barriers & Cardiac Risk Stratification - 03/18/17 1442      Activity Barriers & Cardiac Risk Stratification   Activity Barriers History of Falls;Other (comment)   Comments Neuropathy- bilateral feet      6 Minute Walk:     6 Minute Walk    Row Name 03/18/17 1441         6 Minute Walk   Phase Initial     Distance 1484 feet     Walk Time 6 minutes     # of Rest Breaks 0     MPH 2.81     METS 2.82     RPE 12     VO2 Peak 9.85     Symptoms No     Resting HR 88 bpm     Resting BP 104/62     Max Ex. HR 90 bpm     Max Ex. BP 106/64     2 Minute Post BP 100/62        Oxygen Initial Assessment:   Oxygen Re-Evaluation:   Oxygen Discharge (Final Oxygen Re-Evaluation):   Initial Exercise Prescription:     Initial Exercise Prescription - 03/18/17 1400      Date of Initial Exercise RX and Referring Provider   Date 03/18/17   Referring Provider Lyman Bishop, MD.     Bike   Level 1   Minutes 10   METs 2.84     NuStep   Level 3   SPM 85   Minutes 10   METs 2.8     Track   Laps 11   Minutes 10   METs 2.92     Prescription Details   Frequency (times per week) 3   Duration Progress to 30 minutes of continuous aerobic without signs/symptoms of physical distress     Intensity   THRR 40-80% of Max Heartrate 60-121   Ratings of Perceived Exertion 11-13   Perceived Dyspnea 0-4     Progression   Progression Continue to progress workloads to maintain intensity without signs/symptoms of physical distress.      Resistance Training   Training Prescription Yes   Weight 4lb   Reps 10-15      Perform Capillary Blood Glucose checks as needed.  Exercise Prescription Changes:     Exercise Prescription Changes    Row Name 03/26/17 1600 04/07/17 1700 04/21/17 1019 04/28/17 1017       Response to Exercise   Blood Pressure (Admit)  98/64 102/70 114/65 120/70    Blood Pressure (Exercise) 134/76 100/60 120/82 140/70    Blood Pressure (Exit) 98/60 128/80 100/62 102/78    Heart Rate (Admit) 88 bpm 73 bpm 67 bpm 78 bpm    Heart Rate (Exercise) 99 bpm 98 bpm 93 bpm 96 bpm    Heart Rate (Exit) 71 bpm 68 bpm 62 bpm 78 bpm    Rating of Perceived Exertion (Exercise) 12 12 12 12     Symptoms nonr none none none    Comments pt was oriented to exercise equipment today pt was oriented to exercise equipment today  -  -    Duration Continue with 30 min of aerobic exercise without signs/symptoms of physical distress. Continue with 30 min of aerobic exercise without signs/symptoms of physical distress. Continue with 30 min of aerobic exercise without signs/symptoms of physical distress. Continue with 30 min of aerobic exercise without signs/symptoms of physical distress.    Intensity THRR unchanged THRR unchanged THRR unchanged THRR unchanged      Progression   Progression Continue to progress workloads to maintain intensity without signs/symptoms of physical distress. Continue to progress workloads to maintain intensity without signs/symptoms of physical distress. Continue to progress workloads to maintain intensity without signs/symptoms of physical distress. Continue to progress workloads to maintain intensity without signs/symptoms of physical distress.    Average METs 2.8 2.7 2.9 3.6      Resistance Training   Training Prescription Yes Yes Yes Yes    Weight 2lbs 2lbs 3lbs 4lbs    Reps 10-15 10-15 10-15 10-15    Time 10 Minutes 10 Minutes 10 Minutes 10 Minutes      Bike   Level 1  -  -  -    Minutes 15  -  -   -    METs 2.84  -  -  -      NuStep   Level  - 3 3 3     SPM  - 85 90 100    Minutes  - 15 15 15     METs  - 2.6 2.9 3.9      Track   Laps 16 15 15 20     Minutes 15 15 15 15     METs 2.86 2.74 2.86 3.3      Home Exercise Plan   Plans to continue exercise at  -  - Home (comment)  walking Home (comment)  walking    Frequency  -  - Add 2 additional days to program exercise sessions. Add 2 additional days to program exercise sessions.    Initial Home Exercises Provided  -  - 04/16/17 04/16/17       Exercise Comments:     Exercise Comments    Row Name 03/26/17 1650 05/07/17 1021         Exercise Comments Pt was oriented to exercise equipment today. Pt responded well to first exercise session; will continue to monitor pt's progress/activity levels. Reviewed METs and goals. Pt is tolerating exercise fairly well, and responds well to exercise changes. WIll continue to monitor pt's progress and activity levels.          Exercise Goals and Review:     Exercise Goals    Row Name 03/18/17 1444             Exercise Goals   Increase Physical Activity Yes       Intervention Provide advice, education, support and counseling about physical activity/exercise needs.;Develop an individualized exercise prescription  for aerobic and resistive training based on initial evaluation findings, risk stratification, comorbidities and participant's personal goals.       Expected Outcomes Achievement of increased cardiorespiratory fitness and enhanced flexibility, muscular endurance and strength shown through measurements of functional capacity and personal statement of participant.       Increase Strength and Stamina Yes       Intervention Provide advice, education, support and counseling about physical activity/exercise needs.;Develop an individualized exercise prescription for aerobic and resistive training based on initial evaluation findings, risk stratification, comorbidities and participant's  personal goals.       Expected Outcomes Achievement of increased cardiorespiratory fitness and enhanced flexibility, muscular endurance and strength shown through measurements of functional capacity and personal statement of participant.          Exercise Goals Re-Evaluation :     Exercise Goals Re-Evaluation    Row Name 04/08/17 1502 04/16/17 1553 05/07/17 1022         Exercise Goal Re-Evaluation   Exercise Goals Review Increase Physical Activity;Able to understand and use rate of perceived exertion (RPE) scale;Knowledge and understanding of Target Heart Rate Range (THRR);Understanding of Exercise Prescription;Increase Strength and Stamina;Able to check pulse independently Increase Physical Activity;Able to understand and use rate of perceived exertion (RPE) scale;Knowledge and understanding of Target Heart Rate Range (THRR);Understanding of Exercise Prescription;Able to check pulse independently;Increase Strength and Stamina Increase Physical Activity;Able to understand and use rate of perceived exertion (RPE) scale;Knowledge and understanding of Target Heart Rate Range (THRR);Understanding of Exercise Prescription;Able to check pulse independently;Increase Strength and Stamina     Comments Pt is exercising for 20-30 minutes in cardiac rehab. Expressed to pt the importance of 30 minutes of aerobic activity for heart benefits. Reviewed home exercise with pt today.  Pt plans to walk for exercise, 2x/week in addition to coming to cardiac rehab.  Reviewed THR, pulse, RPE, sign and symptoms, NTG use, and when to call 911 or MD.  Also discussed weather considerations and indoor options.  Pt voiced understanding. Pt has improved in walking tolerance and recently completed 20 laps around the track in 15 minutes.      Expected Outcomes Pt will exercise independently and safely and improve on cardiorespiratory fitness Pt will be compliant with HEP and imrove in cardiorespiratory fitness. Pt will continue to  improve in walking tolerance, exercise capacity and functional fitness.          Discharge Exercise Prescription (Final Exercise Prescription Changes):     Exercise Prescription Changes - 04/28/17 1017      Response to Exercise   Blood Pressure (Admit) 120/70   Blood Pressure (Exercise) 140/70   Blood Pressure (Exit) 102/78   Heart Rate (Admit) 78 bpm   Heart Rate (Exercise) 96 bpm   Heart Rate (Exit) 78 bpm   Rating of Perceived Exertion (Exercise) 12   Symptoms none   Duration Continue with 30 min of aerobic exercise without signs/symptoms of physical distress.   Intensity THRR unchanged     Progression   Progression Continue to progress workloads to maintain intensity without signs/symptoms of physical distress.   Average METs 3.6     Resistance Training   Training Prescription Yes   Weight 4lbs   Reps 10-15   Time 10 Minutes     NuStep   Level 3   SPM 100   Minutes 15   METs 3.9     Track   Laps 20   Minutes 15   METs 3.3  Home Exercise Plan   Plans to continue exercise at Home (comment)  walking   Frequency Add 2 additional days to program exercise sessions.   Initial Home Exercises Provided 04/16/17      Nutrition:  Target Goals: Understanding of nutrition guidelines, daily intake of sodium 1500mg , cholesterol 200mg , calories 30% from fat and 7% or less from saturated fats, daily to have 5 or more servings of fruits and vegetables.  Biometrics:     Pre Biometrics - 03/18/17 1030      Pre Biometrics   Height 5\' 11"  (1.803 m)   Weight 225 lb 15.5 oz (102.5 kg)   Waist Circumference 42 inches   Hip Circumference 44.5 inches   Waist to Hip Ratio 0.94 %   BMI (Calculated) 31.53   Triceps Skinfold 21 mm   % Body Fat 31 %   Grip Strength 46.5 kg   Flexibility 0 in   Single Leg Stand 10.78 seconds       Nutrition Therapy Plan and Nutrition Goals:     Nutrition Therapy & Goals - 04/28/17 1418      Nutrition Therapy   Diet Therapeutic  Lifestyle Changes     Personal Nutrition Goals   Nutrition Goal Pt to identify and limit food sources of saturated fat, trans fat, and sodium   Personal Goal #2 Pt to identify food quantities necessary to achieve weight loss of 6-15 lb at graduation from cardiac rehab.      Intervention Plan   Intervention Prescribe, educate and counsel regarding individualized specific dietary modifications aiming towards targeted core components such as weight, hypertension, lipid management, diabetes, heart failure and other comorbidities.   Expected Outcomes Short Term Goal: Understand basic principles of dietary content, such as calories, fat, sodium, cholesterol and nutrients.;Long Term Goal: Adherence to prescribed nutrition plan.      Nutrition Discharge: Nutrition Scores:     Nutrition Assessments - 04/28/17 1418      MEDFICTS Scores   Pre Score 58      Nutrition Goals Re-Evaluation:   Nutrition Goals Re-Evaluation:   Nutrition Goals Discharge (Final Nutrition Goals Re-Evaluation):   Psychosocial: Target Goals: Acknowledge presence or absence of significant depression and/or stress, maximize coping skills, provide positive support system. Participant is able to verbalize types and ability to use techniques and skills needed for reducing stress and depression.  Initial Review & Psychosocial Screening:     Initial Psych Review & Screening - 03/18/17 1117      Initial Review   Current issues with None Identified     Family Dynamics   Good Support System? Yes  spouse    Comments upon brief assessment, no psychosocial needs identified, no interventions necessary      Barriers   Psychosocial barriers to participate in program There are no identifiable barriers or psychosocial needs.     Screening Interventions   Interventions Encouraged to exercise      Quality of Life Scores:     Quality of Life - 04/16/17 1526      Quality of Life Scores   Health/Function Pre 22.7 %    Socioeconomic Pre 22.06 %   Psych/Spiritual Pre 20.79 %   Family Pre 23.7 %   GLOBAL Pre 22.3 %  scores reviewed with pt.  pt denies concerns.  pt offered emotional support and reassurance.        PHQ-9: Recent Review Flowsheet Data    Depression screen [facility] 2/9 03/26/2017 03/26/2017 02/03/2017 10/07/2016 08/07/2015  Decreased Interest 0 0 0 0 0   Down, Depressed, Hopeless 0 0 0 0 0   PHQ - 2 Score 0 0 0 0 0     Interpretation of Total Score  Total Score Depression Severity:  1-4 = Minimal depression, 5-9 = Mild depression, 10-14 = Moderate depression, 15-19 = Moderately severe depression, 20-27 = Severe depression   Psychosocial Evaluation and Intervention:     Psychosocial Evaluation - 03/26/17 1445      Psychosocial Evaluation & Interventions   Interventions Encouraged to exercise with the program and follow exercise prescription   Comments no psychosocial needs identified, no interventions necessary.  pt enjoys being outdoors, yard work, [employer] work. pt also enjoys snow skiing.    Expected Outcomes pt will exhibit positive outlook with good coping skills.     Continue Psychosocial Services  No Follow up required      Psychosocial Re-Evaluation:     Psychosocial Re-Evaluation    [place] Name 04/09/17 1722 05/07/17 1628           Psychosocial Re-Evaluation   Current issues with None Identified None Identified      Comments no psychosocial needs identified, no interventions necessary  no psychosocial needs identified, no interventions necessary       Expected Outcomes pt will exhibit positive outlook with good coping skills.  pt will exhibit positive outlook with good coping skills.       Interventions Stress management education;Relaxation education;Encouraged to attend Cardiac Rehabilitation for the exercise Stress management education;Relaxation education;Encouraged to attend Cardiac Rehabilitation for the exercise      Continue Psychosocial Services  No  Follow up required No Follow up required         Psychosocial Discharge (Final Psychosocial Re-Evaluation):     Psychosocial Re-Evaluation - 05/07/17 1628      Psychosocial Re-Evaluation   Current issues with None Identified   Comments no psychosocial needs identified, no interventions necessary    Expected Outcomes pt will exhibit positive outlook with good coping skills.    Interventions Stress management education;Relaxation education;Encouraged to attend Cardiac Rehabilitation for the exercise   Continue Psychosocial Services  No Follow up required      Vocational Rehabilitation: Provide vocational rehab assistance to qualifying candidates.   Vocational Rehab Evaluation & Intervention:     Vocational Rehab - 03/18/17 1110      Initial Vocational Rehab Evaluation & Intervention   Assessment shows need for Vocational Rehabilitation No      Education: Education Goals: Education classes will be provided on a weekly basis, covering required topics. Participant will state understanding/return demonstration of topics presented.  Learning Barriers/Preferences:     Learning Barriers/Preferences - 03/18/17 1445      Learning Barriers/Preferences   Learning Barriers None   Learning Preferences Written Material;Video      Education Topics: Count Your Pulse:  -Group instruction provided by verbal instruction, demonstration, patient participation and written materials to support subject.  Instructors address importance of being able to find your pulse and how to count your pulse when at home without a heart monitor.  Patients get hands on experience counting their pulse with staff help and individually.   Heart Attack, Angina, and Risk Factor Modification:  -Group instruction provided by verbal instruction, video, and written materials to support subject.  Instructors address signs and symptoms of angina and heart attacks.    Also discuss risk factors for heart disease and  how to make changes to improve heart  health risk factors.   Functional Fitness:  -Group instruction provided by verbal instruction, demonstration, patient participation, and written materials to support subject.  Instructors address safety measures for doing things around the house.  Discuss how to get up and down off the floor, how to pick things up properly, how to safely get out of a chair without assistance, and balance training.   Meditation and Mindfulness:  -Group instruction provided by verbal instruction, patient participation, and written materials to support subject.  Instructor addresses importance of mindfulness and meditation practice to help reduce stress and improve awareness.  Instructor also leads participants through a meditation exercise.    Stretching for Flexibility and Mobility:  -Group instruction provided by verbal instruction, patient participation, and written materials to support subject.  Instructors lead participants through series of stretches that are designed to increase flexibility thus improving mobility.  These stretches are additional exercise for major muscle groups that are typically performed during regular warm up and cool down.   Hands Only CPR:  -Group verbal, video, and participation provides a basic overview of AHA guidelines for community CPR. Role-play of emergencies allow participants the opportunity to practice calling for help and chest compression technique with discussion of AED use.   Hypertension: -Group verbal and written instruction that provides a basic overview of hypertension including the most recent diagnostic guidelines, risk factor reduction with self-care instructions and medication management.    Nutrition I class: Heart Healthy Eating:  -Group instruction provided by PowerPoint slides, verbal discussion, and written materials to support subject matter. The instructor gives an explanation and review of the Therapeutic Lifestyle  Changes diet recommendations, which includes a discussion on lipid goals, dietary fat, sodium, fiber, plant stanol/sterol esters, sugar, and the components of a well-balanced, healthy diet.   CARDIAC REHAB PHASE II EXERCISE from 04/28/2017 in [place]  Date  04/28/17  Educator  RD  Instruction Review Code  Not applicable      Nutrition II class: Lifestyle Skills:  -Group instruction provided by PowerPoint slides, verbal discussion, and written materials to support subject matter. The instructor gives an explanation and review of label reading, grocery shopping for heart health, heart healthy recipe modifications, and ways to make healthier choices when eating out.   CARDIAC REHAB PHASE II EXERCISE from 04/28/2017 in [place]  Date  04/28/17  Educator  RD  Instruction Review Code  Not applicable      Diabetes Question & Answer:  -Group instruction provided by PowerPoint slides, verbal discussion, and written materials to support subject matter. The instructor gives an explanation and review of diabetes co-morbidities, pre- and post-prandial blood glucose goals, pre-exercise blood glucose goals, signs, symptoms, and treatment of hypoglycemia and hyperglycemia, and foot care basics.   Diabetes Blitz:  -Group instruction provided by PowerPoint slides, verbal discussion, and written materials to support subject matter. The instructor gives an explanation and review of the physiology behind type 1 and type 2 diabetes, diabetes medications and rational behind using different medications, pre- and post-prandial blood glucose recommendations and Hemoglobin A1c goals, diabetes diet, and exercise including blood glucose guidelines for exercising safely.    Portion Distortion:  -Group instruction provided by PowerPoint slides, verbal discussion, written materials, and food models to support subject matter. The instructor gives an  explanation of serving size versus portion size, changes in portions sizes over the last 20 years, and what consists of a serving from each food group.  Stress Management:  -Group instruction provided by verbal instruction, video, and written materials to support subject matter.  Instructors review role of stress in heart disease and how to cope with stress positively.     Exercising on Your Own:  -Group instruction provided by verbal instruction, power point, and written materials to support subject.  Instructors discuss benefits of exercise, components of exercise, frequency and intensity of exercise, and end points for exercise.  Also discuss use of nitroglycerin and activating EMS.  Review options of places to exercise outside of rehab.  Review guidelines for sex with heart disease.   Cardiac Drugs I:  -Group instruction provided by verbal instruction and written materials to support subject.  Instructor reviews cardiac drug classes: antiplatelets, anticoagulants, beta blockers, and statins.  Instructor discusses reasons, side effects, and lifestyle considerations for each drug class.   Cardiac Drugs II:  -Group instruction provided by verbal instruction and written materials to support subject.  Instructor reviews cardiac drug classes: angiotensin converting enzyme inhibitors (ACE-I), angiotensin II receptor blockers (ARBs), nitrates, and calcium channel blockers.  Instructor discusses reasons, side effects, and lifestyle considerations for each drug class.   Anatomy and Physiology of the Circulatory System:  Group verbal and written instruction and models provide basic cardiac anatomy and physiology, with the coronary electrical and arterial systems. Review of: AMI, Angina, Valve disease, Heart Failure, Peripheral Artery Disease, Cardiac Arrhythmia, Pacemakers, and the ICD.   Other Education:  -Group or individual verbal, written, or video instructions that support the educational  goals of the cardiac rehab program.   Knowledge Questionnaire Score:     Knowledge Questionnaire Score - 03/18/17 1110      Knowledge Questionnaire Score   Pre Score 23/24      Core Components/Risk Factors/Patient Goals at Admission:     Personal Goals and Risk Factors at Admission - 03/18/17 1354      Core Components/Risk Factors/Patient Goals on Admission    Weight Management Obesity;Yes   Intervention Weight Management/Obesity: Establish reasonable short term and long term weight goals.;Obesity: Provide education and appropriate resources to help participant work on and attain dietary goals.   Admit Weight 225 lb 15.5 oz (102.5 kg)   Expected Outcomes Short Term: Continue to assess and modify interventions until short term weight is achieved;Long Term: Adherence to nutrition and physical activity/exercise program aimed toward attainment of established weight goal;Weight Loss: Understanding of general recommendations for a balanced deficit meal plan, which promotes 1-2 lb weight loss per week and includes a negative energy balance of [phone] kcal/d;Understanding recommendations for meals to include 15-35% energy as protein, 25-35% energy from fat, 35-60% energy from carbohydrates, less than 200mg  of dietary cholesterol, 20-35 gm of total fiber daily;Understanding of distribution of calorie intake throughout the day with the consumption of 4-5 meals/snacks   Hypertension Yes   Intervention Provide education on lifestyle modifcations including regular physical activity/exercise, weight management, moderate sodium restriction and increased consumption of fresh fruit, vegetables, and low fat dairy, alcohol moderation, and smoking cessation.;Monitor prescription use compliance.   Expected Outcomes Short Term: Continued assessment and intervention until BP is < 140/60mm HG in hypertensive participants. < 130/44mm HG in hypertensive participants with diabetes, heart failure or chronic kidney  disease.;Long Term: Maintenance of blood pressure at goal levels.   Lipids Yes   Intervention Provide education and support for participant on nutrition & aerobic/resistive exercise along with prescribed medications to achieve LDL 70mg , HDL >40mg .   Expected Outcomes Short Term: Participant states understanding of  desired cholesterol values and is compliant with medications prescribed. Participant is following exercise prescription and nutrition guidelines.;Long Term: Cholesterol controlled with medications as prescribed, with individualized exercise RX and with personalized nutrition plan. Value goals: LDL < 70mg , HDL > 40 mg.   Personal Goal Other Yes   Personal Goal pt desires decreased dyspnea and weight loss.  pt personal weight loss goal is 200-205lb.     Intervention offer exercise and nutritional education to facilitiate weight loss and dyspnea goals.    Expected Outcomes pt will participate in aerobic exercise and nutritional recommendations to facilitate weight loss and dyspnea goals.       Core Components/Risk Factors/Patient Goals Review:      Goals and Risk Factor Review    Row Name 04/09/17 1718 05/07/17 1628           Core Components/Risk Factors/Patient Goals Review   Personal Goals Review Weight Management/Obesity;Other;Hypertension;Lipids;Develop more efficient breathing techniques such as purse lipped breathing and diaphragmatic breathing and practicing self-pacing with activity. Weight Management/Obesity;Other;Hypertension;Lipids;Develop more efficient breathing techniques such as purse lipped breathing and diaphragmatic breathing and practicing self-pacing with activity.      Review pt with CAD RF demonstrates inconsistent attendance in beginning CR sessions.  pt encouraged to participate in CR exercise, nutrition and educaiton opportunities to reduce symptoms, RF and improved health benefit.  pt with CAD RF demonstrates inconsistent attendance in beginning CR sessions.   pt encouraged to participate in CR exercise, nutrition and educaiton opportunities to reduce symptoms, RF and improved health benefit.       Expected Outcomes pt will participate in CR exercise, nutrition and lifestyle modifications to reduce overall RF, improve symptoms and health benefits.  pt will participate in CR exercise, nutrition and lifestyle modifications to reduce overall RF, improve symptoms and health benefits.          Core Components/Risk Factors/Patient Goals at Discharge (Final Review):      Goals and Risk Factor Review - 05/07/17 1628      Core Components/Risk Factors/Patient Goals Review   Personal Goals Review Weight Management/Obesity;Other;Hypertension;Lipids;Develop more efficient breathing techniques such as purse lipped breathing and diaphragmatic breathing and practicing self-pacing with activity.   Review pt with CAD RF demonstrates inconsistent attendance in beginning CR sessions.  pt encouraged to participate in CR exercise, nutrition and educaiton opportunities to reduce symptoms, RF and improved health benefit.    Expected Outcomes pt will participate in CR exercise, nutrition and lifestyle modifications to reduce overall RF, improve symptoms and health benefits.       ITP Comments:     ITP Comments    Row Name 03/18/17 0920 04/09/17 1717 05/07/17 1627       ITP Comments Dr. Fransico Him, Medical Director  Dr. Fransico Him, Medical Director  30 day ITP review. pt with good participation. sporadic attendance for personal reasons.         Comments:

## 2017-05-12 ENCOUNTER — Encounter (HOSPITAL_COMMUNITY): Payer: Medicare HMO

## 2017-05-14 ENCOUNTER — Encounter (HOSPITAL_COMMUNITY): Payer: Medicare HMO

## 2017-05-19 ENCOUNTER — Encounter (HOSPITAL_COMMUNITY)
Admission: RE | Admit: 2017-05-19 | Discharge: 2017-05-19 | Disposition: A | Discharge status: Home / Self Care | Payer: Medicare HMO | Source: Ambulatory Visit | Attending: Internal Medicine | Admitting: Internal Medicine

## 2017-05-19 DIAGNOSIS — Z955 Presence of coronary angioplasty implant and graft: Secondary | ICD-10-CM | POA: Diagnosis not present

## 2017-05-19 DIAGNOSIS — Z48812 Encounter for surgical aftercare following surgery on the circulatory system: Secondary | ICD-10-CM | POA: Diagnosis not present

## 2017-05-21 ENCOUNTER — Encounter (HOSPITAL_COMMUNITY)
Admission: RE | Admit: 2017-05-21 | Discharge: 2017-05-21 | Disposition: A | Discharge status: Home / Self Care | Payer: Medicare HMO | Source: Ambulatory Visit | Attending: Internal Medicine | Admitting: Internal Medicine

## 2017-05-21 DIAGNOSIS — Z955 Presence of coronary angioplasty implant and graft: Secondary | ICD-10-CM | POA: Diagnosis not present

## 2017-05-21 DIAGNOSIS — Z48812 Encounter for surgical aftercare following surgery on the circulatory system: Secondary | ICD-10-CM | POA: Diagnosis not present

## 2017-05-26 ENCOUNTER — Encounter (HOSPITAL_COMMUNITY)
Admission: RE | Admit: 2017-05-26 | Discharge: 2017-05-26 | Disposition: A | Discharge status: Home / Self Care | Payer: Medicare HMO | Source: Ambulatory Visit | Attending: Internal Medicine | Admitting: Internal Medicine

## 2017-05-26 DIAGNOSIS — Z955 Presence of coronary angioplasty implant and graft: Secondary | ICD-10-CM | POA: Diagnosis not present

## 2017-05-26 DIAGNOSIS — Z48812 Encounter for surgical aftercare following surgery on the circulatory system: Secondary | ICD-10-CM | POA: Diagnosis not present

## 2017-05-28 ENCOUNTER — Encounter (HOSPITAL_COMMUNITY)
Admission: RE | Admit: 2017-05-28 | Discharge: 2017-05-28 | Disposition: A | Discharge status: Home / Self Care | Payer: Medicare HMO | Source: Ambulatory Visit | Attending: Internal Medicine | Admitting: Internal Medicine

## 2017-05-28 DIAGNOSIS — Z955 Presence of coronary angioplasty implant and graft: Secondary | ICD-10-CM | POA: Diagnosis not present

## 2017-05-28 DIAGNOSIS — Z48812 Encounter for surgical aftercare following surgery on the circulatory system: Secondary | ICD-10-CM | POA: Diagnosis not present

## 2017-05-30 NOTE — Progress Notes (Signed)
Cardiac Individual Treatment Plan  Patient Details  Name: DEANGELO BERNS MRN: [id number] Date of Birth: 08-02-1945 Referring Provider:     CARDIAC REHAB PHASE II ORIENTATION from 03/18/2017 in [facility]  Referring Provider  Lyman Bishop, MD.      Initial Encounter Date:    CARDIAC REHAB PHASE II ORIENTATION from 03/18/2017 in [facility]  Date  03/18/17  Referring Provider  Lyman Bishop, MD.      Visit Diagnosis: Stented coronary artery  Patient's Home Medications on Admission:  Current Outpatient Prescriptions:  .  aspirin 81 MG chewable tablet, Chew 1 tablet (81 mg total) by mouth daily., Disp: 30 tablet, Rfl: 0 .  atorvastatin (LIPITOR) 40 MG tablet, Take 1 tablet (40 mg total) by mouth daily., Disp: 90 tablet, Rfl: 1 .  bisoprolol-hydrochlorothiazide (ZIAC) 2.5-6.25 MG tablet, Take 1 tablet by mouth daily., Disp: 90 tablet, Rfl: 3 .  clopidogrel (PLAVIX) 75 MG tablet, Take 1 tablet (75 mg total) by mouth daily with breakfast., Disp: 90 tablet, Rfl: 2 .  clotrimazole-betamethasone (LOTRISONE) cream, Apply 1 application topically 2 (two) times daily as needed. psoriass, Disp: , Rfl:  .  [employer] OMEGA-3 KRILL OIL 300 MG CAPS, Take 1 tablet by mouth every other day., Disp: , Rfl:  .  desonide (DESONATE) 0.05 % gel, Apply 1 application topically 2 (two) times daily as needed. Skin lesions, Disp: , Rfl:  .  fluocinolone (VANOS) 0.01 % cream, Apply 1 application topically daily as needed. , Disp: , Rfl:  .  hydrocortisone (ANUSOL-HC) 2.5 % rectal cream, Place 1 application rectally 2 (two) times daily as needed for hemorrhoids or anal itching., Disp: , Rfl:  .  nitroGLYCERIN (NITROSTAT) 0.4 MG SL tablet, Place 1 tablet (0.4 mg total) under the tongue every 5 (five) minutes as needed., Disp: 25 tablet, Rfl: 3 .  pantoprazole (PROTONIX) 40 MG tablet, Take 1 tablet (40 mg total) by mouth daily., Disp: 30 tablet, Rfl: 11 .   PARoxetine (PAXIL) 30 MG tablet, Take 60 mg by mouth daily. , Disp: , Rfl: 1 .  Probiotic Product (PROBIOTIC DAILY PO), Take by mouth every other day. , Disp: , Rfl:  .  rosuvastatin (CRESTOR) 40 MG tablet, Take 1 tablet (40 mg total) by mouth daily., Disp: 90 tablet, Rfl: 3 .  thiamine (VITAMIN B-1) 100 MG tablet, Take 100 mg by mouth daily., Disp: , Rfl:   Past Medical History: Past Medical History:  Diagnosis Date  . Anal fissure 08/17/2012   Really more gluteal crease irritation- protosol hc 2.5% prn   . BACK PAIN, CHRONIC 02/10/2007  . CAD (coronary artery disease), native coronary artery    02/24/17-PCI/DES to mLcx, normal EF.  . Diverticulosis of colon    2007  . GERD 02/23/2007  . GERD (gastroesophageal reflux disease)   . Hyperlipidemia   . Hypertension   . OCD (obsessive compulsive disorder) 01/23/2016   Ocd/hoarding issues- psychiatristis- [place] started on zoloft 100mg --> paxil  . Personal history of colonic polyps 03/14/2014   08/2005. Benign. 10 year repeat. 2010 colonoscopy in record was entered erroneously.    [name] PSORIASIS 02/10/2007   Topical therapy.     . Scalp psoriasis   . SQUAMOUS CELL CARCINOMA OF SKIN SITE UNSPECIFIED 08/24/2010   Annotation: face     Tobacco Use: History  Smoking Status  . Never Smoker  Smokeless Tobacco  . Never Used    Labs: Recent Review Flowsheet Data  Labs for ITP Cardiac and Pulmonary Rehab Latest Ref Rng & Units 07/12/2013 03/07/2014 08/04/2015 02/27/2017 04/28/2017   Cholestrol 0 - 200 mg/dL 208(H) 190 216(H) 194 162   LDLCALC 0 - 99 mg/dL - 117(H) 131(H) 108(H) 103(H)   LDLDIRECT mg/dL 145.3 - - - -   HDL >39.00 mg/dL 59.80 58.90 57.70 57.60 39.30   Trlycerides 0.0 - 149.0 mg/dL 68.0 71.0 136.0 142.0 96.0      Capillary Blood Glucose: No results found for: GLUCAP   Exercise Target Goals:    Exercise Program Goal: Individual exercise prescription set with THRR, safety & activity barriers. Participant demonstrates ability to  understand and report RPE using BORG scale, to self-measure pulse accurately, and to acknowledge the importance of the exercise prescription.  Exercise Prescription Goal: Starting with aerobic activity 30 plus minutes a day, 3 days per week for initial exercise prescription. Provide home exercise prescription and guidelines that participant acknowledges understanding prior to discharge.  Activity Barriers & Risk Stratification:     Activity Barriers & Cardiac Risk Stratification - 03/18/17 1442      Activity Barriers & Cardiac Risk Stratification   Activity Barriers History of Falls;Other (comment)   Comments Neuropathy- bilateral feet      6 Minute Walk:     6 Minute Walk    Row Name 03/18/17 1441 05/26/17 1522 05/27/17 0839     6 Minute Walk   Phase Initial Discharge  -   Distance 1484 feet 1574 feet  -   Distance % Change  - 6.06 %  -   Distance Feet Change  -  - 90 ft   Walk Time 6 minutes 6 minutes  -   # of Rest Breaks 0 0  -   MPH 2.81 2.98  -   METS 2.82 3.04  -   RPE 12 11  -   VO2 Peak 9.85 10.64  -   Symptoms No No  -   Resting HR 88 bpm 76 bpm  -   Resting BP 104/62 104/68  -   Max Ex. HR 90 bpm 105 bpm  -   Max Ex. BP 106/64 102/62  -   2 Minute Post BP 100/62  -  -      Oxygen Initial Assessment:   Oxygen Re-Evaluation:   Oxygen Discharge (Final Oxygen Re-Evaluation):   Initial Exercise Prescription:     Initial Exercise Prescription - 03/18/17 1400      Date of Initial Exercise RX and Referring Provider   Date 03/18/17   Referring Provider Lyman Bishop, MD.     Bike   Level 1   Minutes 10   METs 2.84     NuStep   Level 3   SPM 85   Minutes 10   METs 2.8     Track   Laps 11   Minutes 10   METs 2.92     Prescription Details   Frequency (times per week) 3   Duration Progress to 30 minutes of continuous aerobic without signs/symptoms of physical distress     Intensity   THRR 40-80% of Max Heartrate 60-121   Ratings of  Perceived Exertion 11-13   Perceived Dyspnea 0-4     Progression   Progression Continue to progress workloads to maintain intensity without signs/symptoms of physical distress.     Resistance Training   Training Prescription Yes   Weight 4lb   Reps 10-15      Perform Capillary Blood Glucose  checks as needed.  Exercise Prescription Changes:     Exercise Prescription Changes    Row Name 03/26/17 1600 04/07/17 1700 04/21/17 1019 04/28/17 1017 05/19/17 1700     Response to Exercise   Blood Pressure (Admit) 98/64 102/70 114/65 120/70 120/78   Blood Pressure (Exercise) 134/76 100/60 120/82 140/70 124/68   Blood Pressure (Exit) 98/60 128/80 100/62 102/78 116/64   Heart Rate (Admit) 88 bpm 73 bpm 67 bpm 78 bpm 83 bpm   Heart Rate (Exercise) 99 bpm 98 bpm 93 bpm 96 bpm 97 bpm   Heart Rate (Exit) 71 bpm 68 bpm 62 bpm 78 bpm 74 bpm   Rating of Perceived Exertion (Exercise) 12 12 12 12 12    Symptoms nonr none none none none   Comments pt was oriented to exercise equipment today pt was oriented to exercise equipment today  -  -  -   Duration Continue with 30 min of aerobic exercise without signs/symptoms of physical distress. Continue with 30 min of aerobic exercise without signs/symptoms of physical distress. Continue with 30 min of aerobic exercise without signs/symptoms of physical distress. Continue with 30 min of aerobic exercise without signs/symptoms of physical distress. Continue with 30 min of aerobic exercise without signs/symptoms of physical distress.   Intensity THRR unchanged THRR unchanged THRR unchanged THRR unchanged THRR unchanged     Progression   Progression Continue to progress workloads to maintain intensity without signs/symptoms of physical distress. Continue to progress workloads to maintain intensity without signs/symptoms of physical distress. Continue to progress workloads to maintain intensity without signs/symptoms of physical distress. Continue to progress  workloads to maintain intensity without signs/symptoms of physical distress. Continue to progress workloads to maintain intensity without signs/symptoms of physical distress.   Average METs 2.8 2.7 2.9 3.6 3     Resistance Training   Training Prescription Yes Yes Yes Yes Yes   Weight 2lbs 2lbs 3lbs 4lbs 4lbs   Reps 10-15 10-15 10-15 10-15 10-15   Time 10 Minutes 10 Minutes 10 Minutes 10 Minutes 10 Minutes     Bike   Level 1  -  -  -  -   Minutes 15  -  -  -  -   METs 2.84  -  -  -  -     NuStep   Level  - 3 3 3 5    SPM  - 85 90 100 90   Minutes  - 15 15 15 15    METs  - 2.6 2.9 3.9 3.1     Track   Laps 16 15 15 20 11    Minutes 15 15 15 15 15    METs 2.86 2.74 2.86 3.3 2.91     Home Exercise Plan   Plans to continue exercise at  -  - Home (comment)  walking Home (comment)  walking Home (comment)  walking   Frequency  -  - Add 2 additional days to program exercise sessions. Add 2 additional days to program exercise sessions. Add 2 additional days to program exercise sessions.   Initial Home Exercises Provided  -  - 04/16/17 04/16/17 04/16/17      Exercise Comments:     Exercise Comments    Row Name 03/26/17 1650 05/07/17 1021 05/28/17 1128       Exercise Comments Pt was oriented to exercise equipment today. Pt responded well to first exercise session; will continue to monitor pt's progress/activity levels. Reviewed METs and goals. Pt is tolerating exercise fairly well,  and responds well to exercise changes. WIll continue to monitor pt's progress and activity levels.  Reviewed METs and goals. Pt is tolerating exercise fairly well, and responds well to exercise changes. WIll continue to monitor pt's progress and activity levels.         Exercise Goals and Review:     Exercise Goals    Row Name 03/18/17 1444             Exercise Goals   Increase Physical Activity Yes       Intervention Provide advice, education, support and counseling about physical  activity/exercise needs.;Develop an individualized exercise prescription for aerobic and resistive training based on initial evaluation findings, risk stratification, comorbidities and participant's personal goals.       Expected Outcomes Achievement of increased cardiorespiratory fitness and enhanced flexibility, muscular endurance and strength shown through measurements of functional capacity and personal statement of participant.       Increase Strength and Stamina Yes       Intervention Provide advice, education, support and counseling about physical activity/exercise needs.;Develop an individualized exercise prescription for aerobic and resistive training based on initial evaluation findings, risk stratification, comorbidities and participant's personal goals.       Expected Outcomes Achievement of increased cardiorespiratory fitness and enhanced flexibility, muscular endurance and strength shown through measurements of functional capacity and personal statement of participant.          Exercise Goals Re-Evaluation :     Exercise Goals Re-Evaluation    Row Name 04/08/17 1502 04/16/17 1553 05/07/17 1022 05/28/17 1127       Exercise Goal Re-Evaluation   Exercise Goals Review Increase Physical Activity;Able to understand and use rate of perceived exertion (RPE) scale;Knowledge and understanding of Target Heart Rate Range (THRR);Understanding of Exercise Prescription;Increase Strength and Stamina;Able to check pulse independently Increase Physical Activity;Able to understand and use rate of perceived exertion (RPE) scale;Knowledge and understanding of Target Heart Rate Range (THRR);Understanding of Exercise Prescription;Able to check pulse independently;Increase Strength and Stamina Increase Physical Activity;Able to understand and use rate of perceived exertion (RPE) scale;Knowledge and understanding of Target Heart Rate Range (THRR);Understanding of Exercise Prescription;Able to check pulse  independently;Increase Strength and Stamina Increase Physical Activity;Able to understand and use rate of perceived exertion (RPE) scale;Knowledge and understanding of Target Heart Rate Range (THRR);Understanding of Exercise Prescription;Able to check pulse independently;Increase Strength and Stamina    Comments Pt is exercising for 20-30 minutes in cardiac rehab. Expressed to pt the importance of 30 minutes of aerobic activity for heart benefits. Reviewed home exercise with pt today.  Pt plans to walk for exercise, 2x/week in addition to coming to cardiac rehab.  Reviewed THR, pulse, RPE, sign and symptoms, NTG use, and when to call 911 or MD.  Also discussed weather considerations and indoor options.  Pt voiced understanding. Pt has improved in walking tolerance and recently completed 20 laps around the track in 15 minutes.  Pt is not very compliant with HEP, encouraged the importance of 150 minutes a week of cardiovascular activity to improve heart health. Pt voiced understanding.     Expected Outcomes Pt will exercise independently and safely and improve on cardiorespiratory fitness Pt will be compliant with HEP and imrove in cardiorespiratory fitness. Pt will continue to improve in walking tolerance, exercise capacity and functional fitness.  Pt will get back on track with HEP and improve in cardiorespiratory fitness.         Discharge Exercise Prescription (Final Exercise Prescription Changes):  Exercise Prescription Changes - 05/19/17 1700      Response to Exercise   Blood Pressure (Admit) 120/78   Blood Pressure (Exercise) 124/68   Blood Pressure (Exit) 116/64   Heart Rate (Admit) 83 bpm   Heart Rate (Exercise) 97 bpm   Heart Rate (Exit) 74 bpm   Rating of Perceived Exertion (Exercise) 12   Symptoms none   Duration Continue with 30 min of aerobic exercise without signs/symptoms of physical distress.   Intensity THRR unchanged     Progression   Progression Continue to progress  workloads to maintain intensity without signs/symptoms of physical distress.   Average METs 3     Resistance Training   Training Prescription Yes   Weight 4lbs   Reps 10-15   Time 10 Minutes     NuStep   Level 5   SPM 90   Minutes 15   METs 3.1     Track   Laps 11   Minutes 15   METs 2.91     Home Exercise Plan   Plans to continue exercise at Home (comment)  walking   Frequency Add 2 additional days to program exercise sessions.   Initial Home Exercises Provided 04/16/17      Nutrition:  Target Goals: Understanding of nutrition guidelines, daily intake of sodium 1500mg , cholesterol 200mg , calories 30% from fat and 7% or less from saturated fats, daily to have 5 or more servings of fruits and vegetables.  Biometrics:     Pre Biometrics - 03/18/17 1030      Pre Biometrics   Height 5\' 11"  (1.803 m)   Weight 225 lb 15.5 oz (102.5 kg)   Waist Circumference 42 inches   Hip Circumference 44.5 inches   Waist to Hip Ratio 0.94 %   BMI (Calculated) 31.53   Triceps Skinfold 21 mm   % Body Fat 31 %   Grip Strength 46.5 kg   Flexibility 0 in   Single Leg Stand 10.78 seconds         Post Biometrics - 05/26/17 1522       Post  Biometrics   Waist Circumference 45 inches   Hip Circumference 45 inches   Waist to Hip Ratio 1 %   Triceps Skinfold 21 mm   Grip Strength 45.5 kg   Flexibility 8 in   Single Leg Stand 9.21 seconds      Nutrition Therapy Plan and Nutrition Goals:     Nutrition Therapy & Goals - 04/28/17 1418      Nutrition Therapy   Diet Therapeutic Lifestyle Changes     Personal Nutrition Goals   Nutrition Goal Pt to identify and limit food sources of saturated fat, trans fat, and sodium   Personal Goal #2 Pt to identify food quantities necessary to achieve weight loss of 6-15 lb at graduation from cardiac rehab.      Intervention Plan   Intervention Prescribe, educate and counsel regarding individualized specific dietary modifications aiming  towards targeted core components such as weight, hypertension, lipid management, diabetes, heart failure and other comorbidities.   Expected Outcomes Short Term Goal: Understand basic principles of dietary content, such as calories, fat, sodium, cholesterol and nutrients.;Long Term Goal: Adherence to prescribed nutrition plan.      Nutrition Discharge: Nutrition Scores:     Nutrition Assessments - 04/28/17 1418      MEDFICTS Scores   Pre Score 58      Nutrition Goals Re-Evaluation:   Nutrition Goals Re-Evaluation:  Nutrition Goals Discharge (Final Nutrition Goals Re-Evaluation):   Psychosocial: Target Goals: Acknowledge presence or absence of significant depression and/or stress, maximize coping skills, provide positive support system. Participant is able to verbalize types and ability to use techniques and skills needed for reducing stress and depression.  Initial Review & Psychosocial Screening:     Initial Psych Review & Screening - 03/18/17 1117      Initial Review   Current issues with None Identified     Family Dynamics   Good Support System? Yes  spouse    Comments upon brief assessment, no psychosocial needs identified, no interventions necessary      Barriers   Psychosocial barriers to participate in program There are no identifiable barriers or psychosocial needs.     Screening Interventions   Interventions Encouraged to exercise      Quality of Life Scores:     Quality of Life - 04/16/17 1526      Quality of Life Scores   Health/Function Pre 22.7 %   Socioeconomic Pre 22.06 %   Psych/Spiritual Pre 20.79 %   Family Pre 23.7 %   GLOBAL Pre 22.3 %  scores reviewed with pt.  pt denies concerns.  pt offered emotional support and reassurance.        PHQ-9: Recent Review Flowsheet Data    Depression screen [facility] 2/9 03/26/2017 03/26/2017 02/03/2017 10/07/2016 08/07/2015   Decreased Interest 0 0 0 0 0   Down, Depressed, Hopeless 0 0 0 0 0   PHQ - 2 Score  0 0 0 0 0     Interpretation of Total Score  Total Score Depression Severity:  1-4 = Minimal depression, 5-9 = Mild depression, 10-14 = Moderate depression, 15-19 = Moderately severe depression, 20-27 = Severe depression   Psychosocial Evaluation and Intervention:     Psychosocial Evaluation - 03/26/17 1445      Psychosocial Evaluation & Interventions   Interventions Encouraged to exercise with the program and follow exercise prescription   Comments no psychosocial needs identified, no interventions necessary.  pt enjoys being outdoors, yard work, [employer] work. pt also enjoys snow skiing.    Expected Outcomes pt will exhibit positive outlook with good coping skills.     Continue Psychosocial Services  No Follow up required      Psychosocial Re-Evaluation:     Psychosocial Re-Evaluation    [place] Name 04/09/17 1722 05/07/17 1628 05/30/17 1213         Psychosocial Re-Evaluation   Current issues with None Identified None Identified None Identified     Comments no psychosocial needs identified, no interventions necessary  no psychosocial needs identified, no interventions necessary  no psychosocial needs identified, no interventions necessary      Expected Outcomes pt will exhibit positive outlook with good coping skills.  pt will exhibit positive outlook with good coping skills.  pt will exhibit positive outlook with good coping skills.      Interventions Stress management education;Relaxation education;Encouraged to attend Cardiac Rehabilitation for the exercise Stress management education;Relaxation education;Encouraged to attend Cardiac Rehabilitation for the exercise Stress management education;Relaxation education;Encouraged to attend Cardiac Rehabilitation for the exercise     Continue Psychosocial Services  No Follow up required No Follow up required No Follow up required        Psychosocial Discharge (Final Psychosocial Re-Evaluation):     Psychosocial  Re-Evaluation - 05/30/17 1213      Psychosocial Re-Evaluation   Current issues with None Identified  Comments no psychosocial needs identified, no interventions necessary    Expected Outcomes pt will exhibit positive outlook with good coping skills.    Interventions Stress management education;Relaxation education;Encouraged to attend Cardiac Rehabilitation for the exercise   Continue Psychosocial Services  No Follow up required      Vocational Rehabilitation: Provide vocational rehab assistance to qualifying candidates.   Vocational Rehab Evaluation & Intervention:     Vocational Rehab - 03/18/17 1110      Initial Vocational Rehab Evaluation & Intervention   Assessment shows need for Vocational Rehabilitation No      Education: Education Goals: Education classes will be provided on a weekly basis, covering required topics. Participant will state understanding/return demonstration of topics presented.  Learning Barriers/Preferences:     Learning Barriers/Preferences - 03/18/17 1445      Learning Barriers/Preferences   Learning Barriers None   Learning Preferences Written Material;Video      Education Topics: Count Your Pulse:  -Group instruction provided by verbal instruction, demonstration, patient participation and written materials to support subject.  Instructors address importance of being able to find your pulse and how to count your pulse when at home without a heart monitor.  Patients get hands on experience counting their pulse with staff help and individually.   Heart Attack, Angina, and Risk Factor Modification:  -Group instruction provided by verbal instruction, video, and written materials to support subject.  Instructors address signs and symptoms of angina and heart attacks.    Also discuss risk factors for heart disease and how to make changes to improve heart health risk factors.   Functional Fitness:  -Group instruction provided by verbal  instruction, demonstration, patient participation, and written materials to support subject.  Instructors address safety measures for doing things around the house.  Discuss how to get up and down off the floor, how to pick things up properly, how to safely get out of a chair without assistance, and balance training.   Meditation and Mindfulness:  -Group instruction provided by verbal instruction, patient participation, and written materials to support subject.  Instructor addresses importance of mindfulness and meditation practice to help reduce stress and improve awareness.  Instructor also leads participants through a meditation exercise.    Stretching for Flexibility and Mobility:  -Group instruction provided by verbal instruction, patient participation, and written materials to support subject.  Instructors lead participants through series of stretches that are designed to increase flexibility thus improving mobility.  These stretches are additional exercise for major muscle groups that are typically performed during regular warm up and cool down.   Hands Only CPR:  -Group verbal, video, and participation provides a basic overview of AHA guidelines for community CPR. Role-play of emergencies allow participants the opportunity to practice calling for help and chest compression technique with discussion of AED use.   Hypertension: -Group verbal and written instruction that provides a basic overview of hypertension including the most recent diagnostic guidelines, risk factor reduction with self-care instructions and medication management.    Nutrition I class: Heart Healthy Eating:  -Group instruction provided by PowerPoint slides, verbal discussion, and written materials to support subject matter. The instructor gives an explanation and review of the Therapeutic Lifestyle Changes diet recommendations, which includes a discussion on lipid goals, dietary fat, sodium, fiber, plant stanol/sterol  esters, sugar, and the components of a well-balanced, healthy diet.   CARDIAC REHAB PHASE II EXERCISE from 04/28/2017 in [facility]  Date  04/28/17  Educator  RD  Instruction Review Code  Not applicable      Nutrition II class: Lifestyle Skills:  -Group instruction provided by PowerPoint slides, verbal discussion, and written materials to support subject matter. The instructor gives an explanation and review of label reading, grocery shopping for heart health, heart healthy recipe modifications, and ways to make healthier choices when eating out.   CARDIAC REHAB PHASE II EXERCISE from 04/28/2017 in [place]  Date  04/28/17  Educator  RD  Instruction Review Code  Not applicable      Diabetes Question & Answer:  -Group instruction provided by PowerPoint slides, verbal discussion, and written materials to support subject matter. The instructor gives an explanation and review of diabetes co-morbidities, pre- and post-prandial blood glucose goals, pre-exercise blood glucose goals, signs, symptoms, and treatment of hypoglycemia and hyperglycemia, and foot care basics.   Diabetes Blitz:  -Group instruction provided by PowerPoint slides, verbal discussion, and written materials to support subject matter. The instructor gives an explanation and review of the physiology behind type 1 and type 2 diabetes, diabetes medications and rational behind using different medications, pre- and post-prandial blood glucose recommendations and Hemoglobin A1c goals, diabetes diet, and exercise including blood glucose guidelines for exercising safely.    Portion Distortion:  -Group instruction provided by PowerPoint slides, verbal discussion, written materials, and food models to support subject matter. The instructor gives an explanation of serving size versus portion size, changes in portions sizes over the last 20 years, and what consists of a serving  from each food group.   Stress Management:  -Group instruction provided by verbal instruction, video, and written materials to support subject matter.  Instructors review role of stress in heart disease and how to cope with stress positively.     Exercising on Your Own:  -Group instruction provided by verbal instruction, power point, and written materials to support subject.  Instructors discuss benefits of exercise, components of exercise, frequency and intensity of exercise, and end points for exercise.  Also discuss use of nitroglycerin and activating EMS.  Review options of places to exercise outside of rehab.  Review guidelines for sex with heart disease.   Cardiac Drugs I:  -Group instruction provided by verbal instruction and written materials to support subject.  Instructor reviews cardiac drug classes: antiplatelets, anticoagulants, beta blockers, and statins.  Instructor discusses reasons, side effects, and lifestyle considerations for each drug class.   Cardiac Drugs II:  -Group instruction provided by verbal instruction and written materials to support subject.  Instructor reviews cardiac drug classes: angiotensin converting enzyme inhibitors (ACE-I), angiotensin II receptor blockers (ARBs), nitrates, and calcium channel blockers.  Instructor discusses reasons, side effects, and lifestyle considerations for each drug class.   Anatomy and Physiology of the Circulatory System:  Group verbal and written instruction and models provide basic cardiac anatomy and physiology, with the coronary electrical and arterial systems. Review of: AMI, Angina, Valve disease, Heart Failure, Peripheral Artery Disease, Cardiac Arrhythmia, Pacemakers, and the ICD.   Other Education:  -Group or individual verbal, written, or video instructions that support the educational goals of the cardiac rehab program.   Knowledge Questionnaire Score:     Knowledge Questionnaire Score - 03/18/17 1110       Knowledge Questionnaire Score   Pre Score 23/24      Core Components/Risk Factors/Patient Goals at Admission:     Personal Goals and Risk Factors at Admission - 03/18/17 1354      Core Components/Risk  Factors/Patient Goals on Admission    Weight Management Obesity;Yes   Intervention Weight Management/Obesity: Establish reasonable short term and long term weight goals.;Obesity: Provide education and appropriate resources to help participant work on and attain dietary goals.   Admit Weight 225 lb 15.5 oz (102.5 kg)   Expected Outcomes Short Term: Continue to assess and modify interventions until short term weight is achieved;Long Term: Adherence to nutrition and physical activity/exercise program aimed toward attainment of established weight goal;Weight Loss: Understanding of general recommendations for a balanced deficit meal plan, which promotes 1-2 lb weight loss per week and includes a negative energy balance of [phone] kcal/d;Understanding recommendations for meals to include 15-35% energy as protein, 25-35% energy from fat, 35-60% energy from carbohydrates, less than 200mg  of dietary cholesterol, 20-35 gm of total fiber daily;Understanding of distribution of calorie intake throughout the day with the consumption of 4-5 meals/snacks   Hypertension Yes   Intervention Provide education on lifestyle modifcations including regular physical activity/exercise, weight management, moderate sodium restriction and increased consumption of fresh fruit, vegetables, and low fat dairy, alcohol moderation, and smoking cessation.;Monitor prescription use compliance.   Expected Outcomes Short Term: Continued assessment and intervention until BP is < 140/9mm HG in hypertensive participants. < 130/71mm HG in hypertensive participants with diabetes, heart failure or chronic kidney disease.;Long Term: Maintenance of blood pressure at goal levels.   Lipids Yes   Intervention Provide education and support for  participant on nutrition & aerobic/resistive exercise along with prescribed medications to achieve LDL 70mg , HDL >40mg .   Expected Outcomes Short Term: Participant states understanding of desired cholesterol values and is compliant with medications prescribed. Participant is following exercise prescription and nutrition guidelines.;Long Term: Cholesterol controlled with medications as prescribed, with individualized exercise RX and with personalized nutrition plan. Value goals: LDL < 70mg , HDL > 40 mg.   Personal Goal Other Yes   Personal Goal pt desires decreased dyspnea and weight loss.  pt personal weight loss goal is 200-205lb.     Intervention offer exercise and nutritional education to facilitiate weight loss and dyspnea goals.    Expected Outcomes pt will participate in aerobic exercise and nutritional recommendations to facilitate weight loss and dyspnea goals.       Core Components/Risk Factors/Patient Goals Review:      Goals and Risk Factor Review    Row Name 04/09/17 1718 05/07/17 1628 05/30/17 1212         Core Components/Risk Factors/Patient Goals Review   Personal Goals Review Weight Management/Obesity;Other;Hypertension;Lipids;Develop more efficient breathing techniques such as purse lipped breathing and diaphragmatic breathing and practicing self-pacing with activity. Weight Management/Obesity;Other;Hypertension;Lipids;Develop more efficient breathing techniques such as purse lipped breathing and diaphragmatic breathing and practicing self-pacing with activity. Weight Management/Obesity;Other;Hypertension;Lipids;Develop more efficient breathing techniques such as purse lipped breathing and diaphragmatic breathing and practicing self-pacing with activity.     Review pt with CAD RF demonstrates inconsistent attendance in beginning CR sessions.  pt encouraged to participate in CR exercise, nutrition and educaiton opportunities to reduce symptoms, RF and improved health benefit.  pt  with CAD RF demonstrates inconsistent attendance in beginning CR sessions.  pt encouraged to participate in CR exercise, nutrition and educaiton opportunities to reduce symptoms, RF and improved health benefit.  pt with CAD RF demonstrates inconsistent attendance.  pt interested in continuing program for a few more weeks.  pt notes he has decreased DOE with hills and stairs.   pt encouraged to participate in CR exercise, nutrition and educaiton opportunities to reduce symptoms, RF  and improved health benefit.      Expected Outcomes pt will participate in CR exercise, nutrition and lifestyle modifications to reduce overall RF, improve symptoms and health benefits.  pt will participate in CR exercise, nutrition and lifestyle modifications to reduce overall RF, improve symptoms and health benefits.  pt will participate in CR exercise, nutrition and lifestyle modifications to reduce overall RF, improve symptoms and health benefits.         Core Components/Risk Factors/Patient Goals at Discharge (Final Review):      Goals and Risk Factor Review - 05/30/17 1212      Core Components/Risk Factors/Patient Goals Review   Personal Goals Review Weight Management/Obesity;Other;Hypertension;Lipids;Develop more efficient breathing techniques such as purse lipped breathing and diaphragmatic breathing and practicing self-pacing with activity.   Review pt with CAD RF demonstrates inconsistent attendance.  pt interested in continuing program for a few more weeks.  pt notes he has decreased DOE with hills and stairs.   pt encouraged to participate in CR exercise, nutrition and educaiton opportunities to reduce symptoms, RF and improved health benefit.    Expected Outcomes pt will participate in CR exercise, nutrition and lifestyle modifications to reduce overall RF, improve symptoms and health benefits.       ITP Comments:     ITP Comments    Row Name 03/18/17 0920 04/09/17 1717 05/07/17 1627 05/30/17 1212      ITP Comments Dr. Fransico Him, Medical Director  Dr. Fransico Him, Medical Director  30 day ITP review. pt with good participation. sporadic attendance for personal reasons.  30 day ITP review. pt with good participation. sporadic attendance for personal reasons.        Comments:

## 2017-06-02 ENCOUNTER — Encounter (HOSPITAL_COMMUNITY): Payer: Medicare HMO

## 2017-06-04 ENCOUNTER — Encounter (HOSPITAL_COMMUNITY): Payer: Medicare HMO

## 2017-06-09 ENCOUNTER — Encounter (HOSPITAL_COMMUNITY)
Admission: RE | Admit: 2017-06-09 | Discharge: 2017-06-09 | Disposition: A | Discharge status: Home / Self Care | Payer: Medicare HMO | Source: Ambulatory Visit | Attending: Internal Medicine | Admitting: Internal Medicine

## 2017-06-09 DIAGNOSIS — Z48812 Encounter for surgical aftercare following surgery on the circulatory system: Secondary | ICD-10-CM | POA: Insufficient documentation

## 2017-06-09 DIAGNOSIS — Z955 Presence of coronary angioplasty implant and graft: Secondary | ICD-10-CM

## 2017-06-11 ENCOUNTER — Encounter (HOSPITAL_COMMUNITY)
Admission: RE | Admit: 2017-06-11 | Discharge: 2017-06-11 | Disposition: A | Discharge status: Home / Self Care | Payer: Medicare HMO | Source: Ambulatory Visit | Attending: Internal Medicine | Admitting: Internal Medicine

## 2017-06-11 DIAGNOSIS — Z955 Presence of coronary angioplasty implant and graft: Secondary | ICD-10-CM

## 2017-06-11 DIAGNOSIS — Z48812 Encounter for surgical aftercare following surgery on the circulatory system: Secondary | ICD-10-CM | POA: Diagnosis not present

## 2017-06-16 ENCOUNTER — Encounter (HOSPITAL_COMMUNITY)
Admission: RE | Admit: 2017-06-16 | Discharge: 2017-06-16 | Disposition: A | Discharge status: Home / Self Care | Payer: Medicare HMO | Source: Ambulatory Visit | Attending: Internal Medicine | Admitting: Internal Medicine

## 2017-06-16 ENCOUNTER — Encounter (HOSPITAL_COMMUNITY): Payer: Self-pay

## 2017-06-16 DIAGNOSIS — Z955 Presence of coronary angioplasty implant and graft: Secondary | ICD-10-CM | POA: Diagnosis not present

## 2017-06-16 DIAGNOSIS — Z48812 Encounter for surgical aftercare following surgery on the circulatory system: Secondary | ICD-10-CM | POA: Diagnosis not present

## 2017-06-18 ENCOUNTER — Encounter (HOSPITAL_COMMUNITY): Payer: Medicare HMO

## 2017-06-23 ENCOUNTER — Encounter (HOSPITAL_COMMUNITY): Payer: Medicare HMO

## 2017-06-25 ENCOUNTER — Encounter (HOSPITAL_COMMUNITY): Payer: Medicare HMO

## 2017-06-30 ENCOUNTER — Encounter (HOSPITAL_COMMUNITY): Payer: Medicare HMO

## 2017-06-30 DIAGNOSIS — R69 Illness, unspecified: Secondary | ICD-10-CM | POA: Diagnosis not present

## 2017-07-02 ENCOUNTER — Encounter (HOSPITAL_COMMUNITY): Payer: Medicare HMO

## 2017-07-07 ENCOUNTER — Encounter (HOSPITAL_COMMUNITY): Payer: Medicare HMO

## 2017-07-08 NOTE — Progress Notes (Signed)
Discharge Progress Report  Patient Details  Name: Charles Villa MRN: [id number] Date of Birth: March 09, 1946 Referring Provider:     [place] from 03/18/2017 in [facility]  Referring Provider  Lyman Bishop, MD.       Number of Visits: 17   Reason for Discharge:  Patient reached a stable level of exercise.  Smoking History:  Social History   Tobacco Use  Smoking Status Never Smoker  Smokeless Tobacco Never Used    Diagnosis:  Stented coronary artery  ADL [facility]:   Initial Exercise Prescription: Initial Exercise Prescription - 03/18/17 1400      Date of Initial Exercise RX and Referring Provider   Date  03/18/17    Referring Provider  Lyman Bishop, MD.      Bike   Level  1    Minutes  10    METs  2.84      NuStep   Level  3    SPM  85    Minutes  10    METs  2.8      Track   Laps  11    Minutes  10    METs  2.92      Prescription Details   Frequency (times per week)  3    Duration  Progress to 30 minutes of continuous aerobic without signs/symptoms of physical distress      Intensity   THRR 40-80% of Max Heartrate  60-121    Ratings of Perceived Exertion  11-13    Perceived Dyspnea  0-4      Progression   Progression  Continue to progress workloads to maintain intensity without signs/symptoms of physical distress.      Resistance Training   Training Prescription  Yes    Weight  4lb    Reps  10-15       Discharge Exercise Prescription (Final Exercise Prescription Changes): Exercise Prescription Changes - 06/16/17 1535      Response to Exercise   Blood Pressure (Admit)  100/60    Blood Pressure (Exercise)  126/78    Blood Pressure (Exit)  108/60    Heart Rate (Admit)  71 bpm    Heart Rate (Exercise)  103 bpm    Heart Rate (Exit)  69 bpm    Rating of Perceived Exertion (Exercise)  12    Symptoms  none    Comments  pt arrived late to exercise session    Duration  Continue with 30  min of aerobic exercise without signs/symptoms of physical distress.    Intensity  THRR unchanged      Progression   Progression  Continue to progress workloads to maintain intensity without signs/symptoms of physical distress.    Average METs  3.1      Resistance Training   Training Prescription  Yes    Weight  5lbs    Reps  10-15    Time  10 Minutes      NuStep   Level  5    SPM  90    Minutes  20    METs  3.2      Track   Laps  11    Minutes  15    METs  2.91      Home Exercise Plan   Plans to continue exercise at  Home (comment) walking    Frequency  Add 2 additional days to program exercise sessions.    Initial Home  Exercises Provided  04/16/17       Functional Capacity: 6 Minute Walk    Row Name 03/18/17 1441 05/26/17 1522 05/27/17 0839     6 Minute Walk   Phase  Initial  Discharge  -   Distance  1484 feet  1574 feet  -   Distance % Change  -  6.06 %  -   Distance Feet Change  -  -  90 ft   Walk Time  6 minutes  6 minutes  -   # of Rest Breaks  0  0  -   MPH  2.81  2.98  -   METS  2.82  3.04  -   RPE  12  11  -   VO2 Peak  9.85  10.64  -   Symptoms  No  No  -   Resting HR  88 bpm  76 bpm  -   Resting BP  104/62  104/68  -   Max Ex. HR  90 bpm  105 bpm  -   Max Ex. BP  106/64  102/62  -   2 Minute Post BP  100/62  -  -   Row Name 06/18/17 1546         6 Minute Walk   2 Minute Post BP  104/60        Psychological, QOL, Others - Outcomes: PHQ 2/9: Depression screen [facility] 2/9 06/16/2017 03/26/2017 03/26/2017 02/03/2017 10/07/2016  Decreased Interest 0 0 0 0 0  Down, Depressed, Hopeless 0 0 0 0 0  PHQ - 2 Score 0 0 0 0 0    Quality of Life: Quality of Life - 04/16/17 1526      Quality of Life Scores   Health/Function Pre  22.7 %    Socioeconomic Pre  22.06 %    Psych/Spiritual Pre  20.79 %    Family Pre  23.7 %    GLOBAL Pre  22.3 % scores reviewed with pt.  pt denies concerns.  pt offered emotional support and reassurance.         Personal  Goals: Goals established at orientation with interventions provided to work toward goal. Personal Goals and Risk Factors at Admission - 03/18/17 1354      Core Components/Risk Factors/Patient Goals on Admission    Weight Management  Obesity;Yes    Intervention  Weight Management/Obesity: Establish reasonable short term and long term weight goals.;Obesity: Provide education and appropriate resources to help participant work on and attain dietary goals.    Admit Weight  225 lb 15.5 oz (102.5 kg)    Expected Outcomes  Short Term: Continue to assess and modify interventions until short term weight is achieved;Long Term: Adherence to nutrition and physical activity/exercise program aimed toward attainment of established weight goal;Weight Loss: Understanding of general recommendations for a balanced deficit meal plan, which promotes 1-2 lb weight loss per week and includes a negative energy balance of [phone] kcal/d;Understanding recommendations for meals to include 15-35% energy as protein, 25-35% energy from fat, 35-60% energy from carbohydrates, less than 200mg  of dietary cholesterol, 20-35 gm of total fiber daily;Understanding of distribution of calorie intake throughout the day with the consumption of 4-5 meals/snacks    Hypertension  Yes    Intervention  Provide education on lifestyle modifcations including regular physical activity/exercise, weight management, moderate sodium restriction and increased consumption of fresh fruit, vegetables, and low fat dairy, alcohol moderation, and smoking cessation.;Monitor prescription use compliance.  Expected Outcomes  Short Term: Continued assessment and intervention until BP is < 140/25mm HG in hypertensive participants. < 130/36mm HG in hypertensive participants with diabetes, heart failure or chronic kidney disease.;Long Term: Maintenance of blood pressure at goal levels.    Lipids  Yes    Intervention  Provide education and support for participant on  nutrition & aerobic/resistive exercise along with prescribed medications to achieve LDL 70mg , HDL >40mg .    Expected Outcomes  Short Term: Participant states understanding of desired cholesterol values and is compliant with medications prescribed. Participant is following exercise prescription and nutrition guidelines.;Long Term: Cholesterol controlled with medications as prescribed, with individualized exercise RX and with personalized nutrition plan. Value goals: LDL < 70mg , HDL > 40 mg.    Personal Goal Other  Yes    Personal Goal  pt desires decreased dyspnea and weight loss.  pt personal weight loss goal is 200-205lb.      Intervention  offer exercise and nutritional education to facilitiate weight loss and dyspnea goals.     Expected Outcomes  pt will participate in aerobic exercise and nutritional recommendations to facilitate weight loss and dyspnea goals.         Personal Goals Discharge: Goals and Risk Factor Review    Row Name 04/09/17 1718 05/07/17 1628 05/30/17 1212 06/16/17 1615       Core Components/Risk Factors/Patient Goals Review   Personal Goals Review  Weight Management/Obesity;Other;Hypertension;Lipids;Develop more efficient breathing techniques such as purse lipped breathing and diaphragmatic breathing and practicing self-pacing with activity.  Weight Management/Obesity;Other;Hypertension;Lipids;Develop more efficient breathing techniques such as purse lipped breathing and diaphragmatic breathing and practicing self-pacing with activity.  Weight Management/Obesity;Other;Hypertension;Lipids;Develop more efficient breathing techniques such as purse lipped breathing and diaphragmatic breathing and practicing self-pacing with activity.  -    Review  pt with CAD RF demonstrates inconsistent attendance in beginning CR sessions.  pt encouraged to participate in CR exercise, nutrition and educaiton opportunities to reduce symptoms, RF and improved health benefit.   pt with CAD RF  demonstrates inconsistent attendance in beginning CR sessions.  pt encouraged to participate in CR exercise, nutrition and educaiton opportunities to reduce symptoms, RF and improved health benefit.   pt with CAD RF demonstrates inconsistent attendance.  pt interested in continuing program for a few more weeks.  pt notes he has decreased DOE with hills and stairs.   pt encouraged to participate in CR exercise, nutrition and educaiton opportunities to reduce symptoms, RF and improved health benefit.   pt completed CR program with 17 sessions.  pt plans to exercise on his own by walking and cycling.  pt feels he has increased strength/stamina especially with climbing hills and walking distances. pt also feels he has regained his confidence to exercise.  pt personal goal is to be able to go skiing with his son in [place].      Expected Outcomes  pt will participate in CR exercise, nutrition and lifestyle modifications to reduce overall RF, improve symptoms and health benefits.   pt will participate in CR exercise, nutrition and lifestyle modifications to reduce overall RF, improve symptoms and health benefits.   pt will participate in CR exercise, nutrition and lifestyle modifications to reduce overall RF, improve symptoms and health benefits.   pt will participate in  exercise, nutrition and lifestyle modifications to reduce overall RF, improve symptoms and health benefits.        Exercise Goals and Review: Exercise Goals    Row Name 03/18/17 1444  Exercise Goals   Increase Physical Activity  Yes       Intervention  Provide advice, education, support and counseling about physical activity/exercise needs.;Develop an individualized exercise prescription for aerobic and resistive training based on initial evaluation findings, risk stratification, comorbidities and participant's personal goals.       Expected Outcomes  Achievement of increased cardiorespiratory fitness and enhanced flexibility,  muscular endurance and strength shown through measurements of functional capacity and personal statement of participant.       Increase Strength and Stamina  Yes       Intervention  Provide advice, education, support and counseling about physical activity/exercise needs.;Develop an individualized exercise prescription for aerobic and resistive training based on initial evaluation findings, risk stratification, comorbidities and participant's personal goals.       Expected Outcomes  Achievement of increased cardiorespiratory fitness and enhanced flexibility, muscular endurance and strength shown through measurements of functional capacity and personal statement of participant.          Nutrition & Weight - Outcomes: Pre Biometrics - 03/18/17 1030      Pre Biometrics   Height  5\' 11"  (1.803 m)    Weight  225 lb 15.5 oz (102.5 kg)    Waist Circumference  42 inches    Hip Circumference  44.5 inches    Waist to Hip Ratio  0.94 %    BMI (Calculated)  31.53    Triceps Skinfold  21 mm    % Body Fat  31 %    Grip Strength  46.5 kg    Flexibility  0 in    Single Leg Stand  10.78 seconds      Post Biometrics - 06/18/17 1548       Post  Biometrics   % Body Fat  32.5 %       Nutrition: Nutrition Therapy & Goals - 04/28/17 1418      Nutrition Therapy   Diet  Therapeutic Lifestyle Changes      Personal Nutrition Goals   Nutrition Goal  Pt to identify and limit food sources of saturated fat, trans fat, and sodium    Personal Goal #2  Pt to identify food quantities necessary to achieve weight loss of 6-15 lb at graduation from cardiac rehab.       Intervention Plan   Intervention  Prescribe, educate and counsel regarding individualized specific dietary modifications aiming towards targeted core components such as weight, hypertension, lipid management, diabetes, heart failure and other comorbidities.    Expected Outcomes  Short Term Goal: Understand basic principles of dietary content, such  as calories, fat, sodium, cholesterol and nutrients.;Long Term Goal: Adherence to prescribed nutrition plan.       Nutrition Discharge: Nutrition Assessments - 04/28/17 1418      MEDFICTS Scores   Pre Score  58       Education Questionnaire Score: Knowledge Questionnaire Score - 03/18/17 1110      Knowledge Questionnaire Score   Pre Score  23/24       Goals reviewed with patient; copy given to patient.

## 2017-07-09 ENCOUNTER — Encounter (HOSPITAL_COMMUNITY): Payer: Medicare HMO

## 2017-07-14 ENCOUNTER — Encounter (HOSPITAL_COMMUNITY): Payer: Medicare HMO

## 2017-07-15 DIAGNOSIS — R69 Illness, unspecified: Secondary | ICD-10-CM | POA: Diagnosis not present

## 2017-07-16 ENCOUNTER — Encounter (HOSPITAL_COMMUNITY): Payer: Medicare HMO

## 2017-07-17 DIAGNOSIS — L400 Psoriasis vulgaris: Secondary | ICD-10-CM | POA: Diagnosis not present

## 2017-07-21 ENCOUNTER — Encounter (HOSPITAL_COMMUNITY): Payer: Medicare HMO

## 2017-07-24 ENCOUNTER — Other Ambulatory Visit: Payer: Self-pay

## 2017-07-24 MED ORDER — BISOPROLOL-HYDROCHLOROTHIAZIDE 2.5-6.25 MG PO TABS: 1.0000 | ORAL_TABLET | Freq: Every day | ORAL | 3 refills | Status: DC

## 2017-08-19 DIAGNOSIS — R69 Illness, unspecified: Secondary | ICD-10-CM | POA: Diagnosis not present

## 2017-08-25 DIAGNOSIS — R69 Illness, unspecified: Secondary | ICD-10-CM | POA: Diagnosis not present

## 2017-08-27 DIAGNOSIS — R69 Illness, unspecified: Secondary | ICD-10-CM | POA: Diagnosis not present

## 2017-09-09 ENCOUNTER — Ambulatory Visit: Payer: Medicare HMO | Admitting: Internal Medicine

## 2017-09-09 ENCOUNTER — Encounter: Payer: Self-pay | Admitting: Internal Medicine

## 2017-09-09 VITALS — BP 94/62 | HR 66 | Ht 71.0 in | Wt 223.0 lb

## 2017-09-09 DIAGNOSIS — I2510 Atherosclerotic heart disease of native coronary artery without angina pectoris: Secondary | ICD-10-CM | POA: Diagnosis not present

## 2017-09-09 DIAGNOSIS — R69 Illness, unspecified: Secondary | ICD-10-CM | POA: Diagnosis not present

## 2017-09-09 DIAGNOSIS — I10 Essential (primary) hypertension: Secondary | ICD-10-CM | POA: Diagnosis not present

## 2017-09-09 DIAGNOSIS — F1010 Alcohol abuse, uncomplicated: Secondary | ICD-10-CM | POA: Diagnosis not present

## 2017-09-09 DIAGNOSIS — Z9861 Coronary angioplasty status: Secondary | ICD-10-CM | POA: Diagnosis not present

## 2017-09-09 DIAGNOSIS — E7800 Pure hypercholesterolemia, unspecified: Secondary | ICD-10-CM

## 2017-09-09 NOTE — Patient Instructions (Signed)
Your physician wants you to follow-up in: 6 months with Dr. [name]. You will receive a reminder letter in the mail two months in advance. If you don't receive a letter, please call our office to schedule the follow-up appointment.    

## 2017-09-10 ENCOUNTER — Encounter: Payer: Self-pay | Admitting: Internal Medicine

## 2017-09-10 NOTE — Progress Notes (Signed)
OFFICE CONSULT NOTE  Chief Complaint:  Follow-up heart disease  Primary Care Physician: Marin Olp, MD  HPI:  Charles Villa is a 72 y.o. male who is being seen today for the evaluation of shortness of breath at the request of Marin Olp, MD. Charles Villa has a history of hypertension, dyslipidemia, GERD, chronic back pain, OCD and long-standing alcohol use, according to his wife who did most the talking during the visit he drinks about 2-3 drinks per day however could be more. She says that's increased more recently. She notes that over the past 2 years she's had progressively worsening shortness of breath. 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 They have a vacation house at [facility] and he has approximate 65 steps to climb up. He says he gets short of breath walking up the stairs and has been more difficult to do that without stopping. He seems to minimize his symptoms. His wife is concerned about possible coronary artery disease as a cause of this. She used to work in cardiac rehabilitation, possibly is an Physiological scientist. There is a family history of hypertension and stroke in his mother and his father who was a smoker died of lung cancer. He denies any chest pain or pressure, palpitations, presyncope or syncopal symptoms. He also denies any fever, chills, cough or other infective symptoms.  02/20/2017  Charles Villa returns today for follow-up. He underwent echocardiogram as well as an exercise treadmill stress test. The exercise treadmill stress test unfortunately was abnormal indicating ST segment depression in leads 23 aVF and V5 and V6 beginning at 5 minutes a stress returning to baseline after about 5-9 minutes of recovery. His echocardiogram demonstrated normal systolic function, mild diastolic dysfunction and some mild valvular abnormalities. I reviewed the results for him today along with his wife and my recommendations  for left heart catheterization.  03/17/2017  Charles Villa was seen today in follow-up. He underwent a graded exercise tolerance test which was abnormal indicating ST segment changes concerning for ischemia. He was subsequently referred for cardiac catheterization. That was performed on 02/24/2017 by Dr. Ellyn Hack which indicated a 90% mid circumflex lesion. This was stented with a Promus Premier 3.5 x 16 mm drug-eluting stent. It was noted that there was moderate disease in the ostial and mid LAD.[name] LVEF was 55-65% with normal LV and diastolic pressure. After the procedure he reports an improvement in his breathing however he does not feel that he is back to "baseline". I explained him that there may be certainly other factors causing him to be short of breath including his significant weight gain and abdominal obesity. We also discussed the fact that he is on dual antiplatelet therapy and that continued alcohol use is not advisable. Fortunately he is agreed to cardiac rehabilitation. I feel that will help with both weight management and cardiac vascular conditioning.  09/09/2017  Charles Villa was seen today in follow-up.  He continues to do well.  He completed cardiac rehabilitation.  He denies any chest pain or worsening shortness of breath.  He does feel some fatigue and blood pressure was noted to be low today 94/62.  He said that is unusual as typically his blood pressures around 154 008 systolic.  EKG shows sinus rhythm with no ischemia today.  He is on dual antiplatelet therapy with aspirin and Plavix which we will plan to continue for 6 months.  PMHx:  Past Medical History:  Diagnosis Date  .  Anal fissure 08/17/2012   Really more gluteal crease irritation- protosol hc 2.5% prn   . BACK PAIN, CHRONIC 02/10/2007  . CAD (coronary artery disease), native coronary artery    02/24/17-PCI/DES to mLcx, normal EF.  . Diverticulosis of colon    2007  . GERD 02/23/2007  . GERD (gastroesophageal reflux  disease)   . Hyperlipidemia   . Hypertension   . OCD (obsessive compulsive disorder) 01/23/2016   Ocd/hoarding issues- psychiatristis- [place] started on zoloft 100mg --> paxil  . Personal history of colonic polyps 03/14/2014   08/2005. Benign. 10 year repeat. 2010 colonoscopy in record was entered erroneously.    [name] PSORIASIS 02/10/2007   Topical therapy.     . Scalp psoriasis   . SQUAMOUS CELL CARCINOMA OF SKIN SITE UNSPECIFIED 08/24/2010   Annotation: face     Past Surgical History:  Procedure Laterality Date  . COLONOSCOPY W/ POLYPECTOMY     2007 [name]  . CORONARY STENT INTERVENTION N/A 02/24/2017   Procedure: Coronary Stent Intervention;  Surgeon: Leonie Man, MD;  Location: [facility];  Service: Cardiovascular;  Laterality: N/A;  . LEFT HEART CATH AND CORONARY ANGIOGRAPHY N/A 02/24/2017   Procedure: Left Heart Cath and Coronary Angiography;  Surgeon: Leonie Man, MD;  Location: [facility];  Service: Cardiovascular;  Laterality: N/A;  . SKIN CANCER EXCISION     squamous cell CA - nose  . TONSILLECTOMY      FAMHx:  Family History  Problem Relation Age of Onset  . Lung cancer Father   . Stroke Mother   . Hypertension Mother     SOCHx:   reports that  has never smoked. he has never used smokeless tobacco. He reports that he drinks about 12.6 oz of alcohol per week. He reports that he does not use drugs.  ALLERGIES:  No Known Allergies  ROS: Pertinent items noted in HPI and remainder of comprehensive ROS otherwise negative.  HOME MEDS: Current Outpatient Medications on File Prior to Visit  Medication Sig Dispense Refill  . aspirin 81 MG chewable tablet Chew 1 tablet (81 mg total) by mouth daily. 30 tablet 0  . bisoprolol-hydrochlorothiazide (ZIAC) 2.5-6.25 MG tablet Take 1 tablet by mouth daily. 90 tablet 3  . buPROPion (WELLBUTRIN XL) 300 MG 24 hr tablet Take 300 mg by mouth every morning.  1  . clobetasol (TEMOVATE) 0.05 % external solution as  needed.  5  . clobetasol cream (TEMOVATE) 0.05 % as needed.  3  . clopidogrel (PLAVIX) 75 MG tablet Take 1 tablet (75 mg total) by mouth daily with breakfast. 90 tablet 2  . clotrimazole-betamethasone (LOTRISONE) cream Apply 1 application topically 2 (two) times daily as needed. psoriass    . [employer] OMEGA-3 KRILL OIL 300 MG CAPS Take 1 tablet by mouth every other day.    [name] desonide (DESONATE) 0.05 % gel Apply 1 application topically 2 (two) times daily as needed. Skin lesions    . fluocinolone (VANOS) 0.01 % cream Apply 1 application topically daily as needed.     . hydrocortisone (ANUSOL-HC) 2.5 % rectal cream Place 1 application rectally 2 (two) times daily as needed for hemorrhoids or anal itching.    . hydrocortisone 2.5 % cream 2 (two) times daily.  5  . naltrexone (DEPADE) 50 MG tablet Take 1 tablet by mouth at bedtime.  2  . nitroGLYCERIN (NITROSTAT) 0.4 MG SL tablet Place 1 tablet (0.4 mg total) under the tongue every 5 (five) minutes  as needed. 25 tablet 3  . pantoprazole (PROTONIX) 40 MG tablet Take 1 tablet (40 mg total) by mouth daily. 30 tablet 11  . PARoxetine (PAXIL) 30 MG tablet Take 60 mg by mouth daily.   1  . Probiotic Product (PROBIOTIC DAILY PO) Take by mouth every other day.     . rosuvastatin (CRESTOR) 40 MG tablet Take 1 tablet (40 mg total) by mouth daily. 90 tablet 3  . silver sulfADIAZINE (SILVADENE) 1 % cream 2 (two) times daily.  5  . thiamine (VITAMIN B-1) 100 MG tablet Take 100 mg by mouth daily.     No current facility-administered medications on file prior to visit.     LABS/IMAGING: No results found for this or any previous visit (from the past 48 hour(s)). No results found.  LIPID PANEL:    Component Value Date/Time   CHOL 162 04/28/2017 0836   TRIG 96.0 04/28/2017 0836   TRIG 89 08/11/2006 0818   HDL 39.30 04/28/2017 0836   CHOLHDL 4 04/28/2017 0836   VLDL 19.2 04/28/2017 0836   LDLCALC 103 (H) 04/28/2017 0836   LDLDIRECT 145.3 07/12/2013 1046     WEIGHTS: Wt Readings from Last 3 Encounters:  09/09/17 223 lb (101.2 kg)  03/18/17 225 lb 15.5 oz (102.5 kg)  03/17/17 224 lb (101.6 kg)    VITALS: BP 94/62   Pulse 66   Ht 5\' 11"  (1.803 m)   Wt 223 lb (101.2 kg)   BMI 31.10 kg/m   EXAM: General appearance: alert and no distress Neck: no carotid bruit, no JVD and thyroid not enlarged, symmetric, no tenderness/mass/nodules Lungs: clear to auscultation bilaterally Heart: regular rate and rhythm, S1, S2 normal, no murmur, click, rub or gallop Abdomen: soft, non-tender; bowel sounds normal; no masses,  no organomegaly and obese Extremities: extremities normal, atraumatic, no cyanosis or edema Pulses: 2+ and symmetric Skin: Skin color, texture, turgor normal. No rashes or lesions Neurologic: Grossly normal Psych: Pleasant  EKG: Normal sinus rhythm at 66-personally reviewed  ASSESSMENT: 1. Coronary artery disease status post PCI to the mid circumflex-Promus Premier 3.5 by 16mm DES (01/2017) 2. Alcohol abuse 3. Hypertension 4. Dyslipidemia 5. Obesity/Metabolic syndrome  PLAN: 1.   Charles Villa is symptomatically improved after PCI in July 2018.  He will need to remain on aspirin and Plavix until July 2019.  Blood pressure is well controlled.  His cholesterol has been at goal.  He is working on alcohol abstinence.  He completed cardiac rehabilitation and is generally asymptomatic other than some fatigue.  Follow-up in 6 months.  Pixie Casino, MD, [facility], [place] Director of the Advanced Lipid Disorders &  Cardiovascular Risk Reduction Clinic Diplomate of the American Board of Clinical Lipidology Attending Cardiologist  Direct Dial: [phone]  Fax: [phone]  Website:  [email or web address] [email or web address] Hickle 09/10/2017, 10:49 AM

## 2017-12-08 DIAGNOSIS — R69 Illness, unspecified: Secondary | ICD-10-CM | POA: Diagnosis not present

## 2017-12-15 ENCOUNTER — Other Ambulatory Visit: Payer: Self-pay | Admitting: Cardiology

## 2018-02-03 NOTE — Progress Notes (Signed)
Subjective:   Charles Villa is a 72 y.o. male who presents for Medicare Annual/Subsequent preventive examination.  Lives with wife in two story single family home. Also has a townhouse in [facility] that they travel to.   Review of Systems:  No ROS.  Medicare Wellness Visit. Additional risk factors are reflected in the social history.  Sleeps off and an throughout night. Averages 4-6 hrs/night. Has energy during the day. Rarely takes naps.  Cardiac Risk Factors include: advanced age (>28men, >30 women);dyslipidemia;obesity (BMI >30kg/m2)     Objective:    Vitals: BP 104/68   Pulse 65   Resp 16   Ht 5\' 11"  (1.803 m)   Wt 219 lb 9.6 oz (99.6 kg)   SpO2 96%   BMI 30.63 kg/m   Body mass index is 30.63 kg/m.  Advanced Directives 02/04/2018 02/24/2017 02/03/2017 10/23/2015  Does Patient Have a Medical Advance Directive? No Yes Yes Yes  Type of Advance Directive - [place];Living will [place];Living will -  Does patient want to make changes to medical advance directive? - No - Patient declined No - Patient declined -  Copy of [place] in Chart? - No - copy requested No - copy requested -  Would patient like information on creating a medical advance directive? No - Patient declined - - -    Tobacco Social History   Tobacco Use  Smoking Status Never Smoker  Smokeless Tobacco Never Used     Counseling given: Not Answered  Past Medical History:  Diagnosis Date  . Anal fissure 08/17/2012   Really more gluteal crease irritation- protosol hc 2.5% prn   . BACK PAIN, CHRONIC 02/10/2007  . CAD (coronary artery disease), native coronary artery    02/24/17-PCI/DES to mLcx, normal EF.  . Diverticulosis of colon    2007  . GERD 02/23/2007  . GERD (gastroesophageal reflux disease)   . Hyperlipidemia   . Hypertension   . OCD (obsessive compulsive disorder) 01/23/2016   Ocd/hoarding issues- psychiatristis- [place]  started on zoloft 100mg --> paxil  . Personal history of colonic polyps 03/14/2014   08/2005. Benign. 10 year repeat. 2010 colonoscopy in record was entered erroneously.    [name] PSORIASIS 02/10/2007   Topical therapy.     . Scalp psoriasis   . SQUAMOUS CELL CARCINOMA OF SKIN SITE UNSPECIFIED 08/24/2010   Annotation: face    Past Surgical History:  Procedure Laterality Date  . COLONOSCOPY W/ POLYPECTOMY     2007 [name]  . CORONARY STENT INTERVENTION N/A 02/24/2017   Procedure: Coronary Stent Intervention;  Surgeon: Leonie Man, MD;  Location: [facility];  Service: Cardiovascular;  Laterality: N/A;  . LEFT HEART CATH AND CORONARY ANGIOGRAPHY N/A 02/24/2017   Procedure: Left Heart Cath and Coronary Angiography;  Surgeon: Leonie Man, MD;  Location: [facility];  Service: Cardiovascular;  Laterality: N/A;  . SKIN CANCER EXCISION     squamous cell CA - nose  . TONSILLECTOMY     Family History  Problem Relation Age of Onset  . Lung cancer Father   . Stroke Mother   . Hypertension Mother    Social History   Socioeconomic History  . Marital status: Married    Spouse name: Not on file  . Number of children: 2  . Years of education: Not on file  . Highest education level: Not on file  Occupational History  . Occupation: Therapist, art  Lobbyist: UNEMPLOYED  Social Needs  . Financial resource strain: Not on file  . Food insecurity:    Worry: Not on file    Inability: Not on file  . Transportation needs:    Medical: Not on file    Non-medical: Not on file  Tobacco Use  . Smoking status: Never Smoker  . Smokeless tobacco: Never Used  Substance and Sexual Activity  . Alcohol use: Yes    Alcohol/week: 12.6 oz    Types: 21 Standard drinks or equivalent per week    Comment: 2-3 per day (gin)  . Drug use: No  . Sexual activity: Not on file  Lifestyle  . Physical activity:    Days per week: Not on file    Minutes per session: Not on file  .  Stress: Not on file  Relationships  . Social connections:    Talks on phone: Not on file    Gets together: Not on file    Attends religious service: Not on file    Active member of club or organization: Not on file    Attends meetings of clubs or organizations: Not on file    Relationship status: Not on file  Other Topics Concern  . Not on file  Social History Narrative   Married 42 years in 2015. 2 kids, 1 grandchild that is 74.28 years old.       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       Hobbies: ride bike, ski in winter     Outpatient Encounter Medications as of 02/04/2018  Medication Sig  . aspirin 81 MG chewable tablet Chew 1 tablet (81 mg total) by mouth daily.  [name] b complex vitamins capsule Take 1 capsule by mouth daily.  . bisoprolol-hydrochlorothiazide (ZIAC) 2.5-6.25 MG tablet Take 1 tablet by mouth daily.  . clobetasol (TEMOVATE) 0.05 % external solution as needed.  . clobetasol cream (TEMOVATE) 0.05 % as needed.  . clopidogrel (PLAVIX) 75 MG tablet TAKE 1 TABLET EVERY DAY WITH BREAKFAST  . clotrimazole-betamethasone (LOTRISONE) cream Apply 1 application topically 2 (two) times daily as needed. psoriass  . CVS OMEGA-3 KRILL OIL 300 MG CAPS Take 1 tablet by mouth every other day.  [name] desonide (DESONATE) 0.05 % gel Apply 1 application topically 2 (two) times daily as needed. Skin lesions  . fluocinolone (VANOS) 0.01 % cream Apply 1 application topically daily as needed.   . hydrocortisone (ANUSOL-HC) 2.5 % rectal cream Place 1 application rectally 2 (two) times daily as needed for hemorrhoids or anal itching.  . hydrocortisone 2.5 % cream 2 (two) times daily.  . naltrexone (DEPADE) 50 MG tablet Take 1 tablet by mouth at bedtime.  . nitroGLYCERIN (NITROSTAT) 0.4 MG SL tablet Place 1 tablet (0.4 mg total) under the tongue every 5 (five) minutes as needed.  . pantoprazole (PROTONIX) 40 MG tablet Take 1 tablet (40 mg total) by mouth daily.  .  Probiotic Product (PROBIOTIC DAILY PO) Take by mouth every other day.   . rosuvastatin (CRESTOR) 40 MG tablet Take 1 tablet (40 mg total) by mouth daily.  . silver sulfADIAZINE (SILVADENE) 1 % cream 2 (two) times daily.  [name] buPROPion (WELLBUTRIN XL) 300 MG 24 hr tablet Take 300 mg by mouth every morning.  [name] PARoxetine (PAXIL) 30 MG tablet Take 60 mg by mouth daily.   [name] thiamine (VITAMIN B-1) 100 MG tablet Take 100 mg by mouth daily.   No facility-administered  encounter medications on file as of 02/04/2018.     Activities of Daily Living In your present state of health, do you have any difficulty performing the following activities: 02/04/2018 02/24/2017  Hearing? N N  Vision? N N  Difficulty concentrating or making decisions? N N  Walking or climbing stairs? N N  Dressing or bathing? N N  Doing errands, shopping? N N  Preparing Food and eating ? N -  Using the Toilet? N -  In the past six months, have you accidently leaked urine? N -  Do you have problems with loss of bowel control? N -  Managing your Medications? N -  Managing your Finances? N -  Housekeeping or managing your Housekeeping? N -  Some recent data might be hidden    Patient Care Team: Marin Olp, MD as PCP - General (Family Medicine) Debara Pickett Nadean Corwin, MD as Consulting Physician (Cardiology) Carmela Rima, DMD as Consulting Physician (Dentistry)   Assessment:   This is a routine wellness examination for Toshiro.  Exercise Activities and Dietary recommendations Current Exercise Habits: The patient does not participate in regular exercise at present(Does yard work), Exercise limited by: None identified  Eats 3 meals/day. Snacks on chips. Drinks 2-3 glasses water/day. Occasional soda. Breakfast: OJ, cereal or toast and jam. Sunday usually eats eggs. Lunch: Sandwich and salad or vegetables. Dinner: Meat and potatoes or pasta.   Goals    . Weight (lb) < 200 lb (90.7 kg)     Eat lighter.        Fall  Risk Fall Risk  02/04/2018 03/18/2017 02/03/2017 12/16/2016 10/07/2016  Falls in the past year? Yes Yes Yes Yes Yes  Number falls in past yr: 1 1 2  or more 1 -  Injury with Fall? Yes Yes Yes No -  Comment - - Broken front tooth - -  Risk Factor Category  High Fall Risk High Fall Risk High Fall Risk - -  Risk for fall due to : - History of fall(s) History of fall(s) - -  Follow up Falls prevention discussed;Education provided Falls evaluation completed Education provided;Falls prevention discussed Falls evaluation completed -   Depression Screen PHQ 2/9 Scores 02/04/2018 06/16/2017 03/26/2017 03/26/2017  PHQ - 2 Score 0 0 0 0  PHQ- 9 Score 0 - - -  PHQ9 completed. No signs or symptoms of depression. Patient and his wife still travel to their house in [facility]. Patient does some travelling for work and will be planning a ski trip to [place] with his son. Total time spent on topic was 9 minutes.  Cognitive Function Ad8 score reviewed for issues:  Issues making decisions:no  Less interest in hobbies / activities:no  Repeats questions, stories (family complaining):no  Trouble using ordinary gadgets (microwave, computer, phone):no  Forgets the month or year: no  Mismanaging finances: no  Remembering appts:no  Daily problems with thinking and/or memory:no Ad8 score is=0 Still working occasionally.         Immunization History  Administered Date(s) Administered  . DTP 08/10/2007  . Influenza Split 06/08/2012  . Influenza Whole 08/21/2009  . Influenza, High Dose Seasonal PF 05/12/2013, 05/06/2016  . Influenza, Seasonal, Injecte, Preservative Fre 05/23/2013  . Influenza-Unspecified 05/01/2015  . Pneumococcal Conjugate-13 08/30/2013  . Pneumococcal Polysaccharide-23 03/11/2014  . Td 07/29/1998, 08/15/2008  . Tdap 10/07/2016   Screening Tests Health Maintenance  Topic Date Due  . Hepatitis C Screening  [birth year]  . INFLUENZA VACCINE  02/26/2018  . COLONOSCOPY  10/23/2018  . TETANUS/TDAP  10/08/2026  . PNA vac Low Risk Adult  Completed       Plan:   Follow up with PCP as directed.  I have personally reviewed and noted the following in the patient's chart:   . Medical and social history . Use of alcohol, tobacco or illicit drugs  . Current medications and supplements . Functional ability and status . Nutritional status . Physical activity . Advanced directives . List of other physicians . Vitals . Screenings to include cognitive, depression, and falls . Referrals and appointments  In addition, I have reviewed and discussed with patient certain preventive protocols, quality metrics, and best practice recommendations. A written personalized care plan for preventive services as well as general preventive health recommendations were provided to patient.     Williemae Area, RN  02/04/2018

## 2018-02-03 NOTE — Progress Notes (Signed)
PCP notes:   Health maintenance: Hep C screening: entered future order.   Abnormal screenings:    Patient concerns: Right hand joint pain, takes Tylenol to help. Left lower quadrant gets sore when constipated. Constipation that comes and goes, chronic.    Nurse concerns: none.   Next PCP appt: scheduled follow up 03/05/18.

## 2018-02-04 ENCOUNTER — Encounter: Payer: Self-pay | Admitting: *Deleted

## 2018-02-04 ENCOUNTER — Ambulatory Visit (INDEPENDENT_AMBULATORY_CARE_PROVIDER_SITE_OTHER): Payer: Medicare HMO | Admitting: *Deleted

## 2018-02-04 VITALS — BP 104/68 | HR 65 | Resp 16 | Ht 71.0 in | Wt 219.6 lb

## 2018-02-04 DIAGNOSIS — Z0000 Encounter for general adult medical examination without abnormal findings: Secondary | ICD-10-CM

## 2018-02-04 DIAGNOSIS — Z1331 Encounter for screening for depression: Secondary | ICD-10-CM

## 2018-02-04 DIAGNOSIS — Z1159 Encounter for screening for other viral diseases: Secondary | ICD-10-CM

## 2018-02-04 NOTE — Patient Instructions (Addendum)
[name] , Thank you for taking time to come for your Medicare Wellness Visit. I appreciate your ongoing commitment to your health goals. Please review the following plan we discussed and let me know if I can assist you in the future.   These are the goals we discussed: Goals    . Weight (lb) < 200 lb (90.7 kg)     Eat lighter.        This is a list of the screening recommended for you and due dates:  Health Maintenance  Topic Date Due  .  Hepatitis C: One time screening is recommended by Center for Disease Control  (CDC) for  adults born from [birth year] through 1963.   Aug 20, 1945  . Flu Shot  02/26/2018  . Colon Cancer Screening  10/23/2018  . Tetanus Vaccine  10/08/2026  . Pneumonia vaccines  Completed   Preventive Care for Adults  A healthy lifestyle and preventive care can promote health and wellness. Preventive health guidelines for adults include the following key practices.  . A routine yearly physical is a good way to check with your health care provider about your health and preventive screening. It is a chance to share any concerns and updates on your health and to receive a thorough exam.  . Visit your dentist for a routine exam and preventive care every 6 months. Brush your teeth twice a day and floss once a day. Good oral hygiene prevents tooth decay and gum disease.  . The frequency of eye exams is based on your age, health, family medical history, use  of contact lenses, and other factors. Follow your health care provider's recommendations for frequency of eye exams.  . Eat a healthy diet. Foods like vegetables, fruits, whole grains, low-fat dairy products, and lean protein foods contain the nutrients you need without too many calories. Decrease your intake of foods high in solid fats, added sugars, and salt. Eat the right amount of calories for you. Get information about a proper diet from your health care provider, if necessary.  . Regular physical exercise is one of  the most important things you can do for your health. Most adults should get at least 150 minutes of moderate-intensity exercise (any activity that increases your heart rate and causes you to sweat) each week. In addition, most adults need muscle-strengthening exercises on 2 or more days a week.  Silver Sneakers may be a benefit available to you. To determine eligibility, you may visit the website: www.silversneakers.com or contact program at [phone] Mon-Fri between 8AM-8PM.   . Maintain a healthy weight. The body mass index (BMI) is a screening tool to identify possible weight problems. It provides an estimate of body fat based on height and weight. Your health care provider can find your BMI and can help you achieve or maintain a healthy weight.   For adults 20 years and older: ? A BMI below 18.5 is considered underweight. ? A BMI of 18.5 to 24.9 is normal. ? A BMI of 25 to 29.9 is considered overweight. ? A BMI of 30 and above is considered obese.   . Maintain normal blood lipids and cholesterol Charles Villa by exercising and minimizing your intake of saturated fat. Eat a balanced diet with plenty of fruit and vegetables. Blood tests for lipids and cholesterol should begin at age 8 and be repeated every 5 years. If your lipid or cholesterol Charles Villa are high, you are over 50, or you are at high risk for heart disease, you  may need your cholesterol Charles Villa checked more frequently. Ongoing high lipid and cholesterol Charles Villa should be treated with medicines if diet and exercise are not working.  . If you smoke, find out from your health care provider how to quit. If you do not use tobacco, please do not start.  . If you choose to drink alcohol, please do not consume more than 2 drinks per day. One drink is considered to be 12 ounces (355 mL) of beer, 5 ounces (148 mL) of wine, or 1.5 ounces (44 mL) of liquor.  . If you are 59-72 years old, ask your health care provider if you should take aspirin to  prevent strokes.  . Use sunscreen. Apply sunscreen liberally and repeatedly throughout the day. You should seek shade when your shadow is shorter than you. Protect yourself by wearing long sleeves, pants, a wide-brimmed hat, and sunglasses year round, whenever you are outdoors.  . Once a month, do a whole body skin exam, using a mirror to look at the skin on your back. Tell your health care provider of new moles, moles that have irregular borders, moles that are larger than a pencil eraser, or moles that have changed in shape or color.

## 2018-02-06 NOTE — Progress Notes (Signed)
I have personally reviewed the Medicare Annual Wellness questionnaire and have noted 1. The patient's medical and social history 2. Their use of alcohol, tobacco or illicit drugs 3. Their current medications and supplements 4. The patient's functional ability including ADL's, fall risks, home safety risks and hearing or visual impairment. 5. Diet and physical activities 6. Evidence for depression or mood disorders 7. Reviewed Updated provider list, see scanned forms and [facility] Snapshot.   The patients weight, height, BMI and visual acuity have been recorded in the chart I have made referrals, counseling and provided education to the patient based review of the above and I have provided the pt with a written personalized care plan for preventive services.  I have provided the patient with a copy of your personalized plan for preventive services. Instructed to take the time to review along with their updated medication list.  Charles Rowton DO 

## 2018-02-28 ENCOUNTER — Other Ambulatory Visit: Payer: Self-pay | Admitting: Internal Medicine

## 2018-03-05 ENCOUNTER — Ambulatory Visit: Payer: Medicare HMO | Admitting: Family Medicine

## 2018-03-05 ENCOUNTER — Encounter: Payer: Self-pay | Admitting: Family Medicine

## 2018-03-05 ENCOUNTER — Ambulatory Visit (INDEPENDENT_AMBULATORY_CARE_PROVIDER_SITE_OTHER): Payer: Medicare HMO | Admitting: Family Medicine

## 2018-03-05 VITALS — BP 112/72 | HR 57 | Temp 98.4°F | Ht 71.0 in | Wt 220.0 lb

## 2018-03-05 DIAGNOSIS — Z9861 Coronary angioplasty status: Secondary | ICD-10-CM

## 2018-03-05 DIAGNOSIS — E785 Hyperlipidemia, unspecified: Secondary | ICD-10-CM

## 2018-03-05 DIAGNOSIS — I2510 Atherosclerotic heart disease of native coronary artery without angina pectoris: Secondary | ICD-10-CM | POA: Diagnosis not present

## 2018-03-05 DIAGNOSIS — I10 Essential (primary) hypertension: Secondary | ICD-10-CM | POA: Diagnosis not present

## 2018-03-05 DIAGNOSIS — Z1159 Encounter for screening for other viral diseases: Secondary | ICD-10-CM

## 2018-03-05 DIAGNOSIS — F1021 Alcohol dependence, in remission: Secondary | ICD-10-CM | POA: Diagnosis not present

## 2018-03-05 DIAGNOSIS — R69 Illness, unspecified: Secondary | ICD-10-CM | POA: Diagnosis not present

## 2018-03-05 NOTE — Progress Notes (Signed)
Subjective:  Charles Villa is a 72 y.o. year old very pleasant male patient who presents for/with See problem oriented charting ROS- no recent chest pain. No reported shortness of breath. No edema. Having some hand pain and some pain in lower abdomen before bowel movements   Past Medical History-  Patient Active Problem List   Diagnosis Date Noted  . CAD S/P percutaneous coronary angioplasty 02/24/2017    Priority: High  . Thiamine deficiency 04/28/2017    Priority: Medium  . OCD (obsessive compulsive disorder) 01/23/2016    Priority: Medium  . Essential hypertension 03/11/2014    Priority: Medium  . Hyperlipidemia with target low density lipoprotein (LDL) cholesterol less than 70 mg/dL 02/10/2007    Priority: Medium  . PSORIASIS 02/10/2007    Priority: Medium  . Personal history of colonic polyps 03/14/2014    Priority: Low  . Anal fissure 08/17/2012    Priority: Low  . SQUAMOUS CELL CARCINOMA OF SKIN SITE UNSPECIFIED 08/24/2010    Priority: Low  . GERD 02/23/2007    Priority: Low  . BACK PAIN, CHRONIC 02/10/2007    Priority: Low  . Pure hypercholesterolemia 03/17/2017  . Exertional dyspnea 02/24/2017  . Angina of effort ([place]) 02/24/2017  . S/P cardiac catheterization 02/24/2017  . Coronary artery disease involving native coronary artery of native heart with angina pectoris ([place])   . Abnormal cardiovascular stress test 02/20/2017  . Other fatigue 02/20/2017  . Shortness of breath 02/03/2017  . Alcoholism in recovery ([place]) 02/03/2017  . Tremor 05/06/2016    Medications- reviewed and updated Current Outpatient Medications  Medication Sig Dispense Refill  . aspirin EC 81 MG tablet Take 81 mg by mouth daily.    [name] b complex vitamins capsule Take 1 capsule by mouth daily.    . bisoprolol-hydrochlorothiazide (ZIAC) 2.5-6.25 MG tablet Take 1 tablet by mouth daily. 90 tablet 3  . clobetasol (TEMOVATE) 0.05 % external solution as needed.  5  . clobetasol cream (TEMOVATE)  0.05 % as needed.  3  . clopidogrel (PLAVIX) 75 MG tablet TAKE 1 TABLET EVERY DAY WITH BREAKFAST 90 tablet 1  . clotrimazole-betamethasone (LOTRISONE) cream Apply 1 application topically 2 (two) times daily as needed. psoriass    . [employer] OMEGA-3 KRILL OIL 300 MG CAPS Take 1 tablet by mouth every other day.    [name] desonide (DESONATE) 0.05 % gel Apply 1 application topically 2 (two) times daily as needed. Skin lesions    . fluocinolone (VANOS) 0.01 % cream Apply 1 application topically daily as needed.     . hydrocortisone (ANUSOL-HC) 2.5 % rectal cream Place 1 application rectally 2 (two) times daily as needed for hemorrhoids or anal itching.    . hydrocortisone 2.5 % cream as needed.   5  . nitroGLYCERIN (NITROSTAT) 0.4 MG SL tablet Place 1 tablet (0.4 mg total) under the tongue every 5 (five) minutes as needed. 25 tablet 3  . pantoprazole (PROTONIX) 40 MG tablet TAKE 1 TABLET BY MOUTH EVERY DAY 90 tablet 3  . Probiotic Product (PROBIOTIC DAILY PO) Take by mouth every other day.     . rosuvastatin (CRESTOR) 40 MG tablet Take 1 tablet (40 mg total) by mouth daily. 90 tablet 3  . silver sulfADIAZINE (SILVADENE) 1 % cream as needed.   5   No current facility-administered medications for this visit.     Objective: BP 112/72 (BP Location: Left Arm, Patient Position: Sitting, Cuff Size: Normal)   Pulse (!) 57   Temp  98.4 F (36.9 C) (Oral)   Ht 5\' 11"  (1.803 m)   Wt 220 lb (99.8 kg)   SpO2 93%   BMI 30.68 kg/m  Gen: NAD, resting comfortably CV: RRR no murmurs rubs or gallops Lungs: CTAB no crackles, wheeze, rhonchi Abdomen: soft/nontender including in LLQ/nondistended/normal bowel sounds. No rebound or guarding.  Ext: no edema Skin: warm, dry  Assessment/Plan:  Joint pain in hands Constipation/pain before BM S: from awv "Patient concerns: Right hand joint pain, takes Tylenol to help. Left lower quadrant gets sore when constipated. Constipation that comes and goes, chronic. "  He is  taking tylenol each Am many mornings to try to help with hand joint pain and it is helpful. Sparing dose in PM (max 1000mg  per day). Mainly MCP joints. No pain right now. Worse on right hand and he is right handed. Denies trauma, injury lately.   Usually gets some LLQ pai in AM before he defecates. Goes away with each bowel movement but comes back next day. For most part daily BMs. BMs can be firm particularly over last few weeks. Prior to that had a period of diarrhea after big meal of saurkraut- but lasted 4-5 days. No blood in stool except for occasional drop of blood if stool really hard. . No melena.   Up to date on colonoscopy as of 10/23/15 with plan for 3 year follow up.  A/P: Joint pain- likely OA hands- im fine with prn tylenol or even scheduled daily Abdominal pain- sounds like related to constipation- discussed remaining well hydrated to help avoid. Also has close follo wup for colonoscopy next year- encouraged him to keep this  Essential hypertension S: controlled on ziac 2.5-6.25mg  BP Readings from Last 3 Encounters:  03/05/18 112/72  02/04/18 104/68  09/09/17 94/62  A/P:  Continue current meds:  ziac alone. On problem list listed hctz- I am not why switched back but with excellent control we will leave current rx  Hyperlipidemia with target low density lipoprotein (LDL) cholesterol less than 70 mg/dL S: poorly controlled on last check. Was previously on zocor- LDL goal now under 70- now on rosuvastatin 40mg  Lab Results  Component Value Date   CHOL 162 04/28/2017   HDL 39.30 04/28/2017   LDLCALC 103 (H) 04/28/2017   LDLDIRECT 145.3 07/12/2013   TRIG 96.0 04/28/2017   CHOLHDL 4 04/28/2017   A/P: he agrees to lipid recheck at CPE in 2-6 months    Alcoholism in recovery [facility]) Still drinking but at least keeping to 1-2 drinks per day. If he were to go above this we would need to push for full cessation  CAD S/P percutaneous coronary angioplasty Stent placed in July of last  year. Per Charles Villa last note looks like patient may be able to stop plavix at next visit. He is due for cardiology visit. He is asymptomatic other than side effect of very easy bruising/bleeding on aspirin and plavix though he does pick at skin causing issues with bleeding frequently. I encouraged follow up with cardiology to see if plavix can be stopped  cpe advised - has Bernadene Person Future Appointments  Date Time Provider [employer]  05/05/2018  2:30 PM Marin Olp, MD [facility]  02/08/2019  2:00 PM [facility] HEALTH COACH [facility]   Return precautions advised.  Garret Reddish, MD

## 2018-03-05 NOTE — Assessment & Plan Note (Signed)
Still drinking but at least keeping to 1-2 drinks per day. If he were to go above this we would need to push for full cessation

## 2018-03-05 NOTE — Assessment & Plan Note (Signed)
S: controlled on ziac 2.5-6.25mg  BP Readings from Last 3 Encounters:  03/05/18 112/72  02/04/18 104/68  09/09/17 94/62  A/P:  Continue current meds:  ziac alone. On problem list listed hctz- I am not why switched back but with excellent control we will leave current rx

## 2018-03-05 NOTE — Patient Instructions (Addendum)
Health Maintenance Due  Topic Date Due  . Hepatitis C Screening - Today at office visit- stop by lab 05-Jun-1946  . INFLUENZA VACCINE -Please schedule this in the fall- could do at your next visit if before year end 02/26/2018   . Please check with your pharmacy to see if they have the shingrix vaccine. If they do- please get this immunization and update [place] by phone call or mychart with dates you receive the vaccine.   [name] 2-6 months for a physical and we will update bloodwork    Please call cardiology for follow up - looks like they wanted to see you around this time and may even stop your plavix  No changes in meds today

## 2018-03-05 NOTE — Assessment & Plan Note (Signed)
Stent placed in July of last year. Per Dr. Lysbeth Penner last note looks like patient may be able to stop plavix at next visit. He is due for cardiology visit. He is asymptomatic other than side effect of very easy bruising/bleeding on aspirin and plavix though he does pick at skin causing issues with bleeding frequently. I encouraged follow up with cardiology to see if plavix can be stopped

## 2018-03-05 NOTE — Assessment & Plan Note (Signed)
S: poorly controlled on last check. Was previously on zocor- LDL goal now under 70- now on rosuvastatin 40mg  Lab Results  Component Value Date   CHOL 162 04/28/2017   HDL 39.30 04/28/2017   LDLCALC 103 (H) 04/28/2017   LDLDIRECT 145.3 07/12/2013   TRIG 96.0 04/28/2017   CHOLHDL 4 04/28/2017   A/P: he agrees to lipid recheck at CPE in 2-6 months

## 2018-03-06 LAB — HEPATITIS C ANTIBODY
Hepatitis C Ab: NONREACTIVE
SIGNAL TO CUT-OFF: 0.07 (ref <1.00)

## 2018-03-13 NOTE — Progress Notes (Signed)
Erroneous encounter- see note from 8/8

## 2018-03-25 ENCOUNTER — Encounter: Payer: Self-pay | Admitting: Internal Medicine

## 2018-03-25 ENCOUNTER — Ambulatory Visit: Payer: Medicare HMO | Admitting: Internal Medicine

## 2018-03-25 VITALS — BP 110/70 | HR 55 | Ht 71.0 in | Wt 223.2 lb

## 2018-03-25 DIAGNOSIS — E7800 Pure hypercholesterolemia, unspecified: Secondary | ICD-10-CM | POA: Diagnosis not present

## 2018-03-25 DIAGNOSIS — I2510 Atherosclerotic heart disease of native coronary artery without angina pectoris: Secondary | ICD-10-CM | POA: Diagnosis not present

## 2018-03-25 DIAGNOSIS — Z9861 Coronary angioplasty status: Secondary | ICD-10-CM | POA: Diagnosis not present

## 2018-03-25 DIAGNOSIS — R69 Illness, unspecified: Secondary | ICD-10-CM | POA: Diagnosis not present

## 2018-03-25 DIAGNOSIS — F1010 Alcohol abuse, uncomplicated: Secondary | ICD-10-CM | POA: Diagnosis not present

## 2018-03-25 DIAGNOSIS — I10 Essential (primary) hypertension: Secondary | ICD-10-CM

## 2018-03-25 NOTE — Progress Notes (Signed)
OFFICE CONSULT NOTE  Chief Complaint:  Routine follow-up  Primary Care Physician: Marin Olp, MD  HPI:  Charles Villa is a 72 y.o. male who is being seen today for the evaluation of shortness of breath at the request of Marin Olp, MD. Charles Villa has a history of hypertension, dyslipidemia, GERD, chronic back pain, OCD and long-standing alcohol use, according to his wife who did most the talking during the visit he drinks about 2-3 drinks per day however could be more. She says that's increased more recently. She notes that over the past 2 years she's had progressively worsening shortness of breath. 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 They have a vacation house at [facility] and he has approximate 65 steps to climb up. He says he gets short of breath walking up the stairs and has been more difficult to do that without stopping. He seems to minimize his symptoms. His wife is concerned about possible coronary artery disease as a cause of this. She used to work in cardiac rehabilitation, possibly is an Physiological scientist. There is a family history of hypertension and stroke in his mother and his father who was a smoker died of lung cancer. He denies any chest pain or pressure, palpitations, presyncope or syncopal symptoms. He also denies any fever, chills, cough or other infective symptoms.  02/20/2017  Charles Villa returns today for follow-up. He underwent echocardiogram as well as an exercise treadmill stress test. The exercise treadmill stress test unfortunately was abnormal indicating ST segment depression in leads 23 aVF and V5 and V6 beginning at 5 minutes a stress returning to baseline after about 5-9 minutes of recovery. His echocardiogram demonstrated normal systolic function, mild diastolic dysfunction and some mild valvular abnormalities. I reviewed the results for him today along with his wife and my recommendations for left  heart catheterization.  03/17/2017  Charles Villa was seen today in follow-up. He underwent a graded exercise tolerance test which was abnormal indicating ST segment changes concerning for ischemia. He was subsequently referred for cardiac catheterization. That was performed on 02/24/2017 by Dr. Ellyn Hack which indicated a 90% mid circumflex lesion. This was stented with a Promus Premier 3.5 x 16 mm drug-eluting stent. It was noted that there was moderate disease in the ostial and mid LAD.[name] LVEF was 55-65% with normal LV and diastolic pressure. After the procedure he reports an improvement in his breathing however he does not feel that he is back to "baseline". I explained him that there may be certainly other factors causing him to be short of breath including his significant weight gain and abdominal obesity. We also discussed the fact that he is on dual antiplatelet therapy and that continued alcohol use is not advisable. Fortunately he is agreed to cardiac rehabilitation. I feel that will help with both weight management and cardiac vascular conditioning.  09/09/2017  Charles Villa was seen today in follow-up.  He continues to do well.  He completed cardiac rehabilitation.  He denies any chest pain or worsening shortness of breath.  He does feel some fatigue and blood pressure was noted to be low today 94/62.  He said that is unusual as typically his blood pressures around 161 096 systolic.  EKG shows sinus rhythm with no ischemia today.  He is on dual antiplatelet therapy with aspirin and Plavix which we will plan to continue for 6 months.  03/25/2018  Charles Villa returns today for follow-up.  Overall  he reports feeling well and is generally asymptomatic.  He says he is able to walk up and down the hill to [place] at [facility] now without much difficulty.  He mentioned that he has been able to cut back on his alcohol somewhat but has not quit completely.  He is tolerating his heart  failure medicines.  Blood pressure is excellent today 110/70.  He is now a year out from stenting to the mid circumflex artery.  He continues on aspirin and Plavix.  PMHx:  Past Medical History:  Diagnosis Date  . Anal fissure 08/17/2012   Really more gluteal crease irritation- protosol hc 2.5% prn   . BACK PAIN, CHRONIC 02/10/2007  . CAD (coronary artery disease), native coronary artery    02/24/17-PCI/DES to mLcx, normal EF.  . Diverticulosis of colon    2007  . GERD 02/23/2007  . GERD (gastroesophageal reflux disease)   . Hyperlipidemia   . Hypertension   . OCD (obsessive compulsive disorder) 01/23/2016   Ocd/hoarding issues- psychiatristis- [place] started on zoloft 100mg --> paxil  . Personal history of colonic polyps 03/14/2014   08/2005. Benign. 10 year repeat. 2010 colonoscopy in record was entered erroneously.    [name] PSORIASIS 02/10/2007   Topical therapy.     . Scalp psoriasis   . SQUAMOUS CELL CARCINOMA OF SKIN SITE UNSPECIFIED 08/24/2010   Annotation: face     Past Surgical History:  Procedure Laterality Date  . COLONOSCOPY W/ POLYPECTOMY     2007 [name]  . CORONARY STENT INTERVENTION N/A 02/24/2017   Procedure: Coronary Stent Intervention;  Surgeon: Leonie Man, MD;  Location: [place] [facility];  Service: Cardiovascular;  Laterality: N/A;  . LEFT HEART CATH AND CORONARY ANGIOGRAPHY N/A 02/24/2017   Procedure: Left Heart Cath and Coronary Angiography;  Surgeon: Leonie Man, MD;  Location: [place] [facility];  Service: Cardiovascular;  Laterality: N/A;  . SKIN CANCER EXCISION     squamous cell CA - nose  . TONSILLECTOMY      FAMHx:  Family History  Problem Relation Age of Onset  . Lung cancer Father   . Stroke Mother   . Hypertension Mother     SOCHx:   reports that he has never smoked. He has never used smokeless tobacco. He reports that he drinks about 21.0 standard drinks of alcohol per week. He reports that he does not use drugs.  ALLERGIES:  No Known  Allergies  ROS: Pertinent items noted in HPI and remainder of comprehensive ROS otherwise negative.  HOME MEDS: Current Outpatient Medications on File Prior to Visit  Medication Sig Dispense Refill  . aspirin EC 81 MG tablet Take 81 mg by mouth daily.    [name] b complex vitamins capsule Take 1 capsule by mouth daily.    . clobetasol (TEMOVATE) 0.05 % external solution as needed.  5  . clobetasol cream (TEMOVATE) 0.05 % as needed.  3  . clopidogrel (PLAVIX) 75 MG tablet TAKE 1 TABLET EVERY DAY WITH BREAKFAST 90 tablet 1  . clotrimazole-betamethasone (LOTRISONE) cream Apply 1 application topically 2 (two) times daily as needed. psoriass    . [employer]S OMEGA-3 KRILL OIL 300 MG CAPS Take 1 tablet by mouth every other day.    [name] desonide (DESONATE) 0.05 % gel Apply 1 application topically 2 (two) times daily as needed. Skin lesions    . fluocinolone (VANOS) 0.01 % cream Apply 1 application topically daily as needed.     [name]  hydrocortisone (ANUSOL-HC) 2.5 % rectal cream Place 1 application rectally 2 (two) times daily as needed for hemorrhoids or anal itching.    . nitroGLYCERIN (NITROSTAT) 0.4 MG SL tablet Place 1 tablet (0.4 mg total) under the tongue every 5 (five) minutes as needed. 25 tablet 3  . pantoprazole (PROTONIX) 40 MG tablet TAKE 1 TABLET BY MOUTH EVERY DAY 90 tablet 3  . Probiotic Product (PROBIOTIC DAILY PO) Take by mouth every other day.     . rosuvastatin (CRESTOR) 40 MG tablet Take 1 tablet (40 mg total) by mouth daily. 90 tablet 3  . silver sulfADIAZINE (SILVADENE) 1 % cream as needed.   5  . bisoprolol-hydrochlorothiazide (ZIAC) 2.5-6.25 MG tablet Take 1 tablet by mouth daily. 90 tablet 3  . hydrocortisone 2.5 % cream as needed.   5   No current facility-administered medications on file prior to visit.     LABS/IMAGING: No results found for this or any previous visit (from the past 48 hour(s)). No results found.  LIPID PANEL:    Component Value Date/Time   CHOL 162 04/28/2017  0836   TRIG 96.0 04/28/2017 0836   TRIG 89 08/11/2006 0818   HDL 39.30 04/28/2017 0836   CHOLHDL 4 04/28/2017 0836   VLDL 19.2 04/28/2017 0836   LDLCALC 103 (H) 04/28/2017 0836   LDLDIRECT 145.3 07/12/2013 1046    WEIGHTS: Wt Readings from Last 3 Encounters:  03/25/18 223 lb 3.2 oz (101.2 kg)  03/05/18 220 lb (99.8 kg)  02/04/18 219 lb 9.6 oz (99.6 kg)    VITALS: BP 110/70   Pulse (!) 55   Ht 5\' 11"  (1.803 m)   Wt 223 lb 3.2 oz (101.2 kg)   BMI 31.13 kg/m   EXAM: General appearance: alert and no distress Neck: no carotid bruit, no JVD and thyroid not enlarged, symmetric, no tenderness/mass/nodules Lungs: clear to auscultation bilaterally Heart: regular rate and rhythm, S1, S2 normal, no murmur, click, rub or gallop Abdomen: soft, non-tender; bowel sounds normal; no masses,  no organomegaly and obese Extremities: extremities normal, atraumatic, no cyanosis or edema Pulses: 2+ and symmetric Skin: Skin color, texture, turgor normal. No rashes or lesions Neurologic: Grossly normal Psych: [place]  EKG: Sinus bradycardia 55-personally reviewed  ASSESSMENT: 1. Coronary artery disease status post PCI to the mid circumflex-Promus Premier 3.5 by 9mm DES (01/2017) 2. Alcohol abuse 3. Hypertension 4. Dyslipidemia 5. Obesity/Metabolic syndrome  PLAN: 1.   Charles Villa continues to be asymptomatic since stenting a year ago.  At this point I feel we can stop his Plavix and continue aspirin 81 mg daily.  I have encouraged him to continue to cut back on alcohol use if possible.  Blood pressure is well controlled today.  He will need a repeat lipid profile to target goal LDL less than 70.  Plan follow-up with me in 6 months or sooner as necessary.  Pixie Casino, MD, [facility], [place] Director of the [facility] Lipid Disorders &  Cardiovascular Risk [facility] Diplomate of the American Board of Clinical Lipidology Attending Cardiologist    Direct Dial: [phone]  Fax: [phone]  Website:  [email or web address] 03/25/2018, 3:00 PM

## 2018-03-25 NOTE — Patient Instructions (Signed)
Medication Instructions:   STOP clopidogrel (plavix)  Labwork:  FASTING lab work to check cholesterol  Testing/Procedures:  NONE  Follow-Up:  Your physician wants you to follow-up in: 6 months with Dr. Debara Pickett. You will receive a reminder letter in the mail two months in advance. If you don't receive a letter, please call our office to schedule the follow-up appointment.   If you need a refill on your cardiac medications before your next appointment, please call your pharmacy.  Any Other Special Instructions Will Be Listed Below (If Applicable).

## 2018-03-26 ENCOUNTER — Encounter: Payer: Self-pay | Admitting: Internal Medicine

## 2018-04-15 ENCOUNTER — Telehealth: Payer: Self-pay | Admitting: Family Medicine

## 2018-04-15 ENCOUNTER — Other Ambulatory Visit: Payer: Self-pay | Admitting: *Deleted

## 2018-04-15 MED ORDER — ROSUVASTATIN CALCIUM 40 MG PO TABS: 40.0000 mg | ORAL_TABLET | Freq: Every day | ORAL | 0 refills | Status: DC

## 2018-04-15 NOTE — Telephone Encounter (Signed)
Copied from [place] [id number]. Topic: Quick Communication - Rx Refill/Question >> Apr 15, 2018  2:23 PM Reyne Dumas L wrote: Medication: rosuvastatin (CRESTOR) 40 MG tablet  Has the patient contacted their pharmacy? Yes - states they need physician to call with new 90 day script (Agent: If no, request that the patient contact the pharmacy for the refill.) (Agent: If yes, when and what did the pharmacy advise?)  Preferred Pharmacy (with phone number or street name): [id number] - [place], [place]. AT [address] [phone] (Phone) [phone] (Fax)  Agent: Please be advised that RX refills may take up to 3 business days. We ask that you follow-up with your pharmacy.

## 2018-04-15 NOTE — Telephone Encounter (Signed)
Rx refilled per protocol- LOV: 03/05/18. Patient has appt- 05/05/18 for follow up.

## 2018-05-05 ENCOUNTER — Encounter: Payer: Self-pay | Admitting: Family Medicine

## 2018-05-05 ENCOUNTER — Ambulatory Visit (INDEPENDENT_AMBULATORY_CARE_PROVIDER_SITE_OTHER): Payer: Medicare HMO | Admitting: Family Medicine

## 2018-05-05 VITALS — BP 120/70 | HR 62 | Temp 97.8°F | Ht 71.0 in | Wt 228.4 lb

## 2018-05-05 DIAGNOSIS — Z23 Encounter for immunization: Secondary | ICD-10-CM | POA: Diagnosis not present

## 2018-05-05 DIAGNOSIS — Z125 Encounter for screening for malignant neoplasm of prostate: Secondary | ICD-10-CM | POA: Diagnosis not present

## 2018-05-05 DIAGNOSIS — F1021 Alcohol dependence, in remission: Secondary | ICD-10-CM

## 2018-05-05 DIAGNOSIS — Z0000 Encounter for general adult medical examination without abnormal findings: Secondary | ICD-10-CM | POA: Diagnosis not present

## 2018-05-05 DIAGNOSIS — E785 Hyperlipidemia, unspecified: Secondary | ICD-10-CM | POA: Diagnosis not present

## 2018-05-05 DIAGNOSIS — Z9861 Coronary angioplasty status: Secondary | ICD-10-CM | POA: Diagnosis not present

## 2018-05-05 DIAGNOSIS — K219 Gastro-esophageal reflux disease without esophagitis: Secondary | ICD-10-CM

## 2018-05-05 DIAGNOSIS — I2510 Atherosclerotic heart disease of native coronary artery without angina pectoris: Secondary | ICD-10-CM

## 2018-05-05 DIAGNOSIS — R69 Illness, unspecified: Secondary | ICD-10-CM | POA: Diagnosis not present

## 2018-05-05 NOTE — Assessment & Plan Note (Signed)
CAD- shortness of breath improved after stenting. Doing ok with lawnmower and activity post stent and SOB is much better

## 2018-05-05 NOTE — Addendum Note (Signed)
Addended by: Mariam Dollar, Roselyn Reef M on: 05/05/2018 03:19 PM   Modules accepted: Orders

## 2018-05-05 NOTE — Patient Instructions (Addendum)
Health Maintenance Due  Topic Date Due  . INFLUENZA VACCINE -today  02/26/2018   No changes in medication today  Please stop by lab before you go

## 2018-05-05 NOTE — Assessment & Plan Note (Signed)
Hyperlipidemia- on rosuvastatin 40mg , update lipid panel

## 2018-05-05 NOTE — Progress Notes (Signed)
Phone: [phone]  Subjective:  Patient presents today for their annual physical. Chief complaint-noted.   See problem oriented charting- ROS- full  review of systems was completed and negative except for some shortness of breath but much improved, rashes from psoriasis, post nasal drip in AM, minor abdominal pain, minor aches Iin hands  The following were reviewed and entered/updated in epic: Past Medical History:  Diagnosis Date  . Anal fissure 08/17/2012   Really more gluteal crease irritation- protosol hc 2.5% prn   . BACK PAIN, CHRONIC 02/10/2007  . CAD (coronary artery disease), native coronary artery    02/24/17-PCI/DES to mLcx, normal EF.  . Diverticulosis of colon    2007  . GERD 02/23/2007  . GERD (gastroesophageal reflux disease)   . Hyperlipidemia   . Hypertension   . OCD (obsessive compulsive disorder) 01/23/2016   Ocd/hoarding issues- psychiatristis- [place] started on zoloft 100mg --> paxil  . Personal history of colonic polyps 03/14/2014   08/2005. Benign. 10 year repeat. 2010 colonoscopy in record was entered erroneously.    [name] PSORIASIS 02/10/2007   Topical therapy.     . Scalp psoriasis   . SQUAMOUS CELL CARCINOMA OF SKIN SITE UNSPECIFIED 08/24/2010   Annotation: face    Patient Active Problem List   Diagnosis Date Noted  . CAD S/P percutaneous coronary angioplasty 02/24/2017    Priority: High  . Alcoholism in recovery ([place]) 02/03/2017    Priority: High  . Thiamine deficiency 04/28/2017    Priority: Medium  . OCD (obsessive compulsive disorder) 01/23/2016    Priority: Medium  . Essential hypertension 03/11/2014    Priority: Medium  . Hyperlipidemia with target low density lipoprotein (LDL) cholesterol less than 70 mg/dL 02/10/2007    Priority: Medium  . PSORIASIS 02/10/2007    Priority: Medium  . Personal history of colonic polyps 03/14/2014    Priority: Low  . Anal fissure 08/17/2012    Priority: Low  . SQUAMOUS CELL CARCINOMA OF SKIN SITE UNSPECIFIED  08/24/2010    Priority: Low  . GERD 02/23/2007    Priority: Low  . BACK PAIN, CHRONIC 02/10/2007    Priority: Low  . Pure hypercholesterolemia 03/17/2017  . Exertional dyspnea 02/24/2017  . Angina of effort ([place]) 02/24/2017  . S/P cardiac catheterization 02/24/2017  . Coronary artery disease involving native coronary artery of native heart with angina pectoris ([place])   . Abnormal cardiovascular stress test 02/20/2017  . Other fatigue 02/20/2017  . Shortness of breath 02/03/2017  . Tremor 05/06/2016   Past Surgical History:  Procedure Laterality Date  . COLONOSCOPY W/ POLYPECTOMY     2007 [name]  . CORONARY STENT INTERVENTION N/A 02/24/2017   Procedure: Coronary Stent Intervention;  Surgeon: Leonie Man, MD;  Location: [facility];  Service: Cardiovascular;  Laterality: N/A;  . LEFT HEART CATH AND CORONARY ANGIOGRAPHY N/A 02/24/2017   Procedure: Left Heart Cath and Coronary Angiography;  Surgeon: Leonie Man, MD;  Location: [facility];  Service: Cardiovascular;  Laterality: N/A;  . SKIN CANCER EXCISION     squamous cell CA - nose  . TONSILLECTOMY      Family History  Problem Relation Age of Onset  . Lung cancer Father   . Stroke Mother   . Hypertension Mother     Medications- reviewed and updated Current Outpatient Medications  Medication Sig Dispense Refill  . aspirin EC 81 MG tablet Take 81 mg by mouth daily.    [name] b complex vitamins capsule Take  1 capsule by mouth daily.    . bisoprolol-hydrochlorothiazide (ZIAC) 2.5-6.25 MG tablet Take 1 tablet by mouth daily. 90 tablet 3  . clobetasol (TEMOVATE) 0.05 % external solution as needed.  5  . clobetasol cream (TEMOVATE) 0.05 % as needed.  3  . clotrimazole-betamethasone (LOTRISONE) cream Apply 1 application topically 2 (two) times daily as needed. psoriass    . [employer] OMEGA-3 KRILL OIL 300 MG CAPS Take 1 tablet by mouth every other day.    [name] desonide (DESONATE) 0.05 % gel Apply 1 application topically  2 (two) times daily as needed. Skin lesions    . fluocinolone (VANOS) 0.01 % cream Apply 1 application topically daily as needed.     . hydrocortisone (ANUSOL-HC) 2.5 % rectal cream Place 1 application rectally 2 (two) times daily as needed for hemorrhoids or anal itching.    . hydrocortisone 2.5 % cream as needed.   5  . nitroGLYCERIN (NITROSTAT) 0.4 MG SL tablet Place 1 tablet (0.4 mg total) under the tongue every 5 (five) minutes as needed. 25 tablet 3  . pantoprazole (PROTONIX) 40 MG tablet TAKE 1 TABLET BY MOUTH EVERY DAY 90 tablet 3  . Probiotic Product (PROBIOTIC DAILY PO) Take by mouth every other day.     . rosuvastatin (CRESTOR) 40 MG tablet Take 1 tablet (40 mg total) by mouth daily. 90 tablet 0  . silver sulfADIAZINE (SILVADENE) 1 % cream as needed.   5   No current facility-administered medications for this visit.     Allergies-reviewed and updated No Known Allergies  Social History   Social History Narrative   Married 42 years in 2015. 2 kids, 1 grandchild that is 25.63 years old.       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       Hobbies: ride bike, ski in winter     Objective: BP 120/70 (BP Location: Left Arm, Patient Position: Sitting, Cuff Size: Large)   Pulse 62   Temp 97.8 F (36.6 C) (Oral)   Ht 5\' 11"  (1.803 m)   Wt 228 lb 6.4 oz (103.6 kg)   SpO2 95%   BMI 31.86 kg/m  Gen: NAD, resting comfortably HEENT: Mucous membranes are moist. Oropharynx normal Neck: no thyromegaly CV: RRR no murmurs rubs or gallops Lungs: CTAB no crackles, wheeze, rhonchi Abdomen: soft/nontender/nondistended/normal bowel sounds. No rebound or guarding.  Ext: no edema Skin: warm, dry, multiple silvery plaques noted on skin- largest noted on lower back.  Neuro: grossly normal, moves all extremities, PERRLA Rectal: normal tone, normal sized prostate, no masses or tenderness Near rectum- some white cream noted that he used today.    Assessment/Plan:  72 y.o. male presenting for annual physical.  Health Maintenance counseling: 1. Anticipatory guidance: Patient counseled regarding regular dental exams -q6 months, eye exams -yearly, wearing seatbelts.  2. Risk factor reduction:  Advised patient of need for regular exercise and diet rich and fruits and vegetables to reduce risk of heart attack and stroke. Exercise- not exercising regularly, encouraged this. Diet-weight trending up since august- knows he needs to work on discipline.  Wt Readings from Last 3 Encounters:  05/05/18 228 lb 6.4 oz (103.6 kg)  03/25/18 223 lb 3.2 oz (101.2 kg)  03/05/18 220 lb (99.8 kg)  3. Immunizations/screenings/ancillary studies- flu shot today .  Immunization History  Administered Date(s) Administered  . DTP 08/10/2007  . Influenza Split 06/08/2012  . Influenza Whole 08/21/2009  . Influenza, High Dose Seasonal PF  05/12/2013, 05/06/2016  . Influenza, Seasonal, Injecte, Preservative Fre 05/23/2013  . Influenza-Unspecified 05/01/2015  . Pneumococcal Conjugate-13 08/30/2013  . Pneumococcal Polysaccharide-23 03/11/2014  . Td 07/29/1998, 08/15/2008  . Tdap 10/07/2016  4. Prostate cancer screening- low risk PSA trend. USPTF guidelines suggest 76-47 years old.  Low risk rectal exam Lab Results  Component Value Date   PSA 0.40 08/04/2015   PSA 0.44 03/07/2014   PSA 0.46 01/04/2013   5. Colon cancer screening - 09/2015 with 3 year repeat- due early next year 6. Skin cancer screening- no recent dermatologist. advised regular sunscreen use. Denies worrisome, changing, or new skin lesions.   Status of chronic or acute concerns   Constipation- still with some occasional issues and can cause abdominal pain- LLQ and if has good BM resolves.   Hypertension- controlled on ziac 2.5-6.25mg    Psoriasis- several scaly patches- not always the most consistent with creams. lotrisone helps in groin. On fluocinolone for other areas  Anal fissure- not  bothering him recently. Can cause foul smells at times if irritated  Hyperlipidemia with target low density lipoprotein (LDL) cholesterol less than 70 mg/dL Hyperlipidemia- on rosuvastatin 40mg , update lipid panel  Alcoholism in recovery ([place]) Alcoholism in recovery- still drinking 1-2 drinks a day, has 3rd a day at times now- encouraged complete cessation. AA doesn't appeal to him. He doesn't want to quit. encouragd him to cut back with volume of drinking slipping up- he states will consider   CAD S/P percutaneous coronary angioplasty CAD- shortness of breath improved after stenting. Doing ok with lawnmower and activity post stent and SOB is much better  GERD gerd- on 40mg  protonix- wants to trial every other day I/o 20mg - reasonable to trial- he states has enough supply at present   Future Appointments  Date Time Provider [place]  02/08/2019  2:00 PM [facility]   Lab/Order associations: not  Annual physical exam - Plan: Comprehensive metabolic panel, TSH, CBC with Differential/Platelet, Lipid panel  Hyperlipidemia with target low density lipoprotein (LDL) cholesterol less than 70 mg/dL  Alcoholism in recovery ([place])  CAD S/P percutaneous coronary angioplasty  Gastroesophageal reflux disease, esophagitis presence not specified  Return precautions advised.  Garret Reddish, MD

## 2018-05-05 NOTE — Assessment & Plan Note (Signed)
Alcoholism in recovery- still drinking 1-2 drinks a day, has 3rd a day at times now- encouraged complete cessation. AA doesn't appeal to him. He doesn't want to quit. encouragd him to cut back with volume of drinking slipping up- he states will consider

## 2018-05-05 NOTE — Assessment & Plan Note (Signed)
gerd- on 40mg  protonix- wants to trial every other day I/o 20mg - reasonable to trial- he states has enough supply at present

## 2018-05-06 LAB — LIPID PANEL
CHOL/HDL RATIO: 2
CHOLESTEROL: 173 mg/dL (ref 0–200)
HDL: 70.3 mg/dL (ref >39.00)
NonHDL: 102.43
TRIGLYCERIDES: 305 mg/dL — AB (ref 0.0–149.0)
VLDL: 61 mg/dL — AB (ref 0.0–40.0)

## 2018-05-06 LAB — CBC WITH DIFFERENTIAL/PLATELET
Basophils Absolute: 0.1 10*3/uL (ref 0.0–0.1)
Basophils Relative: 1.8 % (ref 0.0–3.0)
EOS ABS: 0.1 10*3/uL (ref 0.0–0.7)
EOS PCT: 1.6 % (ref 0.0–5.0)
HCT: 43.3 % (ref 39.0–52.0)
HEMOGLOBIN: 14.9 g/dL (ref 13.0–17.0)
Lymphocytes Relative: 32.1 % (ref 12.0–46.0)
Lymphs Abs: 1.9 10*3/uL (ref 0.7–4.0)
MCHC: 34.3 g/dL (ref 30.0–36.0)
MCV: 101.7 fl — ABNORMAL HIGH (ref 78.0–100.0)
MONO ABS: 0.6 10*3/uL (ref 0.1–1.0)
Monocytes Relative: 9.8 % (ref 3.0–12.0)
Neutro Abs: 3.3 10*3/uL (ref 1.4–7.7)
Neutrophils Relative %: 54.7 % (ref 43.0–77.0)
Platelets: 152 10*3/uL (ref 150.0–400.0)
RBC: 4.26 Mil/uL (ref 4.22–5.81)
RDW: 13.2 % (ref 11.5–15.5)
WBC: 6 10*3/uL (ref 4.0–10.5)

## 2018-05-06 LAB — COMPREHENSIVE METABOLIC PANEL
ALT: 30 U/L (ref 0–53)
AST: 39 U/L — ABNORMAL HIGH (ref 0–37)
Albumin: 4.1 g/dL (ref 3.5–5.2)
Alkaline Phosphatase: 40 U/L (ref 39–117)
BUN: 14 mg/dL (ref 6–23)
CO2: 24 meq/L (ref 19–32)
Calcium: 9.1 mg/dL (ref 8.4–10.5)
Chloride: 103 mEq/L (ref 96–112)
Creatinine, Ser: 1.07 mg/dL (ref 0.40–1.50)
GFR: 72.52 mL/min (ref >60.00)
GLUCOSE: 97 mg/dL (ref 70–99)
POTASSIUM: 4.1 meq/L (ref 3.5–5.1)
SODIUM: 138 meq/L (ref 135–145)
TOTAL PROTEIN: 6.8 g/dL (ref 6.0–8.3)
Total Bilirubin: 0.5 mg/dL (ref 0.2–1.2)

## 2018-05-06 LAB — LDL CHOLESTEROL, DIRECT: Direct LDL: 76 mg/dL

## 2018-05-06 LAB — PSA: PSA: 0.44 ng/mL (ref 0.10–4.00)

## 2018-05-25 ENCOUNTER — Ambulatory Visit: Payer: Medicare HMO | Admitting: Psychiatry

## 2018-05-25 ENCOUNTER — Encounter: Payer: Self-pay | Admitting: Psychiatry

## 2018-05-25 DIAGNOSIS — R69 Illness, unspecified: Secondary | ICD-10-CM | POA: Diagnosis not present

## 2018-05-25 DIAGNOSIS — F423 Hoarding disorder: Secondary | ICD-10-CM | POA: Diagnosis not present

## 2018-05-25 DIAGNOSIS — F1010 Alcohol abuse, uncomplicated: Secondary | ICD-10-CM

## 2018-05-25 MED ORDER — NALTREXONE HCL 50 MG PO TABS: 50.0000 mg | ORAL_TABLET | Freq: Every day | ORAL | 3 refills | Status: DC

## 2018-05-25 MED ORDER — NALTREXONE HCL 50 MG PO TABS: 50.0000 mg | ORAL_TABLET | Freq: Every day | ORAL | 11 refills | Status: DC

## 2018-05-25 NOTE — Progress Notes (Signed)
[facility]  Patient ID: Charles Villa,  MRN: [id number]  PCP: Marin Olp, MD  Date of Evaluation: 05/25/2018 Time spent:25 minutes  Chief Complaint:  Chief Complaint    Follow-up; hoarding      HISTORY/CURRENT STATUS: HPI  Stopped bupropion DT shakes and didn't help motivation.  Shakes better. Also Stopped naltrexone on his own.  No +/- changes off of it.. Patient reports stable mood and denies depressed or irritable moods.  Patient denies any recent difficulty with anxiety.  Patient denies difficulty with sleep initiation or maintenance.Stays up too late watching tv but seems ok during the day.  Denies appetite disturbance.  Patient reports that energy and motivation have been fair.  Patient denies any difficulty with concentration.  Patient denies any suicidal ideation. Probably drinking more off naltrexone and wants to return to it.  W on his case a lot about being lazy and lethargic and his hoarding.  Was forced to rid some things from garage..I guess I"m OK with that so some forced progress, but not motivated to do much more.  Falied Pscyh meds: Luvox, Zoloft, Paxil 40, Wellbutrin,  Individual Medical History/ Review of Systems: Changes? :cardiomyopathy stable.  Allergies: Patient has no known allergies.  Current Medications:  Current Outpatient Medications:  .  aspirin EC 81 MG tablet, Take 81 mg by mouth daily., Disp: , Rfl:  .  b complex vitamins capsule, Take 1 capsule by mouth daily., Disp: , Rfl:  .  bisoprolol-hydrochlorothiazide (ZIAC) 2.5-6.25 MG tablet, Take 1 tablet by mouth daily., Disp: 90 tablet, Rfl: 3 .  [employer] OMEGA-3 KRILL OIL 300 MG CAPS, Take 1 tablet by mouth every other day., Disp: , Rfl:  .  pantoprazole (PROTONIX) 40 MG tablet, TAKE 1 TABLET BY MOUTH EVERY DAY, Disp: 90 tablet, Rfl: 3 .  Probiotic Product (PROBIOTIC DAILY PO), Take by mouth every other day. , Disp: , Rfl:  .  rosuvastatin (CRESTOR) 40 MG tablet, Take 1 tablet (40  mg total) by mouth daily., Disp: 90 tablet, Rfl: 0 .  clobetasol (TEMOVATE) 0.05 % external solution, as needed., Disp: , Rfl: 5 .  clobetasol cream (TEMOVATE) 0.05 %, as needed., Disp: , Rfl: 3 .  clotrimazole-betamethasone (LOTRISONE) cream, Apply 1 application topically 2 (two) times daily as needed. psoriass, Disp: , Rfl:  .  desonide (DESONATE) 0.05 % gel, Apply 1 application topically 2 (two) times daily as needed. Skin lesions, Disp: , Rfl:  .  fluocinolone (VANOS) 0.01 % cream, Apply 1 application topically daily as needed. , Disp: , Rfl:  .  hydrocortisone (ANUSOL-HC) 2.5 % rectal cream, Place 1 application rectally 2 (two) times daily as needed for hemorrhoids or anal itching., Disp: , Rfl:  .  hydrocortisone 2.5 % cream, as needed. , Disp: , Rfl: 5 .  nitroGLYCERIN (NITROSTAT) 0.4 MG SL tablet, Place 1 tablet (0.4 mg total) under the tongue every 5 (five) minutes as needed. (Patient not taking: Reported on 05/25/2018), Disp: 25 tablet, Rfl: 3 .  silver sulfADIAZINE (SILVADENE) 1 % cream, as needed. , Disp: , Rfl: 5 Medication Side Effects: none  Family Medical/ Social History: Changes? No  Alcohol 2-4/day but goal is under 3.  MENTAL HEALTH EXAM:  There were no vitals taken for this visit.There is no height or weight on file to calculate BMI.  General Appearance: Casual  Eye Contact:  Good  Speech:  Normal Rate  Volume:  Normal  Mood:  Euthymic  Affect:  Congruent and Constricted  Thought Process:  Goal Directed  Orientation:  Full (Time, Place, and Person)  Thought Content: Logical   Suicidal Thoughts:  No  Homicidal Thoughts:  No  Memory:  Immediate;   Good Recent;   Good Remote;   Good  Judgement:  Fair  Insight:  Fair  Psychomotor Activity:  Normal  Concentration:  Concentration: Good  Recall:  Good  Fund of Knowledge: Good  Language: Good  Assets:  Financial Resources/Insurance Housing Leisure Time Herbalist Vocational/Educational   ADL's:  Intact  Cognition: WNL  Prognosis:  Fair    DIAGNOSES:    ICD-10-CM   1. Hoarding disorder with absent insight or delusional beliefs F42.3   2. Excessive drinking alcohol F10.10     Receiving Psychotherapy: No Had been in the last couple of years with minimal progress.   RECOMMENDATIONS:  Greater than 50% of face to face time with patient was spent on counseling and coordination of care. We discussed lack of progress with meds for hoarding so he has weaned off. Ideal therapy for hoarding is intense CBT, but he does not feel the need to do anymore of that at this time.  Restart Naltrexone for alcohol excess.  Disc substance use issues in detail.  Need to keep 3 or less to avoid impairment.  No driving with alcohol.  FU as needed. Purnell Shoemaker, MD

## 2018-06-11 IMAGING — DX DG CHEST 2V
2 series · 2 of 2 positions shown · non-contrast
Comparison: No prior .

CLINICAL DATA: Pre cardiac catheterization evaluation .

EXAM:
CHEST  2 VIEW

[dg chest 2 view (1 of 2)]
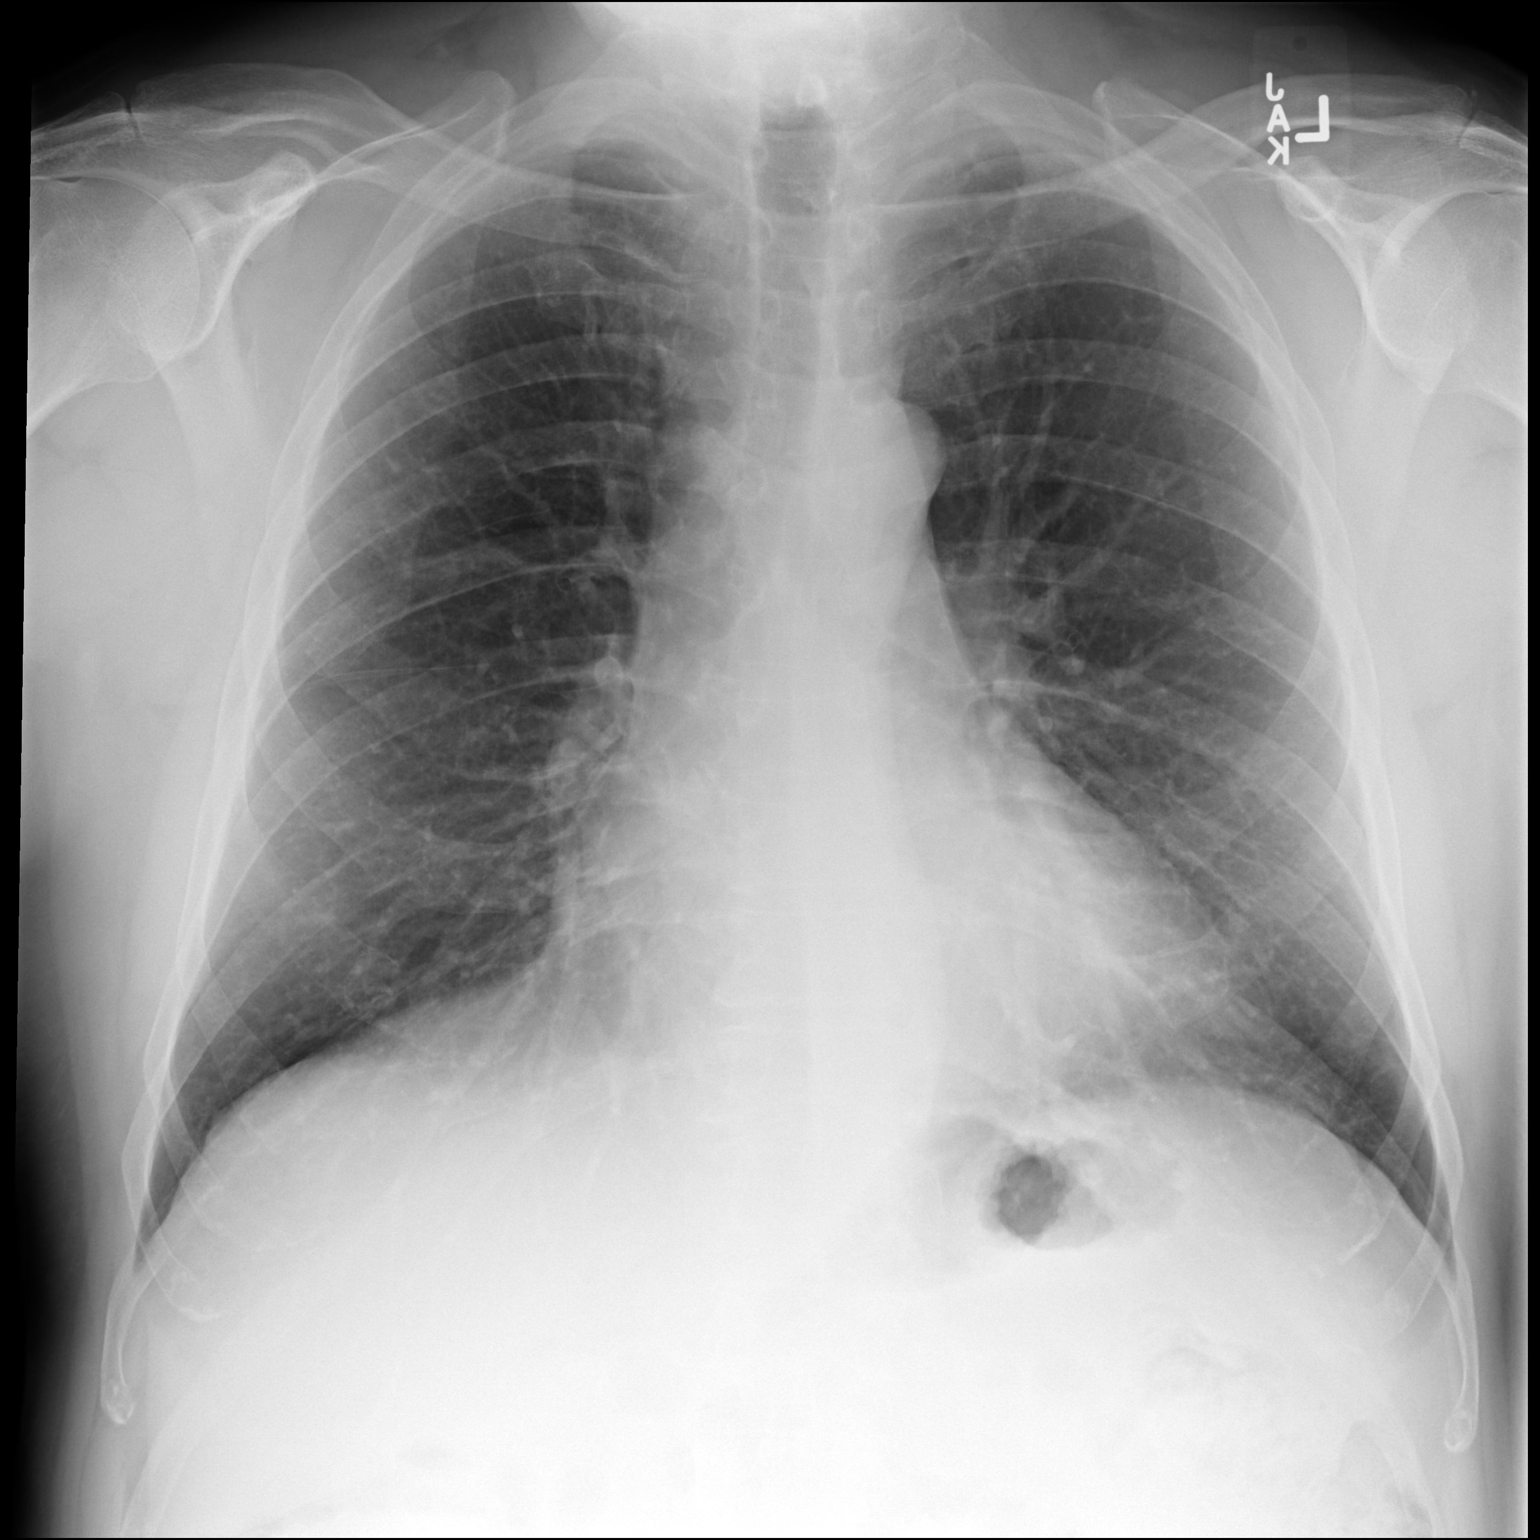

[dg chest 2 view (2 of 2)]
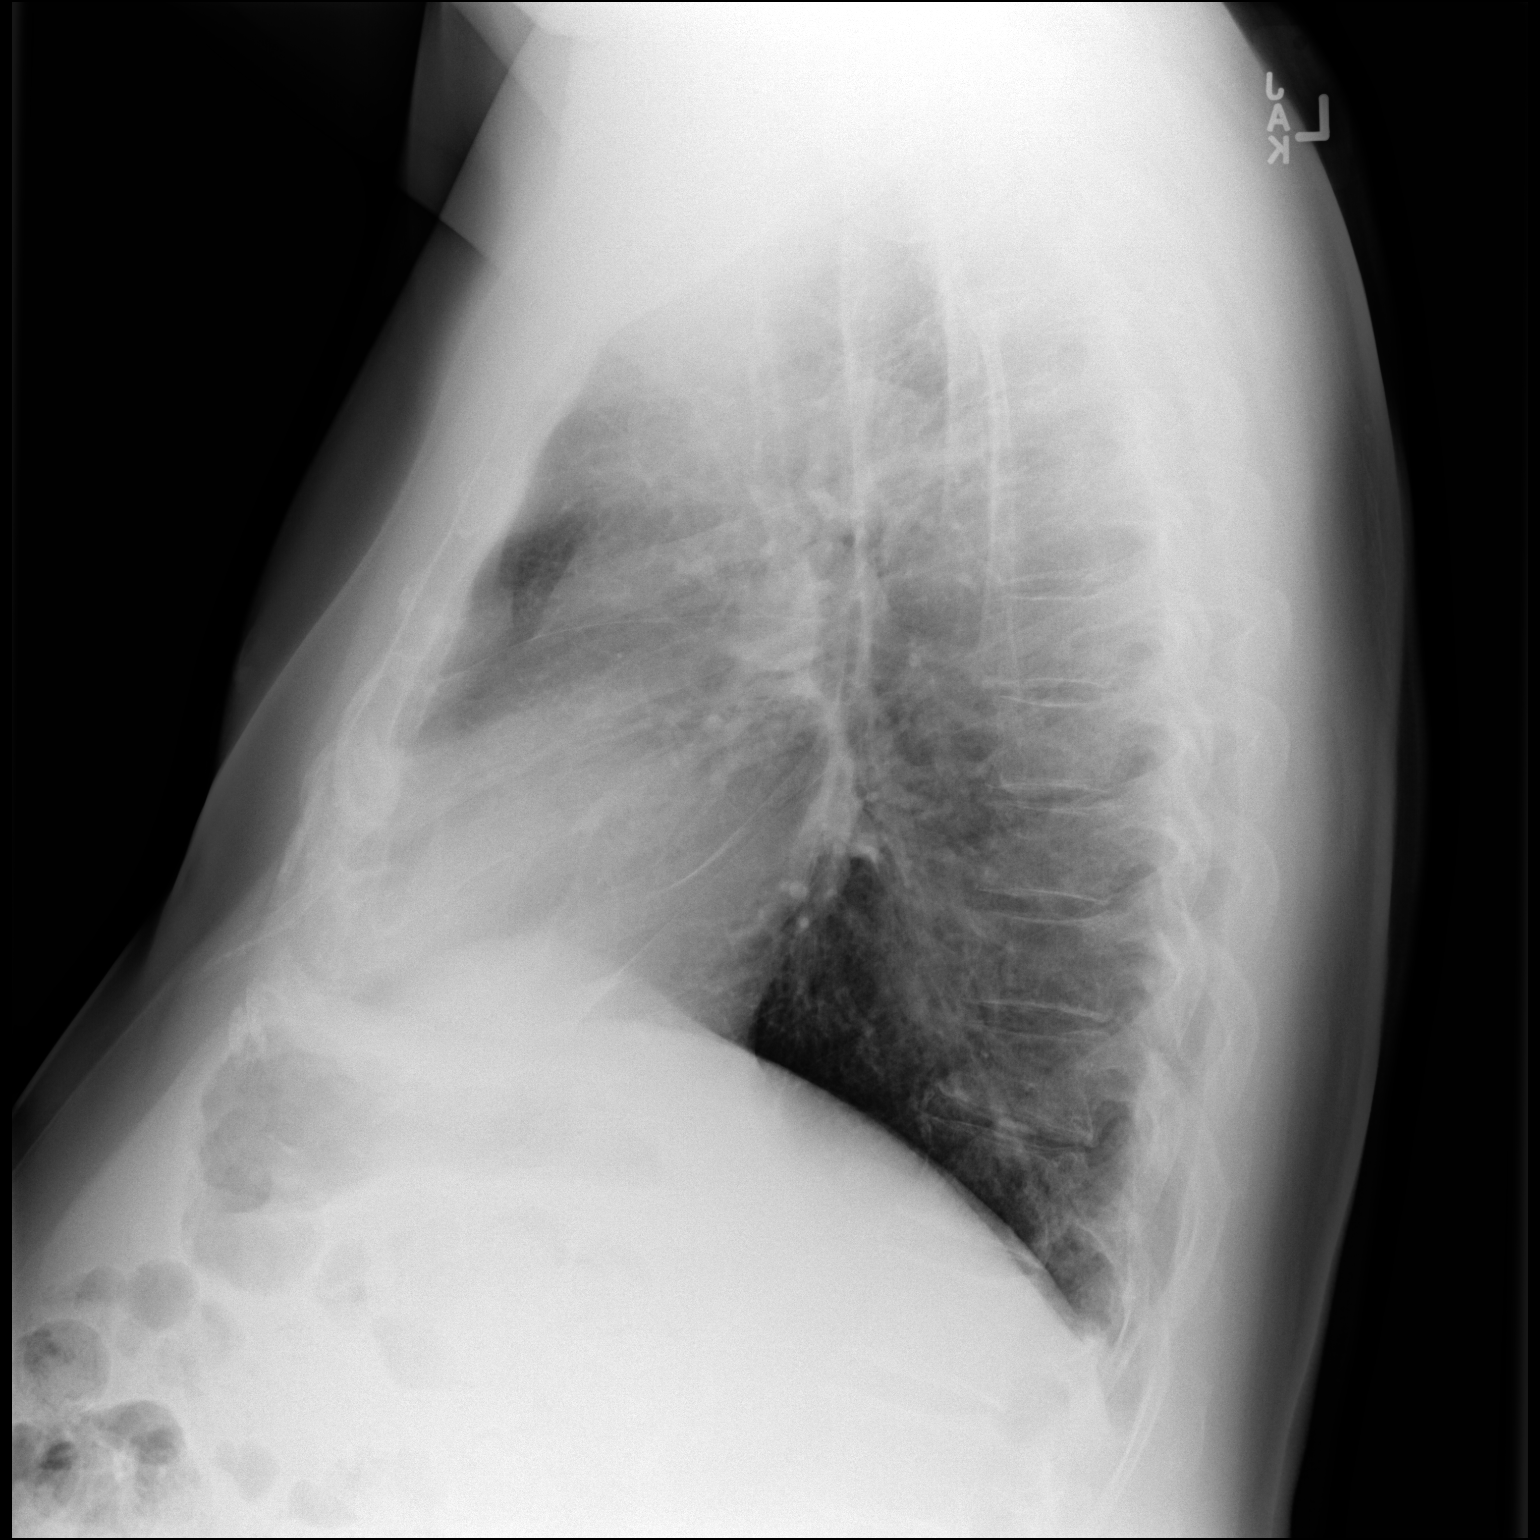

[2 of 2 positions shown; findings below may reference images not displayed]

FINDINGS: Mediastinum and hilar structures normal. Cardiomegaly with normal
pulmonary vascularity. No focal infiltrate. No pleural effusion or
pneumothorax. No acute bony abnormality .
IMPRESSION: Cardiomegaly. No pulmonary venous congestion. No acute pulmonary
disease.

## 2018-06-14 ENCOUNTER — Other Ambulatory Visit: Payer: Self-pay | Admitting: Internal Medicine

## 2018-07-18 ENCOUNTER — Other Ambulatory Visit: Payer: Self-pay | Admitting: Family Medicine

## 2018-08-13 DIAGNOSIS — L400 Psoriasis vulgaris: Secondary | ICD-10-CM | POA: Diagnosis not present

## 2018-08-19 ENCOUNTER — Other Ambulatory Visit: Payer: Self-pay | Admitting: Family Medicine

## 2018-08-25 DIAGNOSIS — R69 Illness, unspecified: Secondary | ICD-10-CM | POA: Diagnosis not present

## 2018-08-31 DIAGNOSIS — R69 Illness, unspecified: Secondary | ICD-10-CM | POA: Diagnosis not present

## 2018-09-22 ENCOUNTER — Encounter: Payer: Self-pay | Admitting: Internal Medicine

## 2018-09-22 ENCOUNTER — Ambulatory Visit: Payer: Medicare HMO | Admitting: Internal Medicine

## 2018-09-22 VITALS — BP 122/66 | HR 61 | Ht 71.0 in | Wt 229.0 lb

## 2018-09-22 DIAGNOSIS — E785 Hyperlipidemia, unspecified: Secondary | ICD-10-CM

## 2018-09-22 DIAGNOSIS — I2510 Atherosclerotic heart disease of native coronary artery without angina pectoris: Secondary | ICD-10-CM | POA: Diagnosis not present

## 2018-09-22 DIAGNOSIS — F1010 Alcohol abuse, uncomplicated: Secondary | ICD-10-CM

## 2018-09-22 DIAGNOSIS — E781 Pure hyperglyceridemia: Secondary | ICD-10-CM

## 2018-09-22 DIAGNOSIS — Z9861 Coronary angioplasty status: Secondary | ICD-10-CM

## 2018-09-22 DIAGNOSIS — R69 Illness, unspecified: Secondary | ICD-10-CM | POA: Diagnosis not present

## 2018-09-22 MED ORDER — ICOSAPENT ETHYL 1 G PO CAPS: ORAL_CAPSULE | ORAL | 6 refills | Status: DC

## 2018-09-22 NOTE — Progress Notes (Signed)
OFFICE CONSULT NOTE  Chief Complaint:  Routine follow-up  Primary Care Physician: Marin Olp, MD  HPI:  Charles Villa is a 73 y.o. male who is being seen today for the evaluation of shortness of breath at the request of Marin Olp, MD. Charles Villa has a history of hypertension, dyslipidemia, GERD, chronic back pain, OCD and long-standing alcohol use, according to his wife who did most the talking during the visit he drinks about 2-3 drinks per day however could be more. She says that's increased more recently. She notes that over the past 2 years she's had progressively worsening shortness of breath. 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 They have a vacation house at [facility] and he has approximate 65 steps to climb up. He says he gets short of breath walking up the stairs and has been more difficult to do that without stopping. He seems to minimize his symptoms. His wife is concerned about possible coronary artery disease as a cause of this. She used to work in cardiac rehabilitation, possibly is an Physiological scientist. There is a family history of hypertension and stroke in his mother and his father who was a smoker died of lung cancer. He denies any chest pain or pressure, palpitations, presyncope or syncopal symptoms. He also denies any fever, chills, cough or other infective symptoms.  02/20/2017  Charles Villa returns today for follow-up. He underwent echocardiogram as well as an exercise treadmill stress test. The exercise treadmill stress test unfortunately was abnormal indicating ST segment depression in leads 23 aVF and V5 and V6 beginning at 5 minutes a stress returning to baseline after about 5-9 minutes of recovery. His echocardiogram demonstrated normal systolic function, mild diastolic dysfunction and some mild valvular abnormalities. I reviewed the results for him today along with his wife and my recommendations for left  heart catheterization.  03/17/2017  Charles Villa was seen today in follow-up. He underwent a graded exercise tolerance test which was abnormal indicating ST segment changes concerning for ischemia. He was subsequently referred for cardiac catheterization. That was performed on 02/24/2017 by Dr. Ellyn Hack which indicated a 90% mid circumflex lesion. This was stented with a Promus Premier 3.5 x 16 mm drug-eluting stent. It was noted that there was moderate disease in the ostial and mid LAD.[name] LVEF was 55-65% with normal LV and diastolic pressure. After the procedure he reports an improvement in his breathing however he does not feel that he is back to "baseline". I explained him that there may be certainly other factors causing him to be short of breath including his significant weight gain and abdominal obesity. We also discussed the fact that he is on dual antiplatelet therapy and that continued alcohol use is not advisable. Fortunately he is agreed to cardiac rehabilitation. I feel that will help with both weight management and cardiac vascular conditioning.  09/09/2017  Charles Villa was seen today in follow-up.  He continues to do well.  He completed cardiac rehabilitation.  He denies any chest pain or worsening shortness of breath.  He does feel some fatigue and blood pressure was noted to be low today 94/62.  He said that is unusual as typically his blood pressures around 710 626 systolic.  EKG shows sinus rhythm with no ischemia today.  He is on dual antiplatelet therapy with aspirin and Plavix which we will plan to continue for 6 months.  03/25/2018  Charles Villa returns today for follow-up.  Overall he  reports feeling well and is generally asymptomatic.  He says he is able to walk up and down the hill to [place] at [facility] now without much difficulty.  He mentioned that he has been able to cut back on his alcohol somewhat but has not quit completely.  He is tolerating his heart  failure medicines.  Blood pressure is excellent today 110/70.  He is now a year out from stenting to the mid circumflex artery.  He continues on aspirin and Plavix.  09/22/2018  Charles Villa is seen today in follow-up.  Recently says that his alcohol intake is gone up a little more.  He is now about 3 drinks a day.  He denies any worsening shortness of breath.  His most recent lipid profile showed a total cholesterol 173, triglycerides 305, HDL 70 and LDL direct of 76.  This is on 40 of rosuvastatin.  We discussed recent trial data from REDUCE-IT study indicating that Vascepa (icosapent ethyl) showed significant cardiovascular risk reduction in patients elevated triglycerides persistently at goal LDL between 150 and 500.  I think he is an ideal candidate for this medication.  Of course alcohol is also likely contributing to his elevated triglycerides as well.  PMHx:  Past Medical History:  Diagnosis Date  . Anal fissure 08/17/2012   Really more gluteal crease irritation- protosol hc 2.5% prn   . BACK PAIN, CHRONIC 02/10/2007  . CAD (coronary artery disease), native coronary artery    02/24/17-PCI/DES to mLcx, normal EF.  . Diverticulosis of colon    2007  . GERD 02/23/2007  . GERD (gastroesophageal reflux disease)   . Hyperlipidemia   . Hypertension   . OCD (obsessive compulsive disorder) 01/23/2016   Ocd/hoarding issues- psychiatristis- [place]le started on zoloft 100mg --> paxil  . Personal history of colonic polyps 03/14/2014   08/2005. Benign. 10 year repeat. 2010 colonoscopy in record was entered erroneously.    [name] PSORIASIS 02/10/2007   Topical therapy.     . Scalp psoriasis   . SQUAMOUS CELL CARCINOMA OF SKIN SITE UNSPECIFIED 08/24/2010   Annotation: face     Past Surgical History:  Procedure Laterality Date  . COLONOSCOPY W/ POLYPECTOMY     2007 [name]  . CORONARY STENT INTERVENTION N/A 02/24/2017   Procedure: Coronary Stent Intervention;  Surgeon: Leonie Man, MD;  Location: [place] [facility];  Service: Cardiovascular;  Laterality: N/A;  . LEFT HEART CATH AND CORONARY ANGIOGRAPHY N/A 02/24/2017   Procedure: Left Heart Cath and Coronary Angiography;  Surgeon: Leonie Man, MD;  Location: [place] [facility];  Service: Cardiovascular;  Laterality: N/A;  . SKIN CANCER EXCISION     squamous cell CA - nose  . TONSILLECTOMY      FAMHx:  Family History  Problem Relation Age of Onset  . Lung cancer Father   . Stroke Mother   . Hypertension Mother     SOCHx:   reports that he has never smoked. He has never used smokeless tobacco. He reports current alcohol use of about 21.0 standard drinks of alcohol per week. He reports that he does not use drugs.  ALLERGIES:  No Known Allergies  ROS: Pertinent items noted in HPI and remainder of comprehensive ROS otherwise negative.  HOME MEDS: Current Outpatient Medications on File Prior to Visit  Medication Sig Dispense Refill  . aspirin EC 81 MG tablet Take 81 mg by mouth daily.    [name] b complex vitamins capsule Take 1  capsule by mouth daily.    . bisoprolol-hydrochlorothiazide (ZIAC) 2.5-6.25 MG tablet TAKE 1 TABLET BY MOUTH EVERY DAY 90 tablet 3  . clobetasol (TEMOVATE) 0.05 % external solution Apply 1 application topically at bedtime as needed (scalp).   5  . clobetasol cream (TEMOVATE) 4.65 % Apply 1 application topically 2 (two) times daily as needed (abdomen).   3  . clotrimazole-betamethasone (LOTRISONE) cream Apply 1 application topically 2 (two) times daily as needed. psoriass    . [employer] OMEGA-3 KRILL OIL 300 MG CAPS Take 1 tablet by mouth every other day.    [name] desonide (DESONATE) 0.05 % gel Apply 1 application topically 2 (two) times daily as needed. Skin lesions    . fluocinolone (VANOS) 0.01 % cream Apply 1 application topically daily as needed.     . hydrocortisone (ANUSOL-HC) 2.5 % rectal cream Place 1 application rectally 2 (two) times daily as needed for hemorrhoids or anal itching.    . hydrocortisone 2.5 %  cream Apply 1 application topically 2 (two) times daily as needed (face).   5  . naltrexone (DEPADE) 50 MG tablet Take 1 tablet (50 mg total) by mouth daily. 90 tablet 3  . nitroGLYCERIN (NITROSTAT) 0.4 MG SL tablet Place 1 tablet (0.4 mg total) under the tongue every 5 (five) minutes as needed. 25 tablet 3  . Probiotic Product (PROBIOTIC DAILY PO) Take 1 tablet by mouth 3 (three) times a week. Tuesdays, Thursdays, and Saturdays    . rosuvastatin (CRESTOR) 40 MG tablet TAKE 1 TABLET BY MOUTH EVERY DAY 90 tablet 1  . silver sulfADIAZINE (SILVADENE) 1 % cream Apply 1 application topically 2 (two) times daily as needed (groin).   5  . pantoprazole (PROTONIX) 40 MG tablet TAKE 1 TABLET BY MOUTH EVERY DAY (Patient taking differently: Take 40 mg by mouth 3 (three) times a week. Mondays, Wednesdays, and Fridays) 90 tablet 3   No current facility-administered medications on file prior to visit.     LABS/IMAGING: No results found for this or any previous visit (from the past 48 hour(s)). No results found.  LIPID PANEL:    Component Value Date/Time   CHOL 173 05/05/2018 1438   TRIG 305.0 (H) 05/05/2018 1438   TRIG 89 08/11/2006 0818   HDL 70.30 05/05/2018 1438   CHOLHDL 2 05/05/2018 1438   VLDL 61.0 (H) 05/05/2018 1438   LDLCALC 103 (H) 04/28/2017 0836   LDLDIRECT 76.0 05/05/2018 1438    WEIGHTS: Wt Readings from Last 3 Encounters:  09/22/18 229 lb (103.9 kg)  05/05/18 228 lb 6.4 oz (103.6 kg)  03/25/18 223 lb 3.2 oz (101.2 kg)    VITALS: BP 122/66   Pulse 61   Ht 5\' 11"  (1.803 m)   Wt 229 lb (103.9 kg)   BMI 31.94 kg/m   EXAM: General appearance: alert and no distress Neck: no carotid bruit, no JVD and thyroid not enlarged, symmetric, no tenderness/mass/nodules Lungs: clear to auscultation bilaterally Heart: regular rate and rhythm, S1, S2 normal, no murmur, click, rub or gallop Abdomen: soft, non-tender; bowel sounds normal; no masses,  no organomegaly and obese Extremities:  extremities normal, atraumatic, no cyanosis or edema Pulses: 2+ and symmetric Skin: Skin color, texture, turgor normal. No rashes or lesions Neurologic: Grossly normal Psych: Pleasant  EKG: Normal sinus rhythm 61-personally reviewed  ASSESSMENT: 1. Coronary artery disease status post PCI to the mid circumflex-Promus Premier 3.5 by 1mm DES (01/2017) 2. Alcohol abuse 3. Hypertension 4. Dyslipidemia 5. Obesity/Metabolic syndrome  PLAN:  1.   Charles Villa has persistently elevated triglycerides although LDL is close to goal at 76.  Triglycerides are over 300.  Based on ground breaking data from the reduce it trial, it suggest that he is at significant increased risk of coronary events.  In fact is residual risk of cardiovascular events at this point is still about 30% over 5 years.  We could further reduce that risk with Vascepa.  I recommend starting Vascepa based on guideline recommendations for elevated triglycerides for cardiovascular mortality benefit. Check repeat fasting lipids in 3-4 months and follow-up with me in 6 months.  Pixie Casino, MD, [facility] [place] Director of the Advanced Lipid Disorders &  Cardiovascular Risk Reduction Clinic Diplomate of the American Board of Clinical Lipidology Attending Cardiologist  Direct Dial: [phone]  Fax: [phone]  Website:  [email or web address]  09/22/2018, 10:36 AM

## 2018-09-22 NOTE — Patient Instructions (Signed)
Medication Instructions:  Stop [employer] Vascepa 2 grams twice a day If you need a refill on your cardiac medications before your next appointment, please call your pharmacy.   Lab work: Fasting Lipid and Direct LDL in 3 months If you have labs (blood work) drawn today and your tests are completely normal, you will receive your results only by: [name] MyChart Message (if you have MyChart) OR . A paper copy in the mail If you have any lab test that is abnormal or we need to change your treatment, we will call you to review the results.  Testing/Procedures: None ordered  Follow-Up: At [facility], you and your health needs are our priority.  As part of our continuing mission to provide you with exceptional heart care, we have created designated Provider Care Teams.  These Care Teams include your primary Cardiologist (physician) and Advanced Practice Providers (APPs -  Physician Assistants and Nurse Practitioners) who all work together to provide you with the care you need, when you need it. . Follow up with Dr.[name] in 6 months  Call 3 months before to schedule

## 2018-09-24 DIAGNOSIS — R69 Illness, unspecified: Secondary | ICD-10-CM | POA: Diagnosis not present

## 2018-09-26 ENCOUNTER — Encounter: Payer: Self-pay | Admitting: Internal Medicine

## 2018-10-19 ENCOUNTER — Encounter: Payer: Self-pay | Admitting: Gastroenterology

## 2018-10-22 ENCOUNTER — Telehealth: Payer: Self-pay | Admitting: Family Medicine

## 2018-10-22 NOTE — Telephone Encounter (Signed)
Ideally should avoid ibuprofen as can raise blood pressure- could try a topical like Aspercreme.  Tylenol would be safer blood pressure as well although may not be as effective as the ibuprofen

## 2018-10-22 NOTE — Telephone Encounter (Signed)
Please advise of interactions

## 2018-10-22 NOTE — Telephone Encounter (Signed)
FYI Pt notified of update and declined orders. Pt stated that he spoke to a pharmacist who advised pt that as long as he is not taking a lot of the ibuprofen that should be okay.

## 2018-10-22 NOTE — Telephone Encounter (Signed)
Copied from [place] [id number]. Topic: Quick Communication - See Telephone Encounter >> Oct 22, 2018 10:19 AM Rayann Heman wrote: CRM for notification. See Telephone encounter for: 10/22/18. Pt called and left a voice mail that states that he is having knee pain and would like to know if he can take ibuprofen with current medications. Please advise

## 2018-10-22 NOTE — Telephone Encounter (Signed)
See note

## 2018-10-26 ENCOUNTER — Encounter: Payer: Self-pay | Admitting: Family Medicine

## 2018-10-26 DIAGNOSIS — M25561 Pain in right knee: Secondary | ICD-10-CM

## 2018-10-27 NOTE — Telephone Encounter (Signed)
Referral placed with Dr. Paulla Fore.

## 2018-10-28 ENCOUNTER — Encounter: Payer: Self-pay | Admitting: Sports Medicine

## 2018-10-28 ENCOUNTER — Other Ambulatory Visit: Payer: Self-pay

## 2018-10-28 ENCOUNTER — Ambulatory Visit: Payer: Self-pay

## 2018-10-28 ENCOUNTER — Ambulatory Visit (INDEPENDENT_AMBULATORY_CARE_PROVIDER_SITE_OTHER): Payer: Medicare HMO | Admitting: Sports Medicine

## 2018-10-28 VITALS — BP 104/68 | HR 61 | Temp 98.2°F | Ht 71.0 in | Wt 227.8 lb

## 2018-10-28 DIAGNOSIS — M25461 Effusion, right knee: Secondary | ICD-10-CM

## 2018-10-28 DIAGNOSIS — M25561 Pain in right knee: Secondary | ICD-10-CM

## 2018-10-28 NOTE — Progress Notes (Signed)
Charles Villa. , [place] at [facility] [phone]  Charles Villa - 73 y.o. male MRN [id number]  Date of birth: 04/20/1946  Visit Date: 10/28/2018  PCP: Charles Olp, MD   Referred by: Charles Olp, MD  SUBJECTIVE:   Chief Complaint  Patient presents with  . New Patient (Initial Visit)    R knee pain x 2-3 weeks    HPI: Patient presents with 2 to 3 weeks of worsening right knee pain.  He denies any known mechanism of injury.  He is having worsening moderate pain with occasional clicking.  Pain is worse with going from sit to stand.  It is slightly improved over the past several weeks however continues to be swollen and has had only incomplete relief of symptoms since the onset.  Worse symptoms with transitioning from a deep squat to stand.  Does radiate down into the right thigh.  He is tried ibuprofen and Tylenol with only minimal improvements.  REVIEW OF SYSTEMS: Night time disturbances: Reports some issues with sleep onset as well as sleep maintenance. Fevers, chills and night sweats: Denies Unexplained weight loss: Denies Personal history of cancer: Denies Changes in bowel or bladder habits: Denies Recent unreported falls: Denies New or worsening dyspnea or wheezing: Denies Headaches and dizziness: Denies Numbness, tingling and weakness in the extremities: Denies Dizziness or presyncopal episodes: Denies Lower extremity edema: Denies  HISTORY:  Prior history reviewed and updated per electronic medical record.  Patient Active Problem List   Diagnosis Date Noted  . Thiamine deficiency 04/28/2017    On supplementation. Related to alcohol use.    . Pure hypercholesterolemia 03/17/2017  . Exertional dyspnea 02/24/2017  . CAD S/P percutaneous coronary angioplasty 02/24/2017  . Angina of effort ([place]) 02/24/2017  . S/P cardiac catheterization 02/24/2017  . Coronary artery disease involving native coronary artery of  native heart with angina pectoris ([place])   . Abnormal cardiovascular stress test 02/20/2017  . Other fatigue 02/20/2017  . Shortness of breath 02/03/2017  . Alcoholism in recovery ([place]) 02/03/2017  . Tremor 05/06/2016  . OCD (obsessive compulsive disorder) 01/23/2016    Ocd/hoarding issues- psychiatristis- [place] started on zoloft 100mg --> paxil   . Personal history of colonic polyps 03/14/2014    08/2005. Benign. 10 year repeat. 2010 colonoscopy in record was entered erroneously.    . Essential hypertension 03/11/2014    ziac 2.5-6.25   . Anal fissure 08/17/2012    Really more gluteal crease irritation- protosol hc 2.5% prn   . SQUAMOUS CELL CARCINOMA OF SKIN SITE UNSPECIFIED 08/24/2010    Annotation: face   . GERD 02/23/2007  . Hyperlipidemia with target low density lipoprotein (LDL) cholesterol less than 70 mg/dL 02/10/2007    Rosuvastatin 40mg  In past- 20 mg zocor daily, poor control   . PSORIASIS 02/10/2007    Topical therapy.     [name] BACK PAIN, CHRONIC 02/10/2007   Social History   Occupational History  . Occupation: Education officer, community: UNEMPLOYED  Tobacco Use  . Smoking status: Never Smoker  . Smokeless tobacco: Never Used  Substance and Sexual Activity  . Alcohol use: Yes    Alcohol/week: 21.0 standard drinks    Types: 21 Standard drinks or equivalent per week    Comment: 2-3 per day (gin)  . Drug use: No  . Sexual activity: Not on file   Social History   Social History Narrative   Married 42 years in  2015. 2 kids, 1 grandchild that is 84.61 years old.       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       Hobbies: ride bike, ski in winter    Past Medical History:  Diagnosis Date  . Anal fissure 08/17/2012   Really more gluteal crease irritation- protosol hc 2.5% prn   . BACK PAIN, CHRONIC 02/10/2007  . CAD (coronary artery disease), native coronary artery    02/24/17-PCI/DES to mLcx, normal EF.  .  Diverticulosis of colon    2007  . GERD 02/23/2007  . GERD (gastroesophageal reflux disease)   . Hyperlipidemia   . Hypertension   . OCD (obsessive compulsive disorder) 01/23/2016   Ocd/hoarding issues- psychiatristis- [place]le started on zoloft 100mg --> paxil  . Personal history of colonic polyps 03/14/2014   08/2005. Benign. 10 year repeat. 2010 colonoscopy in record was entered erroneously.    [name] PSORIASIS 02/10/2007   Topical therapy.     . Scalp psoriasis   . SQUAMOUS CELL CARCINOMA OF SKIN SITE UNSPECIFIED 08/24/2010   Annotation: face    Past Surgical History:  Procedure Laterality Date  . COLONOSCOPY W/ POLYPECTOMY     2007 [name]  . CORONARY STENT INTERVENTION N/A 02/24/2017   Procedure: Coronary Stent Intervention;  Surgeon: Charles Man, MD;  Location: [place] [facility];  Service: Cardiovascular;  Laterality: N/A;  . LEFT HEART CATH AND CORONARY ANGIOGRAPHY N/A 02/24/2017   Procedure: Left Heart Cath and Coronary Angiography;  Surgeon: Charles Man, MD;  Location: [place] [facility];  Service: Cardiovascular;  Laterality: N/A;  . SKIN CANCER EXCISION     squamous cell CA - nose  . TONSILLECTOMY     family history includes Hypertension in his mother; Lung cancer in his father; Stroke in his mother.  OBJECTIVE:  VS:  HT:5\' 11"  (180.3 cm)   WT:227 lb 12.8 oz (103.3 kg)  BMI:31.79    BP:104/68  HR:61bpm  TEMP:98.2 F (36.8 C)( )  RESP:93 %   PHYSICAL EXAM: CONSTITUTIONAL: Well-developed, Well-nourished and In no acute distress EYES: Pupils are equal., EOM intact without nystagmus. and No scleral icterus. Psychiatric: Alert & appropriately interactive. and Not depressed or anxious appearing. EXTREMITY EXAM: Warm and well perfused  Right knee is overall well aligned.  He does have a moderate effusion.  Generalized tendinitis is present.  Medial joint line pain.  Straight leg raise is negative and his strength is 5 out of 5 with knee extensors testing.  He has  ligamentously stable knee with varus and valgus stressing as well as anterior and posterior drawer.   ASSESSMENT:   1. Acute pain of right knee   2. Effusion of right knee     PROCEDURES:  PROCEDURE NOTE: THERAPEUTIC EXERCISES (69678)  Discussed the foundation of treatment for this condition is physical therapy and/or daily (5-6 days/week) therapeutic exercises, focusing on core strengthening, coordination, neuromuscular control/reeducation. 15 minutes spent for Therapeutic exercises as below and as referenced in the AVS. This included exercises focusing on stretching, strengthening, with significant focus on eccentric aspects.  Proper technique shown and discussed handout in great detail with ATC. All questions were discussed and answered.  Long term goals include an improvement in range of motion, strength, endurance as well as avoiding reinjury. Frequency of visits is one time as determined during today's office visit. Frequency of exercises to be performed is as per handout. EXERCISES REVIEWED: Hip ABduction strengthening with focus on Glute  Medius Recruitment VMO Strengthening    US Guided Injection per procedure note      PLAN:  Pertinent additional documentation may be included in corresponding procedure notes, imaging studies, problem based documentation and patient instructions.  No problem-specific Assessment & Plan notes found for this encounter.   We will go ahead and inject the knee per procedure note and have them begin on hip and knee strenghtening exersises. Additionally we discussed the merits of compression and/or bracing and recommend prophylatic compression with activity.  Icing discussed PRN.  If persistent ongoing symptoms can consider repeat injections and viscous supplementation.  Home Therapeutic Exercises: New program prescribed today per procedure note.   Activity modifications and the importance of avoiding exacerbating activities (limiting pain to no  more than a 4 / 10 during or following activity) recommended and discussed.   Discussed red flag symptoms that warrant earlier emergent evaluation and patient voices understanding.    No orders of the defined types were placed in this encounter.  Lab Orders  No laboratory test(s) ordered today   Imaging Orders     [place] [facility] POCT ULTRASOUND Referral Orders  No referral(s) requested today     Return if symptoms worsen or fail to improve.          Gerda Diss, [place] [facility] Physician

## 2018-10-28 NOTE — Patient Instructions (Addendum)

## 2018-10-28 NOTE — Procedures (Signed)
PROCEDURE NOTE:  Ultrasound Guided: Aspiration and Injection: Right knee Images were obtained and interpreted by myself, Teresa Coombs, DO  Images have been saved and stored to PACS system. Images obtained on: GE S7 Ultrasound machine    ULTRASOUND FINDINGS:  Moderate effusion Moderate synovitis Minimal spurring Large swelling with hypervascularity of the medial meniscus  DESCRIPTION OF PROCEDURE:  The patient's clinical condition is marked by substantial pain and/or significant functional disability. Other conservative therapy has not provided relief, is contraindicated, or not appropriate. There is a reasonable likelihood that injection will significantly improve the patient's pain and/or functional impairment.   After discussing the risks, benefits and expected outcomes of the injection and all questions were reviewed and answered, the patient wished to undergo the above named procedure.  Verbal consent was obtained.  The ultrasound was used to identify the target structure and adjacent neurovascular structures. The skin was then prepped in sterile fashion and the target structure was injected under direct visualization using sterile technique as below:  Single injection performed as below: PREP: Alcohol, Ethel Chloride and 3 cc 1% lidocaine on 25g 1.5 in. needle APPROACH:superiolateral, stopcock technique, 21g 2 in. INJECTATE: 2 cc 0.5% Marcaine and 2 cc 40mg /mL DepoMedrol ASPIRATE: 5mL  and straw colored  DRESSING: Band-Aid  Post procedural instructions including recommending icing and warning signs for infection were reviewed.    This procedure was well tolerated and there were no complications.   IMPRESSION: Succesful Ultrasound Guided: Aspiration and Injection

## 2018-10-29 ENCOUNTER — Encounter: Payer: Self-pay | Admitting: Sports Medicine

## 2019-01-08 ENCOUNTER — Other Ambulatory Visit: Payer: Self-pay | Admitting: Family Medicine

## 2019-01-08 NOTE — Telephone Encounter (Signed)
Last OV 05/05/18 Last refill 07/20/18 #90/1 Next OV 02/08/19

## 2019-02-03 ENCOUNTER — Telehealth: Payer: Self-pay | Admitting: Family Medicine

## 2019-02-03 DIAGNOSIS — E785 Hyperlipidemia, unspecified: Secondary | ICD-10-CM

## 2019-02-03 DIAGNOSIS — I10 Essential (primary) hypertension: Secondary | ICD-10-CM

## 2019-02-03 NOTE — Telephone Encounter (Addendum)
I ordered age-appropriate labs- we can discuss any additional labs requested by patient at visit.  I included cholesterol.  Has not been a full year since last lipids-with medication change hopefully insurance will cover  Please help him set up a lab visit

## 2019-02-03 NOTE — Telephone Encounter (Signed)
Patient has a virtual visit on Tuesday 7/14 at 10am for hs AWV with Dr. Yong Channel. He wants to have labs ordered and a lab appointment scheduled prior to 7/14 to check his triglycerides due to Dr. Debara Pickett (his cardiologist) putting him on [place]. Please contact patient to advise on orders and scheduling the lab appointment before 7/14.

## 2019-02-03 NOTE — Addendum Note (Signed)
Addended by: Marin Olp on: 02/03/2019 01:10 PM   Modules accepted: Orders

## 2019-02-03 NOTE — Telephone Encounter (Signed)
Forwarding to Dr. Yong Channel to advise regarding lab orders.

## 2019-02-04 NOTE — Telephone Encounter (Signed)
Called pt and scheduled lab visit for tomorrow.

## 2019-02-05 ENCOUNTER — Other Ambulatory Visit: Payer: Self-pay

## 2019-02-05 ENCOUNTER — Other Ambulatory Visit (INDEPENDENT_AMBULATORY_CARE_PROVIDER_SITE_OTHER): Payer: Medicare HMO

## 2019-02-05 ENCOUNTER — Other Ambulatory Visit: Payer: Medicare HMO

## 2019-02-05 DIAGNOSIS — I10 Essential (primary) hypertension: Secondary | ICD-10-CM

## 2019-02-05 DIAGNOSIS — E785 Hyperlipidemia, unspecified: Secondary | ICD-10-CM | POA: Diagnosis not present

## 2019-02-05 LAB — CBC
HCT: 44.5 % (ref 39.0–52.0)
Hemoglobin: 14.8 g/dL (ref 13.0–17.0)
MCHC: 33.3 g/dL (ref 30.0–36.0)
MCV: 103.4 fl — ABNORMAL HIGH (ref 78.0–100.0)
Platelets: 178 10*3/uL (ref 150.0–400.0)
RBC: 4.3 Mil/uL (ref 4.22–5.81)
RDW: 13.3 % (ref 11.5–15.5)
WBC: 6.1 10*3/uL (ref 4.0–10.5)

## 2019-02-05 LAB — COMPREHENSIVE METABOLIC PANEL
ALT: 30 U/L (ref 0–53)
AST: 32 U/L (ref 0–37)
Albumin: 3.9 g/dL (ref 3.5–5.2)
Alkaline Phosphatase: 43 U/L (ref 39–117)
BUN: 16 mg/dL (ref 6–23)
CO2: 26 mEq/L (ref 19–32)
Calcium: 8.7 mg/dL (ref 8.4–10.5)
Chloride: 104 mEq/L (ref 96–112)
Creatinine, Ser: 1.18 mg/dL (ref 0.40–1.50)
GFR: 60.81 mL/min (ref >60.00)
Glucose, Bld: 107 mg/dL — ABNORMAL HIGH (ref 70–99)
Potassium: 4.1 mEq/L (ref 3.5–5.1)
Sodium: 140 mEq/L (ref 135–145)
Total Bilirubin: 0.5 mg/dL (ref 0.2–1.2)
Total Protein: 6.6 g/dL (ref 6.0–8.3)

## 2019-02-05 LAB — LIPID PANEL
Cholesterol: 159 mg/dL (ref 0–200)
HDL: 62.6 mg/dL (ref >39.00)
LDL Cholesterol: 70 mg/dL (ref 0–99)
NonHDL: 96.29
Total CHOL/HDL Ratio: 3
Triglycerides: 130 mg/dL (ref 0.0–149.0)
VLDL: 26 mg/dL (ref 0.0–40.0)

## 2019-02-08 ENCOUNTER — Ambulatory Visit: Payer: Medicare HMO

## 2019-02-08 NOTE — Progress Notes (Signed)
Phone [phone]   Subjective:  Virtual visit via Video note. Chief complaint: Patient presents today for their annual wellness visit.   Chief Complaint  Patient presents with  . Medicare Wellness   This visit type was conducted due to national recommendations for restrictions regarding the COVID-19 Pandemic (e.g. social distancing).  This format is felt to be most appropriate for this patient at this time balancing risks to patient and risks to population by having him in for in person visit.  No physical exam was performed (except for noted visual exam or audio findings with Telehealth visits).    Our team/I connected with [name] at 10:00 AM EDT by a video enabled telemedicine application (doxy.me or caregility through epic) and verified that I am speaking with the correct person using two identifiers.  Location patient: Home[facility] Location provider: [facility], office Persons participating in the virtual visit:  patient  Our team/I discussed the limitations of evaluation and management by telemedicine and the availability of in person appointments. In light of current covid-19 pandemic, patient also understands that we are trying to protect them by minimizing in office contact if at all possible.  The patient expressed consent for telemedicine visit and agreed to proceed. Patient understands insurance will be billed.   Preventive Screening-Counseling & Management  Smoking Status: Never Smoker Second Hand Smoking status: No smokers in home Alcohol intake: 20 per week, about 3 per day  Risk Factors Regular exercise: nothing structured, does some yard work. Encouraged regular exercise. He wants to get back into riding his bike- trying to get this fixed Diet: Occasional comfort food. Eats 3 meals daily, occasional snack. Fruits/Veggies a few times a week- advised him to increase this. Mostly lean protein. Occasionally red meat.  Weight stable on home scales around 220 Wt Readings  from Last 3 Encounters:  02/09/19 219 lb (99.3 kg)  10/28/18 227 lb 12.8 oz (103.3 kg)  09/22/18 229 lb (103.9 kg)  Fall Risk: None  Fall Risk  02/09/2019 02/04/2018 03/18/2017 02/03/2017 12/16/2016  Falls in the past year? 0 Yes Yes Yes Yes  Number falls in past yr: 0 1 1 2  or more 1  Injury with Fall? 0 Yes Yes Yes No  Comment - - - Broken front tooth -  Risk Factor Category  - High Fall Risk High Fall Risk High Fall Risk -  Risk for fall due to : - - History of fall(s) History of fall(s) -  Follow up - Falls prevention discussed;Education provided Falls evaluation completed Education provided;Falls prevention discussed Falls evaluation completed  Opioid use history:  no long term opioids use  Cardiac risk factors:  advanced age (older than 53 for men, 76 for women)  Known CAD Controlled Hyperlipidemia  controlled Hypertension  No diabetes. Family History: no CAD noted, mother had stroke  Depression Screen None. PHQ2 0  Depression screen [facility] 2/9 02/09/2019 02/04/2018 06/16/2017 03/26/2017 03/26/2017  Decreased Interest 0 0 0 0 0  Down, Depressed, Hopeless 0 0 0 0 0  PHQ - 2 Score 0 0 0 0 0  Altered sleeping - 0 - - -  Tired, decreased energy - 0 - - -  Change in appetite - 0 - - -  Feeling bad or failure about yourself  - 0 - - -  Trouble concentrating - 0 - - -  Moving slowly or fidgety/restless - 0 - - -  Suicidal thoughts - 0 - - -  PHQ-9 Score - 0 - - -  Difficult doing work/chores - Not difficult at all - - -   Activities of Daily Living Independent ADLs and IADLs - Yes  Hearing Difficulties: pt denies  Cognitive Testing             No reported trouble.   Mini cog with normal clock draw and Normal 3 word recall  leader, season, table    List the Names of Other Physician/Practitioners you currently use: - Dr. Debara Pickett cardiology  - Dr. Lavone Neri dentistry - Dr. Carles Collet in 2018 for tremor- was difficult to evaluate due to alcohol use at time which could treat essential tremor -  Dr. Loletha Carrow GI  Immunization History  Administered Date(s) Administered  . DTP 08/10/2007  . Influenza Split 06/08/2012  . Influenza Whole 08/21/2009  . Influenza, High Dose Seasonal PF 05/12/2013, 05/06/2016, 05/05/2018  . Influenza, Seasonal, Injecte, Preservative Fre 05/23/2013  . Influenza-Unspecified 05/01/2015  . Pneumococcal Conjugate-13 08/30/2013  . Pneumococcal Polysaccharide-23 03/11/2014  . Td 07/29/1998, 08/15/2008  . Tdap 10/07/2016   Required Immunizations needed today - up to date other than shingrx- discussed potential SE and overlap with covid 19- he still may proceed Health Maintenance  Topic Date Due  . Colon Cancer Screening  10/23/2018  . Flu Shot  02/27/2019  . Tetanus Vaccine  10/08/2026  .  Hepatitis C: One time screening is recommended by Center for Disease Control  (CDC) for  adults born from [birth year] through 1963.   Completed  . Pneumonia vaccines  Completed   Screening tests- up to date 1. Colon cancer screening- Pt will call GI to schedule colonoscopy. He has some concerns with covid 19 2. Prostate cancer screening- urinary symptoms stable- we will d/c screening given over age 59  Lab Results  Component Value Date   PSA 0.44 05/05/2018   PSA 0.40 08/04/2015   PSA 0.44 03/07/2014  3. Lung cancer screening- not a candidate due to never smoker 4. Skin cancer screening- dermatology as needed for psoriasis mainly- also advised full skin exam  ROS- No pertinent positives discovered in course of AWV ROS pertinent- No chest pain or shortness of breath. No headache or blurry vision. Skin changes from psoriasis   The following were reviewed and entered/updated in epic: Past Medical History:  Diagnosis Date  . Anal fissure 08/17/2012   Really more gluteal crease irritation- protosol hc 2.5% prn   . BACK PAIN, CHRONIC 02/10/2007  . CAD (coronary artery disease), native coronary artery    02/24/17-PCI/DES to mLcx, normal EF.  . Diverticulosis of colon    2007   . GERD 02/23/2007  . GERD (gastroesophageal reflux disease)   . Hyperlipidemia   . Hypertension   . OCD (obsessive compulsive disorder) 01/23/2016   Ocd/hoarding issues- psychiatristis- Cottle started on zoloft 100mg --> paxil  . Personal history of colonic polyps 03/14/2014   08/2005. Benign. 10 year repeat. 2010 colonoscopy in record was entered erroneously.    [name] PSORIASIS 02/10/2007   Topical therapy.     . Scalp psoriasis   . SQUAMOUS CELL CARCINOMA OF SKIN SITE UNSPECIFIED 08/24/2010   Annotation: face    Patient Active Problem List   Diagnosis Date Noted  . CAD S/P percutaneous coronary angioplasty 02/24/2017    Priority: High  . Alcoholism in recovery ([place]) 02/03/2017    Priority: High  . Alcoholic peripheral neuropathy ([place]) 02/09/2019    Priority: Medium  . Thiamine deficiency 04/28/2017    Priority: Medium  . Tremor 05/06/2016    Priority: Medium  .  OCD (obsessive compulsive disorder) 01/23/2016    Priority: Medium  . Essential hypertension 03/11/2014    Priority: Medium  . Hyperlipidemia with target low density lipoprotein (LDL) cholesterol less than 70 mg/dL 02/10/2007    Priority: Medium  . PSORIASIS 02/10/2007    Priority: Medium  . S/P cardiac catheterization 02/24/2017    Priority: Low  . Personal history of colonic polyps 03/14/2014    Priority: Low  . Anal fissure 08/17/2012    Priority: Low  . SQUAMOUS CELL CARCINOMA OF SKIN SITE UNSPECIFIED 08/24/2010    Priority: Low  . GERD 02/23/2007    Priority: Low  . BACK PAIN, CHRONIC 02/10/2007    Priority: Low  . Abnormal cardiovascular stress test 02/20/2017   Past Surgical History:  Procedure Laterality Date  . COLONOSCOPY W/ POLYPECTOMY     2007 [name]  . CORONARY STENT INTERVENTION N/A 02/24/2017   Procedure: Coronary Stent Intervention;  Surgeon: Leonie Man, MD;  Location: [facility];  Service: Cardiovascular;  Laterality: N/A;  . LEFT HEART CATH AND CORONARY ANGIOGRAPHY N/A 02/24/2017    Procedure: Left Heart Cath and Coronary Angiography;  Surgeon: Leonie Man, MD;  Location: [facility] [facility];  Service: Cardiovascular;  Laterality: N/A;  . SKIN CANCER EXCISION     squamous cell CA - nose  . TONSILLECTOMY      Family History  Problem Relation Age of Onset  . Lung cancer Father   . Stroke Mother   . Hypertension Mother     Medications- reviewed and updated Current Outpatient Medications  Medication Sig Dispense Refill  . aspirin EC 81 MG tablet Take 81 mg by mouth daily.    [name] b complex vitamins capsule Take 1 capsule by mouth daily.    . bisoprolol-hydrochlorothiazide (ZIAC) 2.5-6.25 MG tablet TAKE 1 TABLET BY MOUTH EVERY DAY 90 tablet 3  . clobetasol (TEMOVATE) 0.05 % external solution Apply 1 application topically at bedtime as needed (scalp).   5  . clobetasol cream (TEMOVATE) 8.33 % Apply 1 application topically 2 (two) times daily as needed (abdomen).   3  . clotrimazole-betamethasone (LOTRISONE) cream Apply 1 application topically 2 (two) times daily as needed. psoriass    . desonide (DESONATE) 0.05 % gel Apply 1 application topically 2 (two) times daily as needed. Skin lesions    . fluocinolone (VANOS) 0.01 % cream Apply 1 application topically daily as needed.     . hydrocortisone (ANUSOL-HC) 2.5 % rectal cream Place 1 application rectally 2 (two) times daily as needed for hemorrhoids or anal itching.    . hydrocortisone 2.5 % cream Apply 1 application topically 2 (two) times daily as needed (face).   5  . Icosapent Ethyl (VASCEPA) 1 g CAPS Take 2 capsules ( 2 grams ) twice a day (Patient taking differently: 1 capsule qAM.) 120 capsule 6  . nitroGLYCERIN (NITROSTAT) 0.4 MG SL tablet Place 1 tablet (0.4 mg total) under the tongue every 5 (five) minutes as needed. 25 tablet 3  . pantoprazole (PROTONIX) 40 MG tablet TAKE 1 TABLET BY MOUTH EVERY DAY (Patient taking differently: Take 40 mg by mouth 3 (three) times a week. Mondays, Wednesdays, and Fridays) 90  tablet 3  . Probiotic Product (PROBIOTIC DAILY PO) Take 1 tablet by mouth 3 (three) times a week. Tuesdays, Thursdays, and Saturdays    . rosuvastatin (CRESTOR) 40 MG tablet TAKE 1 TABLET BY MOUTH EVERY DAY 90 tablet 0  . silver sulfADIAZINE (SILVADENE) 1 % cream  Apply 1 application topically 2 (two) times daily as needed (groin).   5  . naltrexone (DEPADE) 50 MG tablet Take 1 tablet (50 mg total) by mouth daily. (Patient not taking: Reported on 02/09/2019) 90 tablet 3   No current facility-administered medications for this visit.     Allergies-reviewed and updated No Known Allergies  Social History   Socioeconomic History  . Marital status: Married    Spouse name: Not on file  . Number of children: 2  . Years of education: Not on file  . Highest education level: Not on file  Occupational History  . Occupation: Education officer, community: UNEMPLOYED  Social Needs  . Financial resource strain: Not on file  . Food insecurity    Worry: Not on file    Inability: Not on file  . Transportation needs    Medical: Not on file    Non-medical: Not on file  Tobacco Use  . Smoking status: Never Smoker  . Smokeless tobacco: Never Used  Substance and Sexual Activity  . Alcohol use: Yes    Alcohol/week: 21.0 standard drinks    Types: 21 Standard drinks or equivalent per week    Comment: 2-3 per day (gin)  . Drug use: No  . Sexual activity: Not on file  Lifestyle  . Physical activity    Days per week: Not on file    Minutes per session: Not on file  . Stress: Not on file  Relationships  . Social Herbalist on phone: Not on file    Gets together: Not on file    Attends religious service: Not on file    Active member of club or organization: Not on file    Attends meetings of clubs or organizations: Not on file    Relationship status: Not on file  Other Topics Concern  . Not on file  Social History Narrative   Married 42 years in 2015. 2 kids, 1 grandchild  that is 65.57 years old.       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       Hobbies: ride bike, ski in winter       Objective:  BP 115/80   Pulse (!) 57   Temp (!) 95.9 F (35.5 C) (Oral)   Ht 5\' 11"  (1.803 m)   Wt 219 lb (99.3 kg)   BMI 30.54 kg/m  Gen: NAD, resting comfortably HEENT: Mucous membranes are moist. Oropharynx normal Neck: no thyromegaly CV: RRR no murmurs rubs or gallops Lungs: CTAB no crackles, wheeze, rhonchi Abdomen: soft/nontender/nondistended/normal bowel sounds. No rebound or guarding.  Ext: no edema Skin: warm, dry Neuro: grossly normal, moves all extremities, PERRLA   Assessment/Plan:  AWV completed- discussed recommended screenings and documented any personalized health advice and referrals for preventive counseling. See AVS as well which was given to patient.   Status of chronic or acute concerns  HTN - Taking Bisoprolol-HCT 2.5-6.25 mg daily. No missed doses, no side effects.  Limiting adding salt to food.   Hyperlipidemia/CAD - Taking Aspirin 81 mg and Rosuvastatin 40 mg daily. Lipids looked good on 02/05/2019.  No missed doses, no side effects.   GERD- Taking Protonix 40 mg daily. Well controlled.   Some right knee pain- has been taking ibuprofen- advised try voltaren gel  Alcoholism- much improved down to 2 drinks per day- still encouraged full cessation. Takes b complex with thiamine. Alcoholic neuropathy stable- no treatment  Macrocytosis- will get b12 and folate in October with labs  Recommended follow up: already scheduled Future Appointments  Date Time Provider [place]  05/06/2019  9:20 AM Marin Olp, MD [facility]    Lab/Order associations:   ICD-10-CM   1. Preventative health care  Z00.00   2. Alcoholic peripheral neuropathy (HCC) Chronic G62.1   3. Alcoholism in recovery [facility]) Chronic F10.21   4. Obesity (BMI 30-39.9)  E66.9   5. Hyperlipidemia with target low density lipoprotein  (LDL) cholesterol less than 70 mg/dL  E78.5   6. Essential hypertension  I10   7. Thiamine deficiency  E51.9    Return precautions advised. Garret Reddish, MD

## 2019-02-08 NOTE — Patient Instructions (Addendum)
Charles Villa , Thank you for taking time to come for your Medicare Wellness Visit. I appreciate your ongoing commitment to your health goals. Please review the following plan we discussed and let me know if I can assist you in the future.   These are the goals we discussed: 1. Exercise at least 10 minutes 3x a week to start with long term goal 30 minutes 5 times a week. Start slow and build up.  2. Call GI to schedule follow up colonoscopy 3. Get on wait list for shingrix at your pharmacy- shingles vaccine 4. Try voltaren gel instead of ibuprofen- this is basically topical ibuprofen that you can use on your knees up to 4x a day. Ibuprofen orally can be tough on kidneys and the heart.   This is a list of the screening recommended for you and due dates:  Health Maintenance  Topic Date Due  . Colon Cancer Screening  10/23/2018  . Flu Shot  02/27/2019  . Tetanus Vaccine  10/08/2026  .  Hepatitis C: One time screening is recommended by Center for Disease Control  (CDC) for  adults born from [birth year] through 1963.   Completed  . Pneumonia vaccines  Completed

## 2019-02-09 ENCOUNTER — Encounter: Payer: Self-pay | Admitting: Family Medicine

## 2019-02-09 ENCOUNTER — Ambulatory Visit (INDEPENDENT_AMBULATORY_CARE_PROVIDER_SITE_OTHER): Payer: Medicare HMO | Admitting: Family Medicine

## 2019-02-09 VITALS — BP 115/80 | HR 57 | Temp 95.9°F | Ht 71.0 in | Wt 219.0 lb

## 2019-02-09 DIAGNOSIS — I10 Essential (primary) hypertension: Secondary | ICD-10-CM | POA: Diagnosis not present

## 2019-02-09 DIAGNOSIS — E785 Hyperlipidemia, unspecified: Secondary | ICD-10-CM

## 2019-02-09 DIAGNOSIS — F1021 Alcohol dependence, in remission: Secondary | ICD-10-CM

## 2019-02-09 DIAGNOSIS — E519 Thiamine deficiency, unspecified: Secondary | ICD-10-CM

## 2019-02-09 DIAGNOSIS — Z0000 Encounter for general adult medical examination without abnormal findings: Secondary | ICD-10-CM | POA: Diagnosis not present

## 2019-02-09 DIAGNOSIS — R69 Illness, unspecified: Secondary | ICD-10-CM | POA: Diagnosis not present

## 2019-02-09 DIAGNOSIS — E669 Obesity, unspecified: Secondary | ICD-10-CM

## 2019-02-09 DIAGNOSIS — G621 Alcoholic polyneuropathy: Secondary | ICD-10-CM | POA: Diagnosis not present

## 2019-02-15 ENCOUNTER — Encounter: Payer: Self-pay | Admitting: Family Medicine

## 2019-02-17 ENCOUNTER — Other Ambulatory Visit: Payer: Self-pay | Admitting: Physical Therapy

## 2019-02-17 DIAGNOSIS — M25561 Pain in right knee: Secondary | ICD-10-CM

## 2019-02-24 ENCOUNTER — Ambulatory Visit (INDEPENDENT_AMBULATORY_CARE_PROVIDER_SITE_OTHER): Payer: Medicare HMO | Admitting: Orthopedic Surgery

## 2019-02-24 ENCOUNTER — Other Ambulatory Visit: Payer: Self-pay

## 2019-02-24 ENCOUNTER — Ambulatory Visit (INDEPENDENT_AMBULATORY_CARE_PROVIDER_SITE_OTHER): Payer: Medicare HMO

## 2019-02-24 ENCOUNTER — Encounter: Payer: Self-pay | Admitting: Orthopedic Surgery

## 2019-02-24 DIAGNOSIS — M25561 Pain in right knee: Secondary | ICD-10-CM

## 2019-02-24 NOTE — Progress Notes (Signed)
Office Visit Note   Patient: Charles Villa           Date of Birth: Dec 12, 1945           MRN: [id number] Visit Date: 02/24/2019 Requested by: Marin Olp, MD [place],  [place] [address] PCP: Marin Olp, MD  Subjective: Chief Complaint  Patient presents with   Right Knee - Pain    HPI: Charles Villa is a 73 y.o. male who presents to the office complaining of R knee pain.  Patient states pain is been going on for 6 months.  Pain is worse with ambulation but does not wake him up at night or interfere with his current lifestyle.  He localizes the pain to the lateral joint line.  He takes 1 pill of 200 mg ibuprofen that helps ease his pain significantly and he brought Voltaren gel to use but he has not tried it yet.  He denies any mechanical symptoms, groin pain, radicular low back symptoms.  He was previously treated by Dr. Paulla Fore, who tried aspirating his knee with an injection of cortisone that provided relief for about 1 month back in May 2020.  Patient has a history of psoriasis for which he uses topical treatments.              ROS: All systems reviewed are negative as they relate to the chief complaint within the history of present illness.  Patient denies  fevers or chills.  Assessment & Plan: Visit Diagnoses:  1. Right knee pain, unspecified chronicity     Plan: Patient is a 73 year old male with a six-month history of right knee pain.  He has mild degenerative changes that are seen on x-ray of the right knee today in clinic.  His history and physical reveals that his pain does not seem to cause him great discomfort and he is doing well on his current regimen of anti-inflammatories.  Discussed options with patient and concluded that he will stay on his regimen of anti-inflammatories and if his pain becomes significantly worse he will come into the office to try an injection of cortisone.  Patient agrees with plan and will follow-up the office as  needed.  Follow-Up Instructions: No follow-ups on file.   Orders:  Orders Placed This Encounter  Procedures   XR KNEE 3 VIEW RIGHT   No orders of the defined types were placed in this encounter.     Procedures: No procedures performed   Clinical Data: No additional findings.  Objective: Vital Signs: There were no vitals taken for this visit.  Physical Exam:   Constitutional: Patient appears well-developed HEENT:  Head: Normocephalic Eyes:EOM are normal Neck: Normal range of motion Cardiovascular: Normal rate Pulmonary/chest: Effort normal Neurologic: Patient is alert Skin: Skin is warm Psychiatric: Patient has normal mood and affect    Ortho Exam:  No effusion Extensor mechanism intact No TTP over the medial jointline, quad tendon, patellar tendon, pes anserinus, patella, tibial tubercle, LCL/MCL insertions Mild TTP over the lateral jointline Stable to varus/valgus stresses.  Stable to anterior/posterior drawer Extension to 0 degrees Flexion > 90 degrees   Specialty Comments:  No specialty comments available.  Imaging: No results found.   PMFS History: Patient Active Problem List   Diagnosis Date Noted   Alcoholic peripheral neuropathy ([place]) 02/09/2019   Thiamine deficiency 04/28/2017   CAD S/P percutaneous coronary angioplasty 02/24/2017   S/P cardiac catheterization 02/24/2017   Abnormal cardiovascular stress test 02/20/2017  Alcoholism in recovery ([place]) 02/03/2017   Tremor 05/06/2016   OCD (obsessive compulsive disorder) 01/23/2016   Personal history of colonic polyps 03/14/2014   Essential hypertension 03/11/2014   Anal fissure 08/17/2012   SQUAMOUS CELL CARCINOMA OF SKIN SITE UNSPECIFIED 08/24/2010   GERD 02/23/2007   Hyperlipidemia with target low density lipoprotein (LDL) cholesterol less than 70 mg/dL 02/10/2007   PSORIASIS 02/10/2007   BACK PAIN, CHRONIC 02/10/2007   Past Medical History:  Diagnosis Date   Anal  fissure 08/17/2012   Really more gluteal crease irritation- protosol hc 2.5% prn    BACK PAIN, CHRONIC 02/10/2007   CAD (coronary artery disease), native coronary artery    02/24/17-PCI/DES to mLcx, normal EF.   Diverticulosis of colon    2007   GERD 02/23/2007   GERD (gastroesophageal reflux disease)    Hyperlipidemia    Hypertension    OCD (obsessive compulsive disorder) 01/23/2016   Ocd/hoarding issues- psychiatristis- Cottle started on zoloft 100mg --> paxil   Personal history of colonic polyps 03/14/2014   08/2005. Benign. 10 year repeat. 2010 colonoscopy in record was entered erroneously.     PSORIASIS 02/10/2007   Topical therapy.      Scalp psoriasis    SQUAMOUS CELL CARCINOMA OF SKIN SITE UNSPECIFIED 08/24/2010   Annotation: face     Family History  Problem Relation Age of Onset   Lung cancer Father    Stroke Mother    Hypertension Mother     Past Surgical History:  Procedure Laterality Date   COLONOSCOPY W/ POLYPECTOMY     2007 [name]   CORONARY STENT INTERVENTION N/A 02/24/2017   Procedure: Coronary Stent Intervention;  Surgeon: Leonie Man, MD;  Location: [place] [facility];  Service: Cardiovascular;  Laterality: N/A;   LEFT HEART CATH AND CORONARY ANGIOGRAPHY N/A 02/24/2017   Procedure: Left Heart Cath and Coronary Angiography;  Surgeon: Leonie Man, MD;  Location: [place] [facility];  Service: Cardiovascular;  Laterality: N/A;   SKIN CANCER EXCISION     squamous cell CA - nose   TONSILLECTOMY     Social History   Occupational History   Occupation: Education officer, community: UNEMPLOYED  Tobacco Use   Smoking status: Never Smoker   Smokeless tobacco: Never Used  Substance and Sexual Activity   Alcohol use: Yes    Alcohol/week: 21.0 standard drinks    Types: 21 Standard drinks or equivalent per week    Comment: 2-3 per day (gin)   Drug use: No   Sexual activity: Not on file

## 2019-03-01 ENCOUNTER — Encounter: Payer: Self-pay | Admitting: Orthopedic Surgery

## 2019-03-02 NOTE — Telephone Encounter (Signed)
I think the most likely explanation for the anterior medial right knee pain would be non-radiographic arthritis or degenerative meniscal tear.  If this is bothering him enough that he would consider an intervention then his options are injection into the knee joint versus MRI scan which will likely show some pathology but whether or not it merits any type of intervention really depends on his symptoms.

## 2019-04-04 ENCOUNTER — Other Ambulatory Visit: Payer: Self-pay | Admitting: Family Medicine

## 2019-04-09 ENCOUNTER — Encounter: Payer: Self-pay | Admitting: Family Medicine

## 2019-04-09 DIAGNOSIS — Z1211 Encounter for screening for malignant neoplasm of colon: Secondary | ICD-10-CM

## 2019-04-12 ENCOUNTER — Encounter: Payer: Self-pay | Admitting: Gastroenterology

## 2019-05-05 ENCOUNTER — Telehealth: Payer: Self-pay | Admitting: Gastroenterology

## 2019-05-05 NOTE — Progress Notes (Signed)
Phone: [phone]   Subjective:  Patient presents today for their annual physical. Chief complaint-noted.   See problem oriented charting- ROS- full  review of systems was completed and negative  except for: congestion, post nasal drip,runny nose- reports these are from allergies, abdominal pain slight at times, joint pain (right knee) and bruising/bleeds easy.  The following were reviewed and entered/updated in epic: Past Medical History:  Diagnosis Date  . Anal fissure 08/17/2012   Really more gluteal crease irritation- protosol hc 2.5% prn   . BACK PAIN, CHRONIC 02/10/2007  . CAD (coronary artery disease), native coronary artery    02/24/17-PCI/DES to mLcx, normal EF.  . Diverticulosis of colon    2007  . GERD 02/23/2007  . GERD (gastroesophageal reflux disease)   . Hyperlipidemia   . Hypertension   . OCD (obsessive compulsive disorder) 01/23/2016   Ocd/hoarding issues- psychiatristis- [place] started on zoloft 100mg --> paxil  . Personal history of colonic polyps 03/14/2014   08/2005. Benign. 10 year repeat. 2010 colonoscopy in record was entered erroneously.    [name] PSORIASIS 02/10/2007   Topical therapy.     . Scalp psoriasis   . SQUAMOUS CELL CARCINOMA OF SKIN SITE UNSPECIFIED 08/24/2010   Annotation: face    Patient Active Problem List   Diagnosis Date Noted  . CAD S/P percutaneous coronary angioplasty 02/24/2017    Priority: High  . Alcoholism in recovery ([place]) 02/03/2017    Priority: High  . Alcoholic peripheral neuropathy ([place]) 02/09/2019    Priority: Medium  . Thiamine deficiency 04/28/2017    Priority: Medium  . Tremor 05/06/2016    Priority: Medium  . OCD (obsessive compulsive disorder) 01/23/2016    Priority: Medium  . Essential hypertension 03/11/2014    Priority: Medium  . Hyperlipidemia with target low density lipoprotein (LDL) cholesterol less than 70 mg/dL 02/10/2007    Priority: Medium  . PSORIASIS 02/10/2007    Priority: Medium  . S/P cardiac  catheterization 02/24/2017    Priority: Low  . Personal history of colonic polyps 03/14/2014    Priority: Low  . Anal fissure 08/17/2012    Priority: Low  . SQUAMOUS CELL CARCINOMA OF SKIN SITE UNSPECIFIED 08/24/2010    Priority: Low  . GERD 02/23/2007    Priority: Low  . BACK PAIN, CHRONIC 02/10/2007    Priority: Low  . Abnormal cardiovascular stress test 02/20/2017   Past Surgical History:  Procedure Laterality Date  . COLONOSCOPY W/ POLYPECTOMY     2007 [name]  . CORONARY STENT INTERVENTION N/A 02/24/2017   Procedure: Coronary Stent Intervention;  Surgeon: Leonie Man, MD;  Location: [facility];  Service: Cardiovascular;  Laterality: N/A;  . LEFT HEART CATH AND CORONARY ANGIOGRAPHY N/A 02/24/2017   Procedure: Left Heart Cath and Coronary Angiography;  Surgeon: Leonie Man, MD;  Location: [facility];  Service: Cardiovascular;  Laterality: N/A;  . SKIN CANCER EXCISION     squamous cell CA - nose  . TONSILLECTOMY      Family History  Problem Relation Age of Onset  . Lung cancer Father   . Stroke Mother   . Hypertension Mother     Medications- reviewed and updated Current Outpatient Medications  Medication Sig Dispense Refill  . aspirin EC 81 MG tablet Take 81 mg by mouth daily.    . bisoprolol-hydrochlorothiazide (ZIAC) 2.5-6.25 MG tablet TAKE 1 TABLET BY MOUTH EVERY DAY 90 tablet 3  . clobetasol (TEMOVATE) 0.05 % external solution Apply 1 application  topically at bedtime as needed (scalp).   5  . clobetasol cream (TEMOVATE) [id number] % Apply 1 application topically 2 (two) times daily as needed (abdomen).   3  . clotrimazole-betamethasone (LOTRISONE) cream Apply 1 application topically 2 (two) times daily as needed. psoriass    . desonide (DESONATE) 0.05 % gel Apply 1 application topically 2 (two) times daily as needed. Skin lesions    . fluocinolone (VANOS) 0.01 % cream Apply 1 application topically daily as needed.     . hydrocortisone (ANUSOL-HC)  2.5 % rectal cream Place 1 application rectally 2 (two) times daily as needed for hemorrhoids or anal itching.    . hydrocortisone 2.5 % cream Apply 1 application topically 2 (two) times daily as needed (face).   5  . Icosapent Ethyl (VASCEPA) 1 g CAPS Take 2 capsules ( 2 grams ) twice a day (Patient taking differently: 1 capsule qAM.) 120 capsule 6  . pantoprazole (PROTONIX) 40 MG tablet TAKE 1 TABLET BY MOUTH EVERY DAY (Patient taking differently: Take 40 mg by mouth 3 (three) times a week. Mondays, Wednesdays, and Fridays) 90 tablet 3  . Probiotic Product (PROBIOTIC DAILY PO) Take 1 tablet by mouth 3 (three) times a week. Tuesdays, Thursdays, and Saturdays    . rosuvastatin (CRESTOR) 40 MG tablet TAKE 1 TABLET BY MOUTH EVERY DAY 90 tablet 0  . silver sulfADIAZINE (SILVADENE) 1 % cream Apply 1 application topically 2 (two) times daily as needed (groin).   5   No current facility-administered medications for this visit.     Allergies-reviewed and updated No Known Allergies  Social History   Social History Narrative   Married 42 years in 2015. 2 kids, 1 grandchild that is 50.80 years old.       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       Hobbies: ride bike, ski in winter    Objective  Objective:  BP 110/72   Pulse 67   Temp 98 F (36.7 C)   Ht 5\' 11"  (1.803 m)   Wt 226 lb 3.2 oz (102.6 kg)   SpO2 93%   BMI 31.55 kg/m  Gen: NAD, resting comfortably HEENT:TM normal, PERRLA Neck: no thyromegaly CV: RRR no murmurs rubs or gallops Lungs: CTAB no crackles, wheeze, rhonchi Abdomen: soft/nontender/nondistended/normal bowel sounds. No rebound or guarding.  Ext: no edema, 2+ PT pulses Skin: warm, dry Neuro: grossly normal, moves all extremities, normal gait and speech   Assessment and Plan  73 y.o. male presenting for annual physical.  Health Maintenance counseling: 1. Anticipatory guidance: Patient counseled regarding regular dental exams -q6-12  months months, eye exams -yearly-states due,  avoiding smoking and second hand smoke , limiting alcohol to 2 beverages per day - slightly higher lately.   2. Risk factor reduction:  Advised patient of need for regular exercise and diet rich and fruits and vegetables to reduce risk of heart attack and stroke. Exercise- not doing well lately- encouraged regular exercise. Diet-has been tough with covid and gained weight recently- encouraged healthy diet.  Wt Readings from Last 3 Encounters:  05/06/19 226 lb 3.2 oz (102.6 kg)  02/09/19 219 lb (99.3 kg)  10/28/18 227 lb 12.8 oz (103.3 kg)  3. Immunizations/screenings/ancillary studies-influenza vaccination today.  Also discussed Shingrix at pharmacy-   Immunization History  Administered Date(s) Administered  . DTP 08/10/2007  . Fluad Quad(high Dose 65+) 05/06/2019  . Influenza Split 06/08/2012  . Influenza Whole 08/21/2009  . Influenza, High  Dose Seasonal PF 05/12/2013, 05/06/2016, 05/05/2018  . Influenza, Seasonal, Injecte, Preservative Fre 05/23/2013  . Influenza-Unspecified 05/01/2015  . Pneumococcal Conjugate-13 08/30/2013  . Pneumococcal Polysaccharide-23 03/11/2014  . Td 07/29/1998, 08/15/2008  . Tdap 10/07/2016  4. Prostate cancer screening- low risk prior PSA trend-unless has significant change in urinary symptoms will not continue PSA checks- stable nocturia 3x a night  Lab Results  Component Value Date   PSA 0.44 05/05/2018   PSA 0.40 08/04/2015   PSA 0.44 03/07/2014   5. Colon cancer screening - adenomatous colon polyp March 2017- due for follow-up-encouraged him to call 3 months ago.  Offered referral today- he states had visit planned bu tDr. Danis had to reschedule- he is waiting on call to reschedule  6. Skin cancer screening-dermatology as needed for psoriasis-have encouraged full skin exam.- has been a while. advised regular sunscreen use. Denies worrisome, changing, or new skin lesions.  7.  Never smoker-not a candidate for  lung cancer screening.  Status of chronic or acute concerns   Had Medicare wellness visit with me on February 09, 2019   CAD S/P percutaneous coronary angioplasty-patient is asymptomatic.  Compliant with aspirin and statin.  Hyperlipidemia with target low density lipoprotein (LDL) cholesterol less than 70 mg/dL- controlled on Crestor 40 mg-had full lipid panel in July.  Also on vascepa- only does 1 in AM (quarter of dose)- very expensive  Lab Results  Component Value Date   CHOL 159 02/05/2019   HDL 62.60 02/05/2019   LDLCALC 70 02/05/2019   LDLDIRECT 76.0 05/05/2018   TRIG 130.0 02/05/2019   CHOLHDL 3 02/05/2019    Alcoholism in recovery ([facility])-patient has slipped some up to 3 drinks per day but have encouraged full cessation. Stressed at minimum pushing to 1-2 per day. Stopped naltrexone through Dr. Clovis Pu as didn't seem to help.  Alcoholic peripheral neuropathy ([facility])-stable without treatment, continue to monitor   Thiamine deficiency-compliant with thiamine through [facility] in the past- needs to restart- encouraged him   Macrocytosis/high risk medication use- we will get B12 with labs today along with methylmalonic acid and folate RBC. Likely alcohol related though Lab Results  Component Value Date   WBC 6.1 02/05/2019   HGB 14.8 02/05/2019   HCT 44.5 02/05/2019   MCV 103.4 (H) 02/05/2019   PLT 178.0 02/05/2019   PSORIASIS-has clobetasol as needed  Obsessive-compulsive disorder, unspecified type-treated by psychiatry in the past. Not currently having issues  Essential hypertension- controlled on Ziac 2.5-6.25mg     GERD-controlled on Protonix 40mg  3 days a week-discussed possibly trying to reduce to 20 mg - declines now. May try twice a week  Right knee pain at [facility]-advised trial of Voltaren gel over ibuprofen given cardiac risk- mild relief   Allergies in the fall- post nasal drip, some congestion in AM- going on for months. Sounds like may be in cycle of rebound runny nose  with afrin.  Advised stop afrin and start flonase  Slight abdominal pain LLQ before BM resolves after BM- continue to monitor.   Easy bruising bleeding noted- likely related to aspirin- senile purpura technically. Stable- will monitor. Will check cbc/platelets.   Recommended follow up: 6 month  Lab/Order associations:not  Fasting- OJ this AM   ICD-10-CM   1. Preventative health care  Z00.00 Comprehensive metabolic panel    Lipid panel    Vitamin B12    Folate RBC    Methylmalonic Acid    CBC with Differential/Platelet  2. CAD S/P percutaneous coronary angioplasty  I25.10 Comprehensive metabolic panel   [id number] Lipid panel    CBC with Differential/Platelet  3. Alcoholism in recovery ([place])  F10.21   4. Thiamine deficiency  E51.9   5. PSORIASIS  L40.8   6. Obsessive-compulsive disorder, unspecified type  F42.9   7. Hyperlipidemia with target low density lipoprotein (LDL) cholesterol less than 70 mg/dL  E78.5 Comprehensive metabolic panel    Lipid panel  8. Essential hypertension  I10   9. Alcoholic peripheral neuropathy ([facility]CC)  G62.1   10. Macrocytosis  D75.89 Vitamin B12    Folate RBC    Methylmalonic Acid  11. Need for immunization against influenza  Z23 Flu Vaccine QUAD High Dose(Fluad)    No orders of the defined types were placed in this encounter.   Return precautions advised.  Garret Reddish, MD

## 2019-05-05 NOTE — Telephone Encounter (Signed)
Dr. Loletha Carrow reviewed chart and is not clear why an office visit was scheduled. He is due for a surveillance colonoscopy after the last procedure in March 2017. Pt can be scheduled directly for the procedure. Of course if he wants to discuss or is having any other symptoms then a routine office visit would be appropriate. Called and spoke to Charles Villa and he chose to discuss with Dr. Yong Channel at his physical tomorrow 05-06-2019 and call back.

## 2019-05-05 NOTE — Patient Instructions (Addendum)
Health Maintenance Due  Topic Date Due  . COLONOSCOPY - in process of scheduling 10/23/2018  . INFLUENZA VACCINE -today 02/27/2019   Please check with your pharmacy to see if they have the shingrix vaccine. If they do- please get this immunization and update [place] by phone call or mychart with dates you receive the vaccine  Try to get off phenylephrine/afrin - need at least 2 week trial off to see what your new normal is. When you stop can do flonase/fluticasone over the counter.   Please stop by lab before you go If you do not have mychart- we will call you about results within 5 business days of [place] receiving them.  If you have mychart- we will send your results within 3 business days of [place] receiving them.  If abnormal or we want to clarify a result, we will call or mychart you to make sure you receive the message.  If you have questions or concerns or don't hear within 5-7 days, please send [place] a message or call us.

## 2019-05-06 ENCOUNTER — Encounter: Payer: Self-pay | Admitting: Family Medicine

## 2019-05-06 ENCOUNTER — Other Ambulatory Visit: Payer: Self-pay

## 2019-05-06 ENCOUNTER — Ambulatory Visit (INDEPENDENT_AMBULATORY_CARE_PROVIDER_SITE_OTHER): Payer: Medicare HMO | Admitting: Family Medicine

## 2019-05-06 VITALS — BP 110/72 | HR 67 | Temp 98.0°F | Ht 71.0 in | Wt 226.2 lb

## 2019-05-06 DIAGNOSIS — Z0000 Encounter for general adult medical examination without abnormal findings: Secondary | ICD-10-CM

## 2019-05-06 DIAGNOSIS — Z9861 Coronary angioplasty status: Secondary | ICD-10-CM

## 2019-05-06 DIAGNOSIS — D7589 Other specified diseases of blood and blood-forming organs: Secondary | ICD-10-CM | POA: Diagnosis not present

## 2019-05-06 DIAGNOSIS — E785 Hyperlipidemia, unspecified: Secondary | ICD-10-CM | POA: Diagnosis not present

## 2019-05-06 DIAGNOSIS — Z23 Encounter for immunization: Secondary | ICD-10-CM

## 2019-05-06 DIAGNOSIS — E519 Thiamine deficiency, unspecified: Secondary | ICD-10-CM

## 2019-05-06 DIAGNOSIS — R69 Illness, unspecified: Secondary | ICD-10-CM | POA: Diagnosis not present

## 2019-05-06 DIAGNOSIS — L408 Other psoriasis: Secondary | ICD-10-CM | POA: Diagnosis not present

## 2019-05-06 DIAGNOSIS — F1021 Alcohol dependence, in remission: Secondary | ICD-10-CM | POA: Diagnosis not present

## 2019-05-06 DIAGNOSIS — D692 Other nonthrombocytopenic purpura: Secondary | ICD-10-CM

## 2019-05-06 DIAGNOSIS — G621 Alcoholic polyneuropathy: Secondary | ICD-10-CM

## 2019-05-06 DIAGNOSIS — F429 Obsessive-compulsive disorder, unspecified: Secondary | ICD-10-CM

## 2019-05-06 DIAGNOSIS — I10 Essential (primary) hypertension: Secondary | ICD-10-CM

## 2019-05-06 DIAGNOSIS — I2510 Atherosclerotic heart disease of native coronary artery without angina pectoris: Secondary | ICD-10-CM | POA: Diagnosis not present

## 2019-05-06 LAB — COMPREHENSIVE METABOLIC PANEL
ALT: 27 U/L (ref 0–53)
AST: 30 U/L (ref 0–37)
Albumin: 4.1 g/dL (ref 3.5–5.2)
Alkaline Phosphatase: 42 U/L (ref 39–117)
BUN: 16 mg/dL (ref 6–23)
CO2: 26 mEq/L (ref 19–32)
Calcium: 9.4 mg/dL (ref 8.4–10.5)
Chloride: 103 mEq/L (ref 96–112)
Creatinine, Ser: 1.14 mg/dL (ref 0.40–1.50)
GFR: 63.24 mL/min (ref >60.00)
Glucose, Bld: 99 mg/dL (ref 70–99)
Potassium: 3.9 mEq/L (ref 3.5–5.1)
Sodium: 138 mEq/L (ref 135–145)
Total Bilirubin: 0.7 mg/dL (ref 0.2–1.2)
Total Protein: 7 g/dL (ref 6.0–8.3)

## 2019-05-06 LAB — VITAMIN B12: Vitamin B-12: 409 pg/mL (ref 211–911)

## 2019-05-07 ENCOUNTER — Encounter: Payer: Self-pay | Admitting: Family Medicine

## 2019-05-07 LAB — CBC WITH DIFFERENTIAL/PLATELET
Basophils Absolute: 0.1 10*3/uL (ref 0.0–0.1)
Basophils Relative: 0.9 % (ref 0.0–3.0)
Eosinophils Absolute: 0.1 10*3/uL (ref 0.0–0.7)
Eosinophils Relative: 1.4 % (ref 0.0–5.0)
HCT: 44.1 % (ref 39.0–52.0)
Hemoglobin: 15.1 g/dL (ref 13.0–17.0)
Lymphocytes Relative: 24.7 % (ref 12.0–46.0)
Lymphs Abs: 1.5 10*3/uL (ref 0.7–4.0)
MCHC: 34.2 g/dL (ref 30.0–36.0)
MCV: 102.2 fl — ABNORMAL HIGH (ref 78.0–100.0)
Monocytes Absolute: 0.7 10*3/uL (ref 0.1–1.0)
Monocytes Relative: 10.9 % (ref 3.0–12.0)
Neutro Abs: 3.9 10*3/uL (ref 1.4–7.7)
Neutrophils Relative %: 62.1 % (ref 43.0–77.0)
Platelets: 166 10*3/uL (ref 150.0–400.0)
RBC: 4.31 Mil/uL (ref 4.22–5.81)
RDW: 13.7 % (ref 11.5–15.5)
WBC: 6.2 10*3/uL (ref 4.0–10.5)

## 2019-05-09 LAB — METHYLMALONIC ACID, SERUM: Methylmalonic Acid, Quant: 89 nmol/L (ref 87–318)

## 2019-05-09 LAB — FOLATE RBC: RBC Folate: 694 ng/mL RBC (ref >280)

## 2019-05-13 ENCOUNTER — Other Ambulatory Visit: Payer: Self-pay | Admitting: Family Medicine

## 2019-05-20 ENCOUNTER — Ambulatory Visit: Payer: Medicare HMO | Admitting: Gastroenterology

## 2019-05-31 DIAGNOSIS — R69 Illness, unspecified: Secondary | ICD-10-CM | POA: Diagnosis not present

## 2019-06-16 DIAGNOSIS — R69 Illness, unspecified: Secondary | ICD-10-CM | POA: Diagnosis not present

## 2019-08-02 ENCOUNTER — Other Ambulatory Visit: Payer: Self-pay | Admitting: Family Medicine

## 2019-08-04 ENCOUNTER — Other Ambulatory Visit: Payer: Self-pay | Admitting: Internal Medicine

## 2019-10-12 ENCOUNTER — Ambulatory Visit (AMBULATORY_SURGERY_CENTER): Payer: Medicare HMO | Admitting: *Deleted

## 2019-10-12 ENCOUNTER — Other Ambulatory Visit: Payer: Self-pay

## 2019-10-12 ENCOUNTER — Ambulatory Visit: Payer: Medicare HMO | Admitting: Gastroenterology

## 2019-10-12 VITALS — Temp 96.9°F | Ht 71.0 in | Wt 235.0 lb

## 2019-10-12 DIAGNOSIS — Z86010 Personal history of colonic polyps: Secondary | ICD-10-CM

## 2019-10-12 NOTE — Progress Notes (Signed)
No egg or soy allergy known to patient  No issues with past sedation with any surgeries  or procedures, no intubation problems  No diet pills per patient No home 02 use per patient  No blood thinners per patient  Pt denies issues with constipation  No A fib or A flutter  EMMI video sent to pt's e mail   Due to the COVID-19 pandemic we are asking patients to follow these guidelines. Please only bring one care partner. Please be aware that your care partner may wait in the car in the parking lot or if they feel like they will be too hot to wait in the car, they may wait in the lobby on the 4th floor. All care partners are required to wear a mask the entire time (we do not have any that we can provide them), they need to practice social distancing, and we will do a Covid check for all patient's and care partners when you arrive. Also we will check their temperature and your temperature. If the care partner waits in their car they need to stay in the parking lot the entire time and we will call them on their cell phone when the patient is ready for discharge so they can bring the car to the front of the building. Also all patient's will need to wear a mask into building. Pt completed covid vaccine series 10-01-2019- no pre screen cov test needed   Pt states for the last year he has been having lower abdominal pain when he feels full , like he has to have a BM- Wants dr Loletha Carrow to focus on lower colon

## 2019-10-14 ENCOUNTER — Encounter: Payer: Self-pay | Admitting: Gastroenterology

## 2019-10-21 ENCOUNTER — Other Ambulatory Visit: Payer: Self-pay

## 2019-10-21 ENCOUNTER — Ambulatory Visit (AMBULATORY_SURGERY_CENTER): Payer: Medicare HMO | Admitting: Gastroenterology

## 2019-10-21 ENCOUNTER — Encounter: Payer: Self-pay | Admitting: Gastroenterology

## 2019-10-21 VITALS — BP 116/63 | HR 49 | Temp 97.5°F | Resp 17 | Ht 71.0 in | Wt 235.0 lb

## 2019-10-21 DIAGNOSIS — D120 Benign neoplasm of cecum: Secondary | ICD-10-CM | POA: Diagnosis not present

## 2019-10-21 DIAGNOSIS — D122 Benign neoplasm of ascending colon: Secondary | ICD-10-CM

## 2019-10-21 DIAGNOSIS — Z86010 Personal history of colonic polyps: Secondary | ICD-10-CM | POA: Diagnosis not present

## 2019-10-21 DIAGNOSIS — D123 Benign neoplasm of transverse colon: Secondary | ICD-10-CM | POA: Diagnosis not present

## 2019-10-21 DIAGNOSIS — D128 Benign neoplasm of rectum: Secondary | ICD-10-CM

## 2019-10-21 MED ORDER — SODIUM CHLORIDE 0.9 % IV SOLN: 500.0000 mL | Freq: Once | INTRAVENOUS | Status: DC

## 2019-10-21 NOTE — Op Note (Signed)
[facility] Patient Name: Charles Villa Procedure Date: 10/21/2019 10:58 AM MRN: [id number] Endoscopist: [place]. Loletha Carrow , MD Age: 74 Referring MD:  Date of Birth: 13-Jan-1946 Gender: Male Account #: [id number] Procedure:                Colonoscopy Indications:              Surveillance: Personal history of adenomatous                            polyps on last colonoscopy > 3 years ago Medicines:                Monitored Anesthesia Care Procedure:                Pre-Anesthesia Assessment:                           - Prior to the procedure, a History and Physical                            was performed, and patient medications and                            allergies were reviewed. The patient's tolerance of                            previous anesthesia was also reviewed. The risks                            and benefits of the procedure and the sedation                            options and risks were discussed with the patient.                            All questions were answered, and informed consent                            was obtained. Prior Anticoagulants: The patient has                            taken no previous anticoagulant or antiplatelet                            agents. ASA Grade Assessment: II - A patient with                            mild systemic disease. After reviewing the risks                            and benefits, the patient was deemed in                            satisfactory condition to undergo the procedure.  After obtaining informed consent, the colonoscope                            was passed under direct vision. Throughout the                            procedure, the patient's blood pressure, pulse, and                            oxygen saturations were monitored continuously. The                            Colonoscope was introduced through the anus and                            advanced to the the  cecum, identified by                            appendiceal orifice and ileocecal valve. The                            colonoscopy was performed without difficulty. The                            patient tolerated the procedure well. The quality                            of the bowel preparation was good. The ileocecal                            valve, appendiceal orifice, and rectum were                            photographed. The quality of the bowel preparation                            was evaluated using the BBPS [facility] Bowel                            Preparation Scale) with scores of: Right Colon = 2,                            Transverse Colon = 2 and Left Colon = 2. The total                            BBPS score equals 6. Scope In: 11:07:12 AM Scope Out: 11:31:48 AM Scope Withdrawal Time: 0 hours 19 minutes 52 seconds  Total Procedure Duration: 0 hours 24 minutes 36 seconds  Findings:                 The perianal and digital rectal examinations were                            normal.  Multiple diverticula were found in the left colon                            and right colon.                           Three flat and sessile polyps were found in the                            ascending colon, cecum and ileocecal valve. The                            polyps were diminutive in size. These polyps were                            removed with a cold biopsy forceps. Resection and                            retrieval were complete. (Jar 1)                           A diminutive polyp was found in the transverse                            colon. The polyp was flat. The polyp was removed                            with a cold biopsy forceps. Resection and retrieval                            were complete. (Jar 2)                           A 4 mm polyp was found in the rectum. The polyp was                            sessile. The polyp was removed with a  cold snare.                            Resection and retrieval were complete. (Jar 2)                           The exam was otherwise without abnormality on                            direct and retroflexion views. Complications:            No immediate complications. Estimated Blood Loss:     Estimated blood loss was minimal. Impression:               - Diverticulosis in the left colon and in the right                            colon.                           -  Three diminutive polyps in the ascending colon,                            in the cecum and at the ileocecal valve, removed                            with a cold biopsy forceps. Resected and retrieved.                           - One diminutive polyp in the transverse colon,                            removed with a cold biopsy forceps. Resected and                            retrieved.                           - One 4 mm polyp in the rectum, removed with a cold                            snare. Resected and retrieved.                           - The examination was otherwise normal on direct                            and retroflexion views. Recommendation:           - Patient has a contact number available for                            emergencies. The signs and symptoms of potential                            delayed complications were discussed with the                            patient. Return to normal activities tomorrow.                            Written discharge instructions were provided to the                            patient.                           - Resume previous diet.                           - Continue present medications.                           - Await pathology results.                           -  Repeat colonoscopy is recommended for                            surveillance. The colonoscopy date will be                            determined after pathology results from today's                             exam become available for review. Jeanne Diefendorf L. Loletha Carrow, MD 10/21/2019 11:38:23 AM This report has been signed electronically.

## 2019-10-21 NOTE — Progress Notes (Signed)
pt tolerated well. VSS. awake and to recovery. Report given to RN.  

## 2019-10-21 NOTE — Progress Notes (Signed)
Pt's states no medical or surgical changes since previsit or office visit.  Temp-lc  V/s-cw

## 2019-10-21 NOTE — Patient Instructions (Addendum)
Handouts Provided:  Polyps and Diverticulosis ° °YOU HAD AN ENDOSCOPIC PROCEDURE TODAY AT THE [facility]:   Refer to the procedure report that was given to you for any specific questions about what was found during the examination.  If the procedure report does not answer your questions, please call your gastroenterologist to clarify.  If you requested that your care partner not be given the details of your procedure findings, then the procedure report has been included in a sealed envelope for you to review at your convenience later. ° °YOU SHOULD EXPECT: Some feelings of bloating in the abdomen. Passage of more gas than usual.  Walking can help get rid of the air that was put into your GI tract during the procedure and reduce the bloating. If you had a lower endoscopy (such as a colonoscopy or flexible sigmoidoscopy) you may notice spotting of blood in your stool or on the toilet paper. If you underwent a bowel prep for your procedure, you may not have a normal bowel movement for a few days. ° °Please Note:  You might notice some irritation and congestion in your nose or some drainage.  This is from the oxygen used during your procedure.  There is no need for concern and it should clear up in a day or so. ° °SYMPTOMS TO REPORT IMMEDIATELY: ° °Following lower endoscopy (colonoscopy or flexible sigmoidoscopy): ° Excessive amounts of blood in the stool ° Significant tenderness or worsening of abdominal pains ° Swelling of the abdomen that is new, acute ° Fever of 100°F or higher ° °For urgent or emergent issues, a gastroenterologist can be reached at any hour by calling [phone]. °Do not use MyChart messaging for urgent concerns.  ° ° °DIET:  We do recommend a small meal at first, but then you may proceed to your regular diet.  Drink plenty of fluids but you should avoid alcoholic beverages for 24 hours. ° °ACTIVITY:  You should plan to take it easy for the rest of today and you should NOT DRIVE  or use heavy machinery until tomorrow (because of the sedation medicines used during the test).   ° °FOLLOW UP: °Our staff will call the number listed on your records 48-72 hours following your procedure to check on you and address any questions or concerns that you may have regarding the information given to you following your procedure. If we do not reach you, we will leave a message.  We will attempt to reach you two times.  During this call, we will ask if you have developed any symptoms of COVID 19. If you develop any symptoms (ie: fever, flu-like symptoms, shortness of breath, cough etc.) before then, please call [phone].  If you test positive for Covid 19 in the 2 weeks post procedure, please call and report this information to us.   ° °If any biopsies were taken you will be contacted by phone or by letter within the next 1-3 weeks.  Please call us at [phone] if you have not heard about the biopsies in 3 weeks.  ° ° °SIGNATURES/CONFIDENTIALITY: °You and/or your care partner have signed paperwork which will be entered into your electronic medical record.  These signatures attest to the fact that that the information above on your After Visit Summary has been reviewed and is understood.  Full responsibility of the confidentiality of this discharge information lies with you and/or your care-partner. ° °

## 2019-10-25 ENCOUNTER — Telehealth: Payer: Self-pay | Admitting: *Deleted

## 2019-10-25 NOTE — Telephone Encounter (Signed)
1. Have you developed a fever since your procedure? no  2.   Have you had an respiratory symptoms (SOB or cough) since your procedure? no  3.   Have you tested positive for COVID 19 since your procedure no  4.   Have you had any family members/close contacts diagnosed with the COVID 19 since your procedure?  no   If yes to any of these questions please route to Joylene Yareth, RN and Erenest Rasher, RN Follow up Call-  Call back number 10/21/2019  Post procedure Call Back phone  # [phone]  Permission to leave phone message Yes  Some recent data might be hidden     Patient questions:  Do you have a fever, pain , or abdominal swelling? No. Pain Score  0 *  Have you tolerated food without any problems? Yes.    Have you been able to return to your normal activities? Yes.    Do you have any questions about your discharge instructions: Diet   No. Medications  No. Follow up visit  No.  Do you have questions or concerns about your Care? No.  Actions: * If pain score is 4 or above: No action needed, pain <4.

## 2019-10-31 ENCOUNTER — Encounter: Payer: Self-pay | Admitting: Gastroenterology

## 2019-11-02 ENCOUNTER — Encounter: Payer: Self-pay | Admitting: Family Medicine

## 2019-11-02 DIAGNOSIS — Z86010 Personal history of colonic polyps: Secondary | ICD-10-CM | POA: Insufficient documentation

## 2019-11-02 DIAGNOSIS — D126 Benign neoplasm of colon, unspecified: Secondary | ICD-10-CM

## 2019-11-02 HISTORY — DX: Benign neoplasm of colon, unspecified: D12.6

## 2019-11-15 ENCOUNTER — Telehealth: Payer: Self-pay | Admitting: Family Medicine

## 2019-11-15 ENCOUNTER — Other Ambulatory Visit: Payer: Self-pay

## 2019-11-15 ENCOUNTER — Encounter: Payer: Self-pay | Admitting: Family Medicine

## 2019-11-15 ENCOUNTER — Ambulatory Visit (INDEPENDENT_AMBULATORY_CARE_PROVIDER_SITE_OTHER): Payer: Medicare HMO | Admitting: Family Medicine

## 2019-11-15 VITALS — BP 102/64 | HR 78 | Temp 97.3°F | Ht 71.0 in | Wt 225.2 lb

## 2019-11-15 DIAGNOSIS — R69 Illness, unspecified: Secondary | ICD-10-CM | POA: Diagnosis not present

## 2019-11-15 DIAGNOSIS — Z79899 Other long term (current) drug therapy: Secondary | ICD-10-CM | POA: Diagnosis not present

## 2019-11-15 DIAGNOSIS — D692 Other nonthrombocytopenic purpura: Secondary | ICD-10-CM | POA: Diagnosis not present

## 2019-11-15 DIAGNOSIS — G621 Alcoholic polyneuropathy: Secondary | ICD-10-CM

## 2019-11-15 DIAGNOSIS — I10 Essential (primary) hypertension: Secondary | ICD-10-CM

## 2019-11-15 DIAGNOSIS — L989 Disorder of the skin and subcutaneous tissue, unspecified: Secondary | ICD-10-CM

## 2019-11-15 DIAGNOSIS — D7589 Other specified diseases of blood and blood-forming organs: Secondary | ICD-10-CM | POA: Diagnosis not present

## 2019-11-15 DIAGNOSIS — E785 Hyperlipidemia, unspecified: Secondary | ICD-10-CM | POA: Diagnosis not present

## 2019-11-15 DIAGNOSIS — F1021 Alcohol dependence, in remission: Secondary | ICD-10-CM

## 2019-11-15 LAB — COMPREHENSIVE METABOLIC PANEL
ALT: 28 U/L (ref 0–53)
AST: 36 U/L (ref 0–37)
Albumin: 4 g/dL (ref 3.5–5.2)
Alkaline Phosphatase: 40 U/L (ref 39–117)
BUN: 13 mg/dL (ref 6–23)
CO2: 25 mEq/L (ref 19–32)
Calcium: 9.2 mg/dL (ref 8.4–10.5)
Chloride: 103 mEq/L (ref 96–112)
Creatinine, Ser: 1.06 mg/dL (ref 0.40–1.50)
GFR: 68.67 mL/min (ref >60.00)
Glucose, Bld: 95 mg/dL (ref 70–99)
Potassium: 4.4 mEq/L (ref 3.5–5.1)
Sodium: 136 mEq/L (ref 135–145)
Total Bilirubin: 0.9 mg/dL (ref 0.2–1.2)
Total Protein: 6.7 g/dL (ref 6.0–8.3)

## 2019-11-15 LAB — LDL CHOLESTEROL, DIRECT: Direct LDL: 55 mg/dL

## 2019-11-15 NOTE — Telephone Encounter (Signed)
Chart UTD

## 2019-11-15 NOTE — Progress Notes (Signed)
Phone [phone] In person visit   Subjective:   Charles Villa is a 74 y.o. year old very pleasant male patient who presents for/with See problem oriented charting Chief Complaint  Patient presents with  . Follow-up  . Hyperlipidemia    This visit occurred during the SARS-CoV-2 public health emergency.  Safety protocols were in place, including screening questions prior to the visit, additional usage of staff PPE, and extensive cleaning of exam room while observing appropriate contact time as indicated for disinfecting solutions.   Past Medical History-  Patient Active Problem List   Diagnosis Date Noted  . CAD S/P percutaneous coronary angioplasty 02/24/2017    Priority: High  . Alcoholism in recovery ([place]) 02/03/2017    Priority: High  . History of adenomatous polyp of colon 11/02/2019    Priority: Medium  . Alcoholic peripheral neuropathy ([place]) 02/09/2019    Priority: Medium  . Thiamine deficiency 04/28/2017    Priority: Medium  . Tremor 05/06/2016    Priority: Medium  . OCD (obsessive compulsive disorder) 01/23/2016    Priority: Medium  . Essential hypertension 03/11/2014    Priority: Medium  . Hyperlipidemia with target low density lipoprotein (LDL) cholesterol less than 70 mg/dL 02/10/2007    Priority: Medium  . PSORIASIS 02/10/2007    Priority: Medium  . S/P cardiac catheterization 02/24/2017    Priority: Low  . Anal fissure 08/17/2012    Priority: Low  . SQUAMOUS CELL CARCINOMA OF SKIN SITE UNSPECIFIED 08/24/2010    Priority: Low  . GERD 02/23/2007    Priority: Low  . BACK PAIN, CHRONIC 02/10/2007    Priority: Low  . Senile purpura ([place]) 05/06/2019  . Abnormal cardiovascular stress test 02/20/2017    Medications- reviewed and updated Current Outpatient Medications  Medication Sig Dispense Refill  . aspirin EC 81 MG tablet Take 81 mg by mouth daily.    . bisoprolol-hydrochlorothiazide (ZIAC) 2.5-6.25 MG tablet TAKE 1 TABLET BY MOUTH EVERY DAY 90  tablet 3  . clobetasol (TEMOVATE) 0.05 % external solution Apply 1 application topically at bedtime as needed (scalp).   5  . clobetasol cream (TEMOVATE) [id number] % Apply 1 application topically 2 (two) times daily as needed (abdomen).   3  . clotrimazole-betamethasone (LOTRISONE) cream Apply 1 application topically 2 (two) times daily as needed. psoriass    . desonide (DESONATE) 0.05 % gel Apply 1 application topically 2 (two) times daily as needed. Skin lesions    . fluocinolone (VANOS) 0.01 % cream Apply 1 application topically daily as needed.     . hydrocortisone (ANUSOL-HC) 2.5 % rectal cream Place 1 application rectally 2 (two) times daily as needed for hemorrhoids or anal itching.    . hydrocortisone 2.5 % cream Apply 1 application topically 2 (two) times daily as needed (face).   5  . Icosapent Ethyl (VASCEPA) 1 g CAPS Take 2 capsules ( 2 grams ) twice a day (Patient taking differently: 1 capsule qAM.) 120 capsule 6  . pantoprazole (PROTONIX) 40 MG tablet TAKE 1 TABLET BY MOUTH EVERY DAY (Patient taking differently: Take 40 mg by mouth. 3 x a week) 90 tablet 3  . Probiotic Product (PROBIOTIC DAILY PO) Take 1 tablet by mouth 3 (three) times a week. Tuesdays, Thursdays, and Saturdays    . rosuvastatin (CRESTOR) 40 MG tablet TAKE 1 TABLET BY MOUTH EVERY DAY 90 tablet 1  . silver sulfADIAZINE (SILVADENE) 1 % cream Apply 1 application topically 2 (two) times daily as needed (groin).  5   No current facility-administered medications for this visit.     Objective:  BP 102/64   Pulse 78   Temp (!) 97.3 F (36.3 C)   Ht 5\' 11"  (1.803 m)   Wt 225 lb 3.2 oz (102.2 kg)   SpO2 98%   BMI 31.41 kg/m  Gen: NAD, resting comfortably CV: RRR no murmurs rubs or gallops Lungs: CTAB no crackles, wheeze, rhonchi  Ext: no edema Skin: warm, dry Hyperkeratotic growth 1 x 1 cm- pared down and seemed to have central area 1-2 mm- after paring he had improved pain    Assessment and Plan    #hyperlipidemia/CAD S: compliant with Vascepa as well as rosuvastatin 40 mg daily  Denies chest pain or shortness of breath.  He is compliant with aspirin for CAD. Lab Results  Component Value Date   CHOL 159 02/05/2019   HDL 62.60 02/05/2019   LDLCALC 70 02/05/2019   LDLDIRECT 76.0 05/05/2018   TRIG 130.0 02/05/2019   CHOLHDL 3 02/05/2019   A/P: CAD-asymptomatic-continue current medications   For lipids-reasonable control on last check with LDL at 70-continue current medication.  Check direct LDL today  #hypertension S: compliant with Ziac 2.5-6.25 mg. Ranges at home from 110-130s.  BP Readings from Last 3 Encounters:  11/15/19 102/64  10/21/19 116/63  05/06/19 110/72  A/P:  Stable. Continue current medications.     #Alcoholic neuropathy/alcoholism in remission #macrocytosis S: Patient not currently on any medication for this- numbness and tingling in the feet.  Current alcohol intake 2-3 beverages per day.  -Patient is compliant with thiamine -B12, folate, methylmalonic acid levels were normal at last visit A/P: I discussed with patient would strongly prefer for him to cut out alcohol due to underlying neuropathy which is likely related to alcohol.  Also his cell size is large on his red blood cells and this could be related to alcohol intake.  I would like to check a pathologist smear review with labs today to make sure nothing else is going on.   #Senile purpura S: Continues to have easy bruising and bleeding but better than in the past when on stronger blood thinner plavix.  He is compliant with aspirin for CAD A/P: mild issues - continue to monitor - check cbc  Phlegm S:early morning phlegm production that seems to get stuck. Not currently taking anything for this.  A/P: I wonder if the phlegm in the mornings could be related to allergies-I recommended he try Flonase 1 spray each nostril in the morning.  If this is not helpful within 3 weeks could also add Xyzal or  Claritin before bed-if no better in 6 weeks I have asked him to let me know.  # Joint pain S: saw [facility] for right knee but having some pain. Some finger pain as well. Tolerates it or uses ibuprofen.   A/P: Patient with ongoing issues with several joints-considering this is over his knee and his fingers-I think Voltaren gel would be helpful for him and would also be safer considering he has coronary artery disease-I encouraged him to try this more regularly and if he is having a day where he feels like he should use his ibuprofen try the Voltaren gel up to 4 times that day and see if it is a reasonable substitute.  #Hyperkeratotic growth on lateral portion of left foot.  Present for 4 to 6 months and does not seem to be improving.  Has some pain when walking on it or when  it is pressed.  Today we tried to debride the lesion down-may simply be a corn.  Does not obviously look like a wart to me but we will get podiatry's opinion-referral placed today.  He already had a referral set up for Dr. Hewitt-local foot and ankle specialist-I do not think this is primarily an orthopedic issue and so we agreed to place a podiatry referral.  # social update- [place] home sold- that's a little tough with him. Undecided on his boat.   Recommended follow up: Return in about 6 months (around 05/16/2020) for physical or sooner if needed.   Lab/Order associations:   ICD-10-CM   1. Essential hypertension  I10 CBC with Differential/Platelet    Comprehensive metabolic panel    LDL cholesterol, direct  2. Hyperlipidemia with target low density lipoprotein (LDL) cholesterol less than 70 mg/dL  E78.5   3. Alcoholic peripheral neuropathy ([facility])  G62.1   4. Alcoholism in recovery [facility])  F10.21 CANCELED: Vitamin B12  5. Senile purpura ([facility])  D69.2   6. High risk medication use  Z79.899 CANCELED: Vitamin B12  7. Macrocytosis  D75.89 CBC with Differential/Platelet    Pathologist smear review    CANCELED:  Vitamin B12  8. Lesion of skin of foot  L98.9 Ambulatory referral to Podiatry   Return precautions advised.  Garret Reddish, MD

## 2019-11-15 NOTE — Patient Instructions (Addendum)
Health Maintenance Due  Topic Date Due  . COVID-19 Vaccine (1)-will mychart [place] dates and brand that he received in march Never done    Patient with ongoing issues with several joints-considering this is over his knee and his fingers-I think Voltaren gel would be helpful for him and would also be safer considering he has coronary artery disease-I encouraged him to try this more regularly and if he is having a day where he feels like he should use his ibuprofen try the Voltaren gel up to 4 times that day and see if it is a reasonable substitute.  I discussed with patient would strongly prefer for him to cut out alcohol due to underlying neuropathy which is likely related to alcohol.  Also his cell size is large on his red blood cells and this could be related to alcohol intake.  I would like to check a pathologist smear review with labs today to make sure nothing else is going on.   I wonder if the phlegm in the mornings could be related to allergies-I recommended he try Flonase 1 spray each nostril in the morning.  If this is not helpful within 3 weeks could also add Xyzal or Claritin before bed-if no better in 6 weeks I have asked him to let me know.  As always would like you to cut out alcohol-AA is a great resource if you find this difficult  Please stop by lab before you go If you do not have mychart- we will call you about results within 5 business days of [place] receiving them.  If you have mychart- we will send your results within 3 business days of [place] receiving them.  If abnormal or we want to clarify a result, we will call or mychart you to make sure you receive the message.  If you have questions or concerns or don't hear within 5 business days, please send [place] a message or call us.   Recommended follow up: Return in about 6 months (around 05/16/2020) for physical or sooner if needed.

## 2019-11-15 NOTE — Telephone Encounter (Signed)
Pt called stating he received the [employer]. 1st:  09/03/2019  2nd: 09/24/2019

## 2019-11-16 LAB — CBC WITH DIFFERENTIAL/PLATELET
Absolute Monocytes: 671 cells/uL (ref 200–950)
Basophils Absolute: 22 cells/uL (ref 0–200)
Basophils Relative: 0.4 %
Eosinophils Absolute: 99 cells/uL (ref 15–500)
Eosinophils Relative: 1.8 %
HCT: 45.5 % (ref 38.5–50.0)
Hemoglobin: 15.4 g/dL (ref 13.2–17.1)
Lymphs Abs: 1942 cells/uL (ref 850–3900)
MCH: 33.8 pg — ABNORMAL HIGH (ref 27.0–33.0)
MCHC: 33.8 g/dL (ref 32.0–36.0)
MCV: 100 fL (ref 80.0–100.0)
MPV: 10.1 fL (ref 7.5–12.5)
Monocytes Relative: 12.2 %
Neutro Abs: 2767 cells/uL (ref 1500–7800)
Neutrophils Relative %: 50.3 %
Platelets: 198 10*3/uL (ref 140–400)
RBC: 4.55 10*6/uL (ref 4.20–5.80)
RDW: 12.5 % (ref 11.0–15.0)
Total Lymphocyte: 35.3 %
WBC: 5.5 10*3/uL (ref 3.8–10.8)

## 2019-11-16 LAB — PATHOLOGIST SMEAR REVIEW

## 2020-01-06 ENCOUNTER — Other Ambulatory Visit: Payer: Self-pay | Admitting: Family Medicine

## 2020-01-06 ENCOUNTER — Other Ambulatory Visit: Payer: Self-pay | Admitting: Internal Medicine

## 2020-01-06 MED ORDER — ICOSAPENT ETHYL 1 G PO CAPS: ORAL_CAPSULE | ORAL | 0 refills | Status: DC

## 2020-01-06 NOTE — Telephone Encounter (Signed)
Medication refilled as prescribed - vascepa 2gm PO BID with note to schedule OV - last was 08/2018

## 2020-01-06 NOTE — Telephone Encounter (Signed)
New Message  Pt c/o medication issue:  1. Name of Medication: Icosapent Ethyl (VASCEPA) 1 g CAPS  2. How are you currently taking this medication (dosage and times per day)? As written  3. Are you having a reaction (difficulty breathing--STAT)? No  4. What is your medication issue? Pt went to get medication from pharmacy but was denied getting it. Says that he has to get Dr. Lysbeth Penner approval or new prescription to receive medication

## 2020-01-19 ENCOUNTER — Other Ambulatory Visit: Payer: Self-pay | Admitting: Internal Medicine

## 2020-01-19 MED ORDER — ICOSAPENT ETHYL 1 G PO CAPS: 2.0000 g | ORAL_CAPSULE | Freq: Two times a day (BID) | ORAL | 0 refills | Status: DC

## 2020-01-19 NOTE — Telephone Encounter (Signed)
*  STAT* If patient is at the pharmacy, call can be transferred to refill team.   1. Which medications need to be refilled? (please list name of each medication and dose if known) icosapent Ethyl (VASCEPA) 1 g capsule  2. Which pharmacy/location (including street and city if local pharmacy) is medication to be sent to? [address], [place], [place] [address]  3. Do they need a 30 day or 90 day supply? 90 day supply

## 2020-01-24 ENCOUNTER — Telehealth: Payer: Self-pay | Admitting: Internal Medicine

## 2020-01-24 NOTE — Telephone Encounter (Signed)
I agree- I don't see where Eliezer Lofts had done a prior auth last year, so that may be the issue. We need to work on that for him. There is a generic Vascepa, but it only has approval for trigs >500 and no indication for cardiovascular benefit.  Dr Lemmie Evens

## 2020-01-24 NOTE — Telephone Encounter (Signed)
Patient is calling about his icosapent Ethyl (VASCEPA) 1 g capsule, his is wondering if there is a generic form of it. He states to get a refill of 120 capsules of the Vascepa is almost $400. He can't afford that. Please advise.

## 2020-01-25 NOTE — Telephone Encounter (Signed)
PRIOR AUTH STARTED-[place] STATES THAT [employer] IS SECONDARY INSURANCE AND TO HAVE PRIMARY INSURANCE RUN THEN THEY WILL COVER THE REST  CALLED [facility] THEY STATE THAT [employer] IS THE ONLY INSURANCE THAT PT HAS ON THEIR RECORDS THEY WILL CALL AND LET us KNOW WHAT THE OUTCOME IS.

## 2020-01-27 NOTE — Telephone Encounter (Signed)
Pharmacy should indicate if PA is required. If insurance changed from last year, PA status may change. Patient can also apply for [employer]. Will notify him via [place]

## 2020-02-11 ENCOUNTER — Ambulatory Visit (INDEPENDENT_AMBULATORY_CARE_PROVIDER_SITE_OTHER): Payer: Medicare HMO

## 2020-02-11 DIAGNOSIS — Z0000 Encounter for general adult medical examination without abnormal findings: Secondary | ICD-10-CM | POA: Diagnosis not present

## 2020-02-11 NOTE — Patient Instructions (Addendum)
Charles Villa , Thank you for taking time to come for your Medicare Wellness Visit. I appreciate your ongoing commitment to your health goals. Please review the following plan we discussed and let me know if I can assist you in the future.   Screening recommendations/referrals: Colonoscopy: Done 10/21/19 Recommended yearly ophthalmology/optometry visit for glaucoma screening and checkup Recommended yearly dental visit for hygiene and checkup  Vaccinations: Influenza vaccine: Up to date Pneumococcal vaccine: Up to date Tdap vaccine: Up to date Shingles vaccine: Shingrix discussed. Please contact your pharmacy for coverage information.  Covid-19: Completed 09/03/19 & 09/24/19  Advanced directives: Please bring a copy of your health care power of attorney and living will to the office at your convenience.  Conditions/risks identified: Lose 10-15 lbs  Next appointment: Follow up in one year for your annual wellness visit.    Preventive Care 45 Years and Older, Male Preventive care refers to lifestyle choices and visits with your health care provider that can promote health and wellness. What does preventive care include?  A yearly physical exam. This is also called an annual well check.  Dental exams once or twice a year.  Routine eye exams. Ask your health care provider how often you should have your eyes checked.  Personal lifestyle choices, including:  Daily care of your teeth and gums.  Regular physical activity.  Eating a healthy diet.  Avoiding tobacco and drug use.  Limiting alcohol use.  Practicing safe sex.  Taking low doses of aspirin every day.  Taking vitamin and mineral supplements as recommended by your health care provider. What happens during an annual well check? The services and screenings done by your health care provider during your annual well check will depend on your age, overall health, lifestyle risk factors, and family history of  disease. Counseling  Your health care provider may ask you questions about your:  Alcohol use.  Tobacco use.  Drug use.  Emotional well-being.  Home and relationship well-being.  Sexual activity.  Eating habits.  History of falls.  Memory and ability to understand (cognition).  Work and work Statistician. Screening  You may have the following tests or measurements:  Height, weight, and BMI.  Blood pressure.  Lipid and cholesterol levels. These may be checked every 5 years, or more frequently if you are over 47 years old.  Skin check.  Lung cancer screening. You may have this screening every year starting at age 41 if you have a 30-pack-year history of smoking and currently smoke or have quit within the past 15 years.  Fecal occult blood test (FOBT) of the stool. You may have this test every year starting at age 21.  Flexible sigmoidoscopy or colonoscopy. You may have a sigmoidoscopy every 5 years or a colonoscopy every 10 years starting at age 59.  Prostate cancer screening. Recommendations will vary depending on your family history and other risks.  Hepatitis C blood test.  Hepatitis B blood test.  Sexually transmitted disease (STD) testing.  Diabetes screening. This is done by checking your blood sugar (glucose) after you have not eaten for a while (fasting). You may have this done every 1-3 years.  Abdominal aortic aneurysm (AAA) screening. You may need this if you are a current or former smoker.  Osteoporosis. You may be screened starting at age 10 if you are at high risk. Talk with your health care provider about your test results, treatment options, and if necessary, the need for more tests. Vaccines  Your health care  provider may recommend certain vaccines, such as:  Influenza vaccine. This is recommended every year.  Tetanus, diphtheria, and acellular pertussis (Tdap, Td) vaccine. You may need a Td booster every 10 years.  Zoster vaccine. You may  need this after age 60.  Pneumococcal 13-valent conjugate (PCV13) vaccine. One dose is recommended after age 1.  Pneumococcal polysaccharide (PPSV23) vaccine. One dose is recommended after age 37. Talk to your health care provider about which screenings and vaccines you need and how often you need them. This information is not intended to replace advice given to you by your health care provider. Make sure you discuss any questions you have with your health care provider. Document Released: 08/11/2015 Document Revised: 04/03/2016 Document Reviewed: 05/16/2015 Elsevier Interactive Patient Education  2017 [place] Prevention in the Home Falls can cause injuries. They can happen to people of all ages. There are many things you can do to make your home safe and to help prevent falls. What can I do on the outside of my home?  Regularly fix the edges of walkways and driveways and fix any cracks.  Remove anything that might make you trip as you walk through a door, such as a raised step or threshold.  Trim any bushes or trees on the path to your home.  Use bright outdoor lighting.  Clear any walking paths of anything that might make someone trip, such as rocks or tools.  Regularly check to see if handrails are loose or broken. Make sure that both sides of any steps have handrails.  Any raised decks and porches should have guardrails on the edges.  Have any leaves, snow, or ice cleared regularly.  Use sand or salt on walking paths during winter.  Clean up any spills in your garage right away. This includes oil or grease spills. What can I do in the bathroom?  Use night lights.  Install grab bars by the toilet and in the tub and shower. Do not use towel bars as grab bars.  Use non-skid mats or decals in the tub or shower.  If you need to sit down in the shower, use a plastic, non-slip stool.  Keep the floor dry. Clean up any water that spills on the floor as soon as it  happens.  Remove soap buildup in the tub or shower regularly.  Attach bath mats securely with double-sided non-slip rug tape.  Do not have throw rugs and other things on the floor that can make you trip. What can I do in the bedroom?  Use night lights.  Make sure that you have a light by your bed that is easy to reach.  Do not use any sheets or blankets that are too big for your bed. They should not hang down onto the floor.  Have a firm chair that has side arms. You can use this for support while you get dressed.  Do not have throw rugs and other things on the floor that can make you trip. What can I do in the kitchen?  Clean up any spills right away.  Avoid walking on wet floors.  Keep items that you use a lot in easy-to-reach places.  If you need to reach something above you, use a strong step stool that has a grab bar.  Keep electrical cords out of the way.  Do not use floor polish or wax that makes floors slippery. If you must use wax, use non-skid floor wax.  Do not have throw  rugs and other things on the floor that can make you trip. What can I do with my stairs?  Do not leave any items on the stairs.  Make sure that there are handrails on both sides of the stairs and use them. Fix handrails that are broken or loose. Make sure that handrails are as long as the stairways.  Check any carpeting to make sure that it is firmly attached to the stairs. Fix any carpet that is loose or worn.  Avoid having throw rugs at the top or bottom of the stairs. If you do have throw rugs, attach them to the floor with carpet tape.  Make sure that you have a light switch at the top of the stairs and the bottom of the stairs. If you do not have them, ask someone to add them for you. What else can I do to help prevent falls?  Wear shoes that:  Do not have high heels.  Have rubber bottoms.  Are comfortable and fit you well.  Are closed at the toe. Do not wear sandals.  If you  use a stepladder:  Make sure that it is fully opened. Do not climb a closed stepladder.  Make sure that both sides of the stepladder are locked into place.  Ask someone to hold it for you, if possible.  Clearly mark and make sure that you can see:  Any grab bars or handrails.  First and last steps.  Where the edge of each step is.  Use tools that help you move around (mobility aids) if they are needed. These include:  Canes.  Walkers.  Scooters.  Crutches.  Turn on the lights when you go into a dark area. Replace any light bulbs as soon as they burn out.  Set up your furniture so you have a clear path. Avoid moving your furniture around.  If any of your floors are uneven, fix them.  If there are any pets around you, be aware of where they are.  Review your medicines with your doctor. Some medicines can make you feel dizzy. This can increase your chance of falling. Ask your doctor what other things that you can do to help prevent falls. This information is not intended to replace advice given to you by your health care provider. Make sure you discuss any questions you have with your health care provider. Document Released: 05/11/2009 Document Revised: 12/21/2015 Document Reviewed: 08/19/2014 Elsevier Interactive Patient Education  2017 [employer].

## 2020-02-11 NOTE — Progress Notes (Signed)
Virtual Visit via Telephone Note  I connected with  Charles Villa on 02/11/20 at 11:00 AM EDT by telephone and verified that I am speaking with the correct person using two identifiers.  Medicare Annual Wellness visit completed telephonically due to Covid-19 pandemic.   Persons participating in this call: This Health Coach and this patient.   Location: Patient: Home Provider: Office   I discussed the limitations, risks, security and privacy concerns of performing an evaluation and management service by telephone and the availability of in person appointments. The patient expressed understanding and agreed to proceed.  Unable to perform video visit due to video visit attempted and failed and/or patient does not have video capability.   Some vital signs may be absent or patient reported.   Willette Brace, LPN    Subjective:   Charles Villa is a 74 y.o. male who presents for Medicare Annual/Subsequent preventive examination.  Review of Systems     Cardiac Risk Factors include: hypertension;dyslipidemia;male gender     Objective:    There were no vitals filed for this visit. There is no height or weight on file to calculate BMI.  Advanced Directives 02/11/2020 02/04/2018 02/24/2017 02/03/2017 10/23/2015  Does Patient Have a Medical Advance Directive? Yes No Yes Yes Yes  Type of Paramedic of [place];Living will - [place];Living will [place];Living will -  Does patient want to make changes to medical advance directive? - - No - Patient declined No - Patient declined -  Copy of [place] in Chart? No - copy requested - No - copy requested No - copy requested -  Would patient like information on creating a medical advance directive? - No - Patient declined - - -    Current Medications (verified) Outpatient Encounter Medications as of 02/11/2020  Medication Sig  . aspirin EC 81 MG tablet Take  81 mg by mouth daily.  . clobetasol (TEMOVATE) 0.05 % external solution Apply 1 application topically at bedtime as needed (scalp).   . clobetasol cream (TEMOVATE) 8.67 % Apply 1 application topically 2 (two) times daily as needed (abdomen).   . clotrimazole-betamethasone (LOTRISONE) cream Apply 1 application topically 2 (two) times daily as needed. psoriass  . desonide (DESONATE) 0.05 % gel Apply 1 application topically 2 (two) times daily as needed. Skin lesions  . fluocinolone (VANOS) 0.01 % cream Apply 1 application topically daily as needed.   . hydrocortisone (ANUSOL-HC) 2.5 % rectal cream Place 1 application rectally 2 (two) times daily as needed for hemorrhoids or anal itching.  . hydrocortisone 2.5 % cream Apply 1 application topically 2 (two) times daily as needed (face).   [name] icosapent Ethyl (VASCEPA) 1 g capsule Take 2 capsules (2 g total) by mouth in the morning and at bedtime. NEED OV.  . pantoprazole (PROTONIX) 40 MG tablet TAKE 1 TABLET BY MOUTH EVERY DAY (Patient taking differently: Take 40 mg by mouth. 3 x a week)  . rosuvastatin (CRESTOR) 40 MG tablet TAKE 1 TABLET BY MOUTH EVERY DAY  . silver sulfADIAZINE (SILVADENE) 1 % cream Apply 1 application topically 2 (two) times daily as needed (groin).   . bisoprolol-hydrochlorothiazide (ZIAC) 2.5-6.25 MG tablet TAKE 1 TABLET BY MOUTH EVERY DAY  . [DISCONTINUED] Probiotic Product (PROBIOTIC DAILY PO) Take 1 tablet by mouth 3 (three) times a week. Tuesdays, Thursdays, and Saturdays (Patient not taking: Reported on 02/11/2020)   No facility-administered encounter medications on file as of 02/11/2020.  Allergies (verified) Patient has no known allergies.   History: Past Medical History:  Diagnosis Date  . Adenomatous colon polyp 11/02/2019  . Anal fissure 08/17/2012   Really more gluteal crease irritation- protosol hc 2.5% prn   . BACK PAIN, CHRONIC 02/10/2007  . CAD (coronary artery disease), native coronary artery     02/24/17-PCI/DES to mLcx, normal EF.  . Diverticulosis of colon    2007  . GERD 02/23/2007  . GERD (gastroesophageal reflux disease)   . Hyperlipidemia   . Hypertension   . OCD (obsessive compulsive disorder) 01/23/2016   Ocd/hoarding issues- psychiatristis- [place] started on zoloft 100mg --> paxil  . Personal history of colonic polyps 03/14/2014   08/2005. Benign. 10 year repeat. 2010 colonoscopy in record was entered erroneously.    [name] PSORIASIS 02/10/2007   Topical therapy.     . Scalp psoriasis   . SQUAMOUS CELL CARCINOMA OF SKIN SITE UNSPECIFIED 08/24/2010   Annotation: face    Past Surgical History:  Procedure Laterality Date  . COLONOSCOPY    . COLONOSCOPY W/ POLYPECTOMY     2007 [name]  . CORONARY STENT INTERVENTION N/A 02/24/2017   Procedure: Coronary Stent Intervention;  Surgeon: Leonie Man, MD;  Location: [facility];  Service: Cardiovascular;  Laterality: N/A;  . LEFT HEART CATH AND CORONARY ANGIOGRAPHY N/A 02/24/2017   Procedure: Left Heart Cath and Coronary Angiography;  Surgeon: Leonie Man, MD;  Location: [facility];  Service: Cardiovascular;  Laterality: N/A;  . POLYPECTOMY    . SKIN CANCER EXCISION     squamous cell CA - nose  . TONSILLECTOMY     Family History  Problem Relation Age of Onset  . Lung cancer Father   . Stroke Mother   . Hypertension Mother   . Colon polyps Neg Hx   . Colon cancer Neg Hx   . Esophageal cancer Neg Hx   . Rectal cancer Neg Hx   . Stomach cancer Neg Hx    Social History   Socioeconomic History  . Marital status: Married    Spouse name: Not on file  . Number of children: 2  . Years of education: Not on file  . Highest education level: Not on file  Occupational History  . Occupation: Education officer, community: UNEMPLOYED  . Occupation: retired  Tobacco Use  . Smoking status: Never Smoker  . Smokeless tobacco: Never Used  Vaping Use  . Vaping Use: Never used  Substance and Sexual  Activity  . Alcohol use: Yes    Alcohol/week: 21.0 standard drinks    Types: 21 Standard drinks or equivalent per week    Comment: 2-3 per day (gin)  . Drug use: No  . Sexual activity: Not on file  Other Topics Concern  . Not on file  Social History Narrative   Married 42 years in 2015. 2 kids, 1 grandchild that is 27.72 years old.       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       Hobbies: ride bike, ski in winter    Social Determinants of Health   Financial Resource Strain: Low Risk   . Difficulty of Paying Living Expenses: Not hard at all  Food Insecurity: No Food Insecurity  . Worried About Charity fundraiser in the Last Year: Never true  . Ran Out of Food in the Last Year: Never true  Transportation Needs: No Transportation Needs  .  Lack of Transportation (Medical): No  . Lack of Transportation (Non-Medical): No  Physical Activity: Insufficiently Active  . Days of Exercise per Week: 2 days  . Minutes of Exercise per Session: 30 min  Stress: No Stress Concern Present  . Feeling of Stress : Not at all  Social Connections: Moderately Integrated  . Frequency of Communication with Friends and Family: Once a week  . Frequency of Social Gatherings with Friends and Family: More than three times a week  . Attends Religious Services: More than 4 times per year  . Active Member of Clubs or Organizations: No  . Attends Archivist Meetings: Never  . Marital Status: Married    Tobacco Counseling Counseling given: Not Answered   Clinical Intake:  Pre-visit preparation completed: Yes  Pain : No/denies pain     BMI - recorded: 32.28 Nutritional Status: BMI > 30  Obese Diabetes: No  How often do you need to have someone help you when you read instructions, pamphlets, or other written materials from your doctor or pharmacy?: 1 - Never  Diabetic?No  Interpreter Needed?: No  Information entered by :: Charlott Rakes,  LPN   Activities of Daily Living In your present state of health, do you have any difficulty performing the following activities: 02/11/2020  Hearing? N  Vision? N  Difficulty concentrating or making decisions? N  Walking or climbing stairs? N  Dressing or bathing? N  Doing errands, shopping? N  Preparing Food and eating ? N  Using the Toilet? N  In the past six months, have you accidently leaked urine? N  Do you have problems with loss of bowel control? N  Managing your Medications? N  Managing your Finances? N  Housekeeping or managing your Housekeeping? N  Some recent data might be hidden    Patient Care Team: Marin Olp, MD as PCP - General (Family Medicine) Debara Pickett Nadean Corwin, MD as Consulting Physician (Cardiology) Carmela Rima, DMD as Consulting Physician (Dentistry)  Indicate any recent Medical Services you may have received from other than [facility] providers in the past year (date may be approximate).     Assessment:   This is a routine wellness examination for Charles Villa.  Hearing/Vision screen  Hearing Screening   125Hz  250Hz  500Hz  1000Hz  2000Hz  3000Hz  4000Hz  6000Hz  8000Hz   Right ear:           Left ear:           Comments: No difficulty hearing  Vision Screening Comments: Pt state he is due for eye exam with [facility] and wears eyeglasses  Dietary issues and exercise activities discussed: Current Exercise Habits: The patient does not participate in regular exercise at present  Goals    . Lose 20 lbs this year.     . Patient Stated     Lose 10- 15 lbs     . Weight (lb) < 200 lb (90.7 kg)     Eat lighter.       Depression Screen PHQ 2/9 Scores 02/11/2020 11/15/2019 05/06/2019 05/06/2019 02/09/2019 02/04/2018 06/16/2017  PHQ - 2 Score 0 0 0 0 0 0 0  PHQ- 9 Score - 2 1 - - 0 -    Fall Risk Fall Risk  02/11/2020 05/06/2019 02/09/2019 02/04/2018 03/18/2017  Falls in the past year? 0 0 0 Yes Yes  Number falls in past yr: 0 0 0 1 1  Injury with Fall? 0 0 0  Yes Yes  Comment - - - - -  Risk Factor Category  - - - High Fall Risk High Fall Risk  Risk for fall due to : Impaired vision;Impaired balance/gait - - - History of fall(s)  Follow up Falls prevention discussed - - Falls prevention discussed;Education provided Falls evaluation completed    Any stairs in or around the home? Yes  If so, are there any without handrails? No  Home free of loose throw rugs in walkways, pet beds, electrical cords, etc? Yes  Adequate lighting in your home to reduce risk of falls? Yes   ASSISTIVE DEVICES UTILIZED TO PREVENT FALLS:  Life alert? No  Use of a cane, walker or w/c? No  Grab bars in the bathroom? No  Shower chair or bench in shower? No  Elevated toilet seat or a handicapped toilet? No   TIMED UP AND GO:  Was the test performed? No   Cognitive Function:     6CIT Screen 02/11/2020  What Year? 0 points  What month? 0 points  What time? 0 points  Count back from 20 0 points  Months in reverse 0 points  Repeat phrase 0 points  Total Score 0    Immunizations Immunization History  Administered Date(s) Administered  . DTP 08/10/2007  . Fluad Quad(high Dose 65+) 05/06/2019  . Influenza Split 06/08/2012  . Influenza Whole 08/21/2009  . Influenza, High Dose Seasonal PF 05/12/2013, 05/06/2016, 05/05/2018  . Influenza, Seasonal, Injecte, Preservative Fre 05/23/2013  . Influenza-Unspecified 05/01/2015  . PFIZER SARS-COV-2 Vaccination 09/03/2019, 09/24/2019  . Pneumococcal Conjugate-13 08/30/2013  . Pneumococcal Polysaccharide-23 03/11/2014  . Td 07/29/1998, 08/15/2008  . Tdap 10/07/2016    TDAP status: Up to date Flu Vaccine status: Up to date Pneumococcal vaccine status: Up to date Covid-19 vaccine status: Completed vaccines  Qualifies for Shingles Vaccine? Yes   Zostavax completed No   Shingrix Completed?: No.    Education has been provided regarding the importance of this vaccine. Patient has been advised to call insurance company  to determine out of pocket expense if they have not yet received this vaccine. Advised may also receive vaccine at local pharmacy or Health Dept. Verbalized acceptance and understanding.  Screening Tests Health Maintenance  Topic Date Due  . INFLUENZA VACCINE  02/27/2020  . COLONOSCOPY  10/21/2022  . TETANUS/TDAP  10/08/2026  . COVID-19 Vaccine  Completed  . Hepatitis C Screening  Completed  . PNA vac Low Risk Adult  Completed    Health Maintenance  There are no preventive care reminders to display for this patient.  Colorectal cancer screening: Completed 10/21/19. Repeat every 3 years    Additional Screening:  Hepatitis C Screening: Completed 03/05/18  Vision Screening: Recommended annual ophthalmology exams for early detection of glaucoma and other disorders of the eye. Is the patient up to date with their annual eye exam?  Yes  Who is the provider or what is the name of the office in which the patient attends annual eye exams? [facility]   Dental Screening: Recommended annual dental exams for proper oral hygiene  Community Resource Referral / Chronic Care Management: CRR required this visit?  No   CCM required this visit?  No      Plan:     I have personally reviewed and noted the following in the patient's chart:   . Medical and social history . Use of alcohol, tobacco or illicit drugs  . Current medications and supplements . Functional ability and status . Nutritional status . Physical activity . Advanced directives .  List of other physicians . Hospitalizations, surgeries, and ER visits in previous 12 months . Vitals . Screenings to include cognitive, depression, and falls . Referrals and appointments  In addition, I have reviewed and discussed with patient certain preventive protocols, quality metrics, and best practice recommendations. A written personalized care plan for preventive services as well as general preventive health recommendations  were provided to patient.     Willette Brace, LPN   11/13/4079   Nurse Notes: None

## 2020-02-29 DIAGNOSIS — Z0100 Encounter for examination of eyes and vision without abnormal findings: Secondary | ICD-10-CM | POA: Diagnosis not present

## 2020-02-29 DIAGNOSIS — H524 Presbyopia: Secondary | ICD-10-CM | POA: Diagnosis not present

## 2020-03-06 ENCOUNTER — Telehealth: Payer: Self-pay | Admitting: Family Medicine

## 2020-03-06 NOTE — Progress Notes (Signed)
  Chronic Care Management   Note  03/06/2020 Name: Charles Villa MRN: [id number] DOB: May 02, 1946  [name] is a 74 y.o. year old male who is a primary care patient of Yong Channel, Brayton Mars, MD. I reached out to [name] by phone today in response to a referral sent by Mr. Ugo Thoma Larouche's PCP, Marin Olp, MD.   Mr. Beckford was given information about Chronic Care Management services today including:  1. CCM service includes personalized support from designated clinical staff supervised by his physician, including individualized plan of care and coordination with other care providers 2. 24/7 contact phone numbers for assistance for urgent and routine care needs. 3. Service will only be billed when office clinical staff spend 20 minutes or more in a month to coordinate care. 4. Only one practitioner may furnish and bill the service in a calendar month. 5. The patient may stop CCM services at any time (effective at the end of the month) by phone call to the office staff.   Patient agreed to services and verbal consent obtained.   Follow up plan:   Earney Hamburg Upstream Scheduler

## 2020-03-07 NOTE — Progress Notes (Signed)
  Chronic Care Management   Note  03/07/2020 Name: Charles Villa MRN: [id number] DOB: Jun 13, 1946  Charles Villa is a 74 y.o. year old male who is a primary care patient of Yong Channel, Brayton Mars, MD. I reached out to Charles Villa by phone today in response to a referral sent by Charles Villa's PCP, Marin Olp, MD.   Charles Villa was given information about Chronic Care Management services today including:  1. CCM service includes personalized support from designated clinical staff supervised by his physician, including individualized plan of care and coordination with other care providers 2. 24/7 contact phone numbers for assistance for urgent and routine care needs. 3. Service will only be billed when office clinical staff spend 20 minutes or more in a month to coordinate care. 4. Only one practitioner may furnish and bill the service in a calendar month. 5. The patient may stop CCM services at any time (effective at the end of the month) by phone call to the office staff.   Patient agreed to services and verbal consent obtained.   Follow up plan:   Charles Villa Upstream Scheduler

## 2020-03-29 ENCOUNTER — Encounter: Payer: Self-pay | Admitting: Family Medicine

## 2020-03-30 ENCOUNTER — Encounter: Payer: Self-pay | Admitting: Internal Medicine

## 2020-03-30 ENCOUNTER — Other Ambulatory Visit: Payer: Self-pay

## 2020-03-30 ENCOUNTER — Ambulatory Visit (INDEPENDENT_AMBULATORY_CARE_PROVIDER_SITE_OTHER): Payer: Medicare HMO | Admitting: Internal Medicine

## 2020-03-30 VITALS — BP 95/62 | HR 58 | Ht 71.0 in | Wt 225.4 lb

## 2020-03-30 DIAGNOSIS — I2510 Atherosclerotic heart disease of native coronary artery without angina pectoris: Secondary | ICD-10-CM | POA: Diagnosis not present

## 2020-03-30 DIAGNOSIS — E785 Hyperlipidemia, unspecified: Secondary | ICD-10-CM

## 2020-03-30 DIAGNOSIS — E781 Pure hyperglyceridemia: Secondary | ICD-10-CM | POA: Diagnosis not present

## 2020-03-30 DIAGNOSIS — Z9861 Coronary angioplasty status: Secondary | ICD-10-CM | POA: Diagnosis not present

## 2020-03-30 NOTE — Progress Notes (Signed)
OFFICE CONSULT NOTE  Chief Complaint:  Routine follow-up  Primary Care Physician: Marin Olp, MD  HPI:  Charles Villa is a 74 y.o. male who is being seen today for the evaluation of shortness of breath at the request of Marin Olp, MD. Mr. Genova has a history of hypertension, dyslipidemia, GERD, chronic back pain, OCD and long-standing alcohol use, according to his wife who did most the talking during the visit he drinks about 2-3 drinks per day however could be more. She says that's increased more recently. She notes that over the past 2 years she's had progressively worsening shortness of breath. 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 They have a vacation house at [facility] and he has approximate 65 steps to climb up. He says he gets short of breath walking up the stairs and has been more difficult to do that without stopping. He seems to minimize his symptoms. His wife is concerned about possible coronary artery disease as a cause of this. She used to work in cardiac rehabilitation, possibly is an Physiological scientist. There is a family history of hypertension and stroke in his mother and his father who was a smoker died of lung cancer. He denies any chest pain or pressure, palpitations, presyncope or syncopal symptoms. He also denies any fever, chills, cough or other infective symptoms.  02/20/2017  Charles Villa returns today for follow-up. He underwent echocardiogram as well as an exercise treadmill stress test. The exercise treadmill stress test unfortunately was abnormal indicating ST segment depression in leads 23 aVF and V5 and V6 beginning at 5 minutes a stress returning to baseline after about 5-9 minutes of recovery. His echocardiogram demonstrated normal systolic function, mild diastolic dysfunction and some mild valvular abnormalities. I reviewed the results for him today along with his wife and my recommendations for left  heart catheterization.  03/17/2017  Charles Villa was seen today in follow-up. He underwent a graded exercise tolerance test which was abnormal indicating ST segment changes concerning for ischemia. He was subsequently referred for cardiac catheterization. That was performed on 02/24/2017 by Dr. Ellyn Hack which indicated a 90% mid circumflex lesion. This was stented with a Promus Premier 3.5 x 16 mm drug-eluting stent. It was noted that there was moderate disease in the ostial and mid LAD.[name] LVEF was 55-65% with normal LV and diastolic pressure. After the procedure he reports an improvement in his breathing however he does not feel that he is back to "baseline". I explained him that there may be certainly other factors causing him to be short of breath including his significant weight gain and abdominal obesity. We also discussed the fact that he is on dual antiplatelet therapy and that continued alcohol use is not advisable. Fortunately he is agreed to cardiac rehabilitation. I feel that will help with both weight management and cardiac vascular conditioning.  09/09/2017  Charles Villa was seen today in follow-up.  He continues to do well.  He completed cardiac rehabilitation.  He denies any chest pain or worsening shortness of breath.  He does feel some fatigue and blood pressure was noted to be low today 94/62.  He said that is unusual as typically his blood pressures around 024 097 systolic.  EKG shows sinus rhythm with no ischemia today.  He is on dual antiplatelet therapy with aspirin and Plavix which we will plan to continue for 6 months.  03/25/2018  Charles Villa returns today for follow-up.  Overall he  reports feeling well and is generally asymptomatic.  He says he is able to walk up and down the hill to [place] at [facility] now without much difficulty.  He mentioned that he has been able to cut back on his alcohol somewhat but has not quit completely.  He is tolerating his heart  failure medicines.  Blood pressure is excellent today 110/70.  He is now a year out from stenting to the mid circumflex artery.  He continues on aspirin and Plavix.  09/22/2018  Charles Villa is seen today in follow-up.  Recently says that his alcohol intake is gone up a little more.  He is now about 3 drinks a day.  He denies any worsening shortness of breath.  His most recent lipid profile showed a total cholesterol 173, triglycerides 305, HDL 70 and LDL direct of 76.  This is on 40 of rosuvastatin.  We discussed recent trial data from REDUCE-IT study indicating that Vascepa (icosapent ethyl) showed significant cardiovascular risk reduction in patients elevated triglycerides persistently at goal LDL between 150 and 500.  I think he is an ideal candidate for this medication.  Of course alcohol is also likely contributing to his elevated triglycerides as well.  03/30/2020  Charles Villa Is seen today in follow-up.  Overall he feels well.  His blood pressure was a little low today but he says he is asymptomatic with that.  He has not been taking the Vascepa 4 g daily rather only 1 g daily because of the cost he wanted to spread out the medicine.  He also noted recently that his triglycerides were lower and not feel that he needed the full 4 g dose.  He and his wife recently sold their [place] and he has been less active.  He occasionally feels a little run down and wonders if this could be coronary disease or whether additional stress testing is necessary.  EKG shows no ischemic changes rather a sinus bradycardia at 58.  He reports continued alcohol use not worse or significantly less than previous.  PMHx:  Past Medical History:  Diagnosis Date  . Adenomatous colon polyp 11/02/2019  . Anal fissure 08/17/2012   Really more gluteal crease irritation- protosol hc 2.5% prn   . BACK PAIN, CHRONIC 02/10/2007  . CAD (coronary artery disease), native coronary artery    02/24/17-PCI/DES to mLcx, normal EF.  .  Diverticulosis of colon    2007  . GERD 02/23/2007  . GERD (gastroesophageal reflux disease)   . Hyperlipidemia   . Hypertension   . OCD (obsessive compulsive disorder) 01/23/2016   Ocd/hoarding issues- psychiatristis- [place] started on zoloft 100mg --> paxil  . Personal history of colonic polyps 03/14/2014   08/2005. Benign. 10 year repeat. 2010 colonoscopy in record was entered erroneously.    [name] PSORIASIS 02/10/2007   Topical therapy.     . Scalp psoriasis   . SQUAMOUS CELL CARCINOMA OF SKIN SITE UNSPECIFIED 08/24/2010   Annotation: face     Past Surgical History:  Procedure Laterality Date  . COLONOSCOPY    . COLONOSCOPY W/ POLYPECTOMY     2007 [name]  . CORONARY STENT INTERVENTION N/A 02/24/2017   Procedure: Coronary Stent Intervention;  Surgeon: Leonie Man, MD;  Location: [place] [facility];  Service: Cardiovascular;  Laterality: N/A;  . LEFT HEART CATH AND CORONARY ANGIOGRAPHY N/A 02/24/2017   Procedure: Left Heart Cath and Coronary Angiography;  Surgeon: Leonie Man, MD;  Location: [place]  [facility];  Service: Cardiovascular;  Laterality: N/A;  . POLYPECTOMY    . SKIN CANCER EXCISION     squamous cell CA - nose  . TONSILLECTOMY      FAMHx:  Family History  Problem Relation Age of Onset  . Lung cancer Father   . Stroke Mother   . Hypertension Mother   . Colon polyps Neg Hx   . Colon cancer Neg Hx   . Esophageal cancer Neg Hx   . Rectal cancer Neg Hx   . Stomach cancer Neg Hx     SOCHx:   reports that he has never smoked. He has never used smokeless tobacco. He reports current alcohol use of about 21.0 standard drinks of alcohol per week. He reports that he does not use drugs.  ALLERGIES:  No Known Allergies  ROS: Pertinent items noted in HPI and remainder of comprehensive ROS otherwise negative.  HOME MEDS: Current Outpatient Medications on File Prior to Visit  Medication Sig Dispense Refill  . aspirin EC 81 MG tablet Take 81 mg by mouth daily.     . bisoprolol-hydrochlorothiazide (ZIAC) 2.5-6.25 MG tablet TAKE 1 TABLET BY MOUTH EVERY DAY 90 tablet 3  . clobetasol (TEMOVATE) 0.05 % external solution Apply 1 application topically at bedtime as needed (scalp).   5  . clobetasol cream (TEMOVATE) 9.62 % Apply 1 application topically 2 (two) times daily as needed (abdomen).   3  . clotrimazole-betamethasone (LOTRISONE) cream Apply 1 application topically 2 (two) times daily as needed. psoriass    . desonide (DESONATE) 0.05 % gel Apply 1 application topically 2 (two) times daily as needed. Skin lesions    . fluocinolone (VANOS) 0.01 % cream Apply 1 application topically daily as needed.     . hydrocortisone (ANUSOL-HC) 2.5 % rectal cream Place 1 application rectally 2 (two) times daily as needed for hemorrhoids or anal itching.    . hydrocortisone 2.5 % cream Apply 1 application topically 2 (two) times daily as needed (face).   5  . icosapent Ethyl (VASCEPA) 1 g capsule Take 2 capsules (2 g total) by mouth in the morning and at bedtime. NEED OV. 360 capsule 0  . pantoprazole (PROTONIX) 40 MG tablet TAKE 1 TABLET BY MOUTH EVERY DAY (Patient taking differently: Take 40 mg by mouth. 3 x a week) 90 tablet 3  . rosuvastatin (CRESTOR) 40 MG tablet TAKE 1 TABLET BY MOUTH EVERY DAY 90 tablet 1  . silver sulfADIAZINE (SILVADENE) 1 % cream Apply 1 application topically 2 (two) times daily as needed (groin).   5   No current facility-administered medications on file prior to visit.    LABS/IMAGING: No results found for this or any previous visit (from the past 48 hour(s)). No results found.  LIPID PANEL:    Component Value Date/Time   CHOL 159 02/05/2019 1008   TRIG 130.0 02/05/2019 1008   TRIG 89 08/11/2006 0818   HDL 62.60 02/05/2019 1008   CHOLHDL 3 02/05/2019 1008   VLDL 26.0 02/05/2019 1008   LDLCALC 70 02/05/2019 1008   LDLDIRECT 55.0 11/15/2019 1032    WEIGHTS: Wt Readings from Last 3 Encounters:  03/30/20 225 lb 6.4 oz (102.2 kg)   11/15/19 225 lb 3.2 oz (102.2 kg)  10/21/19 235 lb (106.6 kg)    VITALS: BP 95/62   Pulse (!) 58   Ht 5\' 11"  (1.803 m)   Wt 225 lb 6.4 oz (102.2 kg)   SpO2 95%   BMI 31.44 kg/m  EXAM: General appearance: alert and no distress Neck: no carotid bruit, no JVD and thyroid not enlarged, symmetric, no tenderness/mass/nodules Lungs: clear to auscultation bilaterally Heart: regular rate and rhythm, S1, S2 normal, no murmur, click, rub or gallop Abdomen: soft, non-tender; bowel sounds normal; no masses,  no organomegaly and obese Extremities: extremities normal, atraumatic, no cyanosis or edema Pulses: 2+ and symmetric Skin: Skin color, texture, turgor normal. No rashes or lesions Neurologic: Grossly normal Psych: Pleasant  EKG: Sinus bradycardia 58, RSR in V1-personally reviewed  ASSESSMENT: 1. Coronary artery disease status post PCI to the mid circumflex-Promus Premier 3.5 by 51mm DES (01/2017) 2. Alcohol abuse 3. Hypertension 4. Dyslipidemia 5. Obesity/Metabolic syndrome  PLAN: 1.   Mr. Doolittle has been taking a quarter of the recommended Vascepa dose due to cost issues.  I provided information about the health well grant and if approved would advise him to take 2 g twice daily which is the recommended dose.  We will plan repeat lipids in 3 to 4 months.  I again advised him to try to cut down on alcohol use.  Follow-up with me annually or sooner as necessary.  Pixie Casino, MD, [facility] [place] Director of the Advanced Lipid Disorders &  Cardiovascular Risk Reduction Clinic Diplomate of the American Board of Clinical Lipidology Attending Cardiologist  Direct Dial: [phone]  Fax: [phone]  Website:  [email or web address] Brookes Craine 03/30/2020, 3:06 PM

## 2020-03-30 NOTE — Patient Instructions (Addendum)
Medication Instructions:  Your physician has recommended you make the following change in your medication:  1.  RESTART Vascepa 1 g taking 2 tablets twice a day   *If you need a refill on your cardiac medications before your next appointment, please call your pharmacy*   Lab Work: 3 MONTHS:  COME TO THE OFFICE FOR FASTING LIPID    If you have labs (blood work) drawn today and your tests are completely normal, you will receive your results only by: [name] MyChart Message (if you have MyChart) OR . A paper copy in the mail If you have any lab test that is abnormal or we need to change your treatment, we will call you to review the results.   Testing/Procedures: None ordered   Follow-Up: At [facility], you and your health needs are our priority.  As part of our continuing mission to provide you with exceptional heart care, we have created designated Provider Care Teams.  These Care Teams include your primary Cardiologist (physician) and Advanced Practice Providers (APPs -  Physician Assistants and Nurse Practitioners) who all work together to provide you with the care you need, when you need it.  We recommend signing up for the patient portal called "MyChart".  Sign up information is provided on this After Visit Summary.  MyChart is used to connect with patients for Virtual Visits (Telemedicine).  Patients are able to view lab/test results, encounter notes, upcoming appointments, etc.  Non-urgent messages can be sent to your provider as well.   To learn more about what you can do with MyChart, go to [email or web address].    Your next appointment:   12 month(s)  The format for your next appointment:   In Person  Provider:   Raliegh Ip Mali [name], MD   Other Instructions The website for the health well is:  [email or web address]  Then go to disease funds then to hypercholesterolemia

## 2020-04-05 DIAGNOSIS — R69 Illness, unspecified: Secondary | ICD-10-CM | POA: Diagnosis not present

## 2020-04-19 ENCOUNTER — Other Ambulatory Visit: Payer: Self-pay

## 2020-04-19 ENCOUNTER — Encounter: Payer: Self-pay | Admitting: Family Medicine

## 2020-04-19 ENCOUNTER — Ambulatory Visit: Payer: Medicare HMO | Admitting: Family Medicine

## 2020-04-19 ENCOUNTER — Ambulatory Visit (INDEPENDENT_AMBULATORY_CARE_PROVIDER_SITE_OTHER): Payer: Medicare HMO

## 2020-04-19 VITALS — BP 102/70 | HR 54 | Ht 71.0 in | Wt 228.2 lb

## 2020-04-19 DIAGNOSIS — M79641 Pain in right hand: Secondary | ICD-10-CM

## 2020-04-19 DIAGNOSIS — M1811 Unilateral primary osteoarthritis of first carpometacarpal joint, right hand: Secondary | ICD-10-CM | POA: Diagnosis not present

## 2020-04-19 NOTE — Progress Notes (Signed)
    Subjective:    CC: R thumb pain  I, Molly Weber, LAT, ATC, am serving as scribe for Dr. Lynne Leader.  HPI: PT is a 74 y/o male presenting w/ R thumb pain x 2 weeks.  He locates his pain to the base of his R thumb. At this point, the pain has resolved.  He does have a history of psoriasis and is concerned about the possibility of psoriatic arthritis.  Radiating pain: No R thumb swelling: No Aggravating factors: R thumb ext and aBd; gripping Treatments tried: IBU; Tylenol; Voltaren  Pertinent review of Systems: No fevers or chills  Relevant historical information: Neuropathy secondary to alcohol abuse   Objective:    Vitals:   04/19/20 1526  BP: 102/70  Pulse: (!) 54  SpO2: 95%   General: Well Developed, well nourished, and in no acute distress.   MSK: Right thumb some bossing at [facility] and MCP otherwise normal-appearing hand and thumb. Normal wrist and hand motion. Strength intact. Not particularly tender. Pulses cap refill and sensation are intact distally.  Lab and Radiology Results X-ray images right hand obtained today personally and independently reviewed Significant DJD first [place].  No acute fractures. Calcification present radial artery Await formal radiology review   Impression and Recommendations:    Assessment and Plan: 74 y.o. male with right hand/wrist pain most prominent at base of thumb.  Secondary to DJD very likely.  Doubtful rheumatologic process however history of psoriasis does increase risk of psoriatic arthritis.  If not improved in near future or if returning would consider rheumatologic work-up along with hand PT and or injection.  Recheck back as needed.[name]  PDMP not reviewed this encounter. Orders Placed This Encounter  Procedures  . DG Hand Complete Right    Standing Status:   Future    Number of Occurrences:   1    Standing Expiration Date:   04/19/2021    Order Specific Question:   Reason for Exam (SYMPTOM  OR DIAGNOSIS REQUIRED)     Answer:   eval pain base of thumb    Order Specific Question:   Preferred imaging location?    Answer:   [facility]    Order Specific Question:   Radiology Contrast Protocol - do NOT remove file path    Answer:   [email or web address]   No orders of the defined types were placed in this encounter.   Discussed warning signs or symptoms. Please see discharge instructions. Patient expresses understanding.   The above documentation has been reviewed and is accurate and complete Lynne Leader, M.D.

## 2020-04-19 NOTE — Progress Notes (Deleted)
    Subjective:    CC: R wrist pain  I, Kannen Moxey, LAT, ATC, am serving as scribe for Dr. Lynne Leader.  HPI: Pt is a 74 y/o male presenting w/ c/o R wrist pain .  He locates his pain to .  Radiating pain: R wrist swelling: R wrist mechanical symptoms: Aggravating factors: Treatments tried:  Pertinent review of Systems: ***  Relevant historical information: ***   Objective:   There were no vitals filed for this visit. General: Well Developed, well nourished, and in no acute distress.   MSK: ***  Lab and Radiology Results No results found for this or any previous visit (from the past 72 hour(s)). No results found.    Impression and Recommendations:    Assessment and Plan: 74 y.o. male with ***.  PDMP not reviewed this encounter. No orders of the defined types were placed in this encounter.  No orders of the defined types were placed in this encounter.   Discussed warning signs or symptoms. Please see discharge instructions. Patient expresses understanding.   ***

## 2020-04-19 NOTE — Patient Instructions (Signed)
Thank you for coming in today.  Please get an Xray today before you leave  Please use voltaren gel up to 4x daily for pain as needed.   If this comes back we can do more including labs, hand PT, and injection.  Keep me updated.

## 2020-04-21 NOTE — Progress Notes (Signed)
X-ray right hand shows arthritis at the base of the thumb

## 2020-04-27 ENCOUNTER — Telehealth: Payer: Self-pay

## 2020-04-27 NOTE — Progress Notes (Signed)
Chronic Care Management Pharmacy Assistant   Name: Charles Villa  MRN: [id number] DOB: April 30, 1946  Reason for Encounter:  Medication Review/Initial Questions for Pharmacist visit on 05-01-20 at 9:00am.   Patient Questions:  1.  Have you seen any other providers since your last visit? Yes, Patient states he seen sports medicine for cramping in Right hand. Patient states hes doing better.  2.  Any changes in your medicines or health? No   Charles Villa,  74 y.o. , male presents for their Initial CCM visit with the clinical pharmacist via telephone.   Have you seen any other providers since your last visit? yes, patient states he seen [place] sports medicine for cramping in right hand. Patient states ,they took X-rays,  everything came back normal and patient states he is doing better. Any changes in your medications or health? no Any side effects from any medications? no Do you have an symptoms or problems not managed by your medications? no Any concerns about your health right now? Yes , Patient states he is  having recurrent shortness of breath , Patient states he is not feeling like himself lately and feels that it is from getting older. Has your provider asked that you check blood pressure, blood sugar, or follow special diet at home? Yes, Patient states he watch what he eats. Do you get any type of exercise on a regular basis? Yes , Patient states he does yard work. Can you think of a goal you would like to reach for your health? Patient states he would like to Lose weight and shed about  10-15 pounds. Do you have any problems getting your medications? no Is there anything that you would like to discuss during the appointment? No  Please bring medications and supplements to appointment   PCP : Marin Olp, MD  Allergies:  No Known Allergies  Medications: Outpatient Encounter Medications as of 04/27/2020  Medication Sig Note  . aspirin EC 81 MG tablet Take 81 mg by  mouth daily.   . bisoprolol-hydrochlorothiazide (ZIAC) 2.5-6.25 MG tablet TAKE 1 TABLET BY MOUTH EVERY DAY   . clobetasol (TEMOVATE) 0.05 % external solution Apply 1 application topically at bedtime as needed (scalp).    . clobetasol cream (TEMOVATE) 6.31 % Apply 1 application topically 2 (two) times daily as needed (abdomen).    . clotrimazole-betamethasone (LOTRISONE) cream Apply 1 application topically 2 (two) times daily as needed. psoriass   . desonide (DESONATE) 0.05 % gel Apply 1 application topically 2 (two) times daily as needed. Skin lesions   . fluocinolone (VANOS) 0.01 % cream Apply 1 application topically daily as needed.    . hydrocortisone (ANUSOL-HC) 2.5 % rectal cream Place 1 application rectally 2 (two) times daily as needed for hemorrhoids or anal itching.   . hydrocortisone 2.5 % cream Apply 1 application topically 2 (two) times daily as needed (face).    [name] icosapent Ethyl (VASCEPA) 1 g capsule Take 2 capsules (2 g total) by mouth in the morning and at bedtime. NEED OV. 02/11/2020: Pt taking one a day  . pantoprazole (PROTONIX) 40 MG tablet TAKE 1 TABLET BY MOUTH EVERY DAY (Patient taking differently: Take 40 mg by mouth. 3 x a week)   . rosuvastatin (CRESTOR) 40 MG tablet TAKE 1 TABLET BY MOUTH EVERY DAY   . silver sulfADIAZINE (SILVADENE) 1 % cream Apply 1 application topically 2 (two) times daily as needed (groin).     No facility-administered encounter medications on  file as of 04/27/2020.    Current Diagnosis: Patient Active Problem List   Diagnosis Date Noted  . History of adenomatous polyp of colon 11/02/2019  . Senile purpura ([place]) 05/06/2019  . Alcoholic peripheral neuropathy ([place]) 02/09/2019  . Thiamine deficiency 04/28/2017  . CAD S/P percutaneous coronary angioplasty 02/24/2017  . S/P cardiac catheterization 02/24/2017  . Abnormal cardiovascular stress test 02/20/2017  . Alcoholism in recovery ([place]) 02/03/2017  . Tremor 05/06/2016  . OCD (obsessive  compulsive disorder) 01/23/2016  . Essential hypertension 03/11/2014  . Anal fissure 08/17/2012  . SQUAMOUS CELL CARCINOMA OF SKIN SITE UNSPECIFIED 08/24/2010  . GERD 02/23/2007  . Hyperlipidemia with target low density lipoprotein (LDL) cholesterol less than 70 mg/dL 02/10/2007  . PSORIASIS 02/10/2007  . BACK PAIN, CHRONIC 02/10/2007     Georgiana Shore ,[place] Clinical Pharmacist Assistant [phone]   Follow-Up:  Pharmacist Review

## 2020-04-28 NOTE — Progress Notes (Signed)
Chronic Care Management Pharmacy  Name: Charles Villa  MRN: [id number] DOB: 03-30-1946  Chief Complaint/ HPI  Charles Villa,  74 y.o., male presents for their Initial CCM visit with the clinical pharmacist via telephone due to COVID-19 Pandemic.  PCP : Marin Olp, MD  Chronic conditions include:  Encounter Diagnoses  Name Primary?  . Hyperlipidemia with target low density lipoprotein (LDL) cholesterol less than 70 mg/dL Yes  . Essential hypertension   . Gastroesophageal reflux disease, unspecified whether esophagitis present      Patient Active Problem List   Diagnosis Date Noted  . History of adenomatous polyp of colon 11/02/2019  . Senile purpura ([place]) 05/06/2019  . Alcoholic peripheral neuropathy ([place]) 02/09/2019  . Thiamine deficiency 04/28/2017  . CAD S/P percutaneous coronary angioplasty 02/24/2017  . S/P cardiac catheterization 02/24/2017  . Abnormal cardiovascular stress test 02/20/2017  . Alcoholism in recovery ([place]) 02/03/2017  . Tremor 05/06/2016  . OCD (obsessive compulsive disorder) 01/23/2016  . Essential hypertension 03/11/2014  . Anal fissure 08/17/2012  . SQUAMOUS CELL CARCINOMA OF SKIN SITE UNSPECIFIED 08/24/2010  . GERD 02/23/2007  . Hyperlipidemia with target low density lipoprotein (LDL) cholesterol less than 70 mg/dL 02/10/2007  . PSORIASIS 02/10/2007  . BACK PAIN, CHRONIC 02/10/2007   Past Surgical History:  Procedure Laterality Date  . COLONOSCOPY    . COLONOSCOPY W/ POLYPECTOMY     2007 [name]  . CORONARY STENT INTERVENTION N/A 02/24/2017   Procedure: Coronary Stent Intervention;  Surgeon: Leonie Man, MD;  Location: [place] [facility];  Service: Cardiovascular;  Laterality: N/A;  . LEFT HEART CATH AND CORONARY ANGIOGRAPHY N/A 02/24/2017   Procedure: Left Heart Cath and Coronary Angiography;  Surgeon: Leonie Man, MD;  Location: [place] [facility];  Service: Cardiovascular;  Laterality: N/A;  . POLYPECTOMY    . SKIN  CANCER EXCISION     squamous cell CA - nose  . TONSILLECTOMY     Family History  Problem Relation Age of Onset  . Lung cancer Father   . Stroke Mother   . Hypertension Mother   . Colon polyps Neg Hx   . Colon cancer Neg Hx   . Esophageal cancer Neg Hx   . Rectal cancer Neg Hx   . Stomach cancer Neg Hx    No Known Allergies Outpatient Encounter Medications as of 05/01/2020  Medication Sig Note  . aspirin EC 81 MG tablet Take 81 mg by mouth daily.   . bisoprolol-hydrochlorothiazide (ZIAC) 2.5-6.25 MG tablet TAKE 1 TABLET BY MOUTH EVERY DAY   . clobetasol (TEMOVATE) 0.05 % external solution Apply 1 application topically at bedtime as needed (scalp).    . clobetasol cream (TEMOVATE) 5.05 % Apply 1 application topically 2 (two) times daily as needed (abdomen).    . clotrimazole-betamethasone (LOTRISONE) cream Apply 1 application topically 2 (two) times daily as needed. psoriass   . fluocinolone (VANOS) 0.01 % cream Apply 1 application topically daily as needed.    . hydrocortisone (ANUSOL-HC) 2.5 % rectal cream Place 1 application rectally 2 (two) times daily as needed for hemorrhoids or anal itching.   . hydrocortisone 2.5 % cream Apply 1 application topically 2 (two) times daily as needed (face).    [name] icosapent Ethyl (VASCEPA) 1 g capsule Take 2 capsules (2 g total) by mouth in the morning and at bedtime. NEED OV. 02/11/2020: Pt taking one a day  . pantoprazole (PROTONIX) 40 MG tablet TAKE 1 TABLET BY MOUTH EVERY  DAY (Patient taking differently: Take 40 mg by mouth. 3 x a week)   . rosuvastatin (CRESTOR) 40 MG tablet TAKE 1 TABLET BY MOUTH EVERY DAY   . silver sulfADIAZINE (SILVADENE) 1 % cream Apply 1 application topically 2 (two) times daily as needed (groin).    [name] desonide (DESONATE) 0.05 % gel Apply 1 application topically 2 (two) times daily as needed. Skin lesions    No facility-administered encounter medications on file as of 05/01/2020.   Patient Care Team    Relationship  Specialty Notifications Start End  Marin Olp, MD PCP - General Family Medicine  03/11/14   Pixie Casino, MD Consulting Physician Cardiology  02/04/18   Carmela Rima, DMD Consulting Physician Dentistry  02/04/18   Madelin Rear, [facility] Pharmacist Pharmacist  03/06/20    Comment: [phone]   Current Diagnosis/Assessment: Goals Addressed            This Visit's Progress   . PharmD Care Plan       CARE PLAN ENTRY (see longitudinal plan of care for additional care plan information)  Current Barriers:  . Chronic Disease Management support, education, and care coordination needs related to Hypertension, Hyperlipidemia, and GERD   Hypertension BP Readings from Last 3 Encounters:  04/19/20 102/70  03/30/20 95/62  11/15/19 102/64   . Pharmacist Clinical Goal(s): o Over the next 180 days, patient will work with PharmD and providers to maintain BP goal <130/80 . Current regimen:  o Bisoprolol-hydrochlorothiazide 2.5-6.25 mg once daily  . Interventions: o Diet/exercise recommendations . Patient self care activities - Over the next 180 days, patient will: o Utilize fitness benefit through [email or web address] and start exercising at the local [employer] 2-3x/week o Check BP as directed, document, and provide at future appointments o Ensure daily salt intake < 2300 mg/day  Hyperlipidemia Lab Results  Component Value Date/Time   LDLCALC 70 02/05/2019 10:08 AM   LDLDIRECT 55.0 11/15/2019 10:32 AM   . Pharmacist Clinical Goal(s): o Over the next 180 days, patient will work with PharmD and providers to maintain LDL goal < 70 . Current regimen:  o Rosuvastatin 40 mg once daily o Vascepa - 2 grams every morning, 1 gram every evening . Interventions: o Diet/exercise recommendations - see BP section o Explore eligibility for healthwell high cholesterol grant - apply as appropriate  . Patient self care activities - Over the next 180 days, patient will: o Continue current  management  GERD . Pharmacist Clinical Goal(s) o Over the next 180 days, patient will work with PharmD and providers to minimize acid reflux symptoms . Current regimen:  o Pantoprazole 40 mg once daily . Interventions: o Discussed potential acid reflux triggers - restrict daily use of orange juice as possible . Patient self care activities - Over the next 180 days, patient will: o Continue to be mindful of acid-reflux triggers, avoid where possible  Medication management . Pharmacist Clinical Goal(s): o Over the next 180 days, patient will work with PharmD and providers to achieve optimal medication adherence . [facility] . Interventions o Comprehensive medication review performed. o Continue current medication management strategy . Patient self care activities - Over the next 180 days, patient will: o Take medications as prescribed o Report any questions or concerns to PharmD and/or provider(s)  Initial goal documentation       Hypertension   BP goal <130/80  BP Readings from Last 3 Encounters:  04/19/20 102/70  03/30/20 95/62  11/15/19 102/64  Patient has failed these meds in the past: n/a Patient checks BP at home infrequently Patient home BP readings are ranging: n/a.  Denies dizziness. Patient is currently controlled on the following medications:  . Bisoprolol-hydrochlorothiazide 2.5-6.25 mg once daily   We discussed diet and exercise extensively. Mindful of diet/foods consumed, no specific plan. We reviewed fitness benefit through [place], expresses interested utilizing silver sneakers for local [employer].  Plan  Continue current medications and control with diet and exercise.   Hyperlipidemia   LDL goal < 70   Lipid Panel     Component Value Date/Time   CHOL 159 02/05/2019 1008   TRIG 130.0 02/05/2019 1008   TRIG 89 08/11/2006 0818   HDL 62.60 02/05/2019 1008   LDLCALC 70 02/05/2019 1008   LDLDIRECT 55.0 11/15/2019 1032    Hepatic  Function Latest Ref Rng & Units 11/15/2019 05/06/2019 02/05/2019  Total Protein 6.0 - 8.3 g/dL 6.7 7.0 6.6  Albumin 3.5 - 5.2 g/dL 4.0 4.1 3.9  AST 0 - 37 U/L 36 30 32  ALT 0 - 53 U/L '28 27 30  ' Alk Phosphatase 39 - 117 U/L 40 42 43  Total Bilirubin 0.2 - 1.2 mg/dL 0.9 0.7 0.5  Bilirubin, Direct 0.0 - 0.3 mg/dL - - -    The 10-year ASCVD risk score Mikey Bussing DC Jr., et al., 2013) is: 13.7%   Values used to calculate the score:     Age: 56 years     Sex: Male     Is Non-Hispanic African American: No     Diabetic: No     Tobacco smoker: No     Systolic Blood Pressure: 315 mmHg     Is BP treated: Yes     HDL Cholesterol: 62.6 mg/dL     Total Cholesterol: 159 mg/dL   Patient has failed these meds in past: n/a Patient is currently at goal on the following medications:  . Rosuvastatin 40 mg once daily  . Vascepa 1 gram capsule - taking 2 gram in AM, 1 gram in PM daily  We discussed:  diet and exercise extensively - see HTN. Patient expresses some concern with Vascepa cost, would like to apply for [employer] as available.   Plan  Healthwell grant for high cholersterol, apply/review as available. Continue current medications and control with diet and exercise.  GERD   Patient denies recent acid reflux. Symptoms have not been an issue but dentist has concern with acid eroding teeth. Drinks orange juice daily. Currently controlled on: . Pantoprazole 40 mg tablet - one tablet three times a week  We discussed: Avoidance of potential triggers such as citrus juices, fatty foods and tomato sauce.  Plan   Continue current medications.  Vaccines   Immunization History  Administered Date(s) Administered  . DTP 08/10/2007  . Fluad Quad(high Dose 65+) 05/06/2019  . Influenza Split 06/08/2012  . Influenza Whole 08/21/2009  . Influenza, High Dose Seasonal PF 05/12/2013, 05/06/2016, 05/05/2018  . Influenza, Seasonal, Injecte, Preservative Fre 05/23/2013  .  Influenza-Unspecified 05/01/2015  . PFIZER SARS-COV-2 Vaccination 09/03/2019, 09/24/2019  . Pneumococcal Conjugate-13 08/30/2013  . Pneumococcal Polysaccharide-23 03/11/2014  . Td 07/29/1998, 08/15/2008  . Tdap 10/07/2016   Reports receiving Pfizer covid vaccine booster last week 9/29. Reviewed and discussed patient's vaccination history.  Due for annual flu vaccine, shingrix.  Plan  Recommended patient receive annual flu, shingrix vaccines.   Medication Management / Care Coordination   Receives prescription medications from:  [id number] [place] [facility]  [address] [place] [address] Phone: [phone] Fax: [address], [address] [place] [address] Phone: [phone] Fax: [phone]   Payor: [place] MEDICARE / Plan: [employer] MEDICARE HMO/PPO / Product Type: *No Product type* /   Expresses some interest in coordinated pharmacy services, wanting to hold off at this time.   Plan  Utilize [facility] pharmacy for medication synchronization, packaging and delivery. ___________________________ SDOH (Social Determinants of Health) assessments performed: Yes.  Future Appointments  Date Time Provider [place]  05/18/2020 10:40 AM Marin Olp, MD [facility]  10/31/2020  1:00 PM [facility] CCM PHARMACIST [facility]   Visit follow-up:  . CPA: 3 month BP. . RPH: 6 month telephone visit. HLD, HTN, healthwell lipid grant.   Madelin Rear, Pharm.D., BCGP Clinical Pharmacist [facility] [phone]

## 2020-05-01 ENCOUNTER — Ambulatory Visit: Payer: Medicare HMO

## 2020-05-01 DIAGNOSIS — I10 Essential (primary) hypertension: Secondary | ICD-10-CM

## 2020-05-01 DIAGNOSIS — K219 Gastro-esophageal reflux disease without esophagitis: Secondary | ICD-10-CM

## 2020-05-01 DIAGNOSIS — E785 Hyperlipidemia, unspecified: Secondary | ICD-10-CM

## 2020-05-01 NOTE — Patient Instructions (Addendum)
Please review care plan below and call me at [phone] (direct line) with any questions!  Goal to start exercising at [facility] 2-3x/wk - Plan fitness benefit can be explored at [email or web address]  We will review your eligibility for healthwell foundation grant as it relates to additional cost support on high cholesterol medications. If eligible, we will apply once we notice the grant is open.   Thank you, Edyth Gunnels., Clinical Pharmacist  Goals Addressed            This Visit's Progress   . PharmD Care Plan       CARE PLAN ENTRY (see longitudinal plan of care for additional care plan information)  Current Barriers:  . Chronic Disease Management support, education, and care coordination needs related to Hypertension, Hyperlipidemia, and GERD   Hypertension BP Readings from Last 3 Encounters:  04/19/20 102/70  03/30/20 95/62  11/15/19 102/64   . Pharmacist Clinical Goal(s): o Over the next 180 days, patient will work with PharmD and providers to maintain BP goal <130/80 . Current regimen:  o Bisoprolol-hydrochlorothiazide 2.5-6.25 mg once daily  . Interventions: o Diet/exercise recommendations . Patient self care activities - Over the next 180 days, patient will: o Utilize fitness benefit through [email or web address] and start exercising at the local [employer] 2-3x/week o Check BP as directed, document, and provide at future appointments o Ensure daily salt intake < 2300 mg/day  Hyperlipidemia Lab Results  Component Value Date/Time   LDLCALC 70 02/05/2019 10:08 AM   LDLDIRECT 55.0 11/15/2019 10:32 AM   . Pharmacist Clinical Goal(s): o Over the next 180 days, patient will work with PharmD and providers to maintain LDL goal < 70 . Current regimen:  o Rosuvastatin 40 mg once daily o Vascepa - 2 grams every morning, 1 gram every evening . Interventions: o Diet/exercise recommendations - see BP section o Explore eligibility for healthwell high cholesterol grant - apply as  appropriate  . Patient self care activities - Over the next 180 days, patient will: o Continue current management  GERD . Pharmacist Clinical Goal(s) o Over the next 180 days, patient will work with PharmD and providers to minimize acid reflux symptoms . Current regimen:  o Pantoprazole 40 mg once daily . Interventions: o Discussed potential acid reflux triggers - restrict daily use of orange juice as possible . Patient self care activities - Over the next 180 days, patient will: o Continue to be mindful of acid-reflux triggers, avoid where possible  Medication management . Pharmacist Clinical Goal(s): o Over the next 180 days, patient will work with PharmD and providers to achieve optimal medication adherence . [facility] . Interventions o Comprehensive medication review performed. o Continue current medication management strategy . Patient self care activities - Over the next 180 days, patient will: o Take medications as prescribed o Report any questions or concerns to PharmD and/or provider(s)  Initial goal documentation       Mr. Yandow was given information about Chronic Care Management services today including:  1. CCM service includes personalized support from designated clinical staff supervised by his physician, including individualized plan of care and coordination with other care providers 2. 24/7 contact phone numbers for assistance for urgent and routine care needs. 3. Standard insurance, coinsurance, copays and deductibles apply for chronic care management only during months in which we provide at least 20 minutes of these services. Most insurances cover these services at 100%, however patients may be responsible for any copay, coinsurance and/or deductible if applicable.  This service may help you avoid the need for more expensive face-to-face services. 4. Only one practitioner may furnish and bill the service in a calendar month. 5. The patient  may stop CCM services at any time (effective at the end of the month) by phone call to the office staff.  Patient agreed to services and verbal consent obtained.   The patient verbalized understanding of instructions provided today and agreed to receive a mailed copy of patient instruction and/or educational materials. Telephone follow up appointment with pharmacy team member scheduled for: See next appointment with "Care Management Staff" under "What's Next" below.   Madelin Rear, Pharm.D., BCGP Clinical Pharmacist [facility] [phone]  Hypertension, Adult High blood pressure (hypertension) is when the force of blood pumping through the arteries is too strong. The arteries are the blood vessels that carry blood from the heart throughout the body. Hypertension forces the heart to work harder to pump blood and may cause arteries to become narrow or stiff. Untreated or uncontrolled hypertension can cause a heart attack, heart failure, a stroke, kidney disease, and other problems. A blood pressure reading consists of a higher number over a lower number. Ideally, your blood pressure should be below 120/80. The first ("top") number is called the systolic pressure. It is a measure of the pressure in your arteries as your heart beats. The second ("bottom") number is called the diastolic pressure. It is a measure of the pressure in your arteries as the heart relaxes. What are the causes? The exact cause of this condition is not known. There are some conditions that result in or are related to high blood pressure. What increases the risk? Some risk factors for high blood pressure are under your control. The following factors may make you more likely to develop this condition:  Smoking.  Having type 2 diabetes mellitus, high cholesterol, or both.  Not getting enough exercise or physical activity.  Being overweight.  Having too much fat, sugar, calories, or salt (sodium) in your  diet.  Drinking too much alcohol. Some risk factors for high blood pressure may be difficult or impossible to change. Some of these factors include:  Having chronic kidney disease.  Having a family history of high blood pressure.  Age. Risk increases with age.  Race. You may be at higher risk if you are African American.  Gender. Men are at higher risk than women before age 60. After age 63, women are at higher risk than men.  Having obstructive sleep apnea.  Stress. What are the signs or symptoms? High blood pressure may not cause symptoms. Very high blood pressure (hypertensive crisis) may cause:  Headache.  Anxiety.  Shortness of breath.  Nosebleed.  Nausea and vomiting.  Vision changes.  Severe chest pain.  Seizures. How is this diagnosed? This condition is diagnosed by measuring your blood pressure while you are seated, with your arm resting on a flat surface, your legs uncrossed, and your feet flat on the floor. The cuff of the blood pressure monitor will be placed directly against the skin of your upper arm at the level of your heart. It should be measured at least twice using the same arm. Certain conditions can cause a difference in blood pressure between your right and left arms. Certain factors can cause blood pressure readings to be lower or higher than normal for a short period of time:  When your blood pressure is higher when you are in a health care provider's office than  when you are at home, this is called white coat hypertension. Most people with this condition do not need medicines.  When your blood pressure is higher at home than when you are in a health care provider's office, this is called masked hypertension. Most people with this condition may need medicines to control blood pressure. If you have a high blood pressure reading during one visit or you have normal blood pressure with other risk factors, you may be asked to:  Return on a different day  to have your blood pressure checked again.  Monitor your blood pressure at home for 1 week or longer. If you are diagnosed with hypertension, you may have other blood or imaging tests to help your health care provider understand your overall risk for other conditions. How is this treated? This condition is treated by making healthy lifestyle changes, such as eating healthy foods, exercising more, and reducing your alcohol intake. Your health care provider may prescribe medicine if lifestyle changes are not enough to get your blood pressure under control, and if:  Your systolic blood pressure is above 130.  Your diastolic blood pressure is above 80. Your personal target blood pressure may vary depending on your medical conditions, your age, and other factors. Follow these instructions at home: Eating and drinking   Eat a diet that is high in fiber and potassium, and low in sodium, added sugar, and fat. An example eating plan is called the DASH (Dietary Approaches to Stop Hypertension) diet. To eat this way: ? Eat plenty of fresh fruits and vegetables. Try to fill one half of your plate at each meal with fruits and vegetables. ? Eat whole grains, such as whole-wheat pasta, brown rice, or whole-grain bread. Fill about one fourth of your plate with whole grains. ? Eat or drink low-fat dairy products, such as skim milk or low-fat yogurt. ? Avoid fatty cuts of meat, processed or cured meats, and poultry with skin. Fill about one fourth of your plate with lean proteins, such as fish, chicken without skin, beans, eggs, or tofu. ? Avoid pre-made and processed foods. These tend to be higher in sodium, added sugar, and fat.  Reduce your daily sodium intake. Most people with hypertension should eat less than 1,500 mg of sodium a day.  Do not drink alcohol if: ? Your health care provider tells you not to drink. ? You are pregnant, may be pregnant, or are planning to become pregnant.  If you drink  alcohol: ? Limit how much you use to:  0-1 drink a day for women.  0-2 drinks a day for men. ? Be aware of how much alcohol is in your drink. In the [place] one drink equals one 12 oz bottle of beer (355 mL), one 5 oz glass of wine (148 mL), or one 1 oz glass of hard liquor (44 mL). Lifestyle   Work with your health care provider to maintain a healthy body weight or to lose weight. Ask what an ideal weight is for you.  Get at least 30 minutes of exercise most days of the week. Activities may include walking, swimming, or biking.  Include exercise to strengthen your muscles (resistance exercise), such as Pilates or lifting weights, as part of your weekly exercise routine. Try to do these types of exercises for 30 minutes at least 3 days a week.  Do not use any products that contain nicotine or tobacco, such as cigarettes, e-cigarettes, and chewing tobacco. If you need help quitting, ask  your health care provider.  Monitor your blood pressure at home as told by your health care provider.  Keep all follow-up visits as told by your health care provider. This is important. Medicines  Take over-the-counter and prescription medicines only as told by your health care provider. Follow directions carefully. Blood pressure medicines must be taken as prescribed.  Do not skip doses of blood pressure medicine. Doing this puts you at risk for problems and can make the medicine less effective.  Ask your health care provider about side effects or reactions to medicines that you should watch for. Contact a health care provider if you:  Think you are having a reaction to a medicine you are taking.  Have headaches that keep coming back (recurring).  Feel dizzy.  Have swelling in your ankles.  Have trouble with your vision. Get help right away if you:  Develop a severe headache or confusion.  Have unusual weakness or numbness.  Feel faint.  Have severe pain in your chest or abdomen.  Vomit  repeatedly.  Have trouble breathing. Summary  Hypertension is when the force of blood pumping through your arteries is too strong. If this condition is not controlled, it may put you at risk for serious complications.  Your personal target blood pressure may vary depending on your medical conditions, your age, and other factors. For most people, a normal blood pressure is less than 120/80.  Hypertension is treated with lifestyle changes, medicines, or a combination of both. Lifestyle changes include losing weight, eating a healthy, low-sodium diet, exercising more, and limiting alcohol. This information is not intended to replace advice given to you by your health care provider. Make sure you discuss any questions you have with your health care provider. Document Revised: 03/25/2018 Document Reviewed: 03/25/2018 Elsevier Patient Education  2020 [employer].

## 2020-05-02 DIAGNOSIS — R69 Illness, unspecified: Secondary | ICD-10-CM | POA: Diagnosis not present

## 2020-05-03 DIAGNOSIS — Z20822 Contact with and (suspected) exposure to covid-19: Secondary | ICD-10-CM | POA: Diagnosis not present

## 2020-05-09 ENCOUNTER — Other Ambulatory Visit: Payer: Self-pay

## 2020-05-09 DIAGNOSIS — E785 Hyperlipidemia, unspecified: Secondary | ICD-10-CM

## 2020-05-09 DIAGNOSIS — I10 Essential (primary) hypertension: Secondary | ICD-10-CM

## 2020-05-18 ENCOUNTER — Encounter: Payer: Medicare HMO | Admitting: Family Medicine

## 2020-05-18 ENCOUNTER — Telehealth: Payer: Self-pay

## 2020-05-18 ENCOUNTER — Encounter: Payer: Self-pay | Admitting: Family Medicine

## 2020-05-18 NOTE — Telephone Encounter (Signed)
Patient was scheduled for a physical with Dr. Yong Channel. LVM asking the patient to call our office in regards to his physical. Office number was provided.

## 2020-07-12 DIAGNOSIS — R69 Illness, unspecified: Secondary | ICD-10-CM | POA: Diagnosis not present

## 2020-07-13 ENCOUNTER — Other Ambulatory Visit: Payer: Self-pay | Admitting: Family Medicine

## 2020-07-31 ENCOUNTER — Other Ambulatory Visit: Payer: Self-pay | Admitting: Internal Medicine

## 2020-08-02 ENCOUNTER — Other Ambulatory Visit: Payer: Self-pay | Admitting: Family Medicine

## 2020-08-04 ENCOUNTER — Encounter: Payer: Self-pay | Admitting: Family Medicine
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
## 2020-08-07 NOTE — Patient Instructions (Addendum)
Please stop by lab before you go If you have mychart- we will send your results within 3 business days of [place] receiving them.  If you do not have mychart- we will call you about results within 5 business days of [place] receiving them.  *please also note that you will see labs on mychart as soon as they post. I will later go in and write notes on them- will say "notes from Dr. Yong Channel"  recommended trial of Flonase 1 spray each nostril in the morning with plan to add Xyzal or Claritin if no improvement within 3 weeks -also could try nasal saline rinses such as neil med sinus rinse before bed  Would recommend full abstinence from alcohol. Thrilled you are doing AA. Happy to enter referral for you when you find out exactly what you need for the DUI  This may work Please call [phone] to schedule a visit with [facility] -Trey Paula is an excellent counselor who is based out of our clinic  Health Maintenance Due  Topic Date Due  . COVID-19 Vaccine (3 - Pfizer risk 4-dose series) Patient will call back with the date.  10/22/2019   Please check with your pharmacy to see if they have the shingrix vaccine. If they do- please get this immunization and update [place] by phone call or mychart with dates you receive the vaccine    Recommended follow up: No follow-ups on file.

## 2020-08-08 ENCOUNTER — Other Ambulatory Visit: Payer: Self-pay

## 2020-08-08 ENCOUNTER — Encounter: Payer: Self-pay | Admitting: Family Medicine

## 2020-08-08 ENCOUNTER — Ambulatory Visit (INDEPENDENT_AMBULATORY_CARE_PROVIDER_SITE_OTHER): Payer: Medicare HMO | Admitting: Family Medicine

## 2020-08-08 VITALS — BP 104/64 | HR 60 | Temp 96.7°F | Ht 71.0 in | Wt 232.6 lb

## 2020-08-08 DIAGNOSIS — Z79899 Other long term (current) drug therapy: Secondary | ICD-10-CM

## 2020-08-08 DIAGNOSIS — K219 Gastro-esophageal reflux disease without esophagitis: Secondary | ICD-10-CM | POA: Diagnosis not present

## 2020-08-08 DIAGNOSIS — Z0000 Encounter for general adult medical examination without abnormal findings: Secondary | ICD-10-CM

## 2020-08-08 DIAGNOSIS — I10 Essential (primary) hypertension: Secondary | ICD-10-CM

## 2020-08-08 DIAGNOSIS — D692 Other nonthrombocytopenic purpura: Secondary | ICD-10-CM

## 2020-08-08 DIAGNOSIS — R69 Illness, unspecified: Secondary | ICD-10-CM | POA: Diagnosis not present

## 2020-08-08 DIAGNOSIS — Z9861 Coronary angioplasty status: Secondary | ICD-10-CM

## 2020-08-08 DIAGNOSIS — I2510 Atherosclerotic heart disease of native coronary artery without angina pectoris: Secondary | ICD-10-CM | POA: Diagnosis not present

## 2020-08-08 DIAGNOSIS — E785 Hyperlipidemia, unspecified: Secondary | ICD-10-CM

## 2020-08-08 DIAGNOSIS — G621 Alcoholic polyneuropathy: Secondary | ICD-10-CM

## 2020-08-08 DIAGNOSIS — F1020 Alcohol dependence, uncomplicated: Secondary | ICD-10-CM

## 2020-08-08 LAB — CBC WITH DIFFERENTIAL/PLATELET
Basophils Absolute: 0.1 K/uL (ref 0.0–0.1)
Basophils Relative: 0.9 % (ref 0.0–3.0)
Eosinophils Absolute: 0.1 K/uL (ref 0.0–0.7)
Eosinophils Relative: 2 % (ref 0.0–5.0)
HCT: 46.7 % (ref 39.0–52.0)
Hemoglobin: 15.9 g/dL (ref 13.0–17.0)
Lymphocytes Relative: 40.1 % (ref 12.0–46.0)
Lymphs Abs: 2.5 K/uL (ref 0.7–4.0)
MCHC: 34 g/dL (ref 30.0–36.0)
MCV: 100.8 fl — ABNORMAL HIGH (ref 78.0–100.0)
Monocytes Absolute: 0.5 K/uL (ref 0.1–1.0)
Monocytes Relative: 8.6 % (ref 3.0–12.0)
Neutro Abs: 3 K/uL (ref 1.4–7.7)
Neutrophils Relative %: 48.4 % (ref 43.0–77.0)
Platelets: 170 K/uL (ref 150.0–400.0)
RBC: 4.63 Mil/uL (ref 4.22–5.81)
RDW: 13.3 % (ref 11.5–15.5)
WBC: 6.2 K/uL (ref 4.0–10.5)

## 2020-08-08 LAB — COMPREHENSIVE METABOLIC PANEL WITH GFR
ALT: 24 U/L (ref 0–53)
AST: 29 U/L (ref 0–37)
Albumin: 4.1 g/dL (ref 3.5–5.2)
Alkaline Phosphatase: 36 U/L — ABNORMAL LOW (ref 39–117)
BUN: 14 mg/dL (ref 6–23)
CO2: 27 meq/L (ref 19–32)
Calcium: 9.4 mg/dL (ref 8.4–10.5)
Chloride: 104 meq/L (ref 96–112)
Creatinine, Ser: 1.02 mg/dL (ref 0.40–1.50)
GFR: 73.3 mL/min
Glucose, Bld: 110 mg/dL — ABNORMAL HIGH (ref 70–99)
Potassium: 3.7 meq/L (ref 3.5–5.1)
Sodium: 136 meq/L (ref 135–145)
Total Bilirubin: 0.6 mg/dL (ref 0.2–1.2)
Total Protein: 7.2 g/dL (ref 6.0–8.3)

## 2020-08-08 LAB — LIPID PANEL
Cholesterol: 164 mg/dL (ref 0–200)
HDL: 65.4 mg/dL
LDL Cholesterol: 74 mg/dL (ref 0–99)
NonHDL: 98.47
Total CHOL/HDL Ratio: 3
Triglycerides: 121 mg/dL (ref 0.0–149.0)
VLDL: 24.2 mg/dL (ref 0.0–40.0)

## 2020-08-08 LAB — VITAMIN B12: Vitamin B-12: 381 pg/mL (ref 211–911)

## 2020-08-11 ENCOUNTER — Telehealth: Payer: Self-pay

## 2020-08-11 NOTE — Chronic Care Management (AMB) (Signed)
    Chronic Care Management Pharmacy Assistant   Name: Charles Villa  MRN: [id number] DOB: [birth year]  Reason for Encounter: Disease State/General Adherence Call   PCP : Marin Olp, MD  Allergies:  No Known Allergies  Medications: Outpatient Encounter Medications as of 08/11/2020  Medication Sig Note  . aspirin EC 81 MG tablet Take 81 mg by mouth daily.   . bisoprolol-hydrochlorothiazide (ZIAC) 2.5-6.25 MG tablet TAKE 1 TABLET BY MOUTH EVERY DAY   . clobetasol (TEMOVATE) 0.05 % external solution Apply 1 application topically at bedtime as needed (scalp).    . clobetasol cream (TEMOVATE) 9.62 % Apply 1 application topically 2 (two) times daily as needed (abdomen).    . clotrimazole-betamethasone (LOTRISONE) cream Apply 1 application topically 2 (two) times daily as needed. psoriass   . desonide (DESONATE) 0.05 % gel Apply 1 application topically 2 (two) times daily as needed. Skin lesions   . fluocinolone (VANOS) 0.01 % cream Apply 1 application topically daily as needed.    . hydrocortisone (ANUSOL-HC) 2.5 % rectal cream Place 1 application rectally 2 (two) times daily as needed for hemorrhoids or anal itching.   . hydrocortisone 2.5 % cream Apply 1 application topically 2 (two) times daily as needed (face).    [name] icosapent Ethyl (VASCEPA) 1 g capsule Take 2 capsules (2 g total) by mouth in the morning and at bedtime. NEED OV. 02/11/2020: Pt taking one a day  . pantoprazole (PROTONIX) 40 MG tablet TAKE 1 TABLET BY MOUTH EVERY DAY   . rosuvastatin (CRESTOR) 40 MG tablet TAKE 1 TABLET BY MOUTH EVERY DAY   . silver sulfADIAZINE (SILVADENE) 1 % cream Apply 1 application topically 2 (two) times daily as needed (groin).     No facility-administered encounter medications on file as of 08/11/2020.    Current Diagnosis: Patient Active Problem List   Diagnosis Date Noted  . History of adenomatous polyp of colon 11/02/2019  . Senile purpura ([place]) 05/06/2019  . Alcoholic peripheral  neuropathy ([place]) 02/09/2019  . Thiamine deficiency 04/28/2017  . CAD S/P percutaneous coronary angioplasty 02/24/2017  . S/P cardiac catheterization 02/24/2017  . Alcoholism in recovery ([place]) 02/03/2017  . Tremor 05/06/2016  . OCD (obsessive compulsive disorder) 01/23/2016  . Essential hypertension 03/11/2014  . Anal fissure 08/17/2012  . SQUAMOUS CELL CARCINOMA OF SKIN SITE UNSPECIFIED 08/24/2010  . GERD 02/23/2007  . Hyperlipidemia with target low density lipoprotein (LDL) cholesterol less than 70 mg/dL 02/10/2007  . PSORIASIS 02/10/2007  . BACK PAIN, CHRONIC 02/10/2007     Have you had any problems recently with your health?  Patient states he des not have as much energy as he used to.  Have you had any problems with your pharmacy?  Patient states he has not had any problems with his pharmacy or getting any of his medications.   What issues or side effects are you having with your medications?  Patient states he is not currently having any issues or side effects from any of his medications at this time.  What would you like me to pass along to Madelin Rear, CPP for them to help you with?   Patient states he can not think of anything to pass along to the pharmacist at this time.  What can we do to take care of you better?  Patient states he has great care already and is very thankful.   April D Calhoun, [place] Pharmacist Assistant [phone]   Follow-Up:  Pharmacist Review

## 2020-08-17 ENCOUNTER — Other Ambulatory Visit: Payer: Self-pay | Admitting: Internal Medicine

## 2020-08-18 DIAGNOSIS — R69 Illness, unspecified: Secondary | ICD-10-CM | POA: Diagnosis not present

## 2020-08-28 DIAGNOSIS — R69 Illness, unspecified: Secondary | ICD-10-CM | POA: Diagnosis not present

## 2020-08-30 DIAGNOSIS — R69 Illness, unspecified: Secondary | ICD-10-CM | POA: Diagnosis not present

## 2020-09-01 DIAGNOSIS — R69 Illness, unspecified: Secondary | ICD-10-CM | POA: Diagnosis not present

## 2020-09-04 DIAGNOSIS — R69 Illness, unspecified: Secondary | ICD-10-CM | POA: Diagnosis not present

## 2020-09-05 DIAGNOSIS — R69 Illness, unspecified: Secondary | ICD-10-CM | POA: Diagnosis not present

## 2020-09-06 DIAGNOSIS — R69 Illness, unspecified: Secondary | ICD-10-CM | POA: Diagnosis not present

## 2020-09-08 DIAGNOSIS — R69 Illness, unspecified: Secondary | ICD-10-CM | POA: Diagnosis not present

## 2020-09-11 DIAGNOSIS — R69 Illness, unspecified: Secondary | ICD-10-CM | POA: Diagnosis not present

## 2020-09-12 DIAGNOSIS — R69 Illness, unspecified: Secondary | ICD-10-CM | POA: Diagnosis not present

## 2020-09-13 DIAGNOSIS — R69 Illness, unspecified: Secondary | ICD-10-CM | POA: Diagnosis not present

## 2020-09-15 DIAGNOSIS — R69 Illness, unspecified: Secondary | ICD-10-CM | POA: Diagnosis not present

## 2020-09-18 DIAGNOSIS — R69 Illness, unspecified: Secondary | ICD-10-CM | POA: Diagnosis not present

## 2020-09-19 DIAGNOSIS — R69 Illness, unspecified: Secondary | ICD-10-CM | POA: Diagnosis not present

## 2020-09-20 DIAGNOSIS — R69 Illness, unspecified: Secondary | ICD-10-CM | POA: Diagnosis not present

## 2020-09-22 DIAGNOSIS — R69 Illness, unspecified: Secondary | ICD-10-CM | POA: Diagnosis not present

## 2020-09-25 DIAGNOSIS — R69 Illness, unspecified: Secondary | ICD-10-CM | POA: Diagnosis not present

## 2020-09-26 DIAGNOSIS — R69 Illness, unspecified: Secondary | ICD-10-CM | POA: Diagnosis not present

## 2020-09-27 DIAGNOSIS — R69 Illness, unspecified: Secondary | ICD-10-CM | POA: Diagnosis not present

## 2020-09-29 DIAGNOSIS — R69 Illness, unspecified: Secondary | ICD-10-CM | POA: Diagnosis not present

## 2020-10-02 DIAGNOSIS — R69 Illness, unspecified: Secondary | ICD-10-CM | POA: Diagnosis not present

## 2020-10-04 DIAGNOSIS — R69 Illness, unspecified: Secondary | ICD-10-CM | POA: Diagnosis not present

## 2020-10-06 DIAGNOSIS — R69 Illness, unspecified: Secondary | ICD-10-CM | POA: Diagnosis not present

## 2020-10-08 ENCOUNTER — Encounter: Payer: Self-pay | Admitting: Family Medicine

## 2020-10-09 DIAGNOSIS — R69 Illness, unspecified: Secondary | ICD-10-CM | POA: Diagnosis not present

## 2020-10-11 DIAGNOSIS — R69 Illness, unspecified: Secondary | ICD-10-CM | POA: Diagnosis not present

## 2020-10-13 DIAGNOSIS — R69 Illness, unspecified: Secondary | ICD-10-CM | POA: Diagnosis not present

## 2020-10-16 DIAGNOSIS — R69 Illness, unspecified: Secondary | ICD-10-CM | POA: Diagnosis not present

## 2020-10-18 DIAGNOSIS — R69 Illness, unspecified: Secondary | ICD-10-CM | POA: Diagnosis not present

## 2020-10-20 DIAGNOSIS — R69 Illness, unspecified: Secondary | ICD-10-CM | POA: Diagnosis not present

## 2020-10-23 DIAGNOSIS — R69 Illness, unspecified: Secondary | ICD-10-CM | POA: Diagnosis not present

## 2020-10-25 DIAGNOSIS — R69 Illness, unspecified: Secondary | ICD-10-CM | POA: Diagnosis not present

## 2020-10-27 ENCOUNTER — Encounter: Payer: Self-pay | Admitting: Internal Medicine

## 2020-10-27 ENCOUNTER — Ambulatory Visit: Payer: Medicare HMO | Admitting: Internal Medicine

## 2020-10-27 ENCOUNTER — Other Ambulatory Visit: Payer: Self-pay

## 2020-10-27 VITALS — BP 109/63 | HR 71 | Ht 71.0 in | Wt 232.6 lb

## 2020-10-27 DIAGNOSIS — I2510 Atherosclerotic heart disease of native coronary artery without angina pectoris: Secondary | ICD-10-CM

## 2020-10-27 DIAGNOSIS — E785 Hyperlipidemia, unspecified: Secondary | ICD-10-CM | POA: Diagnosis not present

## 2020-10-27 DIAGNOSIS — R69 Illness, unspecified: Secondary | ICD-10-CM | POA: Diagnosis not present

## 2020-10-27 DIAGNOSIS — Z9861 Coronary angioplasty status: Secondary | ICD-10-CM

## 2020-10-27 DIAGNOSIS — R0789 Other chest pain: Secondary | ICD-10-CM

## 2020-10-27 NOTE — Patient Instructions (Signed)
Medication Instructions:  Your physician recommends that you continue on your current medications as directed. Please refer to the Current Medication list given to you today.  *If you need a refill on your cardiac medications before your next appointment, please call your pharmacy*   Lab Work: FASTING lab work to check cholesterol in 6 months  If you have labs (blood work) drawn today and your tests are completely normal, you will receive your results only by: [name] MyChart Message (if you have MyChart) OR . A paper copy in the mail If you have any lab test that is abnormal or we need to change your treatment, we will call you to review the results.   Testing/Procedures: NONE   Follow-Up: At [facility], you and your health needs are our priority.  As part of our continuing mission to provide you with exceptional heart care, we have created designated Provider Care Teams.  These Care Teams include your primary Cardiologist (physician) and Advanced Practice Providers (APPs -  Physician Assistants and Nurse Practitioners) who all work together to provide you with the care you need, when you need it.  We recommend signing up for the patient portal called "MyChart".  Sign up information is provided on this After Visit Summary.  MyChart is used to connect with patients for Virtual Visits (Telemedicine).  Patients are able to view lab/test results, encounter notes, upcoming appointments, etc.  Non-urgent messages can be sent to your provider as well.   To learn more about what you can do with MyChart, go to [email or web address].    Your next appointment:   6 month(s)  The format for your next appointment:   In Person  Provider:   You may see Dr. Debara Pickett or one of the following Advanced Practice Providers on your designated Care Team:    Almyra Deforest, PA-C  Fabian Sharp, [place] or   Roby Lofts, [place]    Other Instructions

## 2020-10-27 NOTE — Progress Notes (Signed)
OFFICE CONSULT NOTE  Chief Complaint:  Routine follow-up  Primary Care Physician: Marin Olp, MD  HPI:  Charles Villa is a 75 y.o. male who is being seen today for the evaluation of shortness of breath at the request of Marin Olp, MD. Mr. Genova has a history of hypertension, dyslipidemia, GERD, chronic back pain, OCD and long-standing alcohol use, according to his wife who did most the talking during the visit he drinks about 2-3 drinks per day however could be more. She says that's increased more recently. She notes that over the past 2 years she's had progressively worsening shortness of breath. 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 They have a vacation house at [facility] and he has approximate 65 steps to climb up. He says he gets short of breath walking up the stairs and has been more difficult to do that without stopping. He seems to minimize his symptoms. His wife is concerned about possible coronary artery disease as a cause of this. She used to work in cardiac rehabilitation, possibly is an Physiological scientist. There is a family history of hypertension and stroke in his mother and his father who was a smoker died of lung cancer. He denies any chest pain or pressure, palpitations, presyncope or syncopal symptoms. He also denies any fever, chills, cough or other infective symptoms.  02/20/2017  Charles Villa returns today for follow-up. He underwent echocardiogram as well as an exercise treadmill stress test. The exercise treadmill stress test unfortunately was abnormal indicating ST segment depression in leads 23 aVF and V5 and V6 beginning at 5 minutes a stress returning to baseline after about 5-9 minutes of recovery. His echocardiogram demonstrated normal systolic function, mild diastolic dysfunction and some mild valvular abnormalities. I reviewed the results for him today along with his wife and my recommendations for left  heart catheterization.  03/17/2017  Charles Villa was seen today in follow-up. He underwent a graded exercise tolerance test which was abnormal indicating ST segment changes concerning for ischemia. He was subsequently referred for cardiac catheterization. That was performed on 02/24/2017 by Dr. Ellyn Hack which indicated a 90% mid circumflex lesion. This was stented with a Promus Premier 3.5 x 16 mm drug-eluting stent. It was noted that there was moderate disease in the ostial and mid LAD.[name] LVEF was 55-65% with normal LV and diastolic pressure. After the procedure he reports an improvement in his breathing however he does not feel that he is back to "baseline". I explained him that there may be certainly other factors causing him to be short of breath including his significant weight gain and abdominal obesity. We also discussed the fact that he is on dual antiplatelet therapy and that continued alcohol use is not advisable. Fortunately he is agreed to cardiac rehabilitation. I feel that will help with both weight management and cardiac vascular conditioning.  09/09/2017  Charles Villa was seen today in follow-up.  He continues to do well.  He completed cardiac rehabilitation.  He denies any chest pain or worsening shortness of breath.  He does feel some fatigue and blood pressure was noted to be low today 94/62.  He said that is unusual as typically his blood pressures around 024 097 systolic.  EKG shows sinus rhythm with no ischemia today.  He is on dual antiplatelet therapy with aspirin and Plavix which we will plan to continue for 6 months.  03/25/2018  Charles Villa returns today for follow-up.  Overall he  reports feeling well and is generally asymptomatic.  He says he is able to walk up and down the hill to his [place] house at [facility] now without much difficulty.  He mentioned that he has been able to cut back on his alcohol somewhat but has not quit completely.  He is tolerating his heart  failure medicines.  Blood pressure is excellent today 110/70.  He is now a year out from stenting to the mid circumflex artery.  He continues on aspirin and Plavix.  09/22/2018  Charles Villa is seen today in follow-up.  Recently says that his alcohol intake is gone up a little more.  He is now about 3 drinks a day.  He denies any worsening shortness of breath.  His most recent lipid profile showed a total cholesterol 173, triglycerides 305, HDL 70 and LDL direct of 76.  This is on 40 of rosuvastatin.  We discussed recent trial data from REDUCE-IT study indicating that Vascepa (icosapent ethyl) showed significant cardiovascular risk reduction in patients elevated triglycerides persistently at goal LDL between 150 and 500.  I think he is an ideal candidate for this medication.  Of course alcohol is also likely contributing to his elevated triglycerides as well.  03/30/2020  Charles Villa Is seen today in follow-up.  Overall he feels well.  His blood pressure was a little low today but he says he is asymptomatic with that.  He has not been taking the Vascepa 4 g daily rather only 1 g daily because of the cost he wanted to spread out the medicine.  He also noted recently that his triglycerides were lower and not feel that he needed the full 4 g dose.  He and his wife recently sold their [place] and he has been less active.  He occasionally feels a little run down and wonders if this could be coronary disease or whether additional stress testing is necessary.  EKG shows no ischemic changes rather a sinus bradycardia at 58.  He reports continued alcohol use not worse or significantly less than previous.  10/29/2020  Charles Villa returns today for follow-up.  In general he seems to be doing pretty well.  He significantly cut back alcohol use.  He is trying to eat healthier.  He told me unfortunately his wife had an acute MI and received a stent.  She is still now recovering and they are working to try to change  their diets together.  He did have recent repeat lipids however which showed further improvement in his numbers.  Total cholesterol is now 164, triglycerides have normalized at 121, HDL is 65 and LDL 74.  He is very near target across the board for his lipids.  He does report he gets some occasional chest discomfort which is short-lived and not necessarily with exertion or relieved by rest.  He asked for [place] to repeat an EKG today which I did which showed normal sinus rhythm and incomplete right bundle branch block.  No ischemic changes  PMHx:  Past Medical History:  Diagnosis Date  . Adenomatous colon polyp 11/02/2019  . Anal fissure 08/17/2012   Really more gluteal crease irritation- protosol hc 2.5% prn   . BACK PAIN, CHRONIC 02/10/2007  . CAD (coronary artery disease), native coronary artery    02/24/17-PCI/DES to mLcx, normal EF.  . Diverticulosis of colon    2007  . GERD 02/23/2007  . GERD (gastroesophageal reflux disease)   . Hyperlipidemia   . Hypertension   .  OCD (obsessive compulsive disorder) 01/23/2016   Ocd/hoarding issues- psychiatristis- [place] started on zoloft 100mg --> paxil  . Personal history of colonic polyps 03/14/2014   08/2005. Benign. 10 year repeat. 2010 colonoscopy in record was entered erroneously.    [name] PSORIASIS 02/10/2007   Topical therapy.     . Scalp psoriasis   . SQUAMOUS CELL CARCINOMA OF SKIN SITE UNSPECIFIED 08/24/2010   Annotation: face     Past Surgical History:  Procedure Laterality Date  . COLONOSCOPY    . COLONOSCOPY W/ POLYPECTOMY     2007 [name]  . CORONARY STENT INTERVENTION N/A 02/24/2017   Procedure: Coronary Stent Intervention;  Surgeon: Leonie Man, MD;  Location: [facility];  Service: Cardiovascular;  Laterality: N/A;  . LEFT HEART CATH AND CORONARY ANGIOGRAPHY N/A 02/24/2017   Procedure: Left Heart Cath and Coronary Angiography;  Surgeon: Leonie Man, MD;  Location: [facility];  Service: Cardiovascular;  Laterality:  N/A;  . POLYPECTOMY    . SKIN CANCER EXCISION     squamous cell CA - nose  . TONSILLECTOMY      FAMHx:  Family History  Problem Relation Age of Onset  . Lung cancer Father   . Stroke Mother   . Hypertension Mother   . Colon polyps Neg Hx   . Colon cancer Neg Hx   . Esophageal cancer Neg Hx   . Rectal cancer Neg Hx   . Stomach cancer Neg Hx     SOCHx:   reports that he has never smoked. He has never used smokeless tobacco. He reports current alcohol use of about 21.0 standard drinks of alcohol per week. He reports that he does not use drugs.  ALLERGIES:  No Known Allergies  ROS: Pertinent items noted in HPI and remainder of comprehensive ROS otherwise negative.  HOME MEDS: Current Outpatient Medications on File Prior to Visit  Medication Sig Dispense Refill  . aspirin EC 81 MG tablet Take 81 mg by mouth daily.    . bisoprolol-hydrochlorothiazide (ZIAC) 2.5-6.25 MG tablet TAKE 1 TABLET BY MOUTH EVERY DAY 90 tablet 3  . clobetasol (TEMOVATE) 0.05 % external solution Apply 1 application topically at bedtime as needed (scalp).   5  . clobetasol cream (TEMOVATE) 1.02 % Apply 1 application topically 2 (two) times daily as needed (abdomen).   3  . clotrimazole-betamethasone (LOTRISONE) cream Apply 1 application topically 2 (two) times daily as needed. psoriass    . desonide (DESONATE) 0.05 % gel Apply 1 application topically 2 (two) times daily as needed. Skin lesions    . fluocinolone (VANOS) 0.01 % cream Apply 1 application topically daily as needed.     . hydrocortisone (ANUSOL-HC) 2.5 % rectal cream Place 1 application rectally 2 (two) times daily as needed for hemorrhoids or anal itching.    . hydrocortisone 2.5 % cream Apply 1 application topically 2 (two) times daily as needed (face).   5  . icosapent Ethyl (VASCEPA) 1 g capsule Take 2 capsules (2 g total) by mouth 2 (two) times daily. 360 capsule 0  . pantoprazole (PROTONIX) 40 MG tablet TAKE 1 TABLET BY MOUTH EVERY DAY 90  tablet 3  . rosuvastatin (CRESTOR) 40 MG tablet TAKE 1 TABLET BY MOUTH EVERY DAY 90 tablet 1  . silver sulfADIAZINE (SILVADENE) 1 % cream Apply 1 application topically 2 (two) times daily as needed (groin).   5   No current facility-administered medications on file prior to visit.    LABS/IMAGING:  No results found for this or any previous visit (from the past 48 hour(s)). No results found.  LIPID PANEL:    Component Value Date/Time   CHOL 164 08/08/2020 1029   TRIG 121.0 08/08/2020 1029   TRIG 89 08/11/2006 0818   HDL 65.40 08/08/2020 1029   CHOLHDL 3 08/08/2020 1029   VLDL 24.2 08/08/2020 1029   LDLCALC 74 08/08/2020 1029   LDLDIRECT 55.0 11/15/2019 1032    WEIGHTS: Wt Readings from Last 3 Encounters:  10/27/20 232 lb 9.6 oz (105.5 kg)  08/08/20 232 lb 9.6 oz (105.5 kg)  04/19/20 228 lb 3.2 oz (103.5 kg)    VITALS: BP 109/63   Pulse 71   Ht 5\' 11"  (1.803 m)   Wt 232 lb 9.6 oz (105.5 kg)   SpO2 97%   BMI 32.44 kg/m   EXAM: General appearance: alert and no distress Neck: no carotid bruit, no JVD and thyroid not enlarged, symmetric, no tenderness/mass/nodules Lungs: clear to auscultation bilaterally Heart: regular rate and rhythm, S1, S2 normal, no murmur, click, rub or gallop Abdomen: soft, non-tender; bowel sounds normal; no masses,  no organomegaly and obese Extremities: extremities normal, atraumatic, no cyanosis or edema Pulses: 2+ and symmetric Skin: Skin color, texture, turgor normal. No rashes or lesions Neurologic: Grossly normal Psych: Pleasant  EKG: Some normal sinus rhythm at 62, incomplete right bundle branch block-personally reviewed  ASSESSMENT: 1. Coronary artery disease status post PCI to the mid circumflex-Promus Premier 3.5 by 65mm DES (01/2017) 2. Alcohol abuse 3. Hypertension 4. Dyslipidemia 5. Obesity/Metabolic syndrome  PLAN: 1.   Mr. Soward seems to be doing very well.  He is made changes in his diet and decrease his alcohol use.   He has some infrequent chest pain symptoms which are atypical and his EKG was unchanged today.  I advised him to continue to monitor that will look for symptoms with exertion.  He should remain active and continue to work on weight loss and a physical activity.  Plan follow-up with me annually or sooner as necessary.  Pixie Casino, MD, [facility], [place] Director of the Advanced Lipid Disorders &  Cardiovascular Risk Reduction Clinic Diplomate of the American Board of Clinical Lipidology Attending Cardiologist  Direct Dial: [phone]  Fax: [phone]  Website:  [email or web address] 10/27/2020, 2:29 PM

## 2020-10-30 DIAGNOSIS — R69 Illness, unspecified: Secondary | ICD-10-CM | POA: Diagnosis not present

## 2020-10-31 ENCOUNTER — Ambulatory Visit: Payer: Medicare HMO

## 2020-10-31 NOTE — Progress Notes (Signed)
  Chronic Care Management   Outreach Note   Name: Charles Villa MRN: [id number] DOB: [birth year]  Referred by: Marin Olp, MD Reason for referral: Telephone Appointment with [place] Pharmacist, Madelin Rear.   An unsuccessful telephone outreach was attempted today. The patient was referred to the pharmacist for assistance with care management and care coordination.   Telephone appointment with clinical pharmacist today (10/31/2020) at 1pm. If patient immediately returns call, transfer to [phone]. Otherwise, please provide this number so patient can reschedule visit.   Was hoping to help pt get set up with [employer] grant as previously discussed. Researched grant and see that it is open and accepting enrollees - if approved would help cover his cholesterol medications including vascepa  Madelin Rear, Pharm.D., BCGP Clinical Pharmacist [name] [phone]  ______________________  Recent consult visits: 10/27/2020 (Dr Rae Roam notes):  He significantly cut back alcohol use.  He is trying to eat healthier.  He told me unfortunately his wife had an acute MI and received a stent. Total cholesterol is now 164, triglycerides have normalized at 121, HDL is 65 and LDL 74.  He is very near target across the board for his lipids.  He does report he gets some occasional chest discomfort which is short-lived and not necessarily with exertion or relieved by rest.  He asked for [place] to repeat an EKG today which I did which showed normal sinus rhythm and incomplete right bundle branch block.  No ischemic changes.   Fill hx review - no gaps noted. -bisoprolol-hctz 2.5-6.25 (07/13/2020, 10/08/2020), rosuvastatin 40 mg (07/13/2020, 09/19/2020)

## 2020-11-01 DIAGNOSIS — R69 Illness, unspecified: Secondary | ICD-10-CM | POA: Diagnosis not present

## 2020-11-03 DIAGNOSIS — R69 Illness, unspecified: Secondary | ICD-10-CM | POA: Diagnosis not present

## 2020-11-06 DIAGNOSIS — R69 Illness, unspecified: Secondary | ICD-10-CM | POA: Diagnosis not present

## 2020-11-08 DIAGNOSIS — R69 Illness, unspecified: Secondary | ICD-10-CM | POA: Diagnosis not present

## 2020-11-10 DIAGNOSIS — R69 Illness, unspecified: Secondary | ICD-10-CM | POA: Diagnosis not present

## 2020-11-13 DIAGNOSIS — R69 Illness, unspecified: Secondary | ICD-10-CM | POA: Diagnosis not present

## 2020-11-15 DIAGNOSIS — R69 Illness, unspecified: Secondary | ICD-10-CM | POA: Diagnosis not present

## 2020-11-20 DIAGNOSIS — R69 Illness, unspecified: Secondary | ICD-10-CM | POA: Diagnosis not present

## 2020-11-21 DIAGNOSIS — R69 Illness, unspecified: Secondary | ICD-10-CM | POA: Diagnosis not present

## 2020-11-22 DIAGNOSIS — R69 Illness, unspecified: Secondary | ICD-10-CM | POA: Diagnosis not present

## 2020-11-24 DIAGNOSIS — R69 Illness, unspecified: Secondary | ICD-10-CM | POA: Diagnosis not present

## 2020-11-27 DIAGNOSIS — R69 Illness, unspecified: Secondary | ICD-10-CM | POA: Diagnosis not present

## 2020-11-28 DIAGNOSIS — R69 Illness, unspecified: Secondary | ICD-10-CM | POA: Diagnosis not present

## 2020-11-29 DIAGNOSIS — R69 Illness, unspecified: Secondary | ICD-10-CM | POA: Diagnosis not present

## 2020-12-01 DIAGNOSIS — R69 Illness, unspecified: Secondary | ICD-10-CM | POA: Diagnosis not present

## 2020-12-04 DIAGNOSIS — R69 Illness, unspecified: Secondary | ICD-10-CM | POA: Diagnosis not present

## 2020-12-05 DIAGNOSIS — R69 Illness, unspecified: Secondary | ICD-10-CM | POA: Diagnosis not present

## 2020-12-06 DIAGNOSIS — R69 Illness, unspecified: Secondary | ICD-10-CM | POA: Diagnosis not present

## 2020-12-08 DIAGNOSIS — R69 Illness, unspecified: Secondary | ICD-10-CM | POA: Diagnosis not present

## 2020-12-11 DIAGNOSIS — R69 Illness, unspecified: Secondary | ICD-10-CM | POA: Diagnosis not present

## 2020-12-18 DIAGNOSIS — R69 Illness, unspecified: Secondary | ICD-10-CM | POA: Diagnosis not present

## 2020-12-21 DIAGNOSIS — R69 Illness, unspecified: Secondary | ICD-10-CM | POA: Diagnosis not present

## 2020-12-25 DIAGNOSIS — R69 Illness, unspecified: Secondary | ICD-10-CM | POA: Diagnosis not present

## 2021-01-01 DIAGNOSIS — R69 Illness, unspecified: Secondary | ICD-10-CM | POA: Diagnosis not present

## 2021-01-02 DIAGNOSIS — R69 Illness, unspecified: Secondary | ICD-10-CM | POA: Diagnosis not present

## 2021-01-03 DIAGNOSIS — R69 Illness, unspecified: Secondary | ICD-10-CM | POA: Diagnosis not present

## 2021-01-04 DIAGNOSIS — R69 Illness, unspecified: Secondary | ICD-10-CM | POA: Diagnosis not present

## 2021-01-05 DIAGNOSIS — R69 Illness, unspecified: Secondary | ICD-10-CM | POA: Diagnosis not present

## 2021-01-08 DIAGNOSIS — R69 Illness, unspecified: Secondary | ICD-10-CM | POA: Diagnosis not present

## 2021-01-09 DIAGNOSIS — R69 Illness, unspecified: Secondary | ICD-10-CM | POA: Diagnosis not present

## 2021-01-10 DIAGNOSIS — R69 Illness, unspecified: Secondary | ICD-10-CM | POA: Diagnosis not present

## 2021-01-12 DIAGNOSIS — R69 Illness, unspecified: Secondary | ICD-10-CM | POA: Diagnosis not present

## 2021-01-15 DIAGNOSIS — R69 Illness, unspecified: Secondary | ICD-10-CM | POA: Diagnosis not present

## 2021-01-16 DIAGNOSIS — R69 Illness, unspecified: Secondary | ICD-10-CM | POA: Diagnosis not present

## 2021-01-17 DIAGNOSIS — R69 Illness, unspecified: Secondary | ICD-10-CM | POA: Diagnosis not present

## 2021-01-19 ENCOUNTER — Telehealth: Payer: Self-pay

## 2021-01-19 DIAGNOSIS — R69 Illness, unspecified: Secondary | ICD-10-CM | POA: Diagnosis not present

## 2021-01-19 NOTE — Chronic Care Management (AMB) (Signed)
    Chronic Care Management Pharmacy Assistant   Name: Charles Villa  MRN: [id number] DOB: December 31, 1945  Reason for Encounter: General adherence call   Recent office visits:  None  Recent consult visits:  None  Hospital visits:  None in previous 6 months  Medications: Outpatient Encounter Medications as of 01/19/2021  Medication Sig   aspirin EC 81 MG tablet Take 81 mg by mouth daily.   bisoprolol-hydrochlorothiazide (ZIAC) 2.5-6.25 MG tablet TAKE 1 TABLET BY MOUTH EVERY DAY   clobetasol (TEMOVATE) 0.05 % external solution Apply 1 application topically at bedtime as needed (scalp).    clobetasol cream (TEMOVATE) 3.41 % Apply 1 application topically 2 (two) times daily as needed (abdomen).    clotrimazole-betamethasone (LOTRISONE) cream Apply 1 application topically 2 (two) times daily as needed. psoriass   desonide (DESONATE) 0.05 % gel Apply 1 application topically 2 (two) times daily as needed. Skin lesions   fluocinolone (VANOS) 0.01 % cream Apply 1 application topically daily as needed.    hydrocortisone (ANUSOL-HC) 2.5 % rectal cream Place 1 application rectally 2 (two) times daily as needed for hemorrhoids or anal itching.   hydrocortisone 2.5 % cream Apply 1 application topically 2 (two) times daily as needed (face).    icosapent Ethyl (VASCEPA) 1 g capsule Take 2 capsules (2 g total) by mouth 2 (two) times daily.   pantoprazole (PROTONIX) 40 MG tablet TAKE 1 TABLET BY MOUTH EVERY DAY   rosuvastatin (CRESTOR) 40 MG tablet TAKE 1 TABLET BY MOUTH EVERY DAY   silver sulfADIAZINE (SILVADENE) 1 % cream Apply 1 application topically 2 (two) times daily as needed (groin).    No facility-administered encounter medications on file as of 01/19/2021.   Have you had any problems recently with your health? Patient denies problems with his health.   Have you had any problems with your pharmacy? Patient denies problems with his pharmacy.   What issues or side effects are you having  with your medications? Patient denies side effects from his medications.   What would you like me to pass along to [facility] Nasuti Potts,CPP for them to help you with?  Patient stated there is nothing to pass along.  He has an appointment with Dr Yong Channel in a few weeks and is looking forward to it.   What can we do to take care of you better? Patient stated there is nothing else we can do for him at this time.   Star Rating Drugs:     Rosuvastatin 40mg    Last filled 12/20/20  90 DS  [place]

## 2021-01-22 DIAGNOSIS — R69 Illness, unspecified: Secondary | ICD-10-CM | POA: Diagnosis not present

## 2021-01-23 DIAGNOSIS — R69 Illness, unspecified: Secondary | ICD-10-CM | POA: Diagnosis not present

## 2021-01-24 DIAGNOSIS — R69 Illness, unspecified: Secondary | ICD-10-CM | POA: Diagnosis not present

## 2021-01-25 ENCOUNTER — Telehealth: Payer: Self-pay

## 2021-01-25 NOTE — Chronic Care Management (AMB) (Signed)
    Chronic Care Management Pharmacy Assistant   Name: [name]  MRN: [id number] DOB: 07/05/1946  Reason for Encounter: Chart Review   Medications: Outpatient Encounter Medications as of 01/25/2021  Medication Sig   aspirin EC 81 MG tablet Take 81 mg by mouth daily.   bisoprolol-hydrochlorothiazide (ZIAC) 2.5-6.25 MG tablet TAKE 1 TABLET BY MOUTH EVERY DAY   clobetasol (TEMOVATE) 0.05 % external solution Apply 1 application topically at bedtime as needed (scalp).    clobetasol cream (TEMOVATE) 3.83 % Apply 1 application topically 2 (two) times daily as needed (abdomen).    clotrimazole-betamethasone (LOTRISONE) cream Apply 1 application topically 2 (two) times daily as needed. psoriass   desonide (DESONATE) 0.05 % gel Apply 1 application topically 2 (two) times daily as needed. Skin lesions   fluocinolone (VANOS) 0.01 % cream Apply 1 application topically daily as needed.    hydrocortisone (ANUSOL-HC) 2.5 % rectal cream Place 1 application rectally 2 (two) times daily as needed for hemorrhoids or anal itching.   hydrocortisone 2.5 % cream Apply 1 application topically 2 (two) times daily as needed (face).    icosapent Ethyl (VASCEPA) 1 g capsule Take 2 capsules (2 g total) by mouth 2 (two) times daily.   pantoprazole (PROTONIX) 40 MG tablet TAKE 1 TABLET BY MOUTH EVERY DAY   rosuvastatin (CRESTOR) 40 MG tablet TAKE 1 TABLET BY MOUTH EVERY DAY   silver sulfADIAZINE (SILVADENE) 1 % cream Apply 1 application topically 2 (two) times daily as needed (groin).    No facility-administered encounter medications on file as of 01/25/2021.   Reviewed chart for medication changes and adherence.   No OVs, Consults, or hospital visits since last care coordination call / Pharmacist visit. No medication changes indicated  No gaps in adherence identified. No further action required.  Wilford Sports CPA, CMA

## 2021-01-26 DIAGNOSIS — R69 Illness, unspecified: Secondary | ICD-10-CM | POA: Diagnosis not present

## 2021-01-29 DIAGNOSIS — R69 Illness, unspecified: Secondary | ICD-10-CM | POA: Diagnosis not present

## 2021-01-30 DIAGNOSIS — R69 Illness, unspecified: Secondary | ICD-10-CM | POA: Diagnosis not present

## 2021-01-31 DIAGNOSIS — R69 Illness, unspecified: Secondary | ICD-10-CM | POA: Diagnosis not present

## 2021-02-02 DIAGNOSIS — R69 Illness, unspecified: Secondary | ICD-10-CM | POA: Diagnosis not present

## 2021-02-05 ENCOUNTER — Telehealth: Payer: Self-pay | Admitting: Family Medicine

## 2021-02-05 DIAGNOSIS — R69 Illness, unspecified: Secondary | ICD-10-CM | POA: Diagnosis not present

## 2021-02-05 NOTE — Telephone Encounter (Signed)
Attempted to schedule AWV. Unable to LVM.  Will try at later time.  Mailbox is full

## 2021-02-06 ENCOUNTER — Ambulatory Visit (INDEPENDENT_AMBULATORY_CARE_PROVIDER_SITE_OTHER): Payer: Medicare HMO | Admitting: Family Medicine

## 2021-02-06 ENCOUNTER — Other Ambulatory Visit: Payer: Self-pay

## 2021-02-06 ENCOUNTER — Encounter: Payer: Self-pay | Admitting: Family Medicine

## 2021-02-06 VITALS — BP 100/62 | HR 58 | Temp 97.2°F | Ht 71.0 in | Wt 227.6 lb

## 2021-02-06 DIAGNOSIS — F1021 Alcohol dependence, in remission: Secondary | ICD-10-CM

## 2021-02-06 DIAGNOSIS — I2510 Atherosclerotic heart disease of native coronary artery without angina pectoris: Secondary | ICD-10-CM | POA: Diagnosis not present

## 2021-02-06 DIAGNOSIS — Z9861 Coronary angioplasty status: Secondary | ICD-10-CM

## 2021-02-06 DIAGNOSIS — G621 Alcoholic polyneuropathy: Secondary | ICD-10-CM

## 2021-02-06 DIAGNOSIS — I10 Essential (primary) hypertension: Secondary | ICD-10-CM | POA: Diagnosis not present

## 2021-02-06 DIAGNOSIS — E785 Hyperlipidemia, unspecified: Secondary | ICD-10-CM | POA: Diagnosis not present

## 2021-02-06 DIAGNOSIS — K219 Gastro-esophageal reflux disease without esophagitis: Secondary | ICD-10-CM

## 2021-02-06 DIAGNOSIS — R69 Illness, unspecified: Secondary | ICD-10-CM | POA: Diagnosis not present

## 2021-02-06 LAB — CBC WITH DIFFERENTIAL/PLATELET
Basophils Absolute: 0 10*3/uL (ref 0.0–0.1)
Basophils Relative: 0.9 % (ref 0.0–3.0)
Eosinophils Absolute: 0.1 10*3/uL (ref 0.0–0.7)
Eosinophils Relative: 1.9 % (ref 0.0–5.0)
HCT: 45.1 % (ref 39.0–52.0)
Hemoglobin: 15.6 g/dL (ref 13.0–17.0)
Lymphocytes Relative: 33.2 % (ref 12.0–46.0)
Lymphs Abs: 1.7 10*3/uL (ref 0.7–4.0)
MCHC: 34.6 g/dL (ref 30.0–36.0)
MCV: 98.1 fl (ref 78.0–100.0)
Monocytes Absolute: 0.6 10*3/uL (ref 0.1–1.0)
Monocytes Relative: 11 % (ref 3.0–12.0)
Neutro Abs: 2.8 10*3/uL (ref 1.4–7.7)
Neutrophils Relative %: 53 % (ref 43.0–77.0)
Platelets: 150 10*3/uL (ref 150.0–400.0)
RBC: 4.6 Mil/uL (ref 4.22–5.81)
RDW: 12.8 % (ref 11.5–15.5)
WBC: 5.2 10*3/uL (ref 4.0–10.5)

## 2021-02-06 LAB — COMPREHENSIVE METABOLIC PANEL
ALT: 22 U/L (ref 0–53)
AST: 26 U/L (ref 0–37)
Albumin: 4 g/dL (ref 3.5–5.2)
Alkaline Phosphatase: 34 U/L — ABNORMAL LOW (ref 39–117)
BUN: 16 mg/dL (ref 6–23)
CO2: 24 mEq/L (ref 19–32)
Calcium: 9.4 mg/dL (ref 8.4–10.5)
Chloride: 102 mEq/L (ref 96–112)
Creatinine, Ser: 1.13 mg/dL (ref 0.40–1.50)
GFR: 64.6 mL/min (ref >60.00)
Glucose, Bld: 105 mg/dL — ABNORMAL HIGH (ref 70–99)
Potassium: 3.6 mEq/L (ref 3.5–5.1)
Sodium: 136 mEq/L (ref 135–145)
Total Bilirubin: 0.5 mg/dL (ref 0.2–1.2)
Total Protein: 6.9 g/dL (ref 6.0–8.3)

## 2021-02-06 LAB — LDL CHOLESTEROL, DIRECT: Direct LDL: 76 mg/dL

## 2021-02-06 NOTE — Patient Instructions (Addendum)
Health Maintenance Due  Topic Date Due   Zoster Vaccines- Shingrix (1 of 2) Please check with your pharmacy to see if they have the shingrix vaccine. If they do- please get this immunization and update [place] by phone call or mychart with dates you receive the vaccine . Never done   Please stop by lab before you go If you have mychart- we will send your results within 3 business days of [place] receiving them.  If you do not have mychart- we will call you about results within 5 business days of [place] receiving them.  *please also note that you will see labs on mychart as soon as they post. I will later go in and write notes on them- will say "notes from Dr. Yong Channel"  In regards to your alcohol intake, I do want to try to encourage you to try to stop completely. Please continue your AAA meetings.  Recommended follow up: Return in about 6 months (around 08/09/2021) for a physical.

## 2021-02-06 NOTE — Progress Notes (Signed)
Phone [phone] In person visit   Subjective:   Charles Villa is a 75 y.o. year old very pleasant male patient who presents for/with See problem oriented charting Chief Complaint  Patient presents with   Hyperlipidemia   Hypertension   This visit occurred during the SARS-CoV-2 public health emergency.  Safety protocols were in place, including screening questions prior to the visit, additional usage of staff PPE, and extensive cleaning of exam room while observing appropriate contact time as indicated for disinfecting solutions.   Past Medical History-  Patient Active Problem List   Diagnosis Date Noted   CAD S/P percutaneous coronary angioplasty 02/24/2017    Priority: High   Alcoholism in recovery [facility]) 02/03/2017    Priority: High   History of adenomatous polyp of colon 11/02/2019    Priority: Medium   Alcoholic peripheral neuropathy ([place]) 02/09/2019    Priority: Medium   Thiamine deficiency 04/28/2017    Priority: Medium   Tremor 05/06/2016    Priority: Medium   OCD (obsessive compulsive disorder) 01/23/2016    Priority: Medium   Essential hypertension 03/11/2014    Priority: Medium   Hyperlipidemia with target low density lipoprotein (LDL) cholesterol less than 70 mg/dL 02/10/2007    Priority: Medium   PSORIASIS 02/10/2007    Priority: Medium   Senile purpura ([place]) 05/06/2019    Priority: Low   S/P cardiac catheterization 02/24/2017    Priority: Low   Anal fissure 08/17/2012    Priority: Low   SQUAMOUS CELL CARCINOMA OF SKIN SITE UNSPECIFIED 08/24/2010    Priority: Low   GERD 02/23/2007    Priority: Low   BACK PAIN, CHRONIC 02/10/2007    Priority: Low    Medications- reviewed and updated Current Outpatient Medications  Medication Sig Dispense Refill   aspirin EC 81 MG tablet Take 81 mg by mouth daily.     bisoprolol-hydrochlorothiazide (ZIAC) 2.5-6.25 MG tablet TAKE 1 TABLET BY MOUTH EVERY DAY 90 tablet 3   clobetasol (TEMOVATE) 0.05 % external  solution Apply 1 application topically at bedtime as needed (scalp).   5   clobetasol cream (TEMOVATE) 1.06 % Apply 1 application topically 2 (two) times daily as needed (abdomen).   3   clotrimazole-betamethasone (LOTRISONE) cream Apply 1 application topically 2 (two) times daily as needed. psoriass     desonide (DESONATE) 0.05 % gel Apply 1 application topically 2 (two) times daily as needed. Skin lesions     fluocinolone (VANOS) 0.01 % cream Apply 1 application topically daily as needed.      hydrocortisone (ANUSOL-HC) 2.5 % rectal cream Place 1 application rectally 2 (two) times daily as needed for hemorrhoids or anal itching.     hydrocortisone 2.5 % cream Apply 1 application topically 2 (two) times daily as needed (face).   5   icosapent Ethyl (VASCEPA) 1 g capsule Take 2 capsules (2 g total) by mouth 2 (two) times daily. (Patient taking differently: Take 2 g by mouth daily.) 360 capsule 0   pantoprazole (PROTONIX) 40 MG tablet TAKE 1 TABLET BY MOUTH EVERY DAY (Patient taking differently: every other day.) 90 tablet 3   rosuvastatin (CRESTOR) 40 MG tablet TAKE 1 TABLET BY MOUTH EVERY DAY 90 tablet 1   silver sulfADIAZINE (SILVADENE) 1 % cream Apply 1 application topically 2 (two) times daily as needed (groin).   5   No current facility-administered medications for this visit.     Objective:  BP 100/62   Pulse (!) 58   Temp (!)  97.2 F (36.2 C) (Temporal)   Ht 5\' 11"  (1.803 m)   Wt 227 lb 9.6 oz (103.2 kg)   SpO2 94%   BMI 31.74 kg/m  Gen: NAD, resting comfortably CV: RRR no murmurs rubs or gallops Lungs: CTAB no crackles, wheeze, rhonchi Abdomen: soft/nontender/nondistended/normal bowel sounds. No rebound or guarding.  Ext: no edema Skin: warm, dry     Assessment and Plan   #CAD-follows with Dr. Debara Pickett. Status post drug-eluting stent 2018 #hyperlipidemia-LDL goal under 70 S: Medication: Rosuvastatin 40 mg, Vascepa 2 g twice daily, aspirin 81 mg daily  In regards to CAD  potential symptoms - no chest pain or SOB reported. Did have some left shoulder pain and saw Dr. Debara Pickett- luckily evaluation was reassuring  Exercise limited. Has improved diet some Lab Results  Component Value Date   CHOL 164 08/08/2020   HDL 65.40 08/08/2020   LDLCALC 74 08/08/2020   LDLDIRECT 55.0 11/15/2019   TRIG 121.0 08/08/2020   CHOLHDL 3 08/08/2020  A/P: CAD asymptomatic- continue current meds.  HLD- slightly above goal last visit- update Direct LDL as has tried to make dietary adjustments- if above goal- consider zetia add on  #hypertension S: medication: Ziac 2.5-6.25Mg  Home readings #s: Has home cuff-typically not checking BP Readings from Last 3 Encounters:  02/06/21 100/62  10/27/20 109/63  08/08/20 104/64  A/P: Stable. Continue current medications.  No lightheadedness or dizzines  # GERD S:Medication: Protonix 40Mg  3-4 days a week. B12 levels adequate late 2020 and again 2022 A/P: stable continue current medsv   #Alcoholic neuropathy/alcohol dependence S: Medication: Thiamine through b complex.  Naltrexone in the past through Dr. Clovis Pu not helpful Symptoms: Numbness and tingling in feet- no worsening.  Denies significant pain  Current alcohol intake: At last visit reported 2-3 beverages per day but down from prior more elevated use, today reports 2 a day.  [facility] also [facility]  -charged with DUI prior to last visit in [place]. Last visit was doing AA 3 days a week - down to 1 a week plus alcohol rehab through [facility] 3 days a week A/P: improving but would prefer cessation- advised today. Continue [facility] meetings . Neuropathy stable  #Macrocytosis- likely related to alcohol intake. B12 and folate levels have been normal, pathology smear review was normal-no further work-up planned after 11/17/2019 unless worsening other cell lines    #Phlegm in the morning- previously thought possibly related to allergies. We recommended trial of Flonase 1 spray each  nostril in the morning with plan to add Xyzal or Claritin if no improvement in 2021- he has not tried these lately- variable lately   #Joint pain/suspected osteoarthritis S: Medication: I recommended Voltaren gel up to 4 times a day over ibuprofen due to CAD-can use very sparingly ibuprofen if needed- he is not using voltaren  Patient has seen [place] [facility] in the past for right knee. Also gets some pain in his fingers as well- some pain recently in hands- usually can get away with just 1 A/P: prefer to avoid nsaids if possible other than topical but he hasnt tolerated- continue current meds ibuprofen twice a week  Mild LLQ pain if gets constipated- improves with good BM. Stool softener a few days a week. Advised trial miralax perhaps once a week -abdominal exam reassuring- he will let me know if worsening symptoms  Recommended follow up: Return in about 6 months (around 08/09/2021) for a physical.  Lab/Order associations:   ICD-10-CM   1.  Hyperlipidemia with target low density lipoprotein (LDL) cholesterol less than 70 mg/dL  E78.5 CBC with Differential/Platelet    Comprehensive metabolic panel    LDL cholesterol, direct    2. Essential hypertension  I10     3. Gastroesophageal reflux disease, unspecified whether esophagitis present  K21.9     4. CAD S/P percutaneous coronary angioplasty  I25.10    Z98.61     5. Alcoholism in recovery ([place])  F10.21     6. Alcoholic peripheral neuropathy ([facility])  G62.1      No orders of the defined types were placed in this encounter.  I,Harris Phan,acting as a Education administrator for Garret Reddish, MD.,have documented all relevant documentation on the behalf of Garret Reddish, MD,as directed by  Garret Reddish, MD while in the presence of Garret Reddish, MD.  I, Garret Reddish, MD, have reviewed all documentation for this visit. The documentation on 02/06/21 for the exam, diagnosis, procedures, and orders are all accurate and complete.   Return precautions  advised.  Garret Reddish, MD

## 2021-02-07 DIAGNOSIS — R69 Illness, unspecified: Secondary | ICD-10-CM | POA: Diagnosis not present

## 2021-02-09 DIAGNOSIS — R69 Illness, unspecified: Secondary | ICD-10-CM | POA: Diagnosis not present

## 2021-02-12 ENCOUNTER — Other Ambulatory Visit: Payer: Self-pay | Admitting: Internal Medicine

## 2021-02-12 DIAGNOSIS — R69 Illness, unspecified: Secondary | ICD-10-CM | POA: Diagnosis not present

## 2021-02-13 DIAGNOSIS — R69 Illness, unspecified: Secondary | ICD-10-CM | POA: Diagnosis not present

## 2021-02-14 DIAGNOSIS — R69 Illness, unspecified: Secondary | ICD-10-CM | POA: Diagnosis not present

## 2021-02-15 DIAGNOSIS — R69 Illness, unspecified: Secondary | ICD-10-CM | POA: Diagnosis not present

## 2021-02-16 DIAGNOSIS — R69 Illness, unspecified: Secondary | ICD-10-CM | POA: Diagnosis not present

## 2021-02-19 DIAGNOSIS — R69 Illness, unspecified: Secondary | ICD-10-CM | POA: Diagnosis not present

## 2021-02-19 DIAGNOSIS — Z79899 Other long term (current) drug therapy: Secondary | ICD-10-CM | POA: Diagnosis not present

## 2021-02-20 DIAGNOSIS — R69 Illness, unspecified: Secondary | ICD-10-CM | POA: Diagnosis not present

## 2021-02-21 DIAGNOSIS — R69 Illness, unspecified: Secondary | ICD-10-CM | POA: Diagnosis not present

## 2021-02-23 DIAGNOSIS — R69 Illness, unspecified: Secondary | ICD-10-CM | POA: Diagnosis not present

## 2021-02-26 DIAGNOSIS — R69 Illness, unspecified: Secondary | ICD-10-CM | POA: Diagnosis not present

## 2021-02-27 DIAGNOSIS — R69 Illness, unspecified: Secondary | ICD-10-CM | POA: Diagnosis not present

## 2021-02-28 DIAGNOSIS — R69 Illness, unspecified: Secondary | ICD-10-CM | POA: Diagnosis not present

## 2021-03-02 DIAGNOSIS — R69 Illness, unspecified: Secondary | ICD-10-CM | POA: Diagnosis not present

## 2021-03-05 DIAGNOSIS — R69 Illness, unspecified: Secondary | ICD-10-CM | POA: Diagnosis not present

## 2021-03-06 DIAGNOSIS — R69 Illness, unspecified: Secondary | ICD-10-CM | POA: Diagnosis not present

## 2021-03-07 DIAGNOSIS — R69 Illness, unspecified: Secondary | ICD-10-CM | POA: Diagnosis not present

## 2021-03-08 ENCOUNTER — Telehealth: Payer: Self-pay | Admitting: Pharmacist

## 2021-03-08 NOTE — Chronic Care Management (AMB) (Addendum)
    Chronic Care Management Pharmacy Assistant   Name: Charles Villa  MRN: [id number] DOB: [birth year]  Reason for Encounter: General Adherence Call    Recent office visits:  02/06/2021 [facility] (PCP) Marin Olp, MD; chronic follow up, no medication changes indicated.  Recent consult visits:  None  Hospital visits:  None in previous 6 months  Medications: Outpatient Encounter Medications as of 03/08/2021  Medication Sig   aspirin EC 81 MG tablet Take 81 mg by mouth daily.   bisoprolol-hydrochlorothiazide (ZIAC) 2.5-6.25 MG tablet TAKE 1 TABLET BY MOUTH EVERY DAY   clobetasol (TEMOVATE) 0.05 % external solution Apply 1 application topically at bedtime as needed (scalp).    clobetasol cream (TEMOVATE) [id number] % Apply 1 application topically 2 (two) times daily as needed (abdomen).    clotrimazole-betamethasone (LOTRISONE) cream Apply 1 application topically 2 (two) times daily as needed. psoriass   desonide (DESONATE) 0.05 % gel Apply 1 application topically 2 (two) times daily as needed. Skin lesions   fluocinolone (VANOS) 0.01 % cream Apply 1 application topically daily as needed.    hydrocortisone (ANUSOL-HC) 2.5 % rectal cream Place 1 application rectally 2 (two) times daily as needed for hemorrhoids or anal itching.   hydrocortisone 2.5 % cream Apply 1 application topically 2 (two) times daily as needed (face).    icosapent Ethyl (VASCEPA) 1 g capsule TAKE 2 CAPSULES BY MOUTH TWO TIMES A DAY   pantoprazole (PROTONIX) 40 MG tablet TAKE 1 TABLET BY MOUTH EVERY DAY (Patient taking differently: every other day.)   rosuvastatin (CRESTOR) 40 MG tablet TAKE 1 TABLET BY MOUTH EVERY DAY   silver sulfADIAZINE (SILVADENE) 1 % cream Apply 1 application topically 2 (two) times daily as needed (groin).    No facility-administered encounter medications on file as of 03/08/2021.    Patient Questions: Have you had any problems recently with your health? Patient states he has not had any  problems recently with his health.  Have you had any problems with your pharmacy? Patient states he has not had any problems recently with his pharmacy.  What issues or side effects are you having with your medications? Patient states he is not having any issues or side effects with any of his medications.  What would you like me to pass along to Leata Mouse, CPP for him to help you with?  Patient did not have anything for me to pass along at this time.  What can we do to take care of you better? Patient did not have any suggestions.  Patient rescheduled his missed appointment with clinical pharmacist for 04/17/2021 at 10:00. He would like to speak with the pharmacist about a [employer] grant as previously discussed with Madelin Rear, CPP.   Future Appointments  Date Time Provider [place]  04/17/2021 10:00 AM [facility] CCM PHARMACIST [facility]  08/15/2021 10:40 AM Marin Olp, MD [facility]    Star Rating Drugs: Rosuvastatin 40 mg last filled 12/20/2020 90 DS  April D Calhoun, [place] Pharmacist Assistant [phone]   10 minutes spent in review, coordination, and documentation.  Reviewed by: Beverly Milch, PharmD Clinical Pharmacist [phone]

## 2021-03-09 DIAGNOSIS — R69 Illness, unspecified: Secondary | ICD-10-CM | POA: Diagnosis not present

## 2021-03-12 DIAGNOSIS — R69 Illness, unspecified: Secondary | ICD-10-CM | POA: Diagnosis not present

## 2021-03-13 DIAGNOSIS — R69 Illness, unspecified: Secondary | ICD-10-CM | POA: Diagnosis not present

## 2021-03-14 DIAGNOSIS — R69 Illness, unspecified: Secondary | ICD-10-CM | POA: Diagnosis not present

## 2021-03-16 DIAGNOSIS — R69 Illness, unspecified: Secondary | ICD-10-CM | POA: Diagnosis not present

## 2021-03-16 DIAGNOSIS — F1020 Alcohol dependence, uncomplicated: Secondary | ICD-10-CM | POA: Diagnosis not present

## 2021-03-19 DIAGNOSIS — R69 Illness, unspecified: Secondary | ICD-10-CM | POA: Diagnosis not present

## 2021-03-20 DIAGNOSIS — R69 Illness, unspecified: Secondary | ICD-10-CM | POA: Diagnosis not present

## 2021-03-21 DIAGNOSIS — Z20822 Contact with and (suspected) exposure to covid-19: Secondary | ICD-10-CM | POA: Diagnosis not present

## 2021-03-21 DIAGNOSIS — R69 Illness, unspecified: Secondary | ICD-10-CM | POA: Diagnosis not present

## 2021-03-22 ENCOUNTER — Other Ambulatory Visit: Payer: Self-pay | Admitting: Family Medicine

## 2021-03-23 DIAGNOSIS — R69 Illness, unspecified: Secondary | ICD-10-CM | POA: Diagnosis not present

## 2021-03-26 DIAGNOSIS — R69 Illness, unspecified: Secondary | ICD-10-CM | POA: Diagnosis not present

## 2021-03-27 DIAGNOSIS — R69 Illness, unspecified: Secondary | ICD-10-CM | POA: Diagnosis not present

## 2021-03-28 DIAGNOSIS — R69 Illness, unspecified: Secondary | ICD-10-CM | POA: Diagnosis not present

## 2021-03-30 DIAGNOSIS — F1020 Alcohol dependence, uncomplicated: Secondary | ICD-10-CM | POA: Diagnosis not present

## 2021-03-30 DIAGNOSIS — R69 Illness, unspecified: Secondary | ICD-10-CM | POA: Diagnosis not present

## 2021-04-11 DIAGNOSIS — H5212 Myopia, left eye: Secondary | ICD-10-CM | POA: Diagnosis not present

## 2021-04-11 DIAGNOSIS — H35033 Hypertensive retinopathy, bilateral: Secondary | ICD-10-CM | POA: Diagnosis not present

## 2021-04-11 DIAGNOSIS — H2513 Age-related nuclear cataract, bilateral: Secondary | ICD-10-CM | POA: Diagnosis not present

## 2021-04-11 DIAGNOSIS — I10 Essential (primary) hypertension: Secondary | ICD-10-CM | POA: Diagnosis not present

## 2021-04-17 ENCOUNTER — Ambulatory Visit (INDEPENDENT_AMBULATORY_CARE_PROVIDER_SITE_OTHER): Payer: Medicare HMO | Admitting: Pharmacist

## 2021-04-17 DIAGNOSIS — E785 Hyperlipidemia, unspecified: Secondary | ICD-10-CM

## 2021-04-17 DIAGNOSIS — I10 Essential (primary) hypertension: Secondary | ICD-10-CM

## 2021-04-17 NOTE — Patient Instructions (Addendum)
Visit Information   Goals Addressed             This Visit's Progress    Track and Manage My Blood Pressure-Hypertension       Timeframe:  Long-Range Goal Priority:  High Start Date: 04/17/21                            Expected End Date:  10/15/21                     Follow Up Date 07/17/21    - check blood pressure weekly    Why is this important?   You won't feel high blood pressure, but it can still hurt your blood vessels.  High blood pressure can cause heart or kidney problems. It can also cause a stroke.  Making lifestyle changes like losing a little weight or eating less salt will help.  Checking your blood pressure at home and at different times of the day can help to control blood pressure.  If the doctor prescribes medicine remember to take it the way the doctor ordered.  Call the office if you cannot afford the medicine or if there are questions about it.     Notes:        Patient Care Plan: General Pharmacy (Adult)     Problem Identified: HTN, CAD, HLD,   Priority: High  Onset Date: 04/17/2021     Long-Range Goal: Patient-Specific Goal   Start Date: 04/17/2021  Expected End Date: 10/15/2021  This Visit's Progress: On track  Priority: High  Note:   Current Barriers:  Unable to independently afford treatment regimen Does not adhere to prescribed medication regimen  Pharmacist Clinical Goal(s):  Patient will verbalize ability to afford treatment regimen maintain control of LDL as evidenced by labs  through collaboration with PharmD and provider.   Interventions: 1:1 collaboration with Marin Olp, MD regarding development and update of comprehensive plan of care as evidenced by provider attestation and co-signature Inter-disciplinary care team collaboration (see longitudinal plan of care) Comprehensive medication review performed; medication list updated in electronic medical record  Hypertension (BP goal <130/80) -Controlled -Current  treatment: Bisoprolol/HCTZ 2.5-6.25mg  daily -Medications previously tried: none noted  -Current home readings: does not check often, no logs but has been "good" when he does check -Denies hypotensive/hypertensive symptoms -Educated on BP goals and benefits of medications for prevention of heart attack, stroke and kidney damage; Importance of home blood pressure monitoring; Symptoms of hypotension and importance of maintaining adequate hydration; -Counseled to monitor BP at home periodically, document, and provide log at future appointments -Recommended to continue current medication  Hyperlipidemia/CAD: (LDL goal < 70) -Controlled -Current treatment: Rosuvastatin 40mg  daily Vascepa 1g 2 capsules po bid - only taking two capsules daily due to $$ -Medications previously tried: simvastatin  -Last LDL well controlled at last labs -Educated on Cholesterol goals;  Importance of limiting foods high in cholesterol; [place] - application is currently closed, however given website and disease fund name so that he can also check periodically to see if the fund is open. -Recommended to continue current medication Continue to pursue financial assistance options.  Patient Goals/Self-Care Activities Patient will:  - take medications as prescribed check blood pressure periodically, document, and provide at future appointments collaborate with provider on medication access solutions  Follow Up Plan: The care management team will reach out to the patient again over the next 180 days.  Patient verbalizes understanding of instructions provided today and agrees to view in [place].  Telephone follow up appointment with pharmacy team member scheduled for: 6 months  Edythe Clarity, [place]

## 2021-04-17 NOTE — Progress Notes (Signed)
Chronic Care Management Pharmacy Note  04/17/2021 Name:  Charles Villa MRN:  [id number] DOB:  1945-12-20  Summary: PharmD follow up to assess financial needs with [facility].  [place] closed at this time, will continue to follow.  Patient taking only two caps daily now to make them last.  Recommendations/Changes made from today's visit: None at this time - continue to watch [facility]  Plan: FU 6 months   Subjective: Charles Villa is an 75 y.o. year old male who is a primary patient of Hunter, Brayton Mars, MD.  The CCM team was consulted for assistance with disease management and care coordination needs.    Engaged with patient by telephone for follow up visit in response to provider referral for pharmacy case management and/or care coordination services.   Consent to Services:  The patient was given the following information about Chronic Care Management services today, agreed to services, and gave verbal consent: 1. CCM service includes personalized support from designated clinical staff supervised by the primary care provider, including individualized plan of care and coordination with other care providers 2. 24/7 contact phone numbers for assistance for urgent and routine care needs. 3. Service will only be billed when office clinical staff spend 20 minutes or more in a month to coordinate care. 4. Only one practitioner may furnish and bill the service in a calendar month. 5.The patient may stop CCM services at any time (effective at the end of the month) by phone call to the office staff. 6. The patient will be responsible for cost sharing (co-pay) of up to 20% of the service fee (after annual deductible is met). Patient agreed to services and consent obtained.  Patient Care Team: Marin Olp, MD as PCP - General (Family Medicine) Debara Pickett Nadean Corwin, MD as Consulting Physician (Cardiology) Carmela Rima, DMD as Consulting Physician (Dentistry) Edythe Clarity, [facility] (Pharmacist)  Recent office visits:  02/06/2021 OV (PCP) Marin Olp, MD; chronic follow up, no medication changes indicated.   Recent consult visits:  None   Hospital visits:  None in previous 6 months   Objective:  Lab Results  Component Value Date   CREATININE 1.13 02/06/2021   BUN 16 02/06/2021   GFR 64.60 02/06/2021   GFRNONAA 67 03/03/2017   GFRAA 77 03/03/2017   NA 136 02/06/2021   K 3.6 02/06/2021   CALCIUM 9.4 02/06/2021   CO2 24 02/06/2021   GLUCOSE 105 (H) 02/06/2021    Lab Results  Component Value Date/Time   GFR 64.60 02/06/2021 10:45 AM   GFR 73.30 08/08/2020 10:29 AM    Last diabetic Eye exam: No results found for: HMDIABEYEEXA  Last diabetic Foot exam: No results found for: HMDIABFOOTEX   Lab Results  Component Value Date   CHOL 164 08/08/2020   HDL 65.40 08/08/2020   LDLCALC 74 08/08/2020   LDLDIRECT 76.0 02/06/2021   TRIG 121.0 08/08/2020   CHOLHDL 3 08/08/2020    Hepatic Function Latest Ref Rng & Units 02/06/2021 08/08/2020 11/15/2019  Total Protein 6.0 - 8.3 g/dL 6.9 7.2 6.7  Albumin 3.5 - 5.2 g/dL 4.0 4.1 4.0  AST 0 - 37 U/L 26 29 36  ALT 0 - 53 U/L _0 Alk Phosphatase 39 - 117 U/L 34(L) 36(L) 40  Total Bilirubin 0.2 - 1.2 mg/dL 0.5 0.6 0.9  Bilirubin, Direct 0.0 - 0.3 mg/dL - - -    Lab Results  Component Value Date/Time   TSH 1.300 02/20/2017  12:00 AM   TSH 1.39 06/17/2016 10:11 AM    CBC Latest Ref Rng & Units 02/06/2021 08/08/2020 11/15/2019  WBC 4.0 - 10.5 K/uL 5.2 6.2 5.5  Hemoglobin 13.0 - 17.0 g/dL 15.6 15.9 15.4  Hematocrit 39.0 - 52.0 % 45.1 46.7 45.5  Platelets 150.0 - 400.0 K/uL 150.0 170.0 198    No results found for: VD25OH  Clinical ASCVD: Yes  The 10-year ASCVD risk score (Arnett DK, et al., 2019) is: 14.3%   Values used to calculate the score:     Age: 69 years     Sex: Male     Is Non-Hispanic African American: No     Diabetic: No     Tobacco smoker: No     Systolic Blood Pressure: 100  mmHg     Is BP treated: Yes     HDL Cholesterol: 65.4 mg/dL     Total Cholesterol: 164 mg/dL    Depression screen [facility] 2/9 02/06/2021 08/08/2020 02/11/2020  Decreased Interest 0 0 0  Down, Depressed, Hopeless 0 0 0  PHQ - 2 Score 0 0 0  Altered sleeping - - -  Tired, decreased energy - - -  Change in appetite - - -  Feeling bad or failure about yourself  - - -  Trouble concentrating - - -  Moving slowly or fidgety/restless - - -  Suicidal thoughts - - -  PHQ-9 Score - - -  Difficult doing work/chores - - -      Social History   Tobacco Use  Smoking Status Never  Smokeless Tobacco Never   BP Readings from Last 3 Encounters:  02/06/21 100/62  10/27/20 109/63  08/08/20 104/64   Pulse Readings from Last 3 Encounters:  02/06/21 (!) 58  10/27/20 71  08/08/20 60   Wt Readings from Last 3 Encounters:  02/06/21 227 lb 9.6 oz (103.2 kg)  10/27/20 232 lb 9.6 oz (105.5 kg)  08/08/20 232 lb 9.6 oz (105.5 kg)   BMI Readings from Last 3 Encounters:  02/06/21 31.74 kg/m  10/27/20 32.44 kg/m  08/08/20 32.44 kg/m    Assessment/Interventions: Review of patient past medical history, allergies, medications, health status, including review of consultants reports, laboratory and other test data, was performed as part of comprehensive evaluation and provision of chronic care management services.   SDOH:  (Social Determinants of Health) assessments and interventions performed: Yes Financial Resource Strain: Not on file     SDOH Screenings   Alcohol Screen: Not on file  Depression (PHQ2-9): Low Risk    PHQ-2 Score: 0  Financial Resource Strain: Not on file  Food Insecurity: No Food Insecurity   Worried About Charity fundraiser in the Last Year: Never true   Ran Out of Food in the Last Year: Never true  Housing: Not on file  Physical Activity: Not on file  Social Connections: Not on file  Stress: Not on file  Tobacco Use: Low Risk    Smoking Tobacco Use: Never   Smokeless  Tobacco Use: Never  Transportation Needs: No Transportation Needs   Lack of Transportation (Medical): No   Lack of Transportation (Non-Medical): No    CCM Care Plan  No Known Allergies  Medications Reviewed Today     Reviewed by Edythe Clarity, [facility] (Pharmacist) on 04/17/21 at 1013  Med List Status: <None>   Medication Order Taking? Sig Documenting Provider Last Dose Status Informant  aspirin EC 81 MG tablet [id number] Yes Take 81 mg by mouth  daily. [provider] Taking Active   bisoprolol-hydrochlorothiazide [facility]) 2.5-6.25 MG tablet [id number] Yes TAKE 1 TABLET BY MOUTH EVERY DAY Marin Olp, MD Taking Active   clobetasol (TEMOVATE) 0.05 % external solution [id number] Yes Apply 1 application topically at bedtime as needed (scalp).  [provider] Taking Active   clobetasol cream (TEMOVATE) 0.05 % [id number] Yes Apply 1 application topically 2 (two) times daily as needed (abdomen).  [provider] Taking Active   clotrimazole-betamethasone (LOTRISONE) cream [id number] Yes Apply 1 application topically 2 (two) times daily as needed. psoriass [provider] Taking Active Self  desonide (DESONATE) 0.05 % gel [id number] Yes Apply 1 application topically 2 (two) times daily as needed. Skin lesions [provider] Taking Active Self  fluocinolone (VANOS) 0.01 % cream [id number] Yes Apply 1 application topically daily as needed.  [provider] Taking Active Self  hydrocortisone (ANUSOL-HC) 2.5 % rectal cream [id number] Yes Place 1 application rectally 2 (two) times daily as needed for hemorrhoids or anal itching. [provider] Taking Active Self  hydrocortisone 2.5 % cream [id number] Yes Apply 1 application topically 2 (two) times daily as needed (face).  [provider] Taking Active   icosapent Ethyl (VASCEPA) 1 g capsule [id number] Yes TAKE 2 CAPSULES BY MOUTH TWO TIMES A DAY [name], Nadean Corwin, MD Taking Active    pantoprazole (PROTONIX) 40 MG tablet [id number] Yes TAKE 1 TABLET BY MOUTH EVERY DAY  Patient taking differently: every other day.   Pixie Casino, MD Taking Active   rosuvastatin (CRESTOR) 40 MG tablet [id number] Yes TAKE 1 TABLET BY MOUTH EVERY DAY Marin Olp, MD Taking Active   silver sulfADIAZINE (SILVADENE) 1 % cream [id number] Yes Apply 1 application topically 2 (two) times daily as needed (groin).  [provider] Taking Active             Patient Active Problem List   Diagnosis Date Noted   History of adenomatous polyp of colon 11/02/2019   Senile purpura ([place]) 91/50/5697   Alcoholic peripheral neuropathy ([place]) 02/09/2019   Thiamine deficiency 04/28/2017   CAD S/P percutaneous coronary angioplasty 02/24/2017   S/P cardiac catheterization 02/24/2017   Alcoholism in recovery ([place]) 02/03/2017   Tremor 05/06/2016   OCD (obsessive compulsive disorder) 01/23/2016   Essential hypertension 03/11/2014   Anal fissure 08/17/2012   SQUAMOUS CELL CARCINOMA OF SKIN SITE UNSPECIFIED 08/24/2010   GERD 02/23/2007   Hyperlipidemia with target low density lipoprotein (LDL) cholesterol less than 70 mg/dL 02/10/2007   PSORIASIS 02/10/2007   BACK PAIN, CHRONIC 02/10/2007    Immunization History  Administered Date(s) Administered   DTP 08/10/2007   Fluad Quad(high Dose 65+) 05/06/2019   Influenza Split 06/08/2012   Influenza Whole 08/21/2009   Influenza, High Dose Seasonal PF 05/12/2013, 05/06/2016, 05/05/2018   Influenza, Seasonal, Injecte, Preservative Fre 05/23/2013   Influenza,inj,Quad PF,6+ Mos 07/12/2020   Influenza-Unspecified 05/01/2015   PFIZER Comirnaty(Gray Top)Covid-19 Tri-Sucrose Vaccine 11/30/2020   PFIZER(Purple Top)SARS-COV-2 Vaccination 09/03/2019, 09/24/2019   Pneumococcal Conjugate-13 08/30/2013   Pneumococcal Polysaccharide-23 03/11/2014   Td 07/29/1998, 08/15/2008   Tdap 10/07/2016    Conditions to be addressed/monitored:  HTN, CAD, HLD,    Care Plan : General Pharmacy (Adult)  Updates made by Edythe Clarity, RPH since 04/17/2021 12:00 AM     Problem: HTN, CAD, HLD,   Priority: High  Onset Date: 04/17/2021     Long-Range Goal: Patient-Specific Goal   Start Date: 04/17/2021  Expected End Date: 10/15/2021  This  Visit's Progress: On track  Priority: High  Note:   Current Barriers:  Unable to independently afford treatment regimen Does not adhere to prescribed medication regimen  Pharmacist Clinical Goal(s):  Patient will verbalize ability to afford treatment regimen maintain control of LDL as evidenced by labs  through collaboration with PharmD and provider.   Interventions: 1:1 collaboration with Marin Olp, MD regarding development and update of comprehensive plan of care as evidenced by provider attestation and co-signature Inter-disciplinary care team collaboration (see longitudinal plan of care) Comprehensive medication review performed; medication list updated in electronic medical record  Hypertension (BP goal <130/80) -Controlled -Current treatment: Bisoprolol/HCTZ 2.5-6.25mg daily -Medications previously tried: none noted  -Current home readings: does not check often, no logs but has been "good" when he does check -Denies hypotensive/hypertensive symptoms -Educated on BP goals and benefits of medications for prevention of heart attack, stroke and kidney damage; Importance of home blood pressure monitoring; Symptoms of hypotension and importance of maintaining adequate hydration; -Counseled to monitor BP at home periodically, document, and provide log at future appointments -Recommended to continue current medication  Hyperlipidemia/CAD: (LDL goal < 70) -Controlled -Current treatment: Rosuvastatin 28m daily Vascepa 1g 2 capsules po bid - only taking two capsules daily due to $$ -Medications previously tried: simvastatin  -Last LDL well controlled at last labs -Educated on Cholesterol  goals;  Importance of limiting foods high in cholesterol; [place]- application is currently closed, however given website and disease fund name so that he can also check periodically to see if the fund is open. -Recommended to continue current medication Continue to pursue financial assistance options.  Patient Goals/Self-Care Activities Patient will:  - take medications as prescribed check blood pressure periodically, document, and provide at future appointments collaborate with provider on medication access solutions  Follow Up Plan: The care management team will reach out to the patient again over the next 180 days.         Medication Assistance: None required.  Patient affirms current coverage meets needs.  Compliance/Adherence/Medication fill history: Care Gaps: Patient is Due for his AWV  Star-Rating Drugs: Rosuvastatin 412m08/25/22 90ds  Patient's preferred pharmacy is:  [id number] [place] - [address]: [phone]: [phone] [id number] [address] [place]: [phone]: [phone] discussed: Benefits of medication synchronization, packaging and delivery as well as enhanced pharmacist oversight with [facility]. Patient decided to: Continue current medication management strategy  Care Plan and Follow Up Patient Decision:  Patient agrees to Care Plan and Follow-up.  Plan: The care management team will reach out to the patient again over the next 180 days.  ChrBeverly MilchharmD Clinical Pharmacist [phone]

## 2021-04-27 DIAGNOSIS — I10 Essential (primary) hypertension: Secondary | ICD-10-CM

## 2021-04-27 DIAGNOSIS — E785 Hyperlipidemia, unspecified: Secondary | ICD-10-CM | POA: Diagnosis not present

## 2021-04-28 ENCOUNTER — Other Ambulatory Visit: Payer: Self-pay | Admitting: Family Medicine

## 2021-05-03 DIAGNOSIS — F1020 Alcohol dependence, uncomplicated: Secondary | ICD-10-CM | POA: Diagnosis not present

## 2021-05-03 DIAGNOSIS — R69 Illness, unspecified: Secondary | ICD-10-CM | POA: Diagnosis not present

## 2021-05-03 DIAGNOSIS — Z20822 Contact with and (suspected) exposure to covid-19: Secondary | ICD-10-CM | POA: Diagnosis not present

## 2021-05-09 ENCOUNTER — Encounter: Payer: Self-pay | Admitting: Family Medicine

## 2021-08-08 IMAGING — DX DG HAND COMPLETE 3+V*R*
3 series · 3 of 3 positions shown · non-contrast
Comparison: None.

CLINICAL DATA: Pain at the base of the thumb for 2 weeks.

EXAM:
RIGHT HAND - COMPLETE 3+ VIEW

[hand ap]
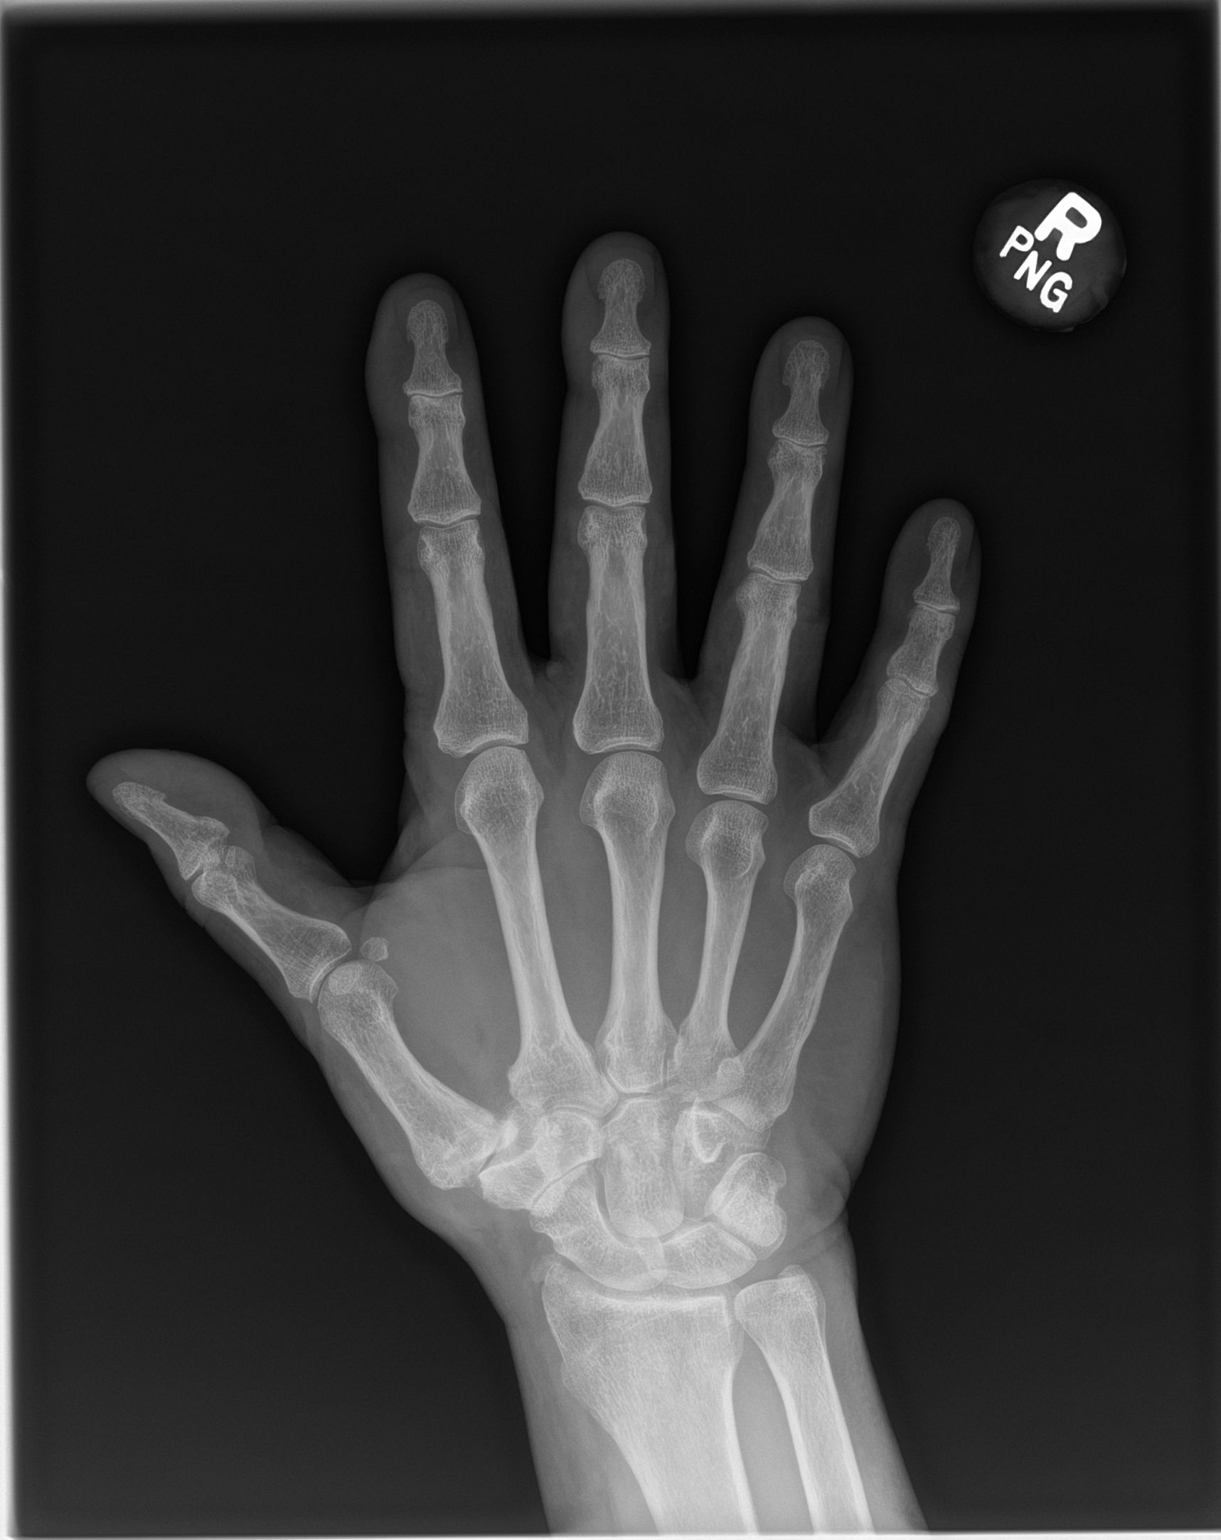

[hand obl]
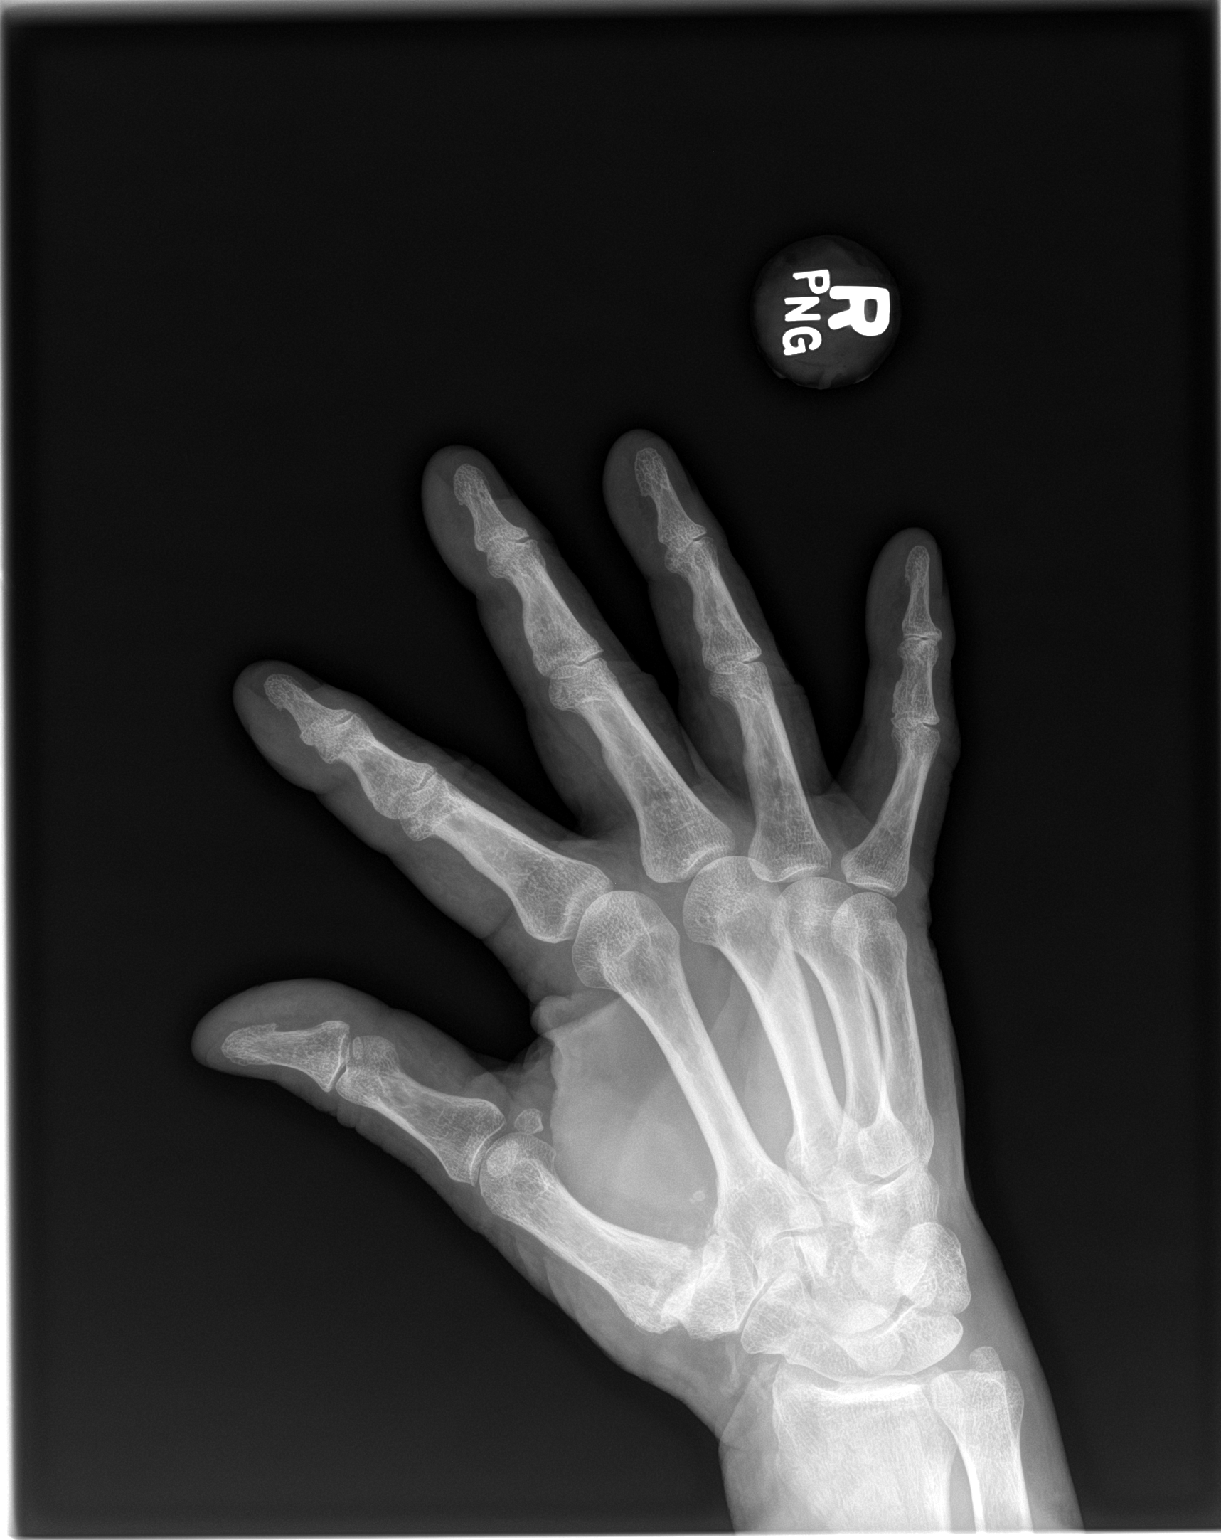

[hand lat]
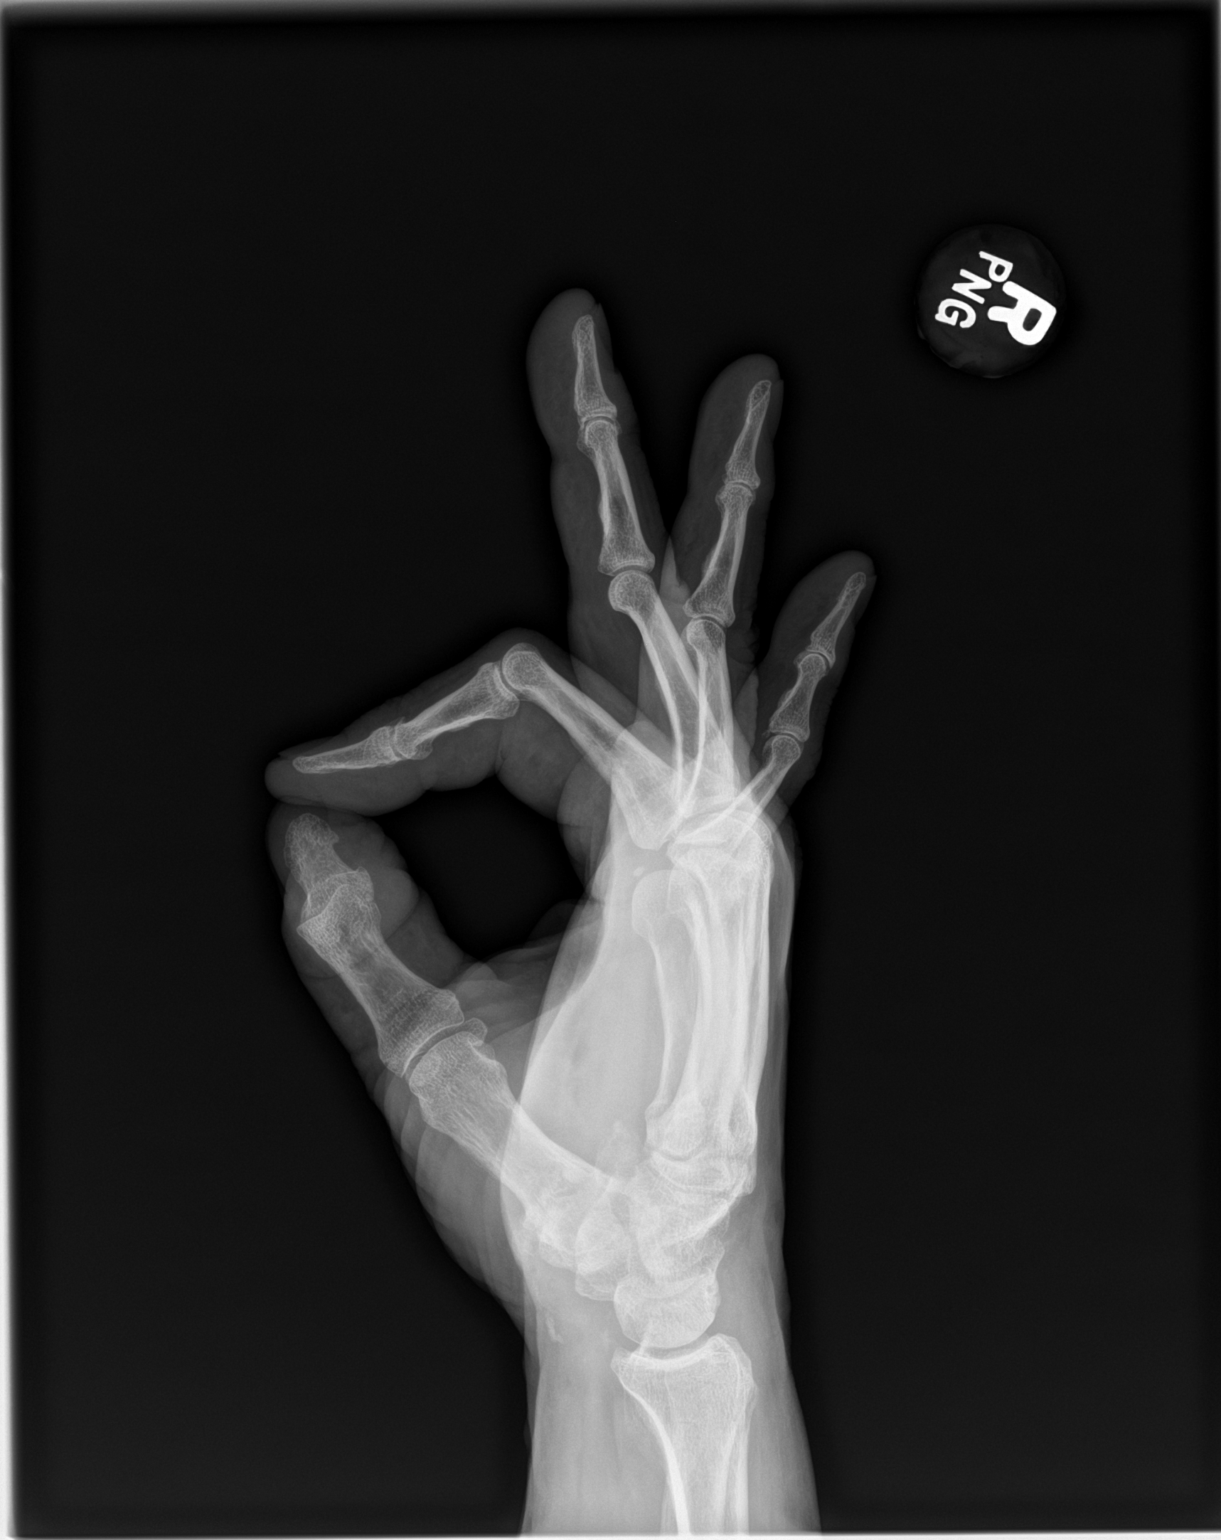

[3 of 3 positions shown; findings below may reference images not displayed]

FINDINGS: Degenerative joint changes with narrowed joint space osteophyte
formation at the first metacarpal-carpal joint are noted. There is
no acute fracture identified.
IMPRESSION: Osteoarthritic changes at the first metacarpal-carpal joint.

## 2021-08-10 NOTE — Progress Notes (Signed)
Phone: [phone]   Subjective:  Patient presents today for their annual physical. Chief complaint-noted.   See problem oriented charting- ROS- full  review of systems was completed and negative  except for: post nasal drip and joint pain   The following were reviewed and entered/updated in epic: Past Medical History:  Diagnosis Date   Adenomatous colon polyp 11/02/2019   Anal fissure 08/17/2012   Really more gluteal crease irritation- protosol hc 2.5% prn    BACK PAIN, CHRONIC 02/10/2007   CAD (coronary artery disease), native coronary artery    02/24/17-PCI/DES to mLcx, normal EF.   Diverticulosis of colon    2007   GERD 02/23/2007   GERD (gastroesophageal reflux disease)    Hyperlipidemia    Hypertension    OCD (obsessive compulsive disorder) 01/23/2016   Ocd/hoarding issues- psychiatristis- [place] started on zoloft 100mg --> paxil   Personal history of colonic polyps 03/14/2014   08/2005. Benign. 10 year repeat. 2010 colonoscopy in record was entered erroneously.     PSORIASIS 02/10/2007   Topical therapy.      Scalp psoriasis    SQUAMOUS CELL CARCINOMA OF SKIN SITE UNSPECIFIED 08/24/2010   Annotation: face    Patient Active Problem List   Diagnosis Date Noted   CAD S/P percutaneous coronary angioplasty 02/24/2017    Priority: High   Alcoholism in recovery ([place]) 02/03/2017    Priority: High   History of adenomatous polyp of colon 11/02/2019    Priority: Medium    Alcoholic peripheral neuropathy ([place]) 02/09/2019    Priority: Medium    Thiamine deficiency 04/28/2017    Priority: Medium    Tremor 05/06/2016    Priority: Medium    OCD (obsessive compulsive disorder) 01/23/2016    Priority: Medium    Essential hypertension 03/11/2014    Priority: Medium    Hyperlipidemia with target low density lipoprotein (LDL) cholesterol less than 70 mg/dL 02/10/2007    Priority: Medium    PSORIASIS 02/10/2007    Priority: Medium    Senile purpura ([place]) 05/06/2019    Priority: Low    S/P cardiac catheterization 02/24/2017    Priority: Low   Anal fissure 08/17/2012    Priority: Low   SQUAMOUS CELL CARCINOMA OF SKIN SITE UNSPECIFIED 08/24/2010    Priority: Low   GERD 02/23/2007    Priority: Low   BACK PAIN, CHRONIC 02/10/2007    Priority: Low   Past Surgical History:  Procedure Laterality Date   COLONOSCOPY     COLONOSCOPY W/ POLYPECTOMY     2007 [name]   CORONARY STENT INTERVENTION N/A 02/24/2017   Procedure: Coronary Stent Intervention;  Surgeon: Leonie Man, MD;  Location: [facility];  Service: Cardiovascular;  Laterality: N/A;   LEFT HEART CATH AND CORONARY ANGIOGRAPHY N/A 02/24/2017   Procedure: Left Heart Cath and Coronary Angiography;  Surgeon: Leonie Man, MD;  Location: [facility];  Service: Cardiovascular;  Laterality: N/A;   POLYPECTOMY     SKIN CANCER EXCISION     squamous cell CA - nose   TONSILLECTOMY      Family History  Problem Relation Age of Onset   Lung cancer Father    Stroke Mother    Hypertension Mother    Colon polyps Neg Hx    Colon cancer Neg Hx    Esophageal cancer Neg Hx    Rectal cancer Neg Hx    Stomach cancer Neg Hx     Medications- reviewed and updated Current  Outpatient Medications  Medication Sig Dispense Refill   aspirin EC 81 MG tablet Take 81 mg by mouth daily.     bisoprolol-hydrochlorothiazide (ZIAC) 2.5-6.25 MG tablet TAKE 1 TABLET BY MOUTH EVERY DAY 90 tablet 3   clobetasol (TEMOVATE) 0.05 % external solution Apply 1 application topically at bedtime as needed (scalp).   5   clobetasol cream (TEMOVATE) 7.98 % Apply 1 application topically 2 (two) times daily as needed (abdomen).   3   clotrimazole-betamethasone (LOTRISONE) cream Apply 1 application topically 2 (two) times daily as needed. psoriass     desonide (DESONATE) 0.05 % gel Apply 1 application topically 2 (two) times daily as needed. Skin lesions     fluocinolone (VANOS) 0.01 % cream Apply 1 application topically daily as  needed.      hydrocortisone (ANUSOL-HC) 2.5 % rectal cream Place 1 application rectally 2 (two) times daily as needed for hemorrhoids or anal itching.     hydrocortisone 2.5 % cream Apply 1 application topically 2 (two) times daily as needed (face).   5   icosapent Ethyl (VASCEPA) 1 g capsule TAKE 2 CAPSULES BY MOUTH TWO TIMES A DAY 360 capsule 1   pantoprazole (PROTONIX) 40 MG tablet TAKE 1 TABLET BY MOUTH EVERY DAY (Patient taking differently: every other day.) 90 tablet 3   rosuvastatin (CRESTOR) 40 MG tablet TAKE 1 TABLET BY MOUTH EVERY DAY 90 tablet 1   silver sulfADIAZINE (SILVADENE) 1 % cream Apply 1 application topically 2 (two) times daily as needed (groin).   5   No current facility-administered medications for this visit.    Allergies-reviewed and updated No Known Allergies  Social History   Social History Narrative   Married 42 years in 2015. 2 kids, 1 grandchild that is 36.74 years old.       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      Hobbies: ride bike, ski in winter    Objective  Objective:  BP 100/64    Pulse 73    Temp 98 F (36.7 C)    Ht 5\' 11"  (1.803 m)    Wt 233 lb 12.8 oz (106.1 kg)    SpO2 96%    BMI 32.61 kg/m  Gen: NAD, resting comfortably HEENT: Mucous membranes are moist. Oropharynx normal Neck: no thyromegaly CV: RRR no murmurs rubs or gallops Lungs: CTAB no crackles, wheeze, rhonchi Abdomen: soft/nontender/nondistended/normal bowel sounds. No rebound or guarding.  Diastases recti noted Ext: no edema Skin: warm, dry Neuro: grossly normal, moves all extremities, PERRLA   Assessment and Plan  76 y.o. male presenting for annual physical.  Health Maintenance counseling: 1. Anticipatory guidance: Patient counseled regarding regular dental exams -q6 months, eye exams -yearly,  avoiding smoking and second hand smoke , limiting alcohol to 2 beverages per day - about 3-4 per day- have advised cutting back 2 a day max and ideally eliminate-  [facility]- no cost- not AA specifically.  No illicit drugs  2. Risk factor reduction:  Advised patient of need for regular exercise and diet rich and fruits and vegetables to reduce risk of heart attack and stroke.  Exercise- self described "lousy"- discussed goal 150 mins a week- considering walking or getting bike back in shape.  Diet/weight management-within 1 lb of last physical- cutting alcohol would help and reducing portion sizes.  Wt Readings from Last 3 Encounters:  08/15/21 233 lb 12.8 oz (106.1 kg)  02/06/21 227 lb 9.6 oz (  103.2 kg)  10/27/20 232 lb 9.6 oz (105.5 kg)  3. Immunizations/screenings/ancillary studies DISCUSSED:  -COVID booster vaccine #4- consider bivalent at pharmacy- [facility] -Flu vaccine (last one 06/2020) - declines for the season -Shingles vaccine #1 - discussed at pharmacy Immunization History  Administered Date(s) Administered   DTP 08/10/2007   Fluad Quad(high Dose 65+) 05/06/2019   Influenza Split 06/08/2012   Influenza Whole 08/21/2009   Influenza, High Dose Seasonal PF 05/12/2013, 05/06/2016, 05/05/2018   Influenza, Seasonal, Injecte, Preservative Fre 05/23/2013   Influenza,inj,Quad PF,6+ Mos 07/12/2020   Influenza-Unspecified 05/01/2015   PFIZER Comirnaty(Gray Top)Covid-19 Tri-Sucrose Vaccine 11/30/2020   PFIZER(Purple Top)SARS-COV-2 Vaccination 09/03/2019, 09/24/2019   Pneumococcal Conjugate-13 08/30/2013   Pneumococcal Polysaccharide-23 03/11/2014   Td 07/29/1998, 08/15/2008   Tdap 10/07/2016   4. Prostate cancer screening- low risk prior PSA trend-we have discontinued checks. Nocturia stable 3x a night or less Lab Results  Component Value Date   PSA 0.44 05/05/2018   PSA 0.40 08/04/2015   PSA 0.44 03/07/2014   5. Colon cancer screening - colonoscopy 10/21/19 with 3 year repeat planned 6. Skin cancer screening- no dermatologist. advised regular sunscreen use. Denies worrisome, changing, or new  skin lesions.  7. Smoking associated screening (lung cancer screening, AAA screen 65-75, UA)- NEVER smoker 8. STD screening - no STD screening as only active with wife  Status of chronic or acute concerns   #CAD-follows with Dr. Debara Pickett. Status post drug-eluting stent 2018 #hyperlipidemia-LDL goal under 70 S: Medication: Rosuvastatin 40 mg, Vascepa 2 g twice daily, aspirin 81 mg daily In regards to CAD potential symptoms -no chest pain or shortness of breath  Lab Results  Component Value Date   CHOL 164 08/08/2020   HDL 65.40 08/08/2020   LDLCALC 74 08/08/2020   LDLDIRECT 76.0 02/06/2021   TRIG 121.0 08/08/2020   CHOLHDL 3 08/08/2020   A/P: CAD asymptomatic.  Hyperlipidemia slightly above goal last check-update lipid panel and hope for LDL under 70  #hypertension S: medication: Ziac 2.5-6.25Mg  BP Readings from Last 3 Encounters:  08/15/21 100/64  02/06/21 100/62  10/27/20 109/63  A/P:  Controlled but discussed I thought we could actually potentially stop medication- will start by trying half tablet and then update me in 2 weeks with home readings  # GERD/high risk medication use S:Medication: Protonix 40Mg  3-4 days a week. B12 levels low normal in 2022 B12 levels related to PPI use: Lab Results  Component Value Date   VITAMINB12 381 08/08/2020  A/P: Good control of reflux with even 3-4x a week- continue current meds.  -taking b complex so suspect b12 ok but will check   #Alcoholic neuropathy/alcohol dependence S: Medication: Thiamine only through b complex. Naltrexone in the past through Dr. Clovis Pu was not helpful Symptoms: Numbness and tingling in feet- slightly better. Denied significant pain still.   Current alcohol intake: At last visit reported 2-3 beverages per day but back up to 2-3 per day A/P: neuropathy stable. Dependence poor control- advised full cessation.    #Macrocytosis-  B12 and folate levels had been normal, pathology smear review was normal-no further  work-up planned after 11/17/2019 unless worsened other cell lines.   -monitor CBC to make sure not worsening- likely related to alchohol   #senile purpura- noted. Stable. Check cbc at least annually.  Aspirin contributes   #Phlegm in the morning 2022- previously thought possibly related to allergies. Stubborn/persistent  #Joint pain/suspected osteoarthritis S: Medication: I recommended Voltaren gel up to 4 times a  day over ibuprofen due to CAD-can use very sparingly ibuprofen if needed - not using either lately for most part- sparing once a day Patient seen [facility] in the past for right knee. Also had received some pain in his fingers as well A/P: stable- tolerating without meds   Recommended follow up: Return in about 6 months (around 02/12/2022) for follow up- or sooner if needed. Future Appointments  Date Time Provider [place]  10/15/2021  3:45 PM [facility] PHARMACIST [facility]   Lab/Order associations:NOT fasting   ICD-10-CM   1. Preventative health care  Z00.00     2. Hyperlipidemia with target low density lipoprotein (LDL) cholesterol less than 70 mg/dL  E78.5 CBC with Differential/Platelet    Comprehensive metabolic panel    Lipid panel    3. CAD S/P percutaneous coronary angioplasty  I25.10    Z98.61     4. Essential hypertension  I10     5. Gastroesophageal reflux disease, unspecified whether esophagitis present  K21.9     6. Alcoholic peripheral neuropathy (HCC)  G62.1     7. Alcoholism in recovery [facility]) Chronic F10.21     8. Senile purpura (HCC) Chronic D69.2     9. High risk medication use  Z79.899 Vitamin B12      No orders of the defined types were placed in this encounter.   I,Jada Bradford,acting as a scribe for Garret Reddish, MD.,have documented all relevant documentation on the behalf of Garret Reddish, MD,as directed by  Garret Reddish, MD while in the presence of Garret Reddish, MD.   I, Garret Reddish, MD, have reviewed all  documentation for this visit. The documentation on 08/15/21 for the exam, diagnosis, procedures, and orders are all accurate and complete.   Return precautions advised.  Garret Reddish, MD

## 2021-08-14 ENCOUNTER — Encounter: Payer: Self-pay | Admitting: Internal Medicine

## 2021-08-14 MED ORDER — ICOSAPENT ETHYL 1 G PO CAPS: ORAL_CAPSULE | ORAL | 1 refills | Status: DC

## 2021-08-15 ENCOUNTER — Ambulatory Visit (INDEPENDENT_AMBULATORY_CARE_PROVIDER_SITE_OTHER): Payer: Medicare HMO | Admitting: Family Medicine

## 2021-08-15 ENCOUNTER — Encounter: Payer: Self-pay | Admitting: Family Medicine

## 2021-08-15 ENCOUNTER — Other Ambulatory Visit: Payer: Self-pay

## 2021-08-15 VITALS — BP 100/64 | HR 73 | Temp 98.0°F | Ht 71.0 in | Wt 233.8 lb

## 2021-08-15 DIAGNOSIS — I2510 Atherosclerotic heart disease of native coronary artery without angina pectoris: Secondary | ICD-10-CM

## 2021-08-15 DIAGNOSIS — Z9861 Coronary angioplasty status: Secondary | ICD-10-CM | POA: Diagnosis not present

## 2021-08-15 DIAGNOSIS — Z79899 Other long term (current) drug therapy: Secondary | ICD-10-CM | POA: Diagnosis not present

## 2021-08-15 DIAGNOSIS — D692 Other nonthrombocytopenic purpura: Secondary | ICD-10-CM

## 2021-08-15 DIAGNOSIS — I10 Essential (primary) hypertension: Secondary | ICD-10-CM | POA: Diagnosis not present

## 2021-08-15 DIAGNOSIS — Z0000 Encounter for general adult medical examination without abnormal findings: Secondary | ICD-10-CM

## 2021-08-15 DIAGNOSIS — E785 Hyperlipidemia, unspecified: Secondary | ICD-10-CM | POA: Diagnosis not present

## 2021-08-15 DIAGNOSIS — K219 Gastro-esophageal reflux disease without esophagitis: Secondary | ICD-10-CM

## 2021-08-15 DIAGNOSIS — R69 Illness, unspecified: Secondary | ICD-10-CM | POA: Diagnosis not present

## 2021-08-15 DIAGNOSIS — G621 Alcoholic polyneuropathy: Secondary | ICD-10-CM

## 2021-08-15 DIAGNOSIS — F1021 Alcohol dependence, in remission: Secondary | ICD-10-CM

## 2021-08-15 LAB — CBC WITH DIFFERENTIAL/PLATELET
Basophils Absolute: 0 10*3/uL (ref 0.0–0.1)
Basophils Relative: 0.4 % (ref 0.0–3.0)
Eosinophils Absolute: 0.1 10*3/uL (ref 0.0–0.7)
Eosinophils Relative: 1.7 % (ref 0.0–5.0)
HCT: 45.5 % (ref 39.0–52.0)
Hemoglobin: 15.3 g/dL (ref 13.0–17.0)
Lymphocytes Relative: 37.8 % (ref 12.0–46.0)
Lymphs Abs: 2.2 10*3/uL (ref 0.7–4.0)
MCHC: 33.6 g/dL (ref 30.0–36.0)
MCV: 102.5 fl — ABNORMAL HIGH (ref 78.0–100.0)
Monocytes Absolute: 0.6 10*3/uL (ref 0.1–1.0)
Monocytes Relative: 9.6 % (ref 3.0–12.0)
Neutro Abs: 3 10*3/uL (ref 1.4–7.7)
Neutrophils Relative %: 50.5 % (ref 43.0–77.0)
Platelets: 175 10*3/uL (ref 150.0–400.0)
RBC: 4.44 Mil/uL (ref 4.22–5.81)
RDW: 13.3 % (ref 11.5–15.5)
WBC: 5.9 10*3/uL (ref 4.0–10.5)

## 2021-08-15 LAB — LIPID PANEL
Cholesterol: 165 mg/dL (ref 0–200)
HDL: 58.2 mg/dL (ref >39.00)
LDL Cholesterol: 71 mg/dL (ref 0–99)
NonHDL: 106.86
Total CHOL/HDL Ratio: 3
Triglycerides: 177 mg/dL — ABNORMAL HIGH (ref 0.0–149.0)
VLDL: 35.4 mg/dL (ref 0.0–40.0)

## 2021-08-15 LAB — COMPREHENSIVE METABOLIC PANEL
ALT: 28 U/L (ref 0–53)
AST: 34 U/L (ref 0–37)
Albumin: 3.8 g/dL (ref 3.5–5.2)
Alkaline Phosphatase: 35 U/L — ABNORMAL LOW (ref 39–117)
BUN: 16 mg/dL (ref 6–23)
CO2: 24 mEq/L (ref 19–32)
Calcium: 8.9 mg/dL (ref 8.4–10.5)
Chloride: 100 mEq/L (ref 96–112)
Creatinine, Ser: 1.25 mg/dL (ref 0.40–1.50)
GFR: 57.02 mL/min — ABNORMAL LOW (ref >60.00)
Glucose, Bld: 99 mg/dL (ref 70–99)
Potassium: 3.6 mEq/L (ref 3.5–5.1)
Sodium: 132 mEq/L — ABNORMAL LOW (ref 135–145)
Total Bilirubin: 0.5 mg/dL (ref 0.2–1.2)
Total Protein: 7.1 g/dL (ref 6.0–8.3)

## 2021-08-15 LAB — VITAMIN B12: Vitamin B-12: 348 pg/mL (ref 211–911)

## 2021-08-15 NOTE — Patient Instructions (Addendum)
Health Maintenance Due  Topic Date Due   Zoster Vaccines- Shingrix (1 of 2) - Please consider getting your shingles shot at your local pharmacy-if received, please let us know.  Never done   Please schedule your appointment to get your bivalent covid booster shot at your local pharmacy. When received, please let us know.   You opted out flu shot at this time.   Please try half tablet of Ziac and update me in 2 weeks with blood pressure readings on mychart.   Please stop by lab before you go If you have mychart- we will send your results within 3 business days of [place] receiving them.  If you do not have mychart- we will call you about results within 5 business days of [place] receiving them.  *please also note that you will see labs on mychart as soon as they post. I will later go in and write notes on them- will say "notes from Dr. Yong Channel"   Recommended follow up: Return in about 6 months (around 02/12/2022) for follow up- or sooner if needed.

## 2021-09-16 ENCOUNTER — Other Ambulatory Visit: Payer: Self-pay | Admitting: Family Medicine

## 2021-10-06 ENCOUNTER — Other Ambulatory Visit: Payer: Self-pay | Admitting: Internal Medicine

## 2021-10-15 ENCOUNTER — Telehealth: Payer: Medicare HMO

## 2021-10-17 DIAGNOSIS — L400 Psoriasis vulgaris: Secondary | ICD-10-CM | POA: Diagnosis not present

## 2021-12-24 ENCOUNTER — Other Ambulatory Visit: Payer: Self-pay | Admitting: Family Medicine

## 2021-12-24 ENCOUNTER — Other Ambulatory Visit: Payer: Self-pay | Admitting: Internal Medicine

## 2022-01-01 DIAGNOSIS — F1020 Alcohol dependence, uncomplicated: Secondary | ICD-10-CM | POA: Diagnosis not present

## 2022-01-01 DIAGNOSIS — R69 Illness, unspecified: Secondary | ICD-10-CM | POA: Diagnosis not present

## 2022-01-04 DIAGNOSIS — F1020 Alcohol dependence, uncomplicated: Secondary | ICD-10-CM | POA: Diagnosis not present

## 2022-01-04 DIAGNOSIS — R69 Illness, unspecified: Secondary | ICD-10-CM | POA: Diagnosis not present

## 2022-01-07 DIAGNOSIS — R69 Illness, unspecified: Secondary | ICD-10-CM | POA: Diagnosis not present

## 2022-01-07 DIAGNOSIS — F1020 Alcohol dependence, uncomplicated: Secondary | ICD-10-CM | POA: Diagnosis not present

## 2022-01-21 DIAGNOSIS — F1020 Alcohol dependence, uncomplicated: Secondary | ICD-10-CM | POA: Diagnosis not present

## 2022-01-21 DIAGNOSIS — R69 Illness, unspecified: Secondary | ICD-10-CM | POA: Diagnosis not present

## 2022-02-11 ENCOUNTER — Other Ambulatory Visit: Payer: Self-pay | Admitting: Internal Medicine

## 2022-02-18 ENCOUNTER — Ambulatory Visit (INDEPENDENT_AMBULATORY_CARE_PROVIDER_SITE_OTHER): Payer: Medicare HMO | Admitting: Family Medicine

## 2022-02-18 ENCOUNTER — Encounter: Payer: Self-pay | Admitting: Family Medicine

## 2022-02-18 VITALS — BP 108/64 | HR 67 | Temp 98.3°F | Ht 71.0 in | Wt 231.0 lb

## 2022-02-18 DIAGNOSIS — E785 Hyperlipidemia, unspecified: Secondary | ICD-10-CM

## 2022-02-18 DIAGNOSIS — F1021 Alcohol dependence, in remission: Secondary | ICD-10-CM

## 2022-02-18 DIAGNOSIS — I2510 Atherosclerotic heart disease of native coronary artery without angina pectoris: Secondary | ICD-10-CM | POA: Diagnosis not present

## 2022-02-18 DIAGNOSIS — Z9861 Coronary angioplasty status: Secondary | ICD-10-CM | POA: Diagnosis not present

## 2022-02-18 DIAGNOSIS — I10 Essential (primary) hypertension: Secondary | ICD-10-CM

## 2022-02-18 DIAGNOSIS — R69 Illness, unspecified: Secondary | ICD-10-CM | POA: Diagnosis not present

## 2022-02-18 DIAGNOSIS — G621 Alcoholic polyneuropathy: Secondary | ICD-10-CM

## 2022-02-18 LAB — COMPREHENSIVE METABOLIC PANEL
ALT: 25 U/L (ref 0–53)
AST: 30 U/L (ref 0–37)
Albumin: 4.1 g/dL (ref 3.5–5.2)
Alkaline Phosphatase: 33 U/L — ABNORMAL LOW (ref 39–117)
BUN: 16 mg/dL (ref 6–23)
CO2: 22 mEq/L (ref 19–32)
Calcium: 9 mg/dL (ref 8.4–10.5)
Chloride: 108 mEq/L (ref 96–112)
Creatinine, Ser: 1.33 mg/dL (ref 0.40–1.50)
GFR: 52.74 mL/min — ABNORMAL LOW (ref >60.00)
Glucose, Bld: 117 mg/dL — ABNORMAL HIGH (ref 70–99)
Potassium: 3.7 mEq/L (ref 3.5–5.1)
Sodium: 141 mEq/L (ref 135–145)
Total Bilirubin: 0.5 mg/dL (ref 0.2–1.2)
Total Protein: 6.9 g/dL (ref 6.0–8.3)

## 2022-02-18 LAB — CBC WITH DIFFERENTIAL/PLATELET
Basophils Absolute: 0.1 10*3/uL (ref 0.0–0.1)
Basophils Relative: 0.9 % (ref 0.0–3.0)
Eosinophils Absolute: 0.2 10*3/uL (ref 0.0–0.7)
Eosinophils Relative: 2.9 % (ref 0.0–5.0)
HCT: 43.2 % (ref 39.0–52.0)
Hemoglobin: 14.8 g/dL (ref 13.0–17.0)
Lymphocytes Relative: 28.7 % (ref 12.0–46.0)
Lymphs Abs: 1.9 10*3/uL (ref 0.7–4.0)
MCHC: 34.3 g/dL (ref 30.0–36.0)
MCV: 104.4 fl — ABNORMAL HIGH (ref 78.0–100.0)
Monocytes Absolute: 0.5 10*3/uL (ref 0.1–1.0)
Monocytes Relative: 7.9 % (ref 3.0–12.0)
Neutro Abs: 4 10*3/uL (ref 1.4–7.7)
Neutrophils Relative %: 59.6 % (ref 43.0–77.0)
Platelets: 155 10*3/uL (ref 150.0–400.0)
RBC: 4.14 Mil/uL — ABNORMAL LOW (ref 4.22–5.81)
RDW: 13.2 % (ref 11.5–15.5)
WBC: 6.7 10*3/uL (ref 4.0–10.5)

## 2022-02-18 LAB — LDL CHOLESTEROL, DIRECT: Direct LDL: 60 mg/dL

## 2022-02-18 NOTE — Patient Instructions (Addendum)
-  recommended trial MiraLAX half capful daily as well as increasing water intake regularly to at least 50 ounces and trying to increase exercise/activity levels-if not improving or worsens asked him to let me know-we could consider imaging such as CT scan of the abdomen to rule out diverticulitis  -Please call to schedule cardiology follow-up. We can still order chest x-ray at later date if they do not think cardiac since you wanted to hold off today  -try quarter tablet of ziac. Ideally monitor blood pressure with goal <135/85  Recommended follow up: Return in about 6 months (around 08/21/2022) for physical or sooner if needed.Schedule b4 you leave.

## 2022-02-18 NOTE — Progress Notes (Signed)
Phone [phone] In person visit   Subjective:   Charles Villa is a 76 y.o. year old very pleasant male patient who presents for/with See problem oriented charting Chief Complaint  Patient presents with   Follow-up   Hypertension   Hyperlipidemia   Abdominal Pain    Pt c/o llq pain that started a couple weeks ago that he notices when he is full. He does c/o constipation at times.    Past Medical History-  Patient Active Problem List   Diagnosis Date Noted   CAD S/P percutaneous coronary angioplasty 02/24/2017    Priority: High   Alcoholism in recovery [facility]) 02/03/2017    Priority: High   History of adenomatous polyp of colon 11/02/2019    Priority: Medium    Alcoholic peripheral neuropathy ([place]) 02/09/2019    Priority: Medium    Thiamine deficiency 04/28/2017    Priority: Medium    Tremor 05/06/2016    Priority: Medium    OCD (obsessive compulsive disorder) 01/23/2016    Priority: Medium    Essential hypertension 03/11/2014    Priority: Medium    Hyperlipidemia with target low density lipoprotein (LDL) cholesterol less than 70 mg/dL 02/10/2007    Priority: Medium    PSORIASIS 02/10/2007    Priority: Medium    Senile purpura ([place]) 05/06/2019    Priority: Low   S/P cardiac catheterization 02/24/2017    Priority: Low   Anal fissure 08/17/2012    Priority: Low   SQUAMOUS CELL CARCINOMA OF SKIN SITE UNSPECIFIED 08/24/2010    Priority: Low   GERD 02/23/2007    Priority: Low   BACK PAIN, CHRONIC 02/10/2007    Priority: Low    Medications- reviewed and updated Current Outpatient Medications  Medication Sig Dispense Refill   aspirin EC 81 MG tablet Take 81 mg by mouth daily.     bisoprolol-hydrochlorothiazide (ZIAC) 2.5-6.25 MG tablet TAKE 1 TABLET BY MOUTH EVERY DAY 90 tablet 3   clobetasol (TEMOVATE) 0.05 % external solution Apply 1 application topically at bedtime as needed (scalp).   5   clobetasol cream (TEMOVATE) 2.99 % Apply 1 application topically 2  (two) times daily as needed (abdomen).   3   clotrimazole-betamethasone (LOTRISONE) cream Apply 1 application topically 2 (two) times daily as needed. psoriass     desonide (DESONATE) 0.05 % gel Apply 1 application topically 2 (two) times daily as needed. Skin lesions     fluocinolone (VANOS) 0.01 % cream Apply 1 application topically daily as needed.      hydrocortisone (ANUSOL-HC) 2.5 % rectal cream Place 1 application rectally 2 (two) times daily as needed for hemorrhoids or anal itching.     hydrocortisone 2.5 % cream Apply 1 application topically 2 (two) times daily as needed (face).   5   icosapent Ethyl (VASCEPA) 1 g capsule TAKE 2 CAPSULES BY MOUTH TWO TIMES A DAY 360 capsule 1   pantoprazole (PROTONIX) 40 MG tablet TAKE 1 TABLET BY MOUTH EVERY DAY 30 tablet 0   rosuvastatin (CRESTOR) 40 MG tablet TAKE 1 TABLET BY MOUTH EVERY DAY 90 tablet 1   silver sulfADIAZINE (SILVADENE) 1 % cream Apply 1 application topically 2 (two) times daily as needed (groin).   5   No current facility-administered medications for this visit.     Objective:  BP 108/64   Pulse 67   Temp 98.3 F (36.8 C)   Ht '5\' 11"'$  (1.803 m)   Wt 231 lb (104.8 kg)   SpO2 93%  BMI 32.22 kg/m  Gen: NAD, resting comfortably CV: RRR no murmurs rubs or gallops Lungs: CTAB no crackles, wheeze, rhonchi Abdomen: soft/nontender/nondistended/normal bowel sounds. No rebound or guarding.  Ext: no edema Skin: warm, dry    Assessment and Plan   #Left lower quadrant abdominal pain S: Pain started a few months ago.  Notices when he is full that he has pain in the area- relieves with bowel movement. At times quite regular/daily but usually every 2-3 days (occurs on 2-3 day side of things). OJ in morning, coke/sprite/gingerale in the day may have some water- usually low 30s to 50 oz. Not as physically active.   - colonoscopy up to date 10/21/19 with 3 year repeat- due next year . Pain only 2/10 at its worst A/P: since this relieves  completely when he has a bowel movement-doubt more significant/malignant pathology-recommended trial MiraLAX half capful daily as well as increasing water intake regularly to at least 50 ounces and trying to increase exercise/activity levels-if not improving or worsens asked him to let me know-we could consider imaging such as CT scan of the abdomen to rule out diverticulitis  #CAD-follows with Dr. Debara Pickett.  Status post drug-eluting stent 2018 #hyperlipidemia-LDL goal under 70 S: Medication: Rosuvastatin 40 mg, Vascepa 2 g twice daily, aspirin 81 mg daily In regards to CAD potential symptoms -no chest pain. Occasional shortness of breath with activity like pushing lawnmower- perhaps slightly worse  Lab Results  Component Value Date   CHOL 165 08/15/2021   HDL 58.20 08/15/2021   LDLCALC 71 08/15/2021   LDLDIRECT 76.0 02/06/2021   TRIG 177.0 (H) 08/15/2021   CHOLHDL 3 08/15/2021    A/P: CAD largely asypmtomatic but does have some mildly worsening SOB- recommended cardiology follow up. For lipids- just a hair above goal- update direct LDL with labs -with some SOB offered CXR- he wants to visit with cardiology and may reach back out to me  #hypertension S: medication: Ziac 2.5-6.'25Mg'$  (only takes half) Home readings #s: Has home cuff but no recent check BP Readings from Last 3 Encounters:  02/18/22 108/64  08/15/21 100/64  02/06/21 100/62  A/P: well-controlled but we have space to actually reduce the medicine-he wants to try quarter tablet instead of trialing off at this time-we will recheck next visit on lower dose  # GERD S:Medication: Protonix '40Mg'$ - if gets to 48 hours symptoms come back- mainly gets away with every other day  B12 low normal in the past-I recommend 1000 mcg daily - takes b complex Lab Results  Component Value Date   VITAMINB12 348 08/15/2021  A/P: reasonable control- glad he has been able to space out some- continue current meds   #Alcoholic neuropathy/alcohol  dependence S: Medication: b complex with thiamine.  -takes 2-3 beverages per day.   Symptoms: Numbness and tingling in feet noted.  Denies significant pain still   A/P: with alcohol history advise full cessation/consider AA. Neuropathy stable but ideally would quit alcohol to not worsen over longg term   #Macrocytosis- likely related to alcohol intake.  B12 and folate levels have been normal, pathology smear review was normal-no further work-up planned unless worsening cell lines  Recommended follow up: Return in about 6 months (around 08/21/2022) for physical or sooner if needed.Schedule b4 you leave.  Lab/Order associations:   ICD-10-CM   1. Essential hypertension  I10     2. Hyperlipidemia with target low density lipoprotein (LDL) cholesterol less than 70 mg/dL  E78.5 CBC with Differential/Platelet    Comprehensive  metabolic panel    LDL cholesterol, direct    3. CAD S/P percutaneous coronary angioplasty  I25.10    Z98.61     4. Alcoholism in recovery ([place])  F10.21     5. Alcoholic peripheral neuropathy (HCC)  G62.1       No orders of the defined types were placed in this encounter.   Return precautions advised.  Garret Reddish, MD

## 2022-03-14 ENCOUNTER — Encounter: Payer: Self-pay | Admitting: Family Medicine

## 2022-04-17 ENCOUNTER — Encounter: Payer: Self-pay | Admitting: Physician Assistant

## 2022-04-17 ENCOUNTER — Ambulatory Visit: Payer: Medicare HMO | Attending: Physician Assistant | Admitting: Nurse Practitioner

## 2022-04-17 VITALS — BP 106/66 | HR 56 | Ht 71.0 in | Wt 227.2 lb

## 2022-04-17 DIAGNOSIS — Z955 Presence of coronary angioplasty implant and graft: Secondary | ICD-10-CM | POA: Diagnosis not present

## 2022-04-17 DIAGNOSIS — I10 Essential (primary) hypertension: Secondary | ICD-10-CM

## 2022-04-17 DIAGNOSIS — R69 Illness, unspecified: Secondary | ICD-10-CM | POA: Diagnosis not present

## 2022-04-17 DIAGNOSIS — R0789 Other chest pain: Secondary | ICD-10-CM

## 2022-04-17 DIAGNOSIS — E785 Hyperlipidemia, unspecified: Secondary | ICD-10-CM

## 2022-04-17 DIAGNOSIS — I2510 Atherosclerotic heart disease of native coronary artery without angina pectoris: Secondary | ICD-10-CM | POA: Diagnosis not present

## 2022-04-17 DIAGNOSIS — R0609 Other forms of dyspnea: Secondary | ICD-10-CM | POA: Diagnosis not present

## 2022-04-17 DIAGNOSIS — E781 Pure hyperglyceridemia: Secondary | ICD-10-CM | POA: Diagnosis not present

## 2022-04-17 DIAGNOSIS — F1010 Alcohol abuse, uncomplicated: Secondary | ICD-10-CM

## 2022-04-17 MED ORDER — FENOFIBRATE 160 MG PO TABS: 160.0000 mg | ORAL_TABLET | Freq: Every day | ORAL | 3 refills | Status: DC

## 2022-04-17 NOTE — Patient Instructions (Addendum)
Medication Instructions:  START Fenofibrate 160 mg daily   *If you need a refill on your cardiac medications before your next appointment, please call your pharmacy*  Lab Work: NONE ordered at this time of appointment   If you have labs (blood work) drawn today and your tests are completely normal, you will receive your results only by: [place] (if you have MyChart) OR A paper copy in the mail If you have any lab test that is abnormal or we need to change your treatment, we will call you to review the results.  Testing/Procedures: Your physician has requested that you have an echocardiogram. Echocardiography is a painless test that uses sound waves to create images of your heart. It provides your doctor with information about the size and shape of your heart and how well your heart's chambers and valves are working. This procedure takes approximately one hour. There are no restrictions for this procedure.  Follow-Up: At [facility], you and your health needs are our priority.  As part of our continuing mission to provide you with exceptional heart care, we have created designated Provider Care Teams.  These Care Teams include your primary Cardiologist (physician) and Advanced Practice Providers (APPs -  Physician Assistants and Nurse Practitioners) who all work together to provide you with the care you need, when you need it.    Your next appointment:   1 month(s)  The format for your next appointment:   In Person  Provider:   Almyra Deforest, PA-C        Other Instructions   Important Information About Sugar

## 2022-04-17 NOTE — Progress Notes (Signed)
Cardiology Office Note:    Date:  04/17/2022   ID:  Charles Villa, DOB Jun 29, 1946, MRN [id number]  PCP:  Marin Olp, MD   [place] Providers Cardiologist:  Pixie Casino, MD     Referring MD: Marin Olp, MD   CC: Dyspnea on exertion and atypical CP  History of Present Illness:    Charles Villa is a 76 y.o. male with a hx of the following:  HTN CAD, s/p PCI DES to mid circumflex in 2018 Senile purpura GERD HLD OCD Previous hx of alcoholism Thiamine deficiency Chronic back pain   Previous cardiovascular history includes left heart cath in 2018 that revealed severe focal single lesion vessel involving the mid circumflex just prior to the large lateral OM branch, successful PCI DES to mid circumflex. LVEF 55 to 65%.  LVEDP normal.  Severe focal single lesion vessel involving the mid circumflex just prior to the large lateral OM branch.  Last seen by Dr. Debara Pickett on 10/27/2020 for and is doing well from a cardiac perspective, he had cut down significantly on his alcohol use.  Reported occasional chest discomfort that was short-lived, not really associated with exertion or relieved with rest.  EKG today showed NSR, incomplete RBBB, no ischemic changes.  Had made changes in his diet.  Dr. Debara Pickett advised him to continue to monitor his symptoms with exertion and will monitor at the time.  Encouraged him to continue to work on weight loss and physical activity.   Today he presents for 74-monthfollow-up appointment. Still drinking 2-3 alcoholic drinks per day for the past 5-6 years, attends AA regularly but not interested in resources or other assistance at this time. Reports a shooting pain that goes from the left side of his neck and radiates to occipital side of skull, lasts around 30 seconds to a couple of minutes, happens around 3-4 times per week, or sometimes not at all. Says sometimes he has chest pain with this, but mainly not. Does not seem to be related to  exertion and occurs randomly. Says Ibuprofen helps relieves this pain completely. Sometimes has a gradual HA with this shooting pain, but doesn't linger. Denies any palpitations, fast heart rates, orthopnea, PND, swelling, acute bleeding, dizziness, syncope, presyncope, or claudication. Stays busy by doing housework, yard work, and walks around his neighborhood. Says he feels like he has lost his stamina. Chronic DOE that has not worsened just more noticeable per his report, notices this while mowing his lawn. BP well controlled at home. Denies any other questions or concerns.   Past Medical History:  Diagnosis Date   Adenomatous colon polyp 11/02/2019   Anal fissure 08/17/2012   Really more gluteal crease irritation- protosol hc 2.5% prn    BACK PAIN, CHRONIC 02/10/2007   CAD (coronary artery disease), native coronary artery    02/24/17-PCI/DES to mLcx, normal EF.   Diverticulosis of colon    2007   GERD 02/23/2007   GERD (gastroesophageal reflux disease)    Hyperlipidemia    Hypertension    OCD (obsessive compulsive disorder) 01/23/2016   Ocd/hoarding issues- psychiatristis- [place] started on zoloft '100mg'$ --> paxil   Personal history of colonic polyps 03/14/2014   08/2005. Benign. 10 year repeat. 2010 colonoscopy in record was entered erroneously.     PSORIASIS 02/10/2007   Topical therapy.      Scalp psoriasis    SQUAMOUS CELL CARCINOMA OF SKIN SITE UNSPECIFIED 08/24/2010   Annotation: face     Past  Surgical History:  Procedure Laterality Date   COLONOSCOPY     COLONOSCOPY W/ POLYPECTOMY     2007 [name]   CORONARY STENT INTERVENTION N/A 02/24/2017   Procedure: Coronary Stent Intervention;  Surgeon: Leonie Man, MD;  Location: [facility];  Service: Cardiovascular;  Laterality: N/A;   LEFT HEART CATH AND CORONARY ANGIOGRAPHY N/A 02/24/2017   Procedure: Left Heart Cath and Coronary Angiography;  Surgeon: Leonie Man, MD;  Location: [place] [facility];  Service:  Cardiovascular;  Laterality: N/A;   POLYPECTOMY     SKIN CANCER EXCISION     squamous cell CA - nose   TONSILLECTOMY      Current Medications: Current Meds  Medication Sig   aspirin EC 81 MG tablet Take 81 mg by mouth daily.   bisoprolol-hydrochlorothiazide (ZIAC) 2.5-6.25 MG tablet TAKE 1 TABLET BY MOUTH EVERY DAY (Patient taking differently: Take 0.5 tablets by mouth daily.)   clobetasol (TEMOVATE) 0.05 % external solution Apply 1 application topically at bedtime as needed (scalp).    clobetasol cream (TEMOVATE) 5.28 % Apply 1 application topically 2 (two) times daily as needed (abdomen).    clotrimazole-betamethasone (LOTRISONE) cream Apply 1 application topically 2 (two) times daily as needed. psoriass   fenofibrate 160 MG tablet Take 1 tablet (160 mg total) by mouth daily.   fluocinolone (VANOS) 0.01 % cream Apply 1 application topically daily as needed.    hydrocortisone (ANUSOL-HC) 2.5 % rectal cream Place 1 application rectally 2 (two) times daily as needed for hemorrhoids or anal itching.   hydrocortisone 2.5 % cream Apply 1 application topically 2 (two) times daily as needed (face).    icosapent Ethyl (VASCEPA) 1 g capsule TAKE 2 CAPSULES BY MOUTH TWO TIMES A DAY   pantoprazole (PROTONIX) 40 MG tablet TAKE 1 TABLET BY MOUTH EVERY DAY (Patient taking differently: Take 40 mg by mouth every other day.)   rosuvastatin (CRESTOR) 40 MG tablet TAKE 1 TABLET BY MOUTH EVERY DAY   silver sulfADIAZINE (SILVADENE) 1 % cream Apply 1 application topically 2 (two) times daily as needed (groin).      Allergies:   Patient has no known allergies.   Social History   Socioeconomic History   Marital status: Married    Spouse name: Not on file   Number of children: 2   Years of education: Not on file   Highest education level: Not on file  Occupational History   Occupation: Corporate treasurer    Employer: UNEMPLOYED   Occupation: retired  Tobacco Use   Smoking status: Never    Smokeless tobacco: Never  Vaping Use   Vaping Use: Never used  Substance and Sexual Activity   Alcohol use: Yes    Alcohol/week: 21.0 standard drinks of alcohol    Types: 21 Standard drinks or equivalent per week    Comment: 2-3 per day (gin)   Drug use: No   Sexual activity: Not on file  Other Topics Concern   Not on file  Social History Narrative   Married 42 years in 2015. 2 kids, 1 grandchild that is 13.89 years old.       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      Hobbies: ride bike, ski in winter    Social Determinants of Health   Financial Resource Strain: Low Risk  (02/11/2020)   Overall Financial Resource Strain (CARDIA)    Difficulty of Paying Living Expenses: Not hard at all  Food  Insecurity: No Food Insecurity (05/01/2020)   Hunger Vital Sign    Worried About Running Out of Food in the Last Year: Never true    Ran Out of Food in the Last Year: Never true  Transportation Needs: No Transportation Needs (05/01/2020)   PRAPARE - Hydrologist (Medical): No    Lack of Transportation (Non-Medical): No  Physical Activity: Insufficiently Active (02/11/2020)   Exercise Vital Sign    Days of Exercise per Week: 2 days    Minutes of Exercise per Session: 30 min  Stress: No Stress Concern Present (02/11/2020)   [place]    Feeling of Stress : Not at all  Social Connections: Moderately Integrated (02/11/2020)   Social Connection and Isolation Panel [NHANES]    Frequency of Communication with Friends and Family: Once a week    Frequency of Social Gatherings with Friends and Family: More than three times a week    Attends Religious Services: More than 4 times per year    Active Member of [employer] or Organizations: No    Attends Archivist Meetings: Never    Marital Status: Married     Family History: The patient's family history includes Hypertension in his  mother; Lung cancer in his father; Stroke in his mother. There is no history of Colon polyps, Colon cancer, Esophageal cancer, Rectal cancer, or Stomach cancer.  ROS:   Review of Systems  Constitutional: Negative.   HENT: Negative.    Eyes: Negative.   Respiratory:  Positive for shortness of breath. Negative for cough, hemoptysis, sputum production and wheezing.        See HPI.  Cardiovascular:  Positive for chest pain. Negative for palpitations, orthopnea, claudication, leg swelling and PND.       Atypical.  See HPI.  Gastrointestinal: Negative.   Genitourinary: Negative.   Musculoskeletal: Negative.   Skin: Negative.   Neurological:  Positive for tremors. Negative for dizziness, tingling, sensory change, speech change, focal weakness, seizures, loss of consciousness, weakness and headaches.       Chronic essential tremor.   Endo/Heme/Allergies: Negative.   Psychiatric/Behavioral: Negative.      Please see the history of present illness.    All other systems reviewed and are negative.  EKGs/Labs/Other Studies Reviewed:    The following studies were reviewed today:   EKG:  EKG is ordered today.  The ekg ordered today demonstrates sinus bradycardia, 56 bpm, incomplete RBBB, otherwise nothing acute.    Left heart cath and coronary angiography on February 24, 2017: Mid Cx lesion, 90 %stenosed. A STENT PROMUS PREM MR 3.5X16 drug eluting stent was successfully placed. Post intervention, there is a 0% residual stenosis. _______________ Moderate disease in the ostial and mid LAD with proximal-mid ectasia The left ventricular systolic function is normal. The left ventricular ejection fraction is 55-65% by visual estimate. LV end diastolic pressure is normal.   Severe focal single vessel disease involving the mid circumflex just prior to large lateral OM branch. Successful PCI DES.   Plan: Same-day discharged today on dual antiplatelet therapy for minimum one year. Patient will  likely need titration of his anti-hypertensive agents as well as cardiac risk factor modification.   He will follow with Dr. Debara Pickett.  Exercise tolerance test on February 18, 2017: BP demonstrated normal response to exercise.  Horizontal ST segment depression, ST segment depression was noted during stress in the lead II, III, aVF, V6 and V5,  beginning at 5 minutes of stress, and returning to baseline after 5 to 9 minutes of recovery.  Abnormal exercise tolerance test-significant ischemia.  2D echocardiogram on February 18, 2017: Left ventricle: The cavity size was mildly dilated. Systolic    function was normal. The estimated ejection fraction was in the    range of 55% to 60%. Wall motion was normal; there were no    regional wall motion abnormalities. There was an increased    relative contribution of atrial contraction to ventricular    filling. Doppler parameters are consistent with abnormal left    ventricular relaxation (grade 1 diastolic dysfunction).  - Aortic valve: Trileaflet; mildly thickened, mildly calcified    leaflets.  - Mitral valve: Structurally normal valve. Valve area by pressure    half-time: 1.71 cm^2.  - Tricuspid valve: There was trivial regurgitation.   Recent Labs: 02/18/2022: ALT 25; BUN 16; Creatinine, Ser 1.33; Hemoglobin 14.8; Platelets 155.0; Potassium 3.7; Sodium 141  Recent Lipid Panel    Component Value Date/Time   CHOL 165 08/15/2021 1211   TRIG 177.0 (H) 08/15/2021 1211   TRIG 89 08/11/2006 0818   HDL 58.20 08/15/2021 1211   CHOLHDL 3 08/15/2021 1211   VLDL 35.4 08/15/2021 1211   LDLCALC 71 08/15/2021 1211   LDLDIRECT 60.0 02/18/2022 1410     Risk Assessment/Calculations:    The 10-year ASCVD risk score (Arnett DK, et al., 2019) is: 17.7%   Values used to calculate the score:     Age: 65 years     Sex: Male     Is Non-Hispanic African American: No     Diabetic: No     Tobacco smoker: No     Systolic Blood Pressure: 053 mmHg     Is BP treated:  Yes     HDL Cholesterol: 58.2 mg/dL     Total Cholesterol: 165 mg/dL  Physical Exam:    VS:  BP 106/66   Pulse (!) 56   Ht '5\' 11"'$  (1.803 m)   Wt 227 lb 3.2 oz (103.1 kg)   SpO2 93%   BMI 31.69 kg/m     Wt Readings from Last 3 Encounters:  04/17/22 227 lb 3.2 oz (103.1 kg)  02/18/22 231 lb (104.8 kg)  08/15/21 233 lb 12.8 oz (106.1 kg)     GEN: Well nourished, well developed in no acute distress HEENT: Normal NECK: No JVD; No carotid bruits CARDIAC: RRR, no murmurs, rubs, gallops; 2+ peripheral pulses throughout, strong and equal bilaterally RESPIRATORY:  Clear and diminished to auscultation without rales, wheezing or rhonchi  MUSCULOSKELETAL:  No edema; No deformity  SKIN: Warm and dry NEUROLOGIC:  Alert and oriented x 3 PSYCHIATRIC:  Normal affect   ASSESSMENT:    1. Atypical chest pain   2. DOE (dyspnea on exertion)   3. Coronary artery disease involving native heart, unspecified vessel or lesion type, unspecified whether angina present   4. S/P coronary artery stent placement   5. Hyperlipidemia, unspecified hyperlipidemia type   6. Hypertriglyceridemia   7. Hypertension, unspecified type   8. Alcohol abuse    PLAN:    In order of problems listed above:  Atypical chest pain, Dyspnea on exertion - chronic, not progressing He has had chronic dyspnea on exertion that has not worsened over time but is more noticeable per his report.  Last echocardiogram on file from 2018 revealed LVEF 55 to 97%, grade 1 diastolic dysfunction, trivial TR.  We will update 2D echo at this  time. [place] him undergoing cardiac cath in the past.  If shortness of breath does not improve by next follow-up visit, will discuss noninvasive testing, i.e. nuclear medicine stress test with patient.  Denies any typical anginal pain.  Symptoms sound very atypical in nature.  EKG today does not reveal anything acute or show any signs of  ischemia, heart rate 56 - asymptomatic with this and HR has been around this during previous visits, chronic RBBB. We will carefully monitor this for now, and consider nuclear medicine stress test if symptoms do not improve. Discussed ED precautions.  2. CAD, status post DES to mCx, hyperlipidemia, hypertriglyceridemia - chronic, stable No typical anginal symptoms as noted above, atypical presentation of symptoms with shooting left neck pain and headache.  No red flag symptoms.  Recommend Tylenol/ibuprofen as needed for symptoms.  If symptoms do not improve as mentioned above, plan to consider nuclear medicine stress test for patient. Initiate fenofibrate 160 mg daily hypertriglyceridemia.  At next follow-up visit, we will discuss getting fasting lipid panel and liver function enzymes the next month.  Discussed ED precautions.  3. HTN - chronic, stable Blood pressure today low normal on exam, 106/66.  States blood pressure is stable at home. Discussed to monitor BP at home at least 2 hours after medications and sitting for 5-10 minutes.  Continue current medication regimen.  4. Alcohol abuse - chronic, not progressing Still consuming 2-3 alcoholic drinks per day, has been doing this for the past 5 to 6 years.  Reports he goes to [employer].  Not interested in quitting at this time.  Encouraged him to cut back on his alcohol use and risk associated with mixing prescription medications with alcohol.  Verbalized understanding.  5. Disposition: Follow-up with me or Almyra Deforest, PA-C in 1 month or sooner if anything changes.  Medication Adjustments/Labs and Tests Ordered: Current medicines are reviewed at length with the patient today.  Concerns regarding medicines are outlined above.  Orders Placed This Encounter  Procedures   EKG 12-Lead   ECHOCARDIOGRAM COMPLETE   Meds ordered this encounter  Medications   fenofibrate 160 MG tablet    Sig: Take 1 tablet (160 mg total) by mouth daily.    Dispense:   90 tablet    Refill:  3    Patient Instructions  Medication Instructions:  START Fenofibrate 160 mg daily   *If you need a refill on your cardiac medications before your next appointment, please call your pharmacy*  Lab Work: NONE ordered at this time of appointment   If you have labs (blood work) drawn today and your tests are completely normal, you will receive your results only by: [place] (if you have MyChart) OR A paper copy in the mail If you have any lab test that is abnormal or we need to change your treatment, we will call you to review the results.  Testing/Procedures: Your physician has requested that you have an echocardiogram. Echocardiography is a painless test that uses sound waves to create images of your heart. It provides your doctor with information about the size and shape of your heart and how well your heart's chambers and valves are working. This procedure takes approximately one hour. There are no restrictions for this procedure.  Follow-Up: At [facility], you and your health needs are our priority.  As part of our continuing mission to provide you with exceptional heart care,  we have created designated Provider Care Teams.  These Care Teams include your primary Cardiologist (physician) and Advanced Practice Providers (APPs -  Physician Assistants and Nurse Practitioners) who all work together to provide you with the care you need, when you need it.    Your next appointment:   1 month(s)  The format for your next appointment:   In Person  Provider:   Almyra Deforest, PA-C        Other Instructions   Important Information About Sugar        Signed, Finis Bud, NP  04/17/2022 10:13 PM    [place]

## 2022-04-22 ENCOUNTER — Encounter: Payer: Self-pay | Admitting: *Deleted

## 2022-04-26 ENCOUNTER — Ambulatory Visit (HOSPITAL_COMMUNITY): Payer: Medicare HMO | Attending: Nurse Practitioner

## 2022-04-26 DIAGNOSIS — R0609 Other forms of dyspnea: Secondary | ICD-10-CM | POA: Diagnosis not present

## 2022-04-26 MED ORDER — PERFLUTREN LIPID MICROSPHERE
1.0000 mL | INTRAVENOUS | Status: AC | PRN
  Administered 2022-04-26: 2 mL via INTRAVENOUS

## 2022-04-27 LAB — ECHOCARDIOGRAM COMPLETE
Area-P 1/2: 2.69 cm2
S' Lateral: 3.7 cm

## 2022-05-16 ENCOUNTER — Ambulatory Visit: Payer: Medicare HMO | Admitting: Physician Assistant

## 2022-05-24 ENCOUNTER — Ambulatory Visit: Payer: Medicare HMO | Attending: Physician Assistant | Admitting: Physician Assistant

## 2022-05-24 ENCOUNTER — Encounter: Payer: Self-pay | Admitting: Physician Assistant

## 2022-05-24 VITALS — BP 108/76 | HR 60 | Wt 233.4 lb

## 2022-05-24 DIAGNOSIS — I10 Essential (primary) hypertension: Secondary | ICD-10-CM | POA: Diagnosis not present

## 2022-05-24 DIAGNOSIS — E785 Hyperlipidemia, unspecified: Secondary | ICD-10-CM | POA: Diagnosis not present

## 2022-05-24 DIAGNOSIS — I2510 Atherosclerotic heart disease of native coronary artery without angina pectoris: Secondary | ICD-10-CM

## 2022-05-24 NOTE — Patient Instructions (Signed)
Medication Instructions:  Your physician recommends that you continue on your current medications as directed. Please refer to the Current Medication list given to you today.  *If you need a refill on your cardiac medications before your next appointment, please call your pharmacy*  Lab Work: NONE ordered at this time of appointment   If you have labs (blood work) drawn today and your tests are completely normal, you will receive your results only by: [place] (if you have MyChart) OR A paper copy in the mail If you have any lab test that is abnormal or we need to change your treatment, we will call you to review the results.  Testing/Procedures: NONE ordered at this time of appointment   Follow-Up: At [facility], you and your health needs are our priority.  As part of our continuing mission to provide you with exceptional heart care, we have created designated Provider Care Teams.  These Care Teams include your primary Cardiologist (physician) and Advanced Practice Providers (APPs -  Physician Assistants and Nurse Practitioners) who all work together to provide you with the care you need, when you need it.   Your next appointment:   1 year(s)  The format for your next appointment:   In Person  Provider:   Pixie Casino, MD     Other Instructions  Important Information About Sugar

## 2022-05-24 NOTE — Progress Notes (Unsigned)
Cardiology Office Note:    Date:  05/26/2022   ID:  Charles Villa, DOB July 22, 1946, MRN [id number]  PCP:  Marin Olp, MD   [facility] Prov[facility] Cardiologist:  Pixie Casino, MD     Referring MD: Marin Olp, MD   Chief Complaint  Patient presents with   Follow-up    Seen for Dr. Debara Pickett    History of Present Illness:    Charles Villa is a 76 y.o. male with a hx of CAD, hypertension, hyperlipidemia, GERD, chronic back pain, longstanding alcohol use and psoriasis.  Echocardiogram obtained on 7/104/2018 showed EF 55 to 60%, no regional wall motion abnormality, grade 1 DD, trivial TR.  Exercise tolerance test obtained on 02/18/2017 showed horizontal ST segment depression noted in the inferolateral leads.  Subsequent cardiac catheterization performed on 02/24/2017 showed 90% mid left circumflex lesion treated with 3.5 x 16 mm DES, EF 55 to 65%.  He is on Vascepa for hypertriglyceridemia.  He was last seen by Dr. Debara Pickett in April 2022 at which time he has cut back on alcohol use significantly.  EKG at the time showed incomplete right bundle branch block, normal sinus rhythm.  Patient was last seen by Finis Bud on 04/17/2022 at which time he reported shooting pain that goes from the left side of his neck radiating to the occipital side of the skull lasting 30 seconds to a couple minutes.  If the symptom occurs 3-4 times per week.  He has chronic dyspnea on exertion which has not worsened.  Echocardiogram was obtained on 04/26/2022 which showed EF 60 to 65%, no regional wall motion abnormality, grade 1 DD, no significant valve issue.  Patient presents today for follow-up.  He is doing well and denies any recent chest pain.  He is able to do every day activity without significant dyspnea on exertion.  I recommended continue on the current therapy.  He has no lower extremity edema, orthopnea or PND.  He can follow-up with Dr. Debara Pickett in 1 year.  His annual blood work is  being followed by Dr. Yong Channel.   Past Medical History:  Diagnosis Date   Adenomatous colon polyp 11/02/2019   Anal fissure 08/17/2012   Really more gluteal crease irritation- protosol hc 2.5% prn    BACK PAIN, CHRONIC 02/10/2007   CAD (coronary artery disease), native coronary artery    02/24/17-PCI/DES to mLcx, normal EF.   Diverticulosis of colon    2007   GERD 02/23/2007   GERD (gastroesophageal reflux disease)    Hyperlipidemia    Hypertension    OCD (obsessive compulsive disorder) 01/23/2016   Ocd/hoarding issues- psychiatristis- [place] started on zoloft '100mg'$ --> paxil   Personal history of colonic polyps 03/14/2014   08/2005. Benign. 10 year repeat. 2010 colonoscopy in record was entered erroneously.     PSORIASIS 02/10/2007   Topical therapy.      Scalp psoriasis    SQUAMOUS CELL CARCINOMA OF SKIN SITE UNSPECIFIED 08/24/2010   Annotation: face     Past Surgical History:  Procedure Laterality Date   COLONOSCOPY     COLONOSCOPY W/ POLYPECTOMY     2007 [name]   CORONARY STENT INTERVENTION N/A 02/24/2017   Procedure: Coronary Stent Intervention;  Surgeon: Leonie Man, MD;  Location: [place] [facility];  Service: Cardiovascular;  Laterality: N/A;   LEFT HEART CATH AND CORONARY ANGIOGRAPHY N/A 02/24/2017   Procedure: Left Heart Cath and Coronary Angiography;  Surgeon: Leonie Man, MD;  Location: [place] [facility];  Service: Cardiovascular;  Laterality: N/A;   POLYPECTOMY     SKIN CANCER EXCISION     squamous cell CA - nose   TONSILLECTOMY      Current Medications: Current Meds  Medication Sig   aspirin EC 81 MG tablet Take 81 mg by mouth daily.   bisoprolol-hydrochlorothiazide (ZIAC) 2.5-6.25 MG tablet TAKE 1 TABLET BY MOUTH EVERY DAY (Patient taking differently: Take 0.5 tablets by mouth daily.)   clobetasol (TEMOVATE) 0.05 % external solution Apply 1 application topically at bedtime as needed (scalp).    clobetasol cream (TEMOVATE) 5.78 % Apply 1 application  topically 2 (two) times daily as needed (abdomen).    clotrimazole-betamethasone (LOTRISONE) cream Apply 1 application topically 2 (two) times daily as needed. psoriass   desonide (DESONATE) 0.05 % gel Apply 1 application  topically 2 (two) times daily as needed. Skin lesions   fenofibrate 160 MG tablet Take 1 tablet (160 mg total) by mouth daily.   fluocinolone (VANOS) 0.01 % cream Apply 1 application topically daily as needed.    hydrocortisone (ANUSOL-HC) 2.5 % rectal cream Place 1 application rectally 2 (two) times daily as needed for hemorrhoids or anal itching.   hydrocortisone 2.5 % cream Apply 1 application topically 2 (two) times daily as needed (face).    icosapent Ethyl (VASCEPA) 1 g capsule TAKE 2 CAPSULES BY MOUTH TWO TIMES A DAY   pantoprazole (PROTONIX) 40 MG tablet TAKE 1 TABLET BY MOUTH EVERY DAY (Patient taking differently: Take 40 mg by mouth every other day.)   rosuvastatin (CRESTOR) 40 MG tablet TAKE 1 TABLET BY MOUTH EVERY DAY   silver sulfADIAZINE (SILVADENE) 1 % cream Apply 1 application topically 2 (two) times daily as needed (groin).      Allergies:   Patient has no known allergies.   Social History   Socioeconomic History   Marital status: Married    Spouse name: Not on file   Number of children: 2   Years of education: Not on file   Highest education level: Not on file  Occupational History   Occupation: Corporate treasurer    Employer: UNEMPLOYED   Occupation: retired  Tobacco Use   Smoking status: Never   Smokeless tobacco: Never  Vaping Use   Vaping Use: Never used  Substance and Sexual Activity   Alcohol use: Yes    Alcohol/week: 21.0 standard drinks of alcohol    Types: 21 Standard drinks or equivalent per week    Comment: 2-3 per day (gin)   Drug use: No   Sexual activity: Not on file  Other Topics Concern   Not on file  Social History Narrative   Married 42 years in 2015. 2 kids, 1 grandchild that is 10.74 years old.       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      Hobbies: ride bike, ski in winter    Social Determinants of Health   Financial Resource Strain: Low Risk  (02/11/2020)   Overall Financial Resource Strain (CARDIA)    Difficulty of Paying Living Expenses: Not hard at all  Food Insecurity: No Food Insecurity (05/01/2020)   Hunger Vital Sign    Worried About Running Out of Food in the Last Year: Never true    Ran Out of Food in the Last Year: Never true  Transportation Needs: No Transportation Needs (05/01/2020)   PRAPARE - Hydrologist (Medical): No  Lack of Transportation (Non-Medical): No  Physical Activity: Insufficiently Active (02/11/2020)   Exercise Vital Sign    Days of Exercise per Week: 2 days    Minutes of Exercise per Session: 30 min  Stress: No Stress Concern Present (02/11/2020)   [place]    Feeling of Stress : Not at all  Social Connections: Moderately Integrated (02/11/2020)   Social Connection and Isolation Panel [NHANES]    Frequency of Communication with Friends and Family: Once a week    Frequency of Social Gatherings with Friends and Family: More than three times a week    Attends Religious Services: More than 4 times per year    Active Member of [employer] or Organizations: No    Attends Archivist Meetings: Never    Marital Status: Married     Family History: The patient's family history includes Hypertension in his mother; Lung cancer in his father; Stroke in his mother. There is no history of Colon polyps, Colon cancer, Esophageal cancer, Rectal cancer, or Stomach cancer.  ROS:   Please see the history of present illness.     All other systems reviewed and are negative.  EKGs/Labs/Other Studies Reviewed:    The following studies were reviewed today:  Echo 04/26/2022  1. Left ventricular ejection fraction, by estimation, is 60 to 65%. The  left ventricle  has normal function. The left ventricle has no regional  wall motion abnormalities. Left ventricular diastolic parameters are  consistent with Grade I diastolic  dysfunction (impaired relaxation).   2. Right ventricular systolic function is normal. The right ventricular  size is normal. Tricuspid regurgitation signal is inadequate for assessing  PA pressure.   3. The mitral valve is normal in structure. No evidence of mitral valve  regurgitation. No evidence of mitral stenosis.   4. The aortic valve is grossly normal. Aortic valve regurgitation is not  visualized. No aortic stenosis is present.   5. The inferior vena cava is normal in size with greater than 50%  respiratory variability, suggesting right atrial pressure of 3 mmHg.   Conclusion(s)/Recommendation(s): Normal biventricular function without  evidence of hemodynamically significant valvular heart disease.     EKG:  EKG is not ordered today.   Recent Labs: 02/18/2022: ALT 25; BUN 16; Creatinine, Ser 1.33; Hemoglobin 14.8; Platelets 155.0; Potassium 3.7; Sodium 141  Recent Lipid Panel    Component Value Date/Time   CHOL 165 08/15/2021 1211   TRIG 177.0 (H) 08/15/2021 1211   TRIG 89 08/11/2006 0818   HDL 58.20 08/15/2021 1211   CHOLHDL 3 08/15/2021 1211   VLDL 35.4 08/15/2021 1211   LDLCALC 71 08/15/2021 1211   LDLDIRECT 60.0 02/18/2022 1410     Risk Assessment/Calculations:           Physical Exam:    VS:  BP 108/76 (BP Location: Right Arm, Patient Position: Sitting)   Pulse 60   Wt 233 lb 6.4 oz (105.9 kg)   SpO2 94%   BMI 32.55 kg/m        Wt Readings from Last 3 Encounters:  05/24/22 233 lb 6.4 oz (105.9 kg)  04/17/22 227 lb 3.2 oz (103.1 kg)  02/18/22 231 lb (104.8 kg)     GEN:  Well nourished, well developed in no acute distress HEENT: Normal NECK: No JVD; No carotid bruits LYMPHATICS: No lymphadenopathy CARDIAC: RRR, no murmurs, rubs, gallops RESPIRATORY:  Clear to auscultation without  rales, wheezing or rhonchi  ABDOMEN:  Soft, non-tender, non-distended MUSCULOSKELETAL:  No edema; No deformity  SKIN: Warm and dry NEUROLOGIC:  Alert and oriented x 3 PSYCHIATRIC:  Normal affect   ASSESSMENT:    1. Coronary artery disease involving native coronary artery of native heart without angina pectoris   2. Hypertension, unspecified type   3. Hyperlipidemia LDL goal <70    PLAN:    In order of problems listed above:  CAD: Denies any recent chest pain.  On aspirin, fenofibrate, Vascepa, and Crestor.  Hypertension: Blood pressure stable  Hyperlipidemia: On Crestor, fenofibrate and Vascepa           Medication Adjustments/Labs and Tests Ordered: Current medicines are reviewed at length with the patient today.  Concerns regarding medicines are outlined above.  No orders of the defined types were placed in this encounter.  No orders of the defined types were placed in this encounter.   Patient Instructions  Medication Instructions:  Your physician recommends that you continue on your current medications as directed. Please refer to the Current Medication list given to you today.  *If you need a refill on your cardiac medications before your next appointment, please call your pharmacy*  Lab Work: NONE ordered at this time of appointment   If you have labs (blood work) drawn today and your tests are completely normal, you will receive your results only by: [place] (if you have MyChart) OR A paper copy in the mail If you have any lab test that is abnormal or we need to change your treatment, we will call you to review the results.  Testing/Procedures: NONE ordered at this time of appointment   Follow-Up: At [facility], you and your health needs are our priority.  As part of our continuing mission to provide you with exceptional heart care, we have created designated Provider Care Teams.  These Care Teams include your primary Cardiologist  (physician) and Advanced Practice Providers (APPs -  Physician Assistants and Nurse Practitioners) who all work together to provide you with the care you need, when you need it.   Your next appointment:   1 year(s)  The format for your next appointment:   In Person  Provider:   Pixie Casino, MD     Other Instructions  Important Information About Sugar         Hilbert Corrigan, [place]  05/26/2022 11:27 PM    [place]

## 2022-05-26 ENCOUNTER — Encounter: Payer: Self-pay | Admitting: Physician Assistant

## 2022-06-18 ENCOUNTER — Ambulatory Visit (INDEPENDENT_AMBULATORY_CARE_PROVIDER_SITE_OTHER): Payer: Medicare HMO

## 2022-06-18 VITALS — Wt 233.0 lb

## 2022-06-18 DIAGNOSIS — Z0000 Encounter for general adult medical examination without abnormal findings: Secondary | ICD-10-CM | POA: Diagnosis not present

## 2022-06-18 NOTE — Patient Instructions (Signed)
Charles Villa , Thank you for taking time to come for your Medicare Wellness Visit. I appreciate your ongoing commitment to your health goals. Please review the following plan we discussed and let me know if I can assist you in the future.   These are the goals we discussed:  Goals      Lose 20 lbs this year.      Patient Stated     Lose 10- 15 lbs      Patient Stated     Lose weight      Charles Villa (see longitudinal plan of care for additional care plan information)  Current Barriers:  Chronic Disease Management support, education, and care coordination needs related to Hypertension, Hyperlipidemia, and GERD   Hypertension BP Readings from Last 3 Encounters:  04/19/20 102/70  03/30/20 95/62  11/15/19 102/64  Pharmacist Clinical Goal(s): Over the next 180 days, patient will work with PharmD and providers to maintain BP goal <130/80 Current regimen:  Bisoprolol-hydrochlorothiazide 2.5-6.25 mg once daily  Interventions: Diet/exercise recommendations Patient self care activities - Over the next 180 days, patient will: Utilize fitness benefit through [email or web address] and start exercising at the local [employer] 2-3x/week Check BP as directed, document, and provide at future appointments Ensure daily salt intake < 2300 mg/day  Hyperlipidemia Lab Results  Component Value Date/Time   LDLCALC 70 02/05/2019 10:08 AM   LDLDIRECT 55.0 11/15/2019 10:32 AM  Pharmacist Clinical Goal(s): Over the next 180 days, patient will work with PharmD and providers to maintain LDL goal < 70 Current regimen:  Rosuvastatin 40 mg once daily Vascepa - 2 grams every morning, 1 gram every evening Interventions: Diet/exercise recommendations - see BP section Explore eligibility for healthwell high cholesterol grant - apply as appropriate  Patient self care activities - Over the next 180 days, patient will: Continue current management  GERD Pharmacist Clinical Goal(s) Over  the next 180 days, patient will work with PharmD and providers to minimize acid reflux symptoms Current regimen:  Pantoprazole 40 mg once daily Interventions: Discussed potential acid reflux triggers - restrict daily use of orange juice as possible Patient self care activities - Over the next 180 days, patient will: Continue to be mindful of acid-reflux triggers, avoid where possible  Medication management Pharmacist Clinical Goal(s): Over the next 180 days, patient will work with PharmD and providers to achieve optimal medication adherence Current pharmacy: [facility] Interventions Comprehensive medication review performed. Continue current medication management strategy Patient self care activities - Over the next 180 days, patient will: Take medications as prescribed Report any questions or concerns to PharmD and/or provider(s)  Initial goal documentation      Track and Manage My Blood Pressure-Hypertension     Timeframe:  Long-Range Goal Priority:  High Start Date: 04/17/21                            Expected End Date:  10/15/21                     Follow Up Date 07/17/21    - check blood pressure weekly    Why is this important?   You won't feel high blood pressure, but it can still hurt your blood vessels.  High blood pressure can cause heart or kidney problems. It can also cause a stroke.  Making lifestyle changes like losing a little weight or eating less salt will  help.  Checking your blood pressure at home and at different times of the day can help to control blood pressure.  If the doctor prescribes medicine remember to take it the way the doctor ordered.  Call the office if you cannot afford the medicine or if there are questions about it.     Notes:      Weight (lb) < 200 lb (90.7 kg)     Eat lighter.         This is a list of the screening recommended for you and due dates:  Health Maintenance  Topic Date Due   Zoster (Shingles) Vaccine (1 of 2)  Never done   Flu Shot  02/26/2022   COVID-19 Vaccine (5 - 2023-24 season) 03/29/2022   Colon Cancer Screening  10/21/2022   Medicare Annual Wellness Visit  06/19/2023   Pneumonia Vaccine  Completed   Hepatitis C Screening: USPSTF Recommendation to screen - Ages 18-79 yo.  Completed   HPV Vaccine  Aged Out    Advanced directives: Please bring a copy of your health care power of attorney and living will to the office at your convenience.  Conditions/risks identified: lose weight   Next appointment: Follow up in one year for your annual wellness visit.   Preventive Care 30 Years and Older, Male  Preventive care refers to lifestyle choices and visits with your health care provider that can promote health and wellness. What does preventive care include? A yearly physical exam. This is also called an annual well check. Dental exams once or twice a year. Routine eye exams. Ask your health care provider how often you should have your eyes checked. Personal lifestyle choices, including: Daily care of your teeth and gums. Regular physical activity. Eating a healthy diet. Avoiding tobacco and drug use. Limiting alcohol use. Practicing safe sex. Taking low doses of aspirin every day. Taking vitamin and mineral supplements as recommended by your health care provider. What happens during an annual well check? The services and screenings done by your health care provider during your annual well check will depend on your age, overall health, lifestyle risk factors, and family history of disease. Counseling  Your health care provider may ask you questions about your: Alcohol use. Tobacco use. Drug use. Emotional well-being. Home and relationship well-being. Sexual activity. Eating habits. History of falls. Memory and ability to understand (cognition). Work and work Statistician. Screening  You may have the following tests or measurements: Height, weight, and BMI. Blood pressure. Lipid  and cholesterol levels. These may be checked every 5 years, or more frequently if you are over 45 years old. Skin check. Lung cancer screening. You may have this screening every year starting at age 29 if you have a 30-pack-year history of smoking and currently smoke or have quit within the past 15 years. Fecal occult blood test (FOBT) of the stool. You may have this test every year starting at age 49. Flexible sigmoidoscopy or colonoscopy. You may have a sigmoidoscopy every 5 years or a colonoscopy every 10 years starting at age 82. Prostate cancer screening. Recommendations will vary depending on your family history and other risks. Hepatitis C blood test. Hepatitis B blood test. Sexually transmitted disease (STD) testing. Diabetes screening. This is done by checking your blood sugar (glucose) after you have not eaten for a while (fasting). You may have this done every 1-3 years. Abdominal aortic aneurysm (AAA) screening. You may need this if you are a current or former smoker. Osteoporosis. You  may be screened starting at age 74 if you are at high risk. Talk with your health care provider about your test results, treatment options, and if necessary, the need for more tests. Vaccines  Your health care provider may recommend certain vaccines, such as: Influenza vaccine. This is recommended every year. Tetanus, diphtheria, and acellular pertussis (Tdap, Td) vaccine. You may need a Td booster every 10 years. Zoster vaccine. You may need this after age 46. Pneumococcal 13-valent conjugate (PCV13) vaccine. One dose is recommended after age 62. Pneumococcal polysaccharide (PPSV23) vaccine. One dose is recommended after age 6. Talk to your health care provider about which screenings and vaccines you need and how often you need them. This information is not intended to replace advice given to you by your health care provider. Make sure you discuss any questions you have with your health care  provider. Document Released: 08/11/2015 Document Revised: 04/03/2016 Document Reviewed: 05/16/2015 Elsevier Interactive Patient Education  2017 [place] Prevention in the Home Falls can cause injuries. They can happen to people of all ages. There are many things you can do to make your home safe and to help prevent falls. What can I do on the outside of my home? Regularly fix the edges of walkways and driveways and fix any cracks. Remove anything that might make you trip as you walk through a door, such as a raised step or threshold. Trim any bushes or trees on the path to your home. Use bright outdoor lighting. Clear any walking paths of anything that might make someone trip, such as rocks or tools. Regularly check to see if handrails are loose or broken. Make sure that both sides of any steps have handrails. Any raised decks and porches should have guardrails on the edges. Have any leaves, snow, or ice cleared regularly. Use sand or salt on walking paths during winter. Clean up any spills in your garage right away. This includes oil or grease spills. What can I do in the bathroom? Use night lights. Install grab bars by the toilet and in the tub and shower. Do not use towel bars as grab bars. Use non-skid mats or decals in the tub or shower. If you need to sit down in the shower, use a plastic, non-slip stool. Keep the floor dry. Clean up any water that spills on the floor as soon as it happens. Remove soap buildup in the tub or shower regularly. Attach bath mats securely with double-sided non-slip rug tape. Do not have throw rugs and other things on the floor that can make you trip. What can I do in the bedroom? Use night lights. Make sure that you have a light by your bed that is easy to reach. Do not use any sheets or blankets that are too big for your bed. They should not hang down onto the floor. Have a firm chair that has side arms. You can use this for support while  you get dressed. Do not have throw rugs and other things on the floor that can make you trip. What can I do in the kitchen? Clean up any spills right away. Avoid walking on wet floors. Keep items that you use a lot in easy-to-reach places. If you need to reach something above you, use a strong step stool that has a grab bar. Keep electrical cords out of the way. Do not use floor polish or wax that makes floors slippery. If you must use wax, use non-skid floor wax. Do  not have throw rugs and other things on the floor that can make you trip. What can I do with my stairs? Do not leave any items on the stairs. Make sure that there are handrails on both sides of the stairs and use them. Fix handrails that are broken or loose. Make sure that handrails are as long as the stairways. Check any carpeting to make sure that it is firmly attached to the stairs. Fix any carpet that is loose or worn. Avoid having throw rugs at the top or bottom of the stairs. If you do have throw rugs, attach them to the floor with carpet tape. Make sure that you have a light switch at the top of the stairs and the bottom of the stairs. If you do not have them, ask someone to add them for you. What else can I do to help prevent falls? Wear shoes that: Do not have high heels. Have rubber bottoms. Are comfortable and fit you well. Are closed at the toe. Do not wear sandals. If you use a stepladder: Make sure that it is fully opened. Do not climb a closed stepladder. Make sure that both sides of the stepladder are locked into place. Ask someone to hold it for you, if possible. Clearly mark and make sure that you can see: Any grab bars or handrails. First and last steps. Where the edge of each step is. Use tools that help you move around (mobility aids) if they are needed. These include: Canes. Walkers. Scooters. Crutches. Turn on the lights when you go into a dark area. Replace any light bulbs as soon as they burn  out. Set up your furniture so you have a clear path. Avoid moving your furniture around. If any of your floors are uneven, fix them. If there are any pets around you, be aware of where they are. Review your medicines with your doctor. Some medicines can make you feel dizzy. This can increase your chance of falling. Ask your doctor what other things that you can do to help prevent falls. This information is not intended to replace advice given to you by your health care provider. Make sure you discuss any questions you have with your health care provider. Document Released: 05/11/2009 Document Revised: 12/21/2015 Document Reviewed: 08/19/2014 Elsevier Interactive Patient Education  2017 [employer].

## 2022-06-18 NOTE — Progress Notes (Addendum)
I connected with  Charles Villa on 06/18/22 by a audio enabled telemedicine application and verified that I am speaking with the correct person using two identifiers.  Patient Location: Home  Provider Location: Office/Clinic  I discussed the limitations of evaluation and management by telemedicine. The patient expressed understanding and agreed to proceed.   Subjective:   Charles Villa is a 76 y.o. male who presents for Medicare Annual/Subsequent preventive examination.  Review of Systems     Cardiac Risk Factors include: advanced age (>38mn, >>27women);dyslipidemia;male gender;hypertension;obesity (BMI >30kg/m2)     Objective:    Today's Vitals   06/18/22 1035  Weight: 233 lb (105.7 kg)   Body mass index is 32.5 kg/m.     06/18/2022   10:39 AM 02/11/2020   11:17 AM 02/04/2018    1:29 PM 02/24/2017    9:11 AM 02/03/2017    1:31 PM 10/23/2015    8:50 AM  Advanced Directives  Does Patient Have a Medical Advance Directive? Yes Yes No Yes Yes Yes  Type of [place]aramed[place]of [place] will [place] will  [place] will   Does patient want to make changes to medical advance directive?    No - Patient declined No - Patient declined   Copy of [place] Chart? No - copy requested No - copy requested  No - copy requested No - copy requested   Would patient like information on creating a medical advance directive?   No - Patient declined       Current Medications (verified) Outpatient Encounter Medications as of 06/18/2022  Medication Sig   aspirin EC 81 MG tablet Take 81 mg by mouth daily.   bisoprolol-hydrochlorothiazide (ZIAC) 2.5-6.25 MG tablet TAKE 1 TABLET BY MOUTH EVERY DAY (Patient taking differently: Take 0.5 tablets by mouth daily.)   clobetasol (TEMOVATE) 0.05 % external solution Apply 1 application topically at bedtime as needed  (scalp).    clobetasol cream (TEMOVATE) 08.46% Apply 1 application topically 2 (two) times daily as needed (abdomen).    clotrimazole-betamethasone (LOTRISONE) cream Apply 1 application topically 2 (two) times daily as needed. psoriass   desonide (DESONATE) 0.05 % gel Apply 1 application  topically 2 (two) times daily as needed. Skin lesions   fenofibrate 160 MG tablet Take 1 tablet (160 mg total) by mouth daily.   fluocinolone (VANOS) 0.01 % cream Apply 1 application topically daily as needed.    hydrocortisone (ANUSOL-HC) 2.5 % rectal cream Place 1 application rectally 2 (two) times daily as needed for hemorrhoids or anal itching.   hydrocortisone 2.5 % cream Apply 1 application topically 2 (two) times daily as needed (face).    icosapent Ethyl (VASCEPA) 1 g capsule TAKE 2 CAPSULES BY MOUTH TWO TIMES A DAY   pantoprazole (PROTONIX) 40 MG tablet TAKE 1 TABLET BY MOUTH EVERY DAY (Patient taking differently: Take 40 mg by mouth every other day.)   rosuvastatin (CRESTOR) 40 MG tablet TAKE 1 TABLET BY MOUTH EVERY DAY   silver sulfADIAZINE (SILVADENE) 1 % cream Apply 1 application topically 2 (two) times daily as needed (groin).    No facility-administered encounter medications on file as of 06/18/2022.    Allergies (verified) Patient has no known allergies.   History: Past Medical History:  Diagnosis Date   Adenomatous colon polyp 11/02/2019   Anal fissure 08/17/2012   Really more gluteal crease irritation- protosol hc 2.5% prn  BACK PAIN, CHRONIC 02/10/2007   CAD (coronary artery disease), native coronary artery    02/24/17-PCI/DES to mLcx, normal EF.   Diverticulosis of colon    2007   GERD 02/23/2007   GERD (gastroesophageal reflux disease)    Hyperlipidemia    Hypertension    OCD (obsessive compulsive disorder) 01/23/2016   Ocd/hoarding issues- psychiatristis- [place] started on zoloft '100mg'$ --> paxil   Personal history of colonic polyps 03/14/2014   08/2005. Benign. 10 year repeat. 2010  colonoscopy in record was entered erroneously.     PSORIASIS 02/10/2007   Topical therapy.      Scalp psoriasis    SQUAMOUS CELL CARCINOMA OF SKIN SITE UNSPECIFIED 08/24/2010   Annotation: face    Past Surgical History:  Procedure Laterality Date   COLONOSCOPY     COLONOSCOPY W/ POLYPECTOMY     2007 [name]   CORONARY STENT INTERVENTION N/A 02/24/2017   Procedure: Coronary Stent Intervention;  Surgeon: Leonie Man, MD;  Location: [facility];  Service: Cardiovascular;  Laterality: N/A;   LEFT HEART CATH AND CORONARY ANGIOGRAPHY N/A 02/24/2017   Procedure: Left Heart Cath and Coronary Angiography;  Surgeon: Leonie Man, MD;  Location: [facility];  Service: Cardiovascular;  Laterality: N/A;   POLYPECTOMY     SKIN CANCER EXCISION     squamous cell CA - nose   TONSILLECTOMY     Family History  Problem Relation Age of Onset   Lung cancer Father    Stroke Mother    Hypertension Mother    Colon polyps Neg Hx    Colon cancer Neg Hx    Esophageal cancer Neg Hx    Rectal cancer Neg Hx    Stomach cancer Neg Hx    Social History   Socioeconomic History   Marital status: Married    Spouse name: Not on file   Number of children: 2   Years of education: Not on file   Highest education level: Not on file  Occupational History   Occupation: Education officer, community: UNEMPLOYED   Occupation: retired  Tobacco Use   Smoking status: Never   Smokeless tobacco: Never  Vaping Use   Vaping Use: Never used  Substance and Sexual Activity   Alcohol use: Yes    Alcohol/week: 21.0 standard drinks of alcohol    Types: 21 Standard drinks or equivalent per week    Comment: 2-3 per day (gin)   Drug use: No   Sexual activity: Not on file  Other Topics Concern   Not on file  Social History Narrative   Married 42 years in 2015. 2 kids, 1 grandchild that is 69.58 years old.       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      Hobbies: ride bike,  ski in winter    Social Determinants of Health   Financial Resource Strain: Low Risk  (06/18/2022)   Overall Financial Resource Strain (CARDIA)    Difficulty of Paying Living Expenses: Not hard at all  Food Insecurity: No Food Insecurity (06/18/2022)   Hunger Vital Sign    Worried About Running Out of Food in the Last Year: Never true    [place] in the Last Year: Never true  Transportation Needs: No Transportation Needs (06/18/2022)   PRAPARE - Hydrologist (Medical): No    Lack of Transportation (Non-Medical): No  Physical Activity: Inactive (06/18/2022)  Exercise Vital Sign    Days of Exercise per Week: 0 days    Minutes of Exercise per Session: 0 min  Stress: Stress Concern Present (06/18/2022)   [place]    Feeling of Stress : To some extent  Social Connections: Moderately Integrated (06/18/2022)   Social Connection and Isolation Panel [NHANES]    Frequency of Communication with Friends and Family: Once a week    Frequency of Social Gatherings with Friends and Family: More than three times a week    Attends Religious Services: More than 4 times per year    Active Member of [employer] or Organizations: No    Attends Music therapist: Never    Marital Status: Married    Tobacco Counseling Counseling given: Not Answered   Clinical Intake:  Pre-visit preparation completed: Yes  Pain : No/denies pain     BMI - recorded: 32.5 Nutritional Status: BMI > 30  Obese Nutritional Risks: None Diabetes: No  How often do you need to have someone help you when you read instructions, pamphlets, or other written materials from your doctor or pharmacy?: 1 - Never  Diabetic?no  Interpreter Needed?: No  Information entered by :: Charlott Rakes, LPN   Activities of Daily Living    06/18/2022   10:40 AM  In your present state of health, do you have any difficulty  performing the following activities:  Hearing? 0  Vision? 0  Difficulty concentrating or making decisions? 0  Walking or climbing stairs? 0  Dressing or bathing? 0  Doing errands, shopping? 0  Preparing Food and eating ? N  Using the Toilet? N  In the past six months, have you accidently leaked urine? N  Do you have problems with loss of bowel control? N  Managing your Medications? N  Managing your Finances? N  Housekeeping or managing your Housekeeping? N    Patient Care Team: Marin Olp, MD as PCP - General (Family Medicine) Debara Pickett Nadean Corwin, MD as PCP - Cardiology (Cardiology) Debara Pickett Nadean Corwin, MD as Consulting Physician (Cardiology) Carmela Rima, DMD as Consulting Physician (Dentistry) Edythe Clarity, [facility] (Pharmacist)  Indicate any recent Medical Services you may have received from other than [facility] providers in the past year (date may be approximate).     Assessment:   This is a routine wellness examination for [place].  Hearing/Vision screen Hearing Screening - Comments:: Pt denies any hearing issues  Vision Screening - Comments:: Pt follows up with [facility] for annul eye exams   Dietary issues and exercise activities discussed: Current Exercise Habits: The patient does not participate in regular exercise at present   Goals Addressed             This Visit's Progress    Patient Stated       Lose weight        Depression Screen    06/18/2022   10:37 AM 08/15/2021   10:56 AM 02/06/2021   10:00 AM 08/08/2020    9:50 AM 02/11/2020   11:15 AM 11/15/2019    9:58 AM 05/06/2019    9:43 AM  PHQ 2/9 Scores  PHQ - 2 Score 0 0 0 0 0 0 0  PHQ- 9 Score  0    2 1    Fall Risk    06/18/2022   10:40 AM 08/15/2021   10:57 AM 02/06/2021   10:00 AM 08/08/2020    9:50 AM 02/11/2020  11:19 AM  Fall Risk   Falls in the past year? 0 0 0 0 0  Number falls in past yr: 0 0 0 0 0  Injury with Fall? 0 0 0 0 0  Risk for fall due to : Impaired vision;Impaired  balance/gait  No Fall Risks  Impaired vision;Impaired balance/gait  Follow up Falls prevention discussed  Falls evaluation completed  Falls prevention discussed    FALL RISK PREVENTION PERTAINING TO THE HOME:  Any stairs in or around the home? Yes  If so, are there any without handrails? No  Home free of loose throw rugs in walkways, pet beds, electrical cords, etc? Yes  Adequate lighting in your home to reduce risk of falls? Yes   ASSISTIVE DEVICES UTILIZED TO PREVENT FALLS:  Life alert? No  Use of a cane, walker or w/c? No  Grab bars in the bathroom? Yes  Shower chair or bench in shower? No  Elevated toilet seat or a handicapped toilet? No   TIMED UP AND GO:  Was the test performed? No .  Cognitive Function:        06/18/2022   10:40 AM 02/11/2020   11:22 AM  6CIT Screen  What Year? 0 points 0 points  What month? 0 points 0 points  What time? 0 points 0 points  Count back from 20 0 points 0 points  Months in reverse 0 points 0 points  Repeat phrase 0 points 0 points  Total Score 0 points 0 points    Immunizations Immunization History  Administered Date(s) Administered   DTP 08/10/2007   Fluad Quad(high Dose 65+) 05/06/2019   Influenza Split 06/08/2012   Influenza Whole 08/21/2009   Influenza, High Dose Seasonal PF 05/12/2013, 05/06/2016, 05/05/2018   Influenza, Seasonal, Injecte, Preservative Fre 05/23/2013   Influenza,inj,Quad PF,6+ Mos 07/12/2020   Influenza-Unspecified 05/01/2015   PFIZER Comirnaty(Gray Top)Covid-19 Tri-Sucrose Vaccine 11/30/2020   PFIZER(Purple Top)SARS-COV-2 Vaccination 09/03/2019, 09/24/2019   Pfizer Covid-19 Vaccine Bivalent Booster 64yr & up 08/23/2021   Pneumococcal Conjugate-13 08/30/2013   Pneumococcal Polysaccharide-23 03/11/2014   Td 07/29/1998, 08/15/2008   Tdap 10/07/2016    TDAP status: Up to date  Flu Vaccine status: Due, Education has been provided regarding the importance of this vaccine. Advised may receive this  vaccine at local pharmacy or Health Dept. Aware to provide a copy of the vaccination record if obtained from local pharmacy or Health Dept. Verbalized acceptance and understanding.  Pneumococcal vaccine status: Up to date  Covid-19 vaccine status: Completed vaccines  Qualifies for Shingles Vaccine? Yes   Zostavax completed No   Shingrix Completed?: No.    Education has been provided regarding the importance of this vaccine. Patient has been advised to call insurance company to determine out of pocket expense if they have not yet received this vaccine. Advised may also receive vaccine at local pharmacy or Health Dept. Verbalized acceptance and understanding.  Screening Tests Health Maintenance  Topic Date Due   Zoster Vaccines- Shingrix (1 of 2) Never done   INFLUENZA VACCINE  02/26/2022   COVID-19 Vaccine (5 - 2023-24 season) 03/29/2022   COLONOSCOPY (Pts 45-441yrInsurance coverage will need to be confirmed)  10/21/2022   Medicare Annual Wellness (AWV)  06/19/2023   Pneumonia Vaccine 76Years old  Completed   Hepatitis C Screening  Completed   HPV VACCINES  Aged Out    Health Maintenance  Health Maintenance Due  Topic Date Due   Zoster Vaccines- Shingrix (1 of 2) Never done  INFLUENZA VACCINE  02/26/2022   COVID-19 Vaccine (5 - 2023-24 season) 03/29/2022    Colorectal cancer screening: Type of screening: Colonoscopy. Completed 10/21/19. Repeat every 3 years   Additional Screening:  Hepatitis C Screening:  Completed 03/05/18  Vision Screening: Recommended annual ophthalmology exams for early detection of glaucoma and other disorders of the eye. Is the patient up to date with their annual eye exam?  Yes  Who is the provider or what is the name of the office in which the patient attends annual eye exams? [facility]  If pt is not established with a provider, would they like to be referred to a provider to establish care? No .   Dental Screening: Recommended annual dental  exams for proper oral hygiene  Community Resource Referral / Chronic Care Management: CRR required this visit?  No   CCM required this visit?  No      Plan:     I have personally reviewed and noted the following in the patient's chart:   Medical and social history Use of alcohol, tobacco or illicit drugs  Current medications and supplements including opioid prescriptions. Patient is not currently taking opioid prescriptions. Functional ability and status Nutritional status Physical activity Advanced directives List of other physicians Hospitalizations, surgeries, and ER visits in previous 12 months Vitals Screenings to include cognitive, depression, and falls Referrals and appointments  In addition, I have reviewed and discussed with patient certain preventive protocols, quality metrics, and best practice recommendations. A written personalized care plan for preventive services as well as general preventive health recommendations were provided to patient.     Willette Brace, LPN   [id number]   Nurse Notes: none

## 2022-06-23 ENCOUNTER — Other Ambulatory Visit: Payer: Self-pay | Admitting: Family Medicine

## 2022-06-23 ENCOUNTER — Other Ambulatory Visit: Payer: Self-pay | Admitting: Internal Medicine

## 2022-07-11 ENCOUNTER — Encounter: Payer: Self-pay | Admitting: *Deleted

## 2022-07-29 DIAGNOSIS — U071 COVID-19: Secondary | ICD-10-CM | POA: Diagnosis not present

## 2022-08-07 ENCOUNTER — Other Ambulatory Visit: Payer: Self-pay | Admitting: Internal Medicine

## 2022-09-18 ENCOUNTER — Encounter: Payer: Self-pay | Admitting: Gastroenterology

## 2022-09-20 ENCOUNTER — Other Ambulatory Visit: Payer: Self-pay | Admitting: Family Medicine

## 2022-09-24 DIAGNOSIS — H5212 Myopia, left eye: Secondary | ICD-10-CM | POA: Diagnosis not present

## 2022-10-11 DIAGNOSIS — F1020 Alcohol dependence, uncomplicated: Secondary | ICD-10-CM | POA: Diagnosis not present

## 2022-10-21 ENCOUNTER — Ambulatory Visit (INDEPENDENT_AMBULATORY_CARE_PROVIDER_SITE_OTHER): Payer: Medicare HMO | Admitting: Family Medicine

## 2022-10-21 ENCOUNTER — Telehealth: Payer: Self-pay | Admitting: Internal Medicine

## 2022-10-21 ENCOUNTER — Encounter: Payer: Self-pay | Admitting: Family Medicine

## 2022-10-21 VITALS — BP 110/70 | HR 64 | Temp 97.0°F | Ht 71.0 in | Wt 224.5 lb

## 2022-10-21 DIAGNOSIS — I2510 Atherosclerotic heart disease of native coronary artery without angina pectoris: Secondary | ICD-10-CM

## 2022-10-21 DIAGNOSIS — R0609 Other forms of dyspnea: Secondary | ICD-10-CM | POA: Diagnosis not present

## 2022-10-21 DIAGNOSIS — L989 Disorder of the skin and subcutaneous tissue, unspecified: Secondary | ICD-10-CM

## 2022-10-21 DIAGNOSIS — R0989 Other specified symptoms and signs involving the circulatory and respiratory systems: Secondary | ICD-10-CM

## 2022-10-21 DIAGNOSIS — Z9861 Coronary angioplasty status: Secondary | ICD-10-CM | POA: Diagnosis not present

## 2022-10-21 DIAGNOSIS — R531 Weakness: Secondary | ICD-10-CM

## 2022-10-21 DIAGNOSIS — E538 Deficiency of other specified B group vitamins: Secondary | ICD-10-CM | POA: Diagnosis not present

## 2022-10-21 LAB — CBC WITH DIFFERENTIAL/PLATELET
Basophils Absolute: 0 10*3/uL (ref 0.0–0.1)
Basophils Relative: 0.6 % (ref 0.0–3.0)
Eosinophils Absolute: 0.1 10*3/uL (ref 0.0–0.7)
Eosinophils Relative: 1.3 % (ref 0.0–5.0)
HCT: 46.3 % (ref 39.0–52.0)
Hemoglobin: 16 g/dL (ref 13.0–17.0)
Lymphocytes Relative: 35.4 % (ref 12.0–46.0)
Lymphs Abs: 2.1 10*3/uL (ref 0.7–4.0)
MCHC: 34.5 g/dL (ref 30.0–36.0)
MCV: 103.8 fl — ABNORMAL HIGH (ref 78.0–100.0)
Monocytes Absolute: 0.5 10*3/uL (ref 0.1–1.0)
Monocytes Relative: 8.2 % (ref 3.0–12.0)
Neutro Abs: 3.2 10*3/uL (ref 1.4–7.7)
Neutrophils Relative %: 54.5 % (ref 43.0–77.0)
Platelets: 178 10*3/uL (ref 150.0–400.0)
RBC: 4.46 Mil/uL (ref 4.22–5.81)
RDW: 13.9 % (ref 11.5–15.5)
WBC: 5.9 10*3/uL (ref 4.0–10.5)

## 2022-10-21 LAB — COMPREHENSIVE METABOLIC PANEL
ALT: 39 U/L (ref 0–53)
AST: 56 U/L — ABNORMAL HIGH (ref 0–37)
Albumin: 4 g/dL (ref 3.5–5.2)
Alkaline Phosphatase: 27 U/L — ABNORMAL LOW (ref 39–117)
BUN: 17 mg/dL (ref 6–23)
CO2: 22 mEq/L (ref 19–32)
Calcium: 9.2 mg/dL (ref 8.4–10.5)
Chloride: 105 mEq/L (ref 96–112)
Creatinine, Ser: 1.35 mg/dL (ref 0.40–1.50)
GFR: 51.56 mL/min — ABNORMAL LOW (ref >60.00)
Glucose, Bld: 108 mg/dL — ABNORMAL HIGH (ref 70–99)
Potassium: 3.7 mEq/L (ref 3.5–5.1)
Sodium: 138 mEq/L (ref 135–145)
Total Bilirubin: 0.8 mg/dL (ref 0.2–1.2)
Total Protein: 6.9 g/dL (ref 6.0–8.3)

## 2022-10-21 LAB — CK: Total CK: 119 U/L (ref 7–232)

## 2022-10-21 NOTE — Progress Notes (Signed)
Subjective:     Patient ID: Charles Villa, male    DOB: 1946-04-21, 77 y.o.   MRN: [id number]  Chief Complaint  Patient presents with   Nasal Congestion    Nasal drainage when waking up in the mornings Use Dayquil and nasal spray    Muscle Weaknes    HPI  Congestion - for months-drippy nose, post nasal drip in am.  Subsides by 10am.  Will take dayquil if not gone by 930 or 10.  Works.  1-3x/wk.  No f/c.  No face pain.  Takes protonix qod.  Occ cough.  Tried to take walk this am and more sob this weekend so sch appt.  No cp but some pain L scapula(constant in background)-not worse w/activity.  Hadn't done that walk for awhile till this weekend    and muscles felt week in legs as well.    Can climb one flight of stairs at home. Ussing afrin.   Did have covid after Christmas.   2.  Sore spot L cheek for 1 mo and not healing. Not bleeding.  H/o psoriasis.  3.  Tenderness R middle finger DIP. Occ ibu.  4.  Taking 1/2 ziac daily as bp was low.  Occ dizzy/lightheaded.  Has periph neuropathy.  Health Maintenance Due  Topic Date Due   COLONOSCOPY (Pts 45-17yrs Insurance coverage will need to be confirmed)  10/21/2022    Past Medical History:  Diagnosis Date   Adenomatous colon polyp 11/02/2019   Anal fissure 08/17/2012   Really more gluteal crease irritation- protosol hc 2.5% prn    BACK PAIN, CHRONIC 02/10/2007   CAD (coronary artery disease), native coronary artery    02/24/17-PCI/DES to mLcx, normal EF.   Diverticulosis of colon    2007   GERD 02/23/2007   GERD (gastroesophageal reflux disease)    Hyperlipidemia    Hypertension    OCD (obsessive compulsive disorder) 01/23/2016   Ocd/hoarding issues- psychiatristis- [place] started on zoloft 100mg --> paxil   Personal history of colonic polyps 03/14/2014   08/2005. Benign. 10 year repeat. 2010 colonoscopy in record was entered erroneously.     PSORIASIS 02/10/2007   Topical therapy.      Scalp psoriasis    SQUAMOUS CELL CARCINOMA OF  SKIN SITE UNSPECIFIED 08/24/2010   Annotation: face     Past Surgical History:  Procedure Laterality Date   COLONOSCOPY     COLONOSCOPY W/ POLYPECTOMY     2007 [name]   CORONARY STENT INTERVENTION N/A 02/24/2017   Procedure: Coronary Stent Intervention;  Surgeon: Leonie Man, MD;  Location: [facility];  Service: Cardiovascular;  Laterality: N/A;   LEFT HEART CATH AND CORONARY ANGIOGRAPHY N/A 02/24/2017   Procedure: Left Heart Cath and Coronary Angiography;  Surgeon: Leonie Man, MD;  Location: [facility];  Service: Cardiovascular;  Laterality: N/A;   POLYPECTOMY     SKIN CANCER EXCISION     squamous cell CA - nose   TONSILLECTOMY      Outpatient Medications Prior to Visit  Medication Sig Dispense Refill   aspirin EC 81 MG tablet Take 81 mg by mouth daily.     bisoprolol-hydrochlorothiazide (ZIAC) 2.5-6.25 MG tablet TAKE 1 TABLET BY MOUTH EVERY DAY 90 tablet 3   clobetasol (TEMOVATE) 0.05 % external solution Apply 1 application topically at bedtime as needed (scalp).   5   clobetasol cream (TEMOVATE) [id number] % Apply 1 application topically 2 (two) times daily as needed (abdomen).  3   clotrimazole-betamethasone (LOTRISONE) cream Apply 1 application topically 2 (two) times daily as needed. psoriass     desonide (DESONATE) 0.05 % gel Apply 1 application  topically 2 (two) times daily as needed. Skin lesions     fenofibrate 160 MG tablet Take 1 tablet (160 mg total) by mouth daily. 90 tablet 3   fluocinolone (VANOS) 0.01 % cream Apply 1 application topically daily as needed.      hydrocortisone (ANUSOL-HC) 2.5 % rectal cream Place 1 application rectally 2 (two) times daily as needed for hemorrhoids or anal itching.     hydrocortisone 2.5 % cream Apply 1 application topically 2 (two) times daily as needed (face).   5   pantoprazole (PROTONIX) 40 MG tablet TAKE 1 TABLET BY MOUTH EVERY DAY 30 tablet 3   rosuvastatin (CRESTOR) 40 MG tablet TAKE 1 TABLET BY MOUTH EVERY  DAY 90 tablet 1   silver sulfADIAZINE (SILVADENE) 1 % cream Apply 1 application topically 2 (two) times daily as needed (groin).   5   VASCEPA 1 g capsule TAKE 2 CAPSULES BY MOUTH TWO TIMES A DAY. 120 capsule 5   No facility-administered medications prior to visit.    No Known Allergies ROS neg/noncontributory except as noted HPI/below Some loss of appetite - GI issues intermitt- nausea, some vomiting.  Coughed up phlegm.  Just end of last wk.  Wife ill as well.        Objective:     BP 110/70   Pulse 64   Temp (!) 97 F (36.1 C) (Temporal)   Ht 5\' 11"  (1.803 m)   Wt 224 lb 8 oz (101.8 kg)   SpO2 95%   BMI 31.31 kg/m  Wt Readings from Last 3 Encounters:  10/21/22 224 lb 8 oz (101.8 kg)  06/18/22 233 lb (105.7 kg)  05/24/22 233 lb 6.4 oz (105.9 kg)    Physical Exam   Gen: WDWN NAD HEENT: NCAT, conjunctiva not injected, sclera nonicteric TM WNL B, OP moist, no exudates  NECK:  supple, no thyromegaly, no nodes, no carotid bruits CARDIAC: RRR, S1S2+, no murmur. DP 1+B LUNGS: CTAB. No wheezes ABDOMEN:  BS+, soft, NTND, No HSM, no masses EXT:  no edema MSK: no gross abnormalities.  Ms 5/5 all 4 NEURO: A&O x3.  CN II-XII intact.  Hand remors PSYCH: normal mood. Good eye contact  Some flakey spots on pink base L ear, temple, scalp.  Psoriasis abd.      Assessment & Plan:   Problem List Items Addressed This Visit       Cardiovascular and Mediastinum   CAD S/P percutaneous coronary angioplasty   Other Visit Diagnoses     Weakness    -  Primary   Relevant Orders   Comprehensive metabolic panel   TSH   Vitamin B12   CBC with Differential/Platelet   CK   Dyspnea on exertion       Relevant Orders   Comprehensive metabolic panel   CBC with Differential/Platelet   Vitamin B12 deficiency       Relevant Orders   Vitamin B12   Runny nose       Skin lesion          Nasal congestion-?allergic rhinitis, other.  Claritin 10mg  daily and flonase.  Stop dayquil and  afrin(CAD/age).  If not improving, f/u H/o B12 def and neuropathy-check B12 Weakness in legs w/exercise-hadn't done walking for "long time" and then tried-?cardiac, deconditioning, statin, other.  Check cbc,cmp,tsh,CK,B12 DOE-?deconditioning,  CAD, other-advised to f/u Card ASAP Skin lesions-advised to f/u Derm-?SK, skin ca, psoriasis CAD-no angina.  Some new DOE-but hasn't been active and then tried.  See Card  No orders of the defined types were placed in this encounter.   Wellington Hampshire, MD

## 2022-10-21 NOTE — Telephone Encounter (Signed)
Pt c/o Shortness Of Breath: STAT if SOB developed within the last 24 hours or pt is noticeably SOB on the phone  1. Are you currently SOB (can you hear that pt is SOB on the phone)? no  2. How long have you been experiencing SOB? Started over the weekend. Took a long walk this weekend, having trouble going up the inclines.   3. Are you SOB when sitting or when up moving around? SOB is up when he is moving around.   4. Are you currently experiencing any other symptoms? States he is having muscles weakness, and fatigue. This has been since the end of last year around the holidays.  He saw family doctor today and they recommended he follow up with [place].  He is currently scheduled for 4/5 with APP.

## 2022-10-21 NOTE — Patient Instructions (Addendum)
Flonase daily and claritin 10mg  daily.    See Derm See Card.    Small exercise.

## 2022-10-21 NOTE — Telephone Encounter (Signed)
Spoke with patient of Dr. Debara Pickett. He reports decreased appetite, no weight gain, no swelling. He is unable to make an appt on 4/1 with MD that was offered. He will keep his 4/5 appt. He reports SOB that is new over the wkend but fatigue since end of 2023 - had COVID.   He is going out of town to dog-sit so cannot come in 4/1. Advised to seek urgent care or ED eval for worsening symptoms.

## 2022-10-22 LAB — TSH: TSH: 1.36 u[IU]/mL (ref 0.35–5.50)

## 2022-10-22 LAB — VITAMIN B12: Vitamin B-12: 310 pg/mL (ref 211–911)

## 2022-10-22 NOTE — Progress Notes (Signed)
1.  Liver test was slightly elevated-needs to stop drinking alcohol. 2.  Vitamin B12 is on the low end of normal.  He needs to take B12 vitamins over-the-counter. 3.  Rest of the labs look okay

## 2022-10-24 ENCOUNTER — Encounter: Payer: Self-pay | Admitting: Family Medicine

## 2022-10-30 NOTE — Progress Notes (Deleted)
   Cardiology Clinic Note   Date: 10/30/2022 ID: Calyb, Simonson May 05, 1946, MRN [id number]  Primary Cardiologist:  Pixie Casino, MD  Patient Profile    Charles Villa is a 77 y.o. male who presents to the clinic today for valuation of shortness of breath.  Past medical history significant for: CAD. LHC 02/24/2017 (abnormal stress test): Mid LCx 90% moderate disease ostial and mid LAD with proximal mid ectasia.  PCI with DES to mid LCx. Echo 04/26/2022: EF 60 to 65%.  Grade I DD. Hypertension. Hyperlipidemia. Direct LDL 02/18/2022: 60. GERD. Alcohol use.   History of Present Illness    Charles Villa was first evaluated by Dr. Debara Pickett on 02/03/2017 for shortness of breath at the request of Dr. Yong Channel.  Patient underwent evaluation with echo and exercise stress test.  Echo showed normal LV function with Grade I DD.  Exercise stress test was abnormal with ST segment depression during exercise consistent with ischemia.  Patient underwent PCI with DES to LCx.  Last seen in the office by Almyra Deforest, PA-C on 05/24/2022.  At that time he was doing well and no medication changes were made.  Most recently, patient called into triage on 10/21/2022 with complaints of shortness of breath that was new and increased fatigue since the end of 2023 when he had COVID.  Labs drawn by PCP showed normal kidney function, electrolytes, hemoglobin.  No signs of infection. Abnormal LFTs.    Today, patient ***  CAD.  S/p PCI with DES to mid Cx.  Patient*** Continue Crestor, Vascepa, Ziac, aspirin. Hypertension.  BP today*** Patient denies headaches or dizziness.  Continue Ziac. Hyperlipidemia.  Direct LDL July 2023 60, at goal.  Continue rosuvastatin and Vascepa. Alcohol use.***   ROS: All other systems reviewed and are otherwise negative except as noted in History of Present Illness.  Studies Reviewed    ECG personally reviewed by me today: ***  No significant changes from ***  Risk  Assessment/Calculations    {Does this patient have ATRIAL FIBRILLATION?:[phone]} No BP recorded.  {Refresh Note OR Click here to enter BP  :1}***        Physical Exam    VS:  There were no vitals taken for this visit. , BMI There is no height or weight on file to calculate BMI.  GEN: Well nourished, well developed, in no acute distress. Neck: No JVD or carotid bruits. Cardiac: *** RRR. No murmurs. No rubs or gallops.   Respiratory:  Respirations regular and unlabored. Clear to auscultation without rales, wheezing or rhonchi. GI: Soft, nontender, nondistended. Extremities: Radials/DP/PT 2+ and equal bilaterally. No clubbing or cyanosis. No edema ***  Skin: Warm and dry, no rash. Neuro: Strength intact.  Assessment & Plan   ***  Disposition: ***     {Are you ordering a CV Procedure (e.g. stress test, cath, DCCV, TEE, etc)?   Press F2        :[id number]   Signed, Justice Britain. Sandrine Bloodsworth, DNP, NP-C

## 2022-11-01 ENCOUNTER — Other Ambulatory Visit: Payer: Self-pay | Admitting: Internal Medicine

## 2022-11-01 ENCOUNTER — Ambulatory Visit: Payer: Medicare HMO | Admitting: Student

## 2022-11-01 DIAGNOSIS — F1020 Alcohol dependence, uncomplicated: Secondary | ICD-10-CM | POA: Diagnosis not present

## 2022-11-14 NOTE — Progress Notes (Signed)
Cardiology Clinic Note   Date: 11/15/2022 ID: Zyad, Boomer 1945-09-15, MRN [id number]  Primary Cardiologist:  Chrystie Nose, MD  Patient Profile    Charles Villa is a 77 y.o. male who presents to the clinic today for evaluation of shortness of breath and increased fatigue.  Past medical history significant for: CAD. LHC 02/24/2017: Mid CX 90%.  Moderate disease ostial and mid LAD with proximal to mid ectasia.  PCI with DES 3.5 x 16 to mid Cx. Echo 04/26/2022: EF 60 to 65%.  Grade I DD.  No significant valvular abnormalities. Hypertension. Hyperlipidemia. GERD. EtOH use.    History of Present Illness    Charles Villa was first evaluated by Dr. Rennis Golden on 02/03/2017 for shortness of breath at the request of Dr. Durene Cal.  Patient underwent exercise stress testing which showed ischemia.  He underwent PCI with DES to mid Cx (details above).  Continues to be followed for the above outlined history.  Patient was last seen in the office by Azalee Course, PA-C on 05/24/2022.  At that time he was doing well and no medication changes were made.  Most recently, patient called into triage on 10/21/2022 with complaints of shortness of breath that was new and increased fatigue since the end of 2023 when he had COVID.  Labs drawn by PCP showed normal kidney function, electrolytes, hemoglobin.  No signs of infection. Abnormal LFTs.     Today, patient is alone.  He reports DOE, fatigue, activity intolerance that has worsened over the last several months.  He reports he tested positive for COVID 1 week after Christmas and had marked DOE and fatigue at that time to the point that he could not make it up his stairs at home without stopping to rest and catch his breath.  His symptoms are better from that time but still worse from when he was seen in September 2023.  He noted a decrease in his activity tolerance while attempting to do yard work at home and needing to rest frequently.  He also attempted a  three-quarter mile Loop in his neighborhood that he had previously been able to complete without difficulty and a quarter of the way in he felt dyspneic and fatigued.  He reports same symptoms when he was first evaluated by Dr. Rennis Golden and underwent stress testing and subsequent PCI.  He denies overt chest pain but endorses occasional twinges on the left side of his chest not associated with exertion.  He also reports a sharp pain first thing in the morning that he thinks may be coming from the back of his neck shooting up to his head and by his ear to the side of his face.  Suggest that this may be coming from his neck and to follow with his PCP for this.  He denies lower extremity edema, orthopnea, PND.  Continues to drink 2-3 liquor drinks per day.    ROS: All other systems reviewed and are otherwise negative except as noted in History of Present Illness.  Studies Reviewed    ECG is not ordered today.         Physical Exam    VS:  BP 106/60   Pulse 60   Ht  (1.803 m)   Wt 226 lb 9.6 oz (102.8 kg)   SpO2 96%   BMI 31.60 kg/m  , BMI Body mass index is 31.6 kg/m.  GEN: Well nourished, well developed, in no acute distress. Neck: No JVD or  carotid bruits. Cardiac:  RRR. No murmurs. No rubs or gallops.   Respiratory:  Respirations regular and unlabored. Clear to auscultation without rales, wheezing or rhonchi. GI: Soft, nontender, nondistended. Extremities: Radials/DP/PT 2+ and equal bilaterally. No clubbing or cyanosis. No edema.  Skin: Warm and dry, no rash. Neuro: Strength intact.  Assessment & Plan    CAD/DOE/fatigue.  S/p PCI with DES to mid Cx July 2018.  Echo September 2023 showed normal LV function, Grade I DD.  Patient reports anginal equivalent symptoms of increased DOE, fatigue, activity intolerance for several months.  He had marked symptoms with COVID infection 1 week after Christmas that improved but the symptoms are still worse than they were in September 2023.   Shortness of breath and fatigue were most obvious recently when trying to do yard work and walking a three-quarter mile Loop in his neighborhood.  Will get a nuclear stress test.  He thinks he will be able to walk on the treadmill.  Will check a BMP today.  Continue Crestor, Vascepa, Ziac, aspirin. Hypertension.  BP today 106/60.  Patient denies headaches or dizziness.  Continue Ziac. Hyperlipidemia.  Direct LDL July 2023 60, at goal.  Continue rosuvastatin and Vascepa.  Disposition: BMP today.  Nuclear stress test.  Return in 6 to 8 weeks or sooner as needed.     Shared Decision Making/Informed Consent The risks [chest pain, shortness of breath, cardiac arrhythmias, dizziness, blood pressure fluctuations, myocardial infarction, stroke/transient ischemic attack, nausea, vomiting, allergic reaction, radiation exposure, metallic taste sensation and life-threatening complications (estimated to be 1 in 10,000)], benefits (risk stratification, diagnosing coronary artery disease, treatment guidance) and alternatives of a nuclear stress test were discussed in detail with [name] and he agrees to proceed.   Signed, Etta Grandchild. Charles Marsan, DNP, NP-C

## 2022-11-15 ENCOUNTER — Ambulatory Visit: Payer: Medicare HMO | Attending: Student | Admitting: Student

## 2022-11-15 ENCOUNTER — Encounter: Payer: Self-pay | Admitting: Student

## 2022-11-15 VITALS — BP 106/60 | HR 60 | Ht 71.0 in | Wt 226.6 lb

## 2022-11-15 DIAGNOSIS — E785 Hyperlipidemia, unspecified: Secondary | ICD-10-CM | POA: Diagnosis not present

## 2022-11-15 DIAGNOSIS — R0609 Other forms of dyspnea: Secondary | ICD-10-CM

## 2022-11-15 DIAGNOSIS — I10 Essential (primary) hypertension: Secondary | ICD-10-CM

## 2022-11-15 DIAGNOSIS — Z01812 Encounter for preprocedural laboratory examination: Secondary | ICD-10-CM | POA: Diagnosis not present

## 2022-11-15 DIAGNOSIS — I25118 Atherosclerotic heart disease of native coronary artery with other forms of angina pectoris: Secondary | ICD-10-CM | POA: Diagnosis not present

## 2022-11-15 DIAGNOSIS — R5383 Other fatigue: Secondary | ICD-10-CM | POA: Diagnosis not present

## 2022-11-15 NOTE — Patient Instructions (Signed)
Medication Instructions:  Your physician recommends that you continue on your current medications as directed. Please refer to the Current Medication list given to you today.  *If you need a refill on your cardiac medications before your next appointment, please call your pharmacy*   Lab Work: Your physician recommends that you have the following lab drawn: BMET  If you have labs (blood work) drawn today and your tests are completely normal, you will receive your results only by: MyChart Message (if you have MyChart) OR A paper copy in the mail If you have any lab test that is abnormal or we need to change your treatment, we will call you to review the results.   Testing/Procedures: Your physician has requested that you have a lexiscan myoview. For further information please visit [email or web address] Please follow instruction sheet, as given.    Follow-Up: At [facility], you and your health needs are our priority.  As part of our continuing mission to provide you with exceptional heart care, we have created designated Provider Care Teams.  These Care Teams include your primary Cardiologist (physician) and Advanced Practice Providers (APPs -  Physician Assistants and Nurse Practitioners) who all work together to provide you with the care you need, when you need it.  We recommend signing up for the patient portal called "MyChart".  Sign up information is provided on this After Visit Summary.  MyChart is used to connect with patients for Virtual Visits (Telemedicine).  Patients are able to view lab/test results, encounter notes, upcoming appointments, etc.  Non-urgent messages can be sent to your provider as well.   To learn more about what you can do with MyChart, go to [email or web address].    Your next appointment:   6-8 week(s)  Provider:   Carlos Levering, NP   Other Instructions How to Prepare for Your Myoview Test (stress test):  Please do not take these  medications before your test: Bisoprolol-HCTZ  (please note if this is an exercise test pt should hold beta blocker prior) Your remaining medications may be taken with water. Nothing to eat or drink, except water, 4 hours prior to arrival time.  NO caffeine/decaffeinated products, or chocolate 12 hours prior to arrival. [place], please do not wear dresses.  Skirts or pants are approprate, please wear a short sleeve shirt. NO perfume, cologne or lotion Wear comfortable walking shoes.  NO HEELS! Total time is 3 to 4 hours; you may want to bring reading material for the waiting time. Please report to [facility] for your test  What to expect after you arrive:  Once you arrive and check in for your appointment an IV will be started in your arm.  Then the Technoligist will inject a small amount of radioactive tracer.  There will be a 1 hour waiting period after this injection.  A series of pictures will be taken of your heart following this waiting period.  You will be prepped for the stress portion of the test.  During the stress portion of your test you will either walk on a treadmill or receive a small, safe amount of radioactive tracer injected in your IV.  After the stress portion, there is a short rest period during which time your heart and blood pressure will be monitored.  After the short rest period the Technologist will begin your second set of pictures.  Your doctor will inform you of your test results within 7-10 business days.  In preparation for your appointment, medication and supplies will  be purchased.  Appointment availability is limited, so if you need to cancel or reschedule please call the office at [phone] 24 hours in advance to avoid a cancellation fee of $100.00  IF YOU THINK YOU MAY BE PREGNANT, PLEASE INFORM THE TECHNOLOGIST.

## 2022-11-16 LAB — BASIC METABOLIC PANEL
BUN/Creatinine Ratio: 10 (ref 10–24)
BUN: 14 mg/dL (ref 8–27)
CO2: 21 mmol/L (ref 20–29)
Calcium: 9.2 mg/dL (ref 8.6–10.2)
Chloride: 103 mmol/L (ref 96–106)
Creatinine, Ser: 1.38 mg/dL — ABNORMAL HIGH (ref 0.76–1.27)
Glucose: 106 mg/dL — ABNORMAL HIGH (ref 70–99)
Potassium: 4.1 mmol/L (ref 3.5–5.2)
Sodium: 139 mmol/L (ref 134–144)
eGFR: 53 mL/min/{1.73_m2} — ABNORMAL LOW (ref >59)

## 2022-11-20 ENCOUNTER — Telehealth (HOSPITAL_COMMUNITY): Payer: Self-pay | Admitting: *Deleted

## 2022-11-20 NOTE — Telephone Encounter (Signed)
Patient given detailed instructions per Myocardial Perfusion Study Information Sheet for the test on 11/22/2022 at 1:00. Patient notified to arrive 15 minutes early and that it is imperative to arrive on time for appointment to keep from having the test rescheduled.  If you need to cancel or reschedule your appointment, please call the office within 24 hours of your appointment. . Patient verbalized understanding.Charles Villa

## 2022-11-22 ENCOUNTER — Ambulatory Visit (HOSPITAL_COMMUNITY): Payer: Medicare HMO | Attending: Cardiovascular Disease

## 2022-11-22 DIAGNOSIS — I25118 Atherosclerotic heart disease of native coronary artery with other forms of angina pectoris: Secondary | ICD-10-CM

## 2022-11-22 DIAGNOSIS — F1020 Alcohol dependence, uncomplicated: Secondary | ICD-10-CM | POA: Diagnosis not present

## 2022-11-22 LAB — MYOCARDIAL PERFUSION IMAGING
LV dias vol: 68 mL (ref 62–150)
LV sys vol: 25 mL
Nuc Stress EF: 63 %
Peak HR: 81 {beats}/min
Rest HR: 60 {beats}/min
Rest Nuclear Isotope Dose: 10.9 mCi
SDS: 0
SRS: 0
SSS: 0
ST Depression (mm): 0 mm
Stress Nuclear Isotope Dose: 31.7 mCi
TID: 1.19

## 2022-11-22 MED ORDER — TECHNETIUM TC 99M TETROFOSMIN IV KIT
10.9000 | PACK | Freq: Once | INTRAVENOUS | Status: AC | PRN
  Administered 2022-11-22: 10.9 via INTRAVENOUS

## 2022-11-22 MED ORDER — REGADENOSON 0.4 MG/5ML IV SOLN
0.4000 mg | Freq: Once | INTRAVENOUS | Status: AC
  Administered 2022-11-22: 0.4 mg via INTRAVENOUS

## 2022-11-22 MED ORDER — TECHNETIUM TC 99M TETROFOSMIN IV KIT
31.7000 | PACK | Freq: Once | INTRAVENOUS | Status: AC | PRN
  Administered 2022-11-22: 31.7 via INTRAVENOUS

## 2022-11-25 ENCOUNTER — Telehealth: Payer: Self-pay | Admitting: Internal Medicine

## 2022-11-25 NOTE — Telephone Encounter (Signed)
Patient states he has already received results.  Nothing further needed at this time.

## 2022-11-25 NOTE — Telephone Encounter (Signed)
*  STAT* If patient is at the pharmacy, call can be transferred to refill team.   1. Which medications need to be refilled? (please list name of each medication and dose if known) VASCEPA 1 g capsule   2. Which pharmacy/location (including street and city if local pharmacy) is medication to be sent to? [id number] - [place], [place] - [address] AT [address] OF [address]    3. Do they need a 30 day or 90 day supply? 90 Day Supply

## 2022-11-25 NOTE — Telephone Encounter (Signed)
Patient is returning a call about his Lexiscan results.

## 2022-11-26 ENCOUNTER — Other Ambulatory Visit: Payer: Self-pay

## 2022-11-26 MED ORDER — ICOSAPENT ETHYL 1 G PO CAPS: ORAL_CAPSULE | ORAL | 5 refills | Status: DC

## 2022-11-26 NOTE — Telephone Encounter (Signed)
Called patient to advise medication refill sent to pharmacy °

## 2022-12-26 NOTE — Progress Notes (Signed)
Cardiology Clinic Note   Date: 12/27/2022 ID: Charles, Villa [birth year], MRN [id number]  Primary Cardiologist:  Chrystie Nose, MD  Patient Profile    Charles Villa is a 77 y.o. male who presents to the clinic today for follow up after testing.   Past medical history significant for: CAD. LHC 02/24/2017: Mid CX 90%.  Moderate disease ostial and mid LAD with proximal to mid ectasia.  PCI with DES 3.5 x 16 to mid Cx. Echo 04/26/2022: EF 60 to 65%.  Grade I DD.  No significant valvular abnormalities. Nuclear stress test 11/22/2022: Normal, low risk study. Hypertension. Hyperlipidemia. GERD. EtOH use.    History of Present Illness    Charles Villa was first evaluated by Dr. Rennis Golden on 02/03/2017 for shortness of breath at the request of Dr. Durene Cal. Patient underwent exercise stress testing which showed ischemia. He underwent PCI with DES to mid Cx (details above). Continues to be followed for the above outlined history.   Was last seen in the office by me on 11/15/2022 with complaints of a several month history of progressive DOE, fatigue, activity intolerance.  He feels the symptoms are similar to when he received his stent in 2018.  Nuclear stress test was a normal low risk study.  Today, patient reports continued fatigue. He does not really get short of breath so much as "give out" after a small amount of activity. He used to be able to take care of his yard and now after about 10 minutes he has to stop. Reviewed recent stress test and Echo from September. Provided reassurance. Patient also mentions feeling congested with coughing and sneezing upon awakening and lasting 2-3 hours. He also reports "twinges" of discomfort on the left side of his neck the resolves with ibuprofen. He reports it may also resolve on its own after a couple of hours. Discussed bringing these concerns up with PCP.     ROS: All other systems reviewed and are otherwise negative except as noted in History  of Present Illness.  Studies Reviewed    ECG personally reviewed by me today: Sinus bradycardia with sinus arrhythmia  59 bpm, RBBB.  No significant changes from 04/17/2022.       Physical Exam    VS:  BP 104/66 (BP Location: Left Arm, Patient Position: Sitting, Cuff Size: Normal)   Pulse (!) 59   Ht 5\' 11"  (1.803 m)   Wt 225 lb (102.1 kg)   SpO2 94%   BMI 31.38 kg/m  , BMI Body mass index is 31.38 kg/m.  GEN: Well nourished, well developed, in no acute distress. Neck: No JVD or carotid bruits. Cardiac:  RRR. No murmurs. No rubs or gallops.   Respiratory:  Respirations regular and unlabored. Clear to auscultation without rales, wheezing or rhonchi. GI: Soft, nontender, nondistended. Extremities: Radials/DP/PT 2+ and equal bilaterally. No clubbing or cyanosis. No edema  Skin: Warm and dry, no rash. Neuro: Strength intact.  Assessment & Plan    CAD/fatigue.  S/p PCI with DES to mid Cx July 2018.  Echo September 2023 showed normal LV function, Grade I DD.  Nuclear stress test April 2024 was a normal low risk study.  Patient reports continued fatigue. Discussed stress test results with patient.  Continue Crestor, Vascepa, Ziac, aspirin. Suggested seeing PCP for his concerns.  Hypertension.  BP today 104/66.[name]  Patient denies headaches or dizziness.  Continue Ziac. Hyperlipidemia.  Direct LDL July 2023 60, at goal.  Continue rosuvastatin and Vascepa.  Disposition: Return in 1 year or sooner as needed.          Signed, Etta Grandchild. Karmina Zufall, DNP, NP-C

## 2022-12-27 ENCOUNTER — Ambulatory Visit: Payer: Medicare HMO | Attending: Student | Admitting: Student

## 2022-12-27 ENCOUNTER — Encounter: Payer: Self-pay | Admitting: Student

## 2022-12-27 VITALS — BP 104/66 | HR 59 | Ht 71.0 in | Wt 225.0 lb

## 2022-12-27 DIAGNOSIS — E785 Hyperlipidemia, unspecified: Secondary | ICD-10-CM | POA: Diagnosis not present

## 2022-12-27 DIAGNOSIS — I10 Essential (primary) hypertension: Secondary | ICD-10-CM

## 2022-12-27 DIAGNOSIS — R5383 Other fatigue: Secondary | ICD-10-CM | POA: Diagnosis not present

## 2022-12-27 DIAGNOSIS — I2510 Atherosclerotic heart disease of native coronary artery without angina pectoris: Secondary | ICD-10-CM

## 2022-12-27 NOTE — Patient Instructions (Signed)
Medication Instructions:  No Changes In Medications at this time.  *If you need a refill on your cardiac medications before your next appointment, please call your pharmacy*  Lab Work: None Ordered At This Time.  If you have labs (blood work) drawn today and your tests are completely normal, you will receive your results only by: MyChart Message (if you have MyChart) OR A paper copy in the mail If you have any lab test that is abnormal or we need to change your treatment, we will call you to review the results.  Testing/Procedures: None Ordered At This Time.   Follow-Up: At [facility], you and your health needs are our priority.  As part of our continuing mission to provide you with exceptional heart care, we have created designated Provider Care Teams.  These Care Teams include your primary Cardiologist (physician) and Advanced Practice Providers (APPs -  Physician Assistants and Nurse Practitioners) who all work together to provide you with the care you need, when you need it.  Your next appointment:   1 year(s)  Provider:   Kenneth C [name], MD     

## 2023-01-30 ENCOUNTER — Emergency Department (HOSPITAL_COMMUNITY)
Admission: EM | Admit: 2023-01-30 | Discharge: 2023-01-30 | Disposition: A | Discharge status: Home / Self Care | Payer: Medicare HMO | Attending: Emergency Medicine | Admitting: Emergency Medicine

## 2023-01-30 ENCOUNTER — Emergency Department (HOSPITAL_COMMUNITY): Payer: Medicare HMO

## 2023-01-30 ENCOUNTER — Other Ambulatory Visit: Payer: Self-pay

## 2023-01-30 DIAGNOSIS — R079 Chest pain, unspecified: Secondary | ICD-10-CM | POA: Diagnosis not present

## 2023-01-30 DIAGNOSIS — S80211A Abrasion, right knee, initial encounter: Secondary | ICD-10-CM | POA: Diagnosis not present

## 2023-01-30 DIAGNOSIS — I7143 Infrarenal abdominal aortic aneurysm, without rupture: Secondary | ICD-10-CM | POA: Insufficient documentation

## 2023-01-30 DIAGNOSIS — K4090 Unilateral inguinal hernia, without obstruction or gangrene, not specified as recurrent: Secondary | ICD-10-CM | POA: Diagnosis not present

## 2023-01-30 DIAGNOSIS — S50311A Abrasion of right elbow, initial encounter: Secondary | ICD-10-CM | POA: Insufficient documentation

## 2023-01-30 DIAGNOSIS — S80212A Abrasion, left knee, initial encounter: Secondary | ICD-10-CM | POA: Insufficient documentation

## 2023-01-30 DIAGNOSIS — I959 Hypotension, unspecified: Secondary | ICD-10-CM | POA: Diagnosis not present

## 2023-01-30 DIAGNOSIS — S3991XA Unspecified injury of abdomen, initial encounter: Secondary | ICD-10-CM | POA: Diagnosis not present

## 2023-01-30 DIAGNOSIS — K5730 Diverticulosis of large intestine without perforation or abscess without bleeding: Secondary | ICD-10-CM | POA: Insufficient documentation

## 2023-01-30 DIAGNOSIS — K760 Fatty (change of) liver, not elsewhere classified: Secondary | ICD-10-CM | POA: Diagnosis not present

## 2023-01-30 DIAGNOSIS — I700 Atherosclerosis of aorta: Secondary | ICD-10-CM | POA: Insufficient documentation

## 2023-01-30 DIAGNOSIS — Z743 Need for continuous supervision: Secondary | ICD-10-CM | POA: Diagnosis not present

## 2023-01-30 DIAGNOSIS — I4510 Unspecified right bundle-branch block: Secondary | ICD-10-CM | POA: Diagnosis not present

## 2023-01-30 DIAGNOSIS — S30811A Abrasion of abdominal wall, initial encounter: Secondary | ICD-10-CM | POA: Insufficient documentation

## 2023-01-30 DIAGNOSIS — S3993XA Unspecified injury of pelvis, initial encounter: Secondary | ICD-10-CM | POA: Diagnosis not present

## 2023-01-30 DIAGNOSIS — W19XXXA Unspecified fall, initial encounter: Secondary | ICD-10-CM

## 2023-01-30 DIAGNOSIS — W109XXA Fall (on) (from) unspecified stairs and steps, initial encounter: Secondary | ICD-10-CM | POA: Insufficient documentation

## 2023-01-30 DIAGNOSIS — R230 Cyanosis: Secondary | ICD-10-CM | POA: Diagnosis not present

## 2023-01-30 DIAGNOSIS — R1011 Right upper quadrant pain: Secondary | ICD-10-CM | POA: Diagnosis not present

## 2023-01-30 LAB — CBC
HCT: 43.6 % (ref 39.0–52.0)
Hemoglobin: 14.9 g/dL (ref 13.0–17.0)
MCH: 34.7 pg — ABNORMAL HIGH (ref 26.0–34.0)
MCHC: 34.2 g/dL (ref 30.0–36.0)
MCV: 101.4 fL — ABNORMAL HIGH (ref 80.0–100.0)
Platelets: 158 10*3/uL (ref 150–400)
RBC: 4.3 MIL/uL (ref 4.22–5.81)
RDW: 13.5 % (ref 11.5–15.5)
WBC: 8.4 10*3/uL (ref 4.0–10.5)
nRBC: 0 % (ref 0.0–0.2)

## 2023-01-30 LAB — COMPREHENSIVE METABOLIC PANEL
ALT: 27 U/L (ref 0–44)
AST: 50 U/L — ABNORMAL HIGH (ref 15–41)
Albumin: 3.4 g/dL — ABNORMAL LOW (ref 3.5–5.0)
Alkaline Phosphatase: 26 U/L — ABNORMAL LOW (ref 38–126)
Anion gap: 13 (ref 5–15)
BUN: 14 mg/dL (ref 8–23)
CO2: 18 mmol/L — ABNORMAL LOW (ref 22–32)
Calcium: 8.6 mg/dL — ABNORMAL LOW (ref 8.9–10.3)
Chloride: 106 mmol/L (ref 98–111)
Creatinine, Ser: 1.54 mg/dL — ABNORMAL HIGH (ref 0.61–1.24)
GFR, Estimated: 47 mL/min — ABNORMAL LOW (ref >60)
Glucose, Bld: 103 mg/dL — ABNORMAL HIGH (ref 70–99)
Potassium: 3.6 mmol/L (ref 3.5–5.1)
Sodium: 137 mmol/L (ref 135–145)
Total Bilirubin: 0.9 mg/dL (ref 0.3–1.2)
Total Protein: 6.5 g/dL (ref 6.5–8.1)

## 2023-01-30 MED ORDER — IOHEXOL 350 MG/ML SOLN
75.0000 mL | Freq: Once | INTRAVENOUS | Status: AC | PRN
  Administered 2023-01-30: 75 mL via INTRAVENOUS

## 2023-01-30 NOTE — ED Provider Notes (Signed)
[place] EMERGENCY DEPARTMENT AT [facility] Provider Note   CSN: [id number] Arrival date & time: 01/30/23  1856     History  Chief Complaint  Patient presents with   Marletta Lor    Charles Villa is a 77 y.o. male.  77 year old male suffered a mechanical fall from standing on some stairs earlier after carrying a heavy cooler, fell backwards and then rolled down some stairs on his front.  Wife at bedside says no loss of consciousness, however believes that he was globally weak after the fall.  At this time he is complaint free.  Denies any other systemic symptoms.   The history is provided by the patient and the spouse.  Fall Associated symptoms include shortness of breath. Pertinent negatives include no chest pain.       Home Medications Prior to Admission medications   Medication Sig Start Date End Date Taking? Authorizing Provider  aspirin EC 81 MG tablet Take 81 mg by mouth daily.    [provider]  bisoprolol-hydrochlorothiazide [facility]) 2.5-6.25 MG tablet TAKE 1 TABLET BY MOUTH EVERY DAY 06/24/22   Shelva Majestic, MD  clobetasol (TEMOVATE) 0.05 % external solution Apply 1 application topically at bedtime as needed (scalp).  07/17/17   [provider]  clobetasol cream (TEMOVATE) 0.05 % Apply 1 application topically 2 (two) times daily as needed (abdomen).  07/17/17   [provider]  clotrimazole-betamethasone (LOTRISONE) cream Apply 1 application topically 2 (two) times daily as needed. psoriass    [provider]  desonide (DESONATE) 0.05 % gel Apply 1 application  topically 2 (two) times daily as needed. Skin lesions    [provider]  fenofibrate 160 MG tablet Take 1 tablet (160 mg total) by mouth daily. 04/17/22   Sharlene Dory, NP  fluocinolone (VANOS) 0.01 % cream Apply 1 application topically daily as needed.     [provider]  hydrocortisone (ANUSOL-HC) 2.5 % rectal cream Place 1 application rectally  2 (two) times daily as needed for hemorrhoids or anal itching.    [provider]  icosapent Ethyl (VASCEPA) 1 g capsule TAKE 2 CAPSULES BY MOUTH TWO TIMES A DAY. 11/26/22   [name], Lisette Abu, MD  pantoprazole (PROTONIX) 40 MG tablet TAKE 1 TABLET BY MOUTH EVERY DAY 11/01/22   Azalee Course, PA  rosuvastatin (CRESTOR) 40 MG tablet TAKE 1 TABLET BY MOUTH EVERY DAY 09/20/22   Shelva Majestic, MD  silver sulfADIAZINE (SILVADENE) 1 % cream Apply 1 application topically 2 (two) times daily as needed (groin).  07/17/17   [provider]      Allergies    Patient has no known allergies.    Review of Systems   Review of Systems  Constitutional:  Negative for appetite change, chills, fatigue and fever.  Respiratory:  Positive for shortness of breath.   Cardiovascular:  Negative for chest pain and palpitations.  Gastrointestinal:  Positive for constipation. Negative for abdominal distention, nausea and vomiting.  Neurological:  Positive for weakness (Global, per wife). Negative for dizziness, syncope and light-headedness.    Physical Exam Updated Vital Signs There were no vitals taken for this visit. Physical Exam Vitals and nursing note reviewed.  Constitutional:      General: He is not in acute distress.    Appearance: He is well-developed.  HENT:     Head: Normocephalic and atraumatic.     Nose: Nose normal.     Mouth/Throat:     Mouth: Mucous membranes  are moist.  Eyes:     Conjunctiva/sclera: Conjunctivae normal.  Cardiovascular:     Rate and Rhythm: Normal rate and regular rhythm.     Heart sounds: Normal heart sounds. No murmur heard. Pulmonary:     Effort: Pulmonary effort is normal. No respiratory distress.     Breath sounds: Normal breath sounds.  Abdominal:     General: There is no distension.     Palpations: Abdomen is soft.     Tenderness: There is no abdominal tenderness. There is no guarding or rebound.     Comments: Diastases present.  Scattered abrasions  bilaterally across the abdomen  Musculoskeletal:        General: No swelling.     Cervical back: Neck supple.     Comments: Minor abrasions across the bilateral knees, right elbow  Skin:    General: Skin is warm and dry.     Capillary Refill: Capillary refill takes less than 2 seconds.  Neurological:     Mental Status: He is alert.  Psychiatric:        Mood and Affect: Mood normal.     ED Results / Procedures / Treatments   Labs (all labs ordered are listed, but only abnormal results are displayed) Labs Reviewed - No data to display  EKG None  Radiology No results found.  Procedures Procedures    Medications Ordered in ED Medications - No data to display  ED Course/ Medical Decision Making/ A&P                             Medical Decision Making 77 year old male mechanical fall from standing.  History and physical exam overall reassuring.  Patient does report that he has had some increasing weakness, this is corroborated by his wife.  He has been thoroughly worked up as an outpatient for this.  Is awaiting referral to pulmonology for dyspnea.  He has seen cardiology for this.  He is noted to be borderline bradycardic here today.  Wife at bedside is concerned about dip in his O2 saturation tonight.  It went down to about 90.  Patient was asymptomatic, denies any significant shortness of breath at this time.  Will plan to pursue a chest x-ray.  Regarding his fall he has some scattered abrasions over his belly which is nontender, not peritonitic.  Low suspicion for current intra-abdominal bleed, however given mechanism of fall and visible external trauma, will pursue CT abdomen pelvis.  Basic labs.  Labs revealing only for a mild bump in his creatinine, discussed this with the patient at bedside and he has close follow-up available to him with the PCP.  Additionally, CT showed that he does have an abdominal aortic aneurysm as well as bilateral iliac aneurysms, I do not think  that these are related to his fall and he will be best served by following up with his primary care for this as well.  I am reassured that he is capable of close follow-up, and has good social support at home.  Ultimately, patient discharged in stable condition.  Strict return precautions were provided and patient verbalized understanding.  Both patient and wife are in agreement with the plan.  Amount and/or Complexity of Data Reviewed Independent Historian: spouse    Details: Wife at bedside Labs: ordered. Decision-making details documented in ED Course.    Details: Mild bump and creatinine, however no evidence of significant electrolyte abnormality Radiology: ordered. Decision-making details documented  in ED Course.    Details: No evidence of intra-abdominal traumatic injury ECG/medicine tests: ordered. Decision-making details documented in ED Course.  Risk Prescription drug management.          Final Clinical Impression(s) / ED Diagnoses Final diagnoses:  None    Rx / DC Orders ED Discharge Orders     None         Fayrene Helper, MD 01/30/23 2310    Tegeler, Canary Brim, MD 01/31/23 2329

## 2023-01-30 NOTE — ED Triage Notes (Signed)
Pt with multiple episodes of "knees giving way" recently.  Fell while carrying a cooler on some steps earlier and landed on a planter with abrasion to right upper quadrant.  Both knees with abrasions. No complaints of pain.  EMS reports pt was cyanotic around his lips and placed on O2.  Pt is 94% on RA on arrival.

## 2023-01-30 NOTE — Discharge Instructions (Addendum)
You were seen for fall.  We got a chest x-ray as well as a CT of your abdomen pelvis.  There were no indications of an injury as a direct result of your fall.  Your CT scan did show that you have an abdominal aortic aneurysm that measures 3.5 cm as well as bilateral iliac aneurysms.  You need to follow-up with your primary care physician about these.  Additionally, your creatinine was mildly elevated from last check.  Please drink plenty of fluids, and have your primary care doctor follow-up with you about any repeat labs that might be necessary.  If you have any new or worsening symptoms, please come back to the emergency department.

## 2023-02-03 ENCOUNTER — Telehealth: Payer: Self-pay | Admitting: Family Medicine

## 2023-02-03 ENCOUNTER — Ambulatory Visit (INDEPENDENT_AMBULATORY_CARE_PROVIDER_SITE_OTHER): Payer: Medicare HMO | Admitting: Family

## 2023-02-03 VITALS — BP 101/67 | HR 71 | Temp 97.1°F | Ht 71.0 in | Wt 219.4 lb

## 2023-02-03 DIAGNOSIS — R269 Unspecified abnormalities of gait and mobility: Secondary | ICD-10-CM | POA: Diagnosis not present

## 2023-02-03 DIAGNOSIS — G64 Other disorders of peripheral nervous system: Secondary | ICD-10-CM

## 2023-02-03 DIAGNOSIS — R531 Weakness: Secondary | ICD-10-CM

## 2023-02-03 DIAGNOSIS — I7143 Infrarenal abdominal aortic aneurysm, without rupture: Secondary | ICD-10-CM | POA: Diagnosis not present

## 2023-02-03 NOTE — Assessment & Plan Note (Signed)
incidental finding in ER 3.5cm, U/S recommended q2y to monitor advised pt on importance of BP control, no hx of smoking, needs to stop alcohol f/u with PCP

## 2023-02-03 NOTE — Progress Notes (Signed)
Patient ID: Charles Villa, male    DOB: 06-27-1946, 77 y.o.   MRN: [id number]  Chief Complaint  Patient presents with   Follow-up    Pt was seen in ED on 7/4, Pt states he is still feeling off balance and abdominal pain subsided.     HPI: ED f/u:  pt fell at home 4d ago while walking up stairs and carrying a heavy cooler, fell against the cooler and scraped his knee and arm on the brick stairs. He has had a chronic issue with weakness and balance. Pt reports numbness and occasional tingling, but no burning in the bottom of both feet.which contribute to his imbalance. He is asking if he needs to see a neurologist.  Has healing scabs on right elbow and right knee. pt has concerns about ongoing weakness, fatigue, and SOB with exertion. He reports he had a CXR (normal per review of EMR notes) and CT abdomen done in ER and CT found a 3.5cm abd aneurysm.    Assessment & Plan:  Abnormal gait due to peripheral sensory disorder issue has been addressed in past by PCP, continue to advise on complete cessation of alcohol intake. pt also reports general weakness in bilateral legs, states he has not been to PT previously, sending referral today. Advised on not carrying anything heavier than 5lbs when walking up or down stairs to avoid future falls.  -     Ambulatory referral to Physical Therapy  Weakness -     Ambulatory referral to Physical Therapy  Infrarenal abdominal aortic aneurysm (AAA) without rupture [facility]) Assessment & Plan: incidental finding in ER 3.5cm, U/S recommended q2y to monitor advised pt on importance of BP control, no hx of smoking, needs to stop alcohol f/u with PCP   Subjective:    Outpatient Medications Prior to Visit  Medication Sig Dispense Refill   aspirin EC 81 MG tablet Take 81 mg by mouth daily.     bisoprolol-hydrochlorothiazide (ZIAC) 2.5-6.25 MG tablet TAKE 1 TABLET BY MOUTH EVERY DAY 90 tablet 3   clobetasol (TEMOVATE) 0.05 % external solution Apply 1  application topically at bedtime as needed (scalp).   5   clobetasol cream (TEMOVATE) 0.05 % Apply 1 application topically 2 (two) times daily as needed (abdomen).   3   clotrimazole-betamethasone (LOTRISONE) cream Apply 1 application topically 2 (two) times daily as needed. psoriass     desonide (DESONATE) 0.05 % gel Apply 1 application  topically 2 (two) times daily as needed. Skin lesions     fenofibrate 160 MG tablet Take 1 tablet (160 mg total) by mouth daily. 90 tablet 3   fluocinolone (VANOS) 0.01 % cream Apply 1 application topically daily as needed.      hydrocortisone (ANUSOL-HC) 2.5 % rectal cream Place 1 application rectally 2 (two) times daily as needed for hemorrhoids or anal itching.     icosapent Ethyl (VASCEPA) 1 g capsule TAKE 2 CAPSULES BY MOUTH TWO TIMES A DAY. 120 capsule 5   pantoprazole (PROTONIX) 40 MG tablet TAKE 1 TABLET BY MOUTH EVERY DAY 90 tablet 1   rosuvastatin (CRESTOR) 40 MG tablet TAKE 1 TABLET BY MOUTH EVERY DAY 90 tablet 1   silver sulfADIAZINE (SILVADENE) 1 % cream Apply 1 application topically 2 (two) times daily as needed (groin).   5   No facility-administered medications prior to visit.   Past Medical History:  Diagnosis Date   Adenomatous colon polyp 11/02/2019   Anal fissure 08/17/2012   Really more  gluteal crease irritation- protosol hc 2.5% prn    BACK PAIN, CHRONIC 02/10/2007   CAD (coronary artery disease), native coronary artery    02/24/17-PCI/DES to mLcx, normal EF.   Diverticulosis of colon    2007   GERD 02/23/2007   GERD (gastroesophageal reflux disease)    Hyperlipidemia    Hypertension    OCD (obsessive compulsive disorder) 01/23/2016   Ocd/hoarding issues- psychiatristis- [place] started on zoloft 100mg --> paxil   Personal history of colonic polyps 03/14/2014   08/2005. Benign. 10 year repeat. 2010 colonoscopy in record was entered erroneously.     PSORIASIS 02/10/2007   Topical therapy.      Scalp psoriasis    SQUAMOUS CELL CARCINOMA OF  SKIN SITE UNSPECIFIED 08/24/2010   Annotation: face    Past Surgical History:  Procedure Laterality Date   COLONOSCOPY     COLONOSCOPY W/ POLYPECTOMY     2007 Dr Arlyce Dice   CORONARY STENT INTERVENTION N/A 02/24/2017   Procedure: Coronary Stent Intervention;  Surgeon: Marykay Lex, MD;  Location: [facility] INVASIVE CV LAB;  Service: Cardiovascular;  Laterality: N/A;   LEFT HEART CATH AND CORONARY ANGIOGRAPHY N/A 02/24/2017   Procedure: Left Heart Cath and Coronary Angiography;  Surgeon: Marykay Lex, MD;  Location: [facility] INVASIVE CV LAB;  Service: Cardiovascular;  Laterality: N/A;   POLYPECTOMY     SKIN CANCER EXCISION     squamous cell CA - nose   TONSILLECTOMY     No Known Allergies    Objective:    Physical Exam Vitals and nursing note reviewed.  Constitutional:      General: He is not in acute distress.    Appearance: Normal appearance.  HENT:     Head: Normocephalic.  Cardiovascular:     Rate and Rhythm: Normal rate and regular rhythm.  Pulmonary:     Effort: Pulmonary effort is normal.     Breath sounds: Normal breath sounds.  Musculoskeletal:        General: Normal range of motion.     Cervical back: Normal range of motion.  Skin:    General: Skin is warm and dry.     Findings: Wound (healing scabs noted on right knee (5cm diameter) and 2 on right posterior forearm) present.  Neurological:     Mental Status: He is alert and oriented to person, place, and time.  Psychiatric:        Mood and Affect: Mood normal.    BP 101/67   Pulse 71   Temp (!) 97.1 F (36.2 C) (Temporal)   Ht 5\' 11"  (1.803 m)   Wt 219 lb 6.4 oz (99.5 kg)   SpO2 93%   BMI 30.60 kg/m  Wt Readings from Last 3 Encounters:  02/03/23 219 lb 6.4 oz (99.5 kg)  12/27/22 225 lb (102.1 kg)  11/22/22 226 lb (102.5 kg)      Dulce Sellar, NP

## 2023-02-03 NOTE — Telephone Encounter (Signed)
Caller is patient's spouse. States patient forgot to mention during ED f/u with [facility] today that ED provider found 3 1/2 cm abdominal aortic aneurysm during imaging in ED. States they were informed to follow up with an [place] to further evaluate. Please Advise.

## 2023-02-03 NOTE — Patient Instructions (Addendum)
It was very nice to see you today!    Glad you are feeling better!   I am sending a referral to our physical therapy office to help you with strengthening your legs and your balance.  Be sure you are not carrying anything heavier than 5lbs up and down stairs and you are holding on to a railing.  Remember to drink at least 8 cups of water daily, starting in the morning to keep your kidney function up!  Decrease your intake of alcohol to reduce your neuropathy symptoms.      PLEASE NOTE:  If you had any lab tests please let us know if you have not heard back within a few days. You may see your results on MyChart before we have a chance to review them but we will give you a call once they are reviewed by [place]. If we ordered any referrals today, please let us know if you have not heard from their office within the next week.

## 2023-02-03 NOTE — Telephone Encounter (Signed)
See below, pt saw you all today.

## 2023-02-04 ENCOUNTER — Telehealth: Payer: Self-pay

## 2023-02-04 NOTE — Telephone Encounter (Signed)
Transition Care Management Unsuccessful Follow-up Telephone Call  Date of discharge and from where:  [facility] 7/4  Attempts:  1st Attempt  Reason for unsuccessful TCM follow-up call:  No answer/busy   Lenard Forth [facility] Guide, [facility] [phone] [address] [place], [place], [place] [address] Phone: [phone] Email: [email or web address]

## 2023-02-04 NOTE — Telephone Encounter (Signed)
please try one more time to reach pt, if unable, send a letter with my response to above, thx.

## 2023-02-04 NOTE — Telephone Encounter (Signed)
-----   Message from Dulce Sellar, NP sent at 02/03/2023  5:49 PM EDT ----- Regarding: CT scan results please call Charles Villa back and let him I received his message about the abdominal aneurysm. It is recommended to monitor every 2 years with ultrasound, and I have entered this into his chart and will make Dr Durene Cal aware. Thx

## 2023-02-04 NOTE — Telephone Encounter (Signed)
I called pt and unable to lvm, vm box full.

## 2023-02-05 ENCOUNTER — Telehealth: Payer: Self-pay

## 2023-02-05 NOTE — Telephone Encounter (Signed)
Transition Care Management Follow-up Telephone Call Date of discharge and from where: [facility] 7/4 How have you been since you were released from the hospital? Doing fine   Any questions or concerns? No  Items Reviewed: Did the pt receive and understand the discharge instructions provided? Yes  Medications obtained and verified? Yes  Other? No  Any new allergies since your discharge? No  Dietary orders reviewed? No Do you have support at home? Yes     Follow up appointments reviewed:  PCP Hospital f/u appt confirmed? No  Scheduled to see  on  @ . Specialist Hospital f/u appt confirmed? Yes  Scheduled to see  on  @ . Are transportation arrangements needed? No  If their condition worsens, is the pt aware to call PCP or go to the Emergency Dept.? Yes Was the patient provided with contact information for the PCP's office or ED? Yes Was to pt encouraged to call back with questions or concerns? Yes

## 2023-02-05 NOTE — Telephone Encounter (Signed)
Pt states he would like to speak with an MD for further recommendations, I let christie know to call and schedule pt. Pt gave a verbalized understanding.

## 2023-02-12 NOTE — Therapy (Signed)
OUTPATIENT PHYSICAL THERAPY LOWER EXTREMITY EVALUATION   Patient Name: Charles Villa MRN: [id number] DOB:1945-08-13, 77 y.o., male Today's Date: 02/13/2023  END OF SESSION:  PT End of Session - 02/13/23 1439     Visit Number 1    Number of Visits 16    Date for PT Re-Evaluation 05/08/23    PT Start Time 1439   pt late to check in   PT Stop Time 1514    PT Time Calculation (min) 35 min    Equipment Utilized During Treatment Gait belt    Activity Tolerance No increased pain    Behavior During Therapy WFL for tasks assessed/performed             Past Medical History:  Diagnosis Date   Adenomatous colon polyp 11/02/2019   Anal fissure 08/17/2012   Really more gluteal crease irritation- protosol hc 2.5% prn    BACK PAIN, CHRONIC 02/10/2007   CAD (coronary artery disease), native coronary artery    02/24/17-PCI/DES to mLcx, normal EF.   Diverticulosis of colon    2007   GERD 02/23/2007   GERD (gastroesophageal reflux disease)    Hyperlipidemia    Hypertension    OCD (obsessive compulsive disorder) 01/23/2016   Ocd/hoarding issues- psychiatristis- [place] started on zoloft 100mg --> paxil   Personal history of colonic polyps 03/14/2014   08/2005. Benign. 10 year repeat. 2010 colonoscopy in record was entered erroneously.     PSORIASIS 02/10/2007   Topical therapy.      Scalp psoriasis    SQUAMOUS CELL CARCINOMA OF SKIN SITE UNSPECIFIED 08/24/2010   Annotation: face    Past Surgical History:  Procedure Laterality Date   COLONOSCOPY     COLONOSCOPY W/ POLYPECTOMY     2007 Dr Arlyce Dice   CORONARY STENT INTERVENTION N/A 02/24/2017   Procedure: Coronary Stent Intervention;  Surgeon: Marykay Lex, MD;  Location: [facility] INVASIVE CV LAB;  Service: Cardiovascular;  Laterality: N/A;   LEFT HEART CATH AND CORONARY ANGIOGRAPHY N/A 02/24/2017   Procedure: Left Heart Cath and Coronary Angiography;  Surgeon: Marykay Lex, MD;  Location: [facility] INVASIVE CV LAB;  Service: Cardiovascular;   Laterality: N/A;   POLYPECTOMY     SKIN CANCER EXCISION     squamous cell CA - nose   TONSILLECTOMY     Patient Active Problem List   Diagnosis Date Noted   Infrarenal abdominal aortic aneurysm (AAA) without rupture (HCC) 02/03/2023   History of adenomatous polyp of colon 11/02/2019   Senile purpura (HCC) 05/06/2019   Alcoholic peripheral neuropathy (HCC) 02/09/2019   Thiamine deficiency 04/28/2017   CAD S/P percutaneous coronary angioplasty 02/24/2017   S/P cardiac catheterization 02/24/2017   Alcoholism in recovery (HCC) 02/03/2017   Tremor 05/06/2016   OCD (obsessive compulsive disorder) 01/23/2016   Essential hypertension 03/11/2014   Anal fissure 08/17/2012   SQUAMOUS CELL CARCINOMA OF SKIN SITE UNSPECIFIED 08/24/2010   GERD 02/23/2007   Hyperlipidemia with target low density lipoprotein (LDL) cholesterol less than 70 mg/dL 78/29/5621   PSORIASIS 02/10/2007   BACK PAIN, CHRONIC 02/10/2007    PCP: Shelva Majestic, MD   REFERRING PROVIDER: Dulce Sellar, NP   REFERRING DIAG:  R53.1 (ICD-10-CM) - Weakness  G64,R26.9 (ICD-10-CM) - Abnormal gait due to peripheral sensory disorder    THERAPY DIAG:  Muscle weakness (generalized)  Difficulty in walking, not elsewhere classified  History of falling  Rationale for Evaluation and Treatment: Rehabilitation  ONSET DATE: 6 months   SUBJECTIVE:   SUBJECTIVE STATEMENT:  States that he has been having progressively reduced function over the last 6 months. States that he gets short of breath, difficulty weed eating for more than 5 minutes at a time. Reports he has had one fall recently carrying a heavy cooler but no falls otherwise. States he has a BP cuff and it runs low. States he has balance issues.   States that he also has peripheral neruopathy and wants to improve his balance and strength     PERTINENT HISTORY: CAD PAIN:  Are you having pain? No  PRECAUTIONS: None  RED FLAGS: None   WEIGHT BEARING  RESTRICTIONS: No  FALLS:  Has patient fallen in last 6 months? Yes. Number of falls 1  LIVING ENVIRONMENT: Lives with: lives with their spouse Lives in: House/apartment Stairs: Yes: Internal: 14 steps; on left going up 4 outside left side railing going up  Has following equipment at home: None  OCCUPATION: consulting PRN- occasional travel   PLOF: Independent with basic ADLs  PATIENT GOALS: improved balance and endurance  NEXT MD VISIT: 02/26/23  OBJECTIVE:   DIAGNOSTIC FINDINGS: recent CT - see uploads   COGNITION: Overall cognitive status: Within functional limits for tasks assessed     SENSATION: Not tested  EDEMA:  None present   Observation: Shuffles feet, resting tremor noted in right hand, crouched gait    LE Measurements Lower Extremity Right EVAL Left EVAL   A/PROM MMT A/PROM MMT  Hip Flexion  4  4  Hip Extension      Hip Abduction      Hip Adduction      Hip Internal rotation      Hip External rotation      Knee Flexion  4  4  Knee Extension  4  4  Ankle Dorsiflexion  4-  4  Ankle Plantarflexion      Ankle Inversion      Ankle Eversion       (Blank rows = not tested) * pain    FUNCTIONAL TESTS:  5x STS - 16.53 seconds no UE support test - 175 feet - shuffles gait, minimal hip/knee/ankle ROM bent slouched gait  SLS - unable to balance on either leg without UE support- can balance for 30 seconds on either leg with one hand support but moderate sway and crouched gait.  -walking with cane and with RW - increased stride length and stability noted.     TODAY'S TREATMENT:                                                                                                                              DATE:   02/13/2023  Therapeutic Exercise:  Aerobic: Supine: Prone:  Seated: LAQs x5 5' holds B, DF x15 5" holds  Standing: Neuromuscular Re-education: Manual Therapy: Therapeutic Activity: Self Care: Trigger Point Dry Needling:   Modalities:    PATIENT EDUCATION:  Education details: on current presentation, on HEP, on clinical outcomes  score and POC, on benefits of RW/cane and current findings, on breathing with activity Person educated: Patient Education method: Explanation, Demonstration, and Handouts Education comprehension: verbalized understanding   HOME EXERCISE PROGRAM: [phone]  ASSESSMENT:  CLINICAL IMPRESSION: Patient presents to physical therapy with complaints of weakness, balance issues and decreased endurance. Patient with shuffling of feet, limited foot clearance and increased shortness of breath with walking back to treatment room. Educated patient in benefits of use of cane and RW with walking longer distance as he improved balance and reduced shortness of breath.  Patient would greatly benefit form skilled PT to improve overall function and reduce fall risk.   OBJECTIVE IMPAIRMENTS: decreased activity tolerance, decreased balance, decreased endurance, decreased knowledge of use of DME, decreased mobility, difficulty walking, decreased ROM, decreased strength, improper body mechanics, and postural dysfunction.   ACTIVITY LIMITATIONS: lifting, bending, standing, squatting, stairs, transfers, and locomotion level  PARTICIPATION LIMITATIONS: community activity, occupation, and yard work  PERSONAL FACTORS: Age, Fitness, Time since onset of injury/illness/exacerbation, and 1 comorbidity: peripheral neuropathy  are also affecting patient's functional outcome.   REHAB POTENTIAL: Good  CLINICAL DECISION MAKING: Evolving/moderate complexity  EVALUATION COMPLEXITY: Moderate   GOALS: Goals reviewed with patient? yes  SHORT TERM GOALS: Target date: 03/27/2023   Patient will be independent in self management strategies to improve quality of life and functional outcomes. Baseline: New Program Goal status: INITIAL  2.  Patient will report at least 25% improvement in overall symptoms and/or function  to demonstrate improved functional mobility Baseline: 0% better Goal status: INITIAL  3.  Patient will be able to walk for 5 minutes with or without AD without increased shortness of breath to demonstrate improved functional endurance. Baseline: unable Goal status: INITIAL     LONG TERM GOALS: Target date: 05/08/2023    Patient will report at least 50% improvement in overall symptoms and/or function to demonstrate improved functional mobility Baseline: 0% better Goal status: INITIAL  2.  Patient will be able to stand on one leg for at least 5 seconds without UE support to demonstrate improved static balance Baseline: unable Goal status: INITIAL  3.  Patient will be able to walk at least 250 feet in 2 minutes to demonstrate improved walking speed and endurance Baseline: see above Goal status: INITIAL     PLAN:  PT FREQUENCY: 1-2x/week for up to 16 visits   PT DURATION: 12 weeks  PLANNED INTERVENTIONS: Therapeutic exercises, Therapeutic activity, Neuromuscular re-education, Balance training, Gait training, Patient/Family education, Self Care, Joint mobilization, Joint manipulation, Stair training, Vestibular training, Canalith repositioning, Orthotic/Fit training, Prosthetic training, DME instructions, Aquatic Therapy, Dry Needling, Electrical stimulation, Spinal manipulation, Spinal mobilization, Cryotherapy, Moist heat, Taping, Traction, Ultrasound, Ionotophoresis 4mg /ml Dexamethasone, Manual therapy, and Re-evaluation.  PLAN FOR NEXT SESSION: balance, MMT in LE, f/u with AD    4:46 PM, 02/13/23 Tereasa Coop, DPT Physical Therapy with [facility]

## 2023-02-13 ENCOUNTER — Ambulatory Visit: Payer: Medicare HMO | Admitting: Physical Therapy

## 2023-02-13 ENCOUNTER — Encounter: Payer: Self-pay | Admitting: Physical Therapy

## 2023-02-13 DIAGNOSIS — R262 Difficulty in walking, not elsewhere classified: Secondary | ICD-10-CM | POA: Diagnosis not present

## 2023-02-13 DIAGNOSIS — Z9181 History of falling: Secondary | ICD-10-CM

## 2023-02-13 DIAGNOSIS — M6281 Muscle weakness (generalized): Secondary | ICD-10-CM | POA: Diagnosis not present

## 2023-02-19 ENCOUNTER — Encounter: Payer: Medicare HMO | Admitting: Physical Therapy

## 2023-02-26 ENCOUNTER — Encounter: Payer: Self-pay | Admitting: Physical Therapy

## 2023-02-26 ENCOUNTER — Ambulatory Visit (INDEPENDENT_AMBULATORY_CARE_PROVIDER_SITE_OTHER): Payer: Medicare HMO | Admitting: Family Medicine

## 2023-02-26 ENCOUNTER — Encounter: Payer: Self-pay | Admitting: Family Medicine

## 2023-02-26 ENCOUNTER — Ambulatory Visit: Payer: Medicare HMO | Admitting: Physical Therapy

## 2023-02-26 VITALS — BP 100/62 | HR 63 | Temp 97.1°F | Ht 71.0 in | Wt 220.4 lb

## 2023-02-26 DIAGNOSIS — M6281 Muscle weakness (generalized): Secondary | ICD-10-CM | POA: Diagnosis not present

## 2023-02-26 DIAGNOSIS — Z9181 History of falling: Secondary | ICD-10-CM | POA: Diagnosis not present

## 2023-02-26 DIAGNOSIS — F1020 Alcohol dependence, uncomplicated: Secondary | ICD-10-CM | POA: Diagnosis not present

## 2023-02-26 DIAGNOSIS — E785 Hyperlipidemia, unspecified: Secondary | ICD-10-CM

## 2023-02-26 DIAGNOSIS — I7143 Infrarenal abdominal aortic aneurysm, without rupture: Secondary | ICD-10-CM

## 2023-02-26 DIAGNOSIS — I10 Essential (primary) hypertension: Secondary | ICD-10-CM | POA: Diagnosis not present

## 2023-02-26 DIAGNOSIS — R262 Difficulty in walking, not elsewhere classified: Secondary | ICD-10-CM

## 2023-02-26 NOTE — Therapy (Signed)
OUTPATIENT PHYSICAL THERAPY LOWER EXTREMITY TREATMENT    Patient Name: Charles Villa MRN: [id number] DOB:11-02-1945, 77 y.o., male Today's Date: 02/26/2023  END OF SESSION:  PT End of Session - 02/26/23 1314     Visit Number 2    Number of Visits 16    Date for PT Re-Evaluation 05/08/23    PT Start Time 1315   late to apt   PT Stop Time 1353    PT Time Calculation (min) 38 min    Equipment Utilized During Treatment Gait belt    Activity Tolerance No increased pain    Behavior During Therapy WFL for tasks assessed/performed             Past Medical History:  Diagnosis Date   Adenomatous colon polyp 11/02/2019   Anal fissure 08/17/2012   Really more gluteal crease irritation- protosol hc 2.5% prn    BACK PAIN, CHRONIC 02/10/2007   CAD (coronary artery disease), native coronary artery    02/24/17-PCI/DES to mLcx, normal EF.   Diverticulosis of colon    2007   GERD 02/23/2007   GERD (gastroesophageal reflux disease)    Hyperlipidemia    Hypertension    OCD (obsessive compulsive disorder) 01/23/2016   Ocd/hoarding issues- psychiatristis- [place] started on zoloft 100mg --> paxil   Personal history of colonic polyps 03/14/2014   08/2005. Benign. 10 year repeat. 2010 colonoscopy in record was entered erroneously.     PSORIASIS 02/10/2007   Topical therapy.      Scalp psoriasis    SQUAMOUS CELL CARCINOMA OF SKIN SITE UNSPECIFIED 08/24/2010   Annotation: face    Past Surgical History:  Procedure Laterality Date   COLONOSCOPY     COLONOSCOPY W/ POLYPECTOMY     2007 Dr Arlyce Dice   CORONARY STENT INTERVENTION N/A 02/24/2017   Procedure: Coronary Stent Intervention;  Surgeon: Marykay Lex, MD;  Location: [facility] INVASIVE CV LAB;  Service: Cardiovascular;  Laterality: N/A;   LEFT HEART CATH AND CORONARY ANGIOGRAPHY N/A 02/24/2017   Procedure: Left Heart Cath and Coronary Angiography;  Surgeon: Marykay Lex, MD;  Location: [facility] INVASIVE CV LAB;  Service: Cardiovascular;  Laterality:  N/A;   POLYPECTOMY     SKIN CANCER EXCISION     squamous cell CA - nose   TONSILLECTOMY     Patient Active Problem List   Diagnosis Date Noted   Infrarenal abdominal aortic aneurysm (AAA) without rupture (HCC) 02/03/2023   History of adenomatous polyp of colon 11/02/2019   Senile purpura (HCC) 05/06/2019   Alcoholic peripheral neuropathy (HCC) 02/09/2019   Thiamine deficiency 04/28/2017   CAD S/P percutaneous coronary angioplasty 02/24/2017   S/P cardiac catheterization 02/24/2017   Alcoholism in recovery (HCC) 02/03/2017   Tremor 05/06/2016   OCD (obsessive compulsive disorder) 01/23/2016   Essential hypertension 03/11/2014   Anal fissure 08/17/2012   SQUAMOUS CELL CARCINOMA OF SKIN SITE UNSPECIFIED 08/24/2010   GERD 02/23/2007   Hyperlipidemia with target low density lipoprotein (LDL) cholesterol less than 70 mg/dL 78/29/5621   PSORIASIS 02/10/2007   BACK PAIN, CHRONIC 02/10/2007    PCP: Shelva Majestic, MD   REFERRING PROVIDER: Dulce Sellar, NP   REFERRING DIAG:  R53.1 (ICD-10-CM) - Weakness  G64,R26.9 (ICD-10-CM) - Abnormal gait due to peripheral sensory disorder    THERAPY DIAG:  Muscle weakness (generalized)  Difficulty in walking, not elsewhere classified  History of falling  Rationale for Evaluation and Treatment: Rehabilitation  ONSET DATE: 6 months   SUBJECTIVE:   SUBJECTIVE STATEMENT: 02/26/2023  States he did not look into AD because his wife has been out of town but will look this week. States he only did the exercises a couple of times as he is unmotivated.    Eval: States that he has been having progressively reduced function over the last 6 months. States that he gets short of breath, difficulty weed eating for more than 5 minutes at a time. Reports he has had one fall recently carrying a heavy cooler but no falls otherwise. States he has a BP cuff and it runs low. States he has balance issues.   States that he also has peripheral  neruopathy and wants to improve his balance and strength     PERTINENT HISTORY: CAD PAIN:  Are you having pain? No  PRECAUTIONS: None  RED FLAGS: None   WEIGHT BEARING RESTRICTIONS: No  FALLS:  Has patient fallen in last 6 months? Yes. Number of falls 1  LIVING ENVIRONMENT: Lives with: lives with their spouse Lives in: House/apartment Stairs: Yes: Internal: 14 steps; on left going up 4 outside left side railing going up  Has following equipment at home: None  OCCUPATION: consulting PRN- occasional travel   PLOF: Independent with basic ADLs  PATIENT GOALS: improved balance and endurance  NEXT MD VISIT: 02/26/23  OBJECTIVE:   DIAGNOSTIC FINDINGS: recent CT - see uploads   COGNITION: Overall cognitive status: Within functional limits for tasks assessed     SENSATION: Not tested  EDEMA:  None present   Observation: Shuffles feet, resting tremor noted in right hand, crouched gait    LE Measurements Lower Extremity Right EVAL Left EVAL   A/PROM MMT A/PROM MMT  Hip Flexion  4  4  Hip Extension      Hip Abduction      Hip Adduction      Hip Internal rotation      Hip External rotation      Knee Flexion  4  4  Knee Extension  4  4  Ankle Dorsiflexion  4-  4  Ankle Plantarflexion      Ankle Inversion      Ankle Eversion       (Blank rows = not tested) * pain    FUNCTIONAL TESTS:  5x STS - 16.53 seconds no UE support test - 175 feet - shuffles gait, minimal hip/knee/ankle ROM bent slouched gait  SLS - unable to balance on either leg without UE support- can balance for 30 seconds on either leg with one hand support but moderate sway and crouched gait.  -walking with cane and with RW - increased stride length and stability noted.     TODAY'S TREATMENT:                                                                                                                              DATE:   02/26/2023  Therapeutic  Exercise:  Aerobic: Supine: Prone:  Seated: LAQs 2x10 5'  holds B, DF 2x15 5" holds, STS no UE 4x5, hamstring stretch x3 30" holds B  Standing:DL heel raises 2 hand support 2x10, hamstring curls 2 hand support 3x5 B, walking with cane 2 minutes - cues to use cane Neuromuscular Re-education: Manual Therapy: Therapeutic Activity: Self Care: Trigger Point Dry Needling:  Modalities:    PATIENT EDUCATION:  Education details: on HEP, on importance of AD use and HEP adherence. Person educated: Patient Education method: Explanation, Demonstration, and Handouts Education comprehension: verbalized understanding   HOME EXERCISE PROGRAM: [id number]  ASSESSMENT:  CLINICAL IMPRESSION: 02/26/2023 Patient late to apt and without AD. Discussed importance of obtaining AD secondary to fall risk. Discussed importance of HEP adherence in order to anticipate improvements in function/strength. Added seated exercises to HEP but not standing exercises. No pain just fatigue noted after session. Will continue with current POC and f/u with use of AD in community.   Eval:Patient presents to physical therapy with complaints of weakness, balance issues and decreased endurance. Patient with shuffling of feet, limited foot clearance and increased shortness of breath with walking back to treatment room. Educated patient in benefits of use of cane and RW with walking longer distance as he improved balance and reduced shortness of breath.  Patient would greatly benefit form skilled PT to improve overall function and reduce fall risk.   OBJECTIVE IMPAIRMENTS: decreased activity tolerance, decreased balance, decreased endurance, decreased knowledge of use of DME, decreased mobility, difficulty walking, decreased ROM, decreased strength, improper body mechanics, and postural dysfunction.   ACTIVITY LIMITATIONS: lifting, bending, standing, squatting, stairs, transfers, and locomotion level  PARTICIPATION LIMITATIONS:  community activity, occupation, and yard work  PERSONAL FACTORS: Age, Fitness, Time since onset of injury/illness/exacerbation, and 1 comorbidity: peripheral neuropathy  are also affecting patient's functional outcome.   REHAB POTENTIAL: Good  CLINICAL DECISION MAKING: Evolving/moderate complexity  EVALUATION COMPLEXITY: Moderate   GOALS: Goals reviewed with patient? yes  SHORT TERM GOALS: Target date: 03/27/2023   Patient will be independent in self management strategies to improve quality of life and functional outcomes. Baseline: New Program Goal status: INITIAL  2.  Patient will report at least 25% improvement in overall symptoms and/or function to demonstrate improved functional mobility Baseline: 0% better Goal status: INITIAL  3.  Patient will be able to walk for 5 minutes with or without AD without increased shortness of breath to demonstrate improved functional endurance. Baseline: unable Goal status: INITIAL     LONG TERM GOALS: Target date: 05/08/2023    Patient will report at least 50% improvement in overall symptoms and/or function to demonstrate improved functional mobility Baseline: 0% better Goal status: INITIAL  2.  Patient will be able to stand on one leg for at least 5 seconds without UE support to demonstrate improved static balance Baseline: unable Goal status: INITIAL  3.  Patient will be able to walk at least 250 feet in 2 minutes to demonstrate improved walking speed and endurance Baseline: see above Goal status: INITIAL     PLAN:  PT FREQUENCY: 1-2x/week for up to 16 visits   PT DURATION: 12 weeks  PLANNED INTERVENTIONS: Therapeutic exercises, Therapeutic activity, Neuromuscular re-education, Balance training, Gait training, Patient/Family education, Self Care, Joint mobilization, Joint manipulation, Stair training, Vestibular training, Canalith repositioning, Orthotic/Fit training, Prosthetic training, DME instructions, Aquatic Therapy,  Dry Needling, Electrical stimulation, Spinal manipulation, Spinal mobilization, Cryotherapy, Moist heat, Taping, Traction, Ultrasound, Ionotophoresis 4mg /ml Dexamethasone, Manual therapy, and Re-evaluation.  PLAN FOR NEXT SESSION: balance, MMT in LE, f/u with AD  1:58 PM, 02/26/23 Tereasa Coop, DPT Physical Therapy with [facility]

## 2023-02-26 NOTE — Patient Instructions (Addendum)
[facility] contact Please call to schedule visit and/or procedure Address: [address] [place], [place], [place] [address] Phone: [phone]   Encourage alcohol cessation for liver health- consider elastography ultrasound if liver tests not improving by follow up   You agreed to labs next visit  Avoid quinolone antibiotics like ciprofloxacin to reduce risk to aneurysm and continue to control blood pressure  - if you get severe abdominal pain seek care   Recommended follow up: Return in about 3 months (around 05/29/2023) for physical or sooner if needed.Schedule b4 you leave.

## 2023-02-26 NOTE — Progress Notes (Signed)
Phone [phone] In person visit   Subjective:   Charles Villa is a 77 y.o. year old very pleasant male patient who presents for/with See problem oriented charting Chief Complaint  Patient presents with   abdominal anuerysms    Pt wants to discuss abdominal aneurysms    Past Medical History-  Patient Active Problem List   Diagnosis Date Noted   CAD S/P percutaneous coronary angioplasty 02/24/2017    Priority: High   Alcoholism in recovery (HCC) 02/03/2017    Priority: High   History of adenomatous polyp of colon 11/02/2019    Priority: Medium    Alcoholic peripheral neuropathy (HCC) 02/09/2019    Priority: Medium    Thiamine deficiency 04/28/2017    Priority: Medium    Tremor 05/06/2016    Priority: Medium    OCD (obsessive compulsive disorder) 01/23/2016    Priority: Medium    Essential hypertension 03/11/2014    Priority: Medium    Hyperlipidemia with target low density lipoprotein (LDL) cholesterol less than 70 mg/dL 84/69/6295    Priority: Medium    PSORIASIS 02/10/2007    Priority: Medium    Senile purpura (HCC) 05/06/2019    Priority: Low   S/P cardiac catheterization 02/24/2017    Priority: Low   Anal fissure 08/17/2012    Priority: Low   SQUAMOUS CELL CARCINOMA OF SKIN SITE UNSPECIFIED 08/24/2010    Priority: Low   GERD 02/23/2007    Priority: Low   BACK PAIN, CHRONIC 02/10/2007    Priority: Low   Infrarenal abdominal aortic aneurysm (AAA) without rupture (HCC) 02/03/2023    Medications- reviewed and updated Current Outpatient Medications  Medication Sig Dispense Refill   aspirin EC 81 MG tablet Take 81 mg by mouth daily.     bisoprolol-hydrochlorothiazide (ZIAC) 2.5-6.25 MG tablet TAKE 1 TABLET BY MOUTH EVERY DAY 90 tablet 3   clobetasol (TEMOVATE) 0.05 % external solution Apply 1 application topically at bedtime as needed (scalp).   5   clobetasol cream (TEMOVATE) 0.05 % Apply 1 application topically 2 (two) times daily as needed (abdomen).   3    clotrimazole-betamethasone (LOTRISONE) cream Apply 1 application topically 2 (two) times daily as needed. psoriass     desonide (DESONATE) 0.05 % gel Apply 1 application  topically 2 (two) times daily as needed. Skin lesions     fenofibrate 160 MG tablet Take 1 tablet (160 mg total) by mouth daily. 90 tablet 3   fluocinolone (VANOS) 0.01 % cream Apply 1 application topically daily as needed.      hydrocortisone (ANUSOL-HC) 2.5 % rectal cream Place 1 application rectally 2 (two) times daily as needed for hemorrhoids or anal itching.     icosapent Ethyl (VASCEPA) 1 g capsule TAKE 2 CAPSULES BY MOUTH TWO TIMES A DAY. 120 capsule 5   pantoprazole (PROTONIX) 40 MG tablet TAKE 1 TABLET BY MOUTH EVERY DAY 90 tablet 1   rosuvastatin (CRESTOR) 40 MG tablet TAKE 1 TABLET BY MOUTH EVERY DAY 90 tablet 1   silver sulfADIAZINE (SILVADENE) 1 % cream Apply 1 application topically 2 (two) times daily as needed (groin).   5   No current facility-administered medications for this visit.     Objective:  BP 100/62   Pulse 63   Temp (!) 97.1 F (36.2 C)   Ht 5\' 11"  (1.803 m)   Wt 220 lb 6.4 oz (100 kg)   SpO2 95%   BMI 30.74 kg/m  Gen: NAD, resting comfortably CV: RRR no murmurs  rubs or gallops Lungs: CTAB no crackles, wheeze, rhonchi Ext: no edema Skin: warm, dry     Assessment and Plan   # Abdominal aortic aneurysm-infrarenal S: Patient had an emergency room visit on 01/30/2023 after a fall down some stairs when he was carrying a cooler and he felt like his legs gave out and he was having some abdominal discomfort so he ended up getting a CT of the abdomen and pelvis which was largely reassuring other than a 3.5 cm infrarenal abdominal aortic aneurysm was noted.  Recommendation was for every 2-year follow-up with ultrasound A/P: We reviewed his aortic aneurysm together including showing him imaging from the CT direct-we discussed the importance of continued follow-up every 2 years as well as the  following -We discussed the importance of blood pressure control-very well-controlled today - Discussed importance of avoiding quinolone antibiotics - Discussed typically would not have surgical intervention until much larger -encouraged to use a cane   # Falls/imbalance in patient with alcoholism-working with physical therapy.  I strongly encouraged a cane.  I also encouraged full cessation from alcohol as he is still drinking 2-3 beverages per day.  He reports taking B12 -I also encouraged use of a cane to help avoid falls -He reports some fatigue issues and I offered to update labs but he declines at this time   #CAD-follows with Dr. Rennis Golden.  Status post drug-eluting stent 2018 #hyperlipidemia-LDL goal under 70 S: Medication: Rosuvastatin 40 mg, Vascepa 2 g twice daily but only taking 1 g a day, aspirin 81 mg daily  In regards to CAD potential symptoms -no chest pain or shortness of breath reported Lab Results  Component Value Date   CHOL 165 08/15/2021   HDL 58.20 08/15/2021   LDLCALC 71 08/15/2021   LDLDIRECT 60.0 02/18/2022   TRIG 177.0 (H) 08/15/2021   CHOLHDL 3 08/15/2021    A/P: CAD asymptomatic-continue current medication.  Hyperlipidemia has been at goal other than triglycerides but has difficulty with affordability of Vascepa full dose-continue current medication  #hypertension S: medication: Ziac 2.5-6.25Mg  (half dose) BP Readings from Last 3 Encounters:  02/26/23 100/62  02/03/23 101/67  01/30/23 120/80    A/P: Blood pressure is well-controlled-honestly we could even potentially stop the medicine  Recommended follow up: Return in about 3 months (around 05/29/2023) for physical or sooner if needed.Schedule b4 you leave. Future Appointments  Date Time Provider Department Center  03/06/2023  1:45 PM Hermenia Bers, PT [facility] None  03/13/2023  1:45 PM Hermenia Bers, PT [facility] None  03/19/2023  1:45 PM Hermenia Bers, PT [facility] None  03/27/2023   1:45 PM Hermenia Bers, PT [facility] None  06/27/2023  9:30 AM [facility] ANNUAL WELLNESS VISIT 1 [facility]    Lab/Order associations: No diagnosis found.  No orders of the defined types were placed in this encounter.   Return precautions advised.  Tana Conch, MD

## 2023-03-06 ENCOUNTER — Encounter: Payer: Self-pay | Admitting: Physical Therapy

## 2023-03-06 ENCOUNTER — Ambulatory Visit: Payer: Medicare HMO | Admitting: Physical Therapy

## 2023-03-06 DIAGNOSIS — M6281 Muscle weakness (generalized): Secondary | ICD-10-CM

## 2023-03-06 DIAGNOSIS — Z9181 History of falling: Secondary | ICD-10-CM | POA: Diagnosis not present

## 2023-03-06 DIAGNOSIS — R262 Difficulty in walking, not elsewhere classified: Secondary | ICD-10-CM | POA: Diagnosis not present

## 2023-03-06 NOTE — Therapy (Addendum)
 OUTPATIENT PHYSICAL THERAPY LOWER EXTREMITY TREATMENT  PHYSICAL THERAPY DISCHARGE SUMMARY  Visits from Start of Care: 3  Current functional level related to goals / functional outcomes: Could not reassess due to unplanned discharged    Remaining deficits: Could not reassess due to unplanned discharged    Education / Equipment: Could not reassess due to unplanned discharged    Patient agrees to discharge. Patient goals were not met. Patient is being discharged due to not returning since the last visit.  9:05 AM, 10/08/23 Tereasa Coop, DPT Physical Therapy with [facility]    Patient Name: CADE DASHNER MRN: [id number] DOB:04-05-1946, 77 y.o., male Today's Date: 03/06/2023  END OF SESSION:  PT End of Session - 03/06/23 1409     Visit Number 3    Number of Visits 16    Date for PT Re-Evaluation 05/08/23    PT Start Time 1407   pt late to apt   PT Stop Time 1445    PT Time Calculation (min) 38 min    Equipment Utilized During Treatment Gait belt    Activity Tolerance No increased pain    Behavior During Therapy WFL for tasks assessed/performed             Past Medical History:  Diagnosis Date   Adenomatous colon polyp 11/02/2019   Anal fissure 08/17/2012   Really more gluteal crease irritation- protosol hc 2.5% prn    BACK PAIN, CHRONIC 02/10/2007   CAD (coronary artery disease), native coronary artery    02/24/17-PCI/DES to mLcx, normal EF.   Diverticulosis of colon    2007   GERD 02/23/2007   GERD (gastroesophageal reflux disease)    Hyperlipidemia    Hypertension    OCD (obsessive compulsive disorder) 01/23/2016   Ocd/hoarding issues- psychiatristis- [place] started on zoloft 100mg --> paxil   Personal history of colonic polyps 03/14/2014   08/2005. Benign. 10 year repeat. 2010 colonoscopy in record was entered erroneously.     PSORIASIS 02/10/2007   Topical therapy.      Scalp psoriasis    SQUAMOUS CELL CARCINOMA OF SKIN SITE UNSPECIFIED 08/24/2010    Annotation: face    Past Surgical History:  Procedure Laterality Date   COLONOSCOPY     COLONOSCOPY W/ POLYPECTOMY     2007 Dr Arlyce Dice   CORONARY STENT INTERVENTION N/A 02/24/2017   Procedure: Coronary Stent Intervention;  Surgeon: Marykay Lex, MD;  [facility] CV LAB;  Service: Cardiovascular;  Laterality: N/A;   LEFT HEART CATH AND CORONARY ANGIOGRAPHY N/A 02/24/2017   Procedure: Left Heart Cath and Coronary Angiography;  Surgeon: Marykay Lex, MD;  Location: [facility] INVASIVE CV LAB;  Service: Cardiovascular;  Laterality: N/A;   POLYPECTOMY     SKIN CANCER EXCISION     squamous cell CA - nose   TONSILLECTOMY     Patient Active Problem List   Diagnosis Date Noted   Infrarenal abdominal aortic aneurysm (AAA) without rupture (HCC) 02/03/2023   History of adenomatous polyp of colon 11/02/2019   Senile purpura (HCC) 05/06/2019   Alcoholic peripheral neuropathy (HCC) 02/09/2019   Thiamine deficiency 04/28/2017   CAD S/P percutaneous coronary angioplasty 02/24/2017   S/P cardiac catheterization 02/24/2017   Alcoholism in recovery (HCC) 02/03/2017   Tremor 05/06/2016   OCD (obsessive compulsive disorder) 01/23/2016   Essential hypertension 03/11/2014   Anal fissure 08/17/2012   SQUAMOUS CELL CARCINOMA OF SKIN SITE UNSPECIFIED 08/24/2010   GERD 02/23/2007   Hyperlipidemia with target low density lipoprotein (LDL) cholesterol  less than 70 mg/dL 65/78/4696   PSORIASIS 02/10/2007   BACK PAIN, CHRONIC 02/10/2007    PCP: Shelva Majestic, MD   REFERRING PROVIDER: Dulce Sellar, NP   REFERRING DIAG:  R53.1 (ICD-10-CM) - Weakness  [phone] (ICD-10-CM) - Abnormal gait due to peripheral sensory disorder    THERAPY DIAG:  Muscle weakness (generalized)  History of falling  Difficulty in walking, not elsewhere classified  Rationale for Evaluation and Treatment: Rehabilitation  ONSET DATE: 6 months   SUBJECTIVE:   SUBJECTIVE STATEMENT: 03/06/2023 States he  hasn't been doing his exercises and he doesn't know why he is just unmotivated. States he did some Friday and Monday.    Eval: States that he has been having progressively reduced function over the last 6 months. States that he gets short of breath, difficulty weed eating for more than 5 minutes at a time. Reports he has had one fall recently carrying a heavy cooler but no falls otherwise. States he has a BP cuff and it runs low. States he has balance issues.   States that he also has peripheral neruopathy and wants to improve his balance and strength     PERTINENT HISTORY: CAD PAIN:  Are you having pain? No  PRECAUTIONS: None  RED FLAGS: None   WEIGHT BEARING RESTRICTIONS: No  FALLS:  Has patient fallen in last 6 months? Yes. Number of falls 1  LIVING ENVIRONMENT: Lives with: lives with their spouse Lives in: House/apartment Stairs: Yes: Internal: 14 steps; on left going up 4 outside left side railing going up  Has following equipment at home: None  OCCUPATION: consulting PRN- occasional travel   PLOF: Independent with basic ADLs  PATIENT GOALS: improved balance and endurance  NEXT MD VISIT: 02/26/23  OBJECTIVE:   DIAGNOSTIC FINDINGS: recent CT - see uploads   COGNITION: Overall cognitive status: Within functional limits for tasks assessed     SENSATION: Not tested  EDEMA:  None present   Observation: Shuffles feet, resting tremor noted in right hand, crouched gait    LE Measurements Lower Extremity Right EVAL Left EVAL   A/PROM MMT A/PROM MMT  Hip Flexion  4  4  Hip Extension      Hip Abduction      Hip Adduction      Hip Internal rotation      Hip External rotation      Knee Flexion  4  4  Knee Extension  4  4  Ankle Dorsiflexion  4-  4  Ankle Plantarflexion      Ankle Inversion      Ankle Eversion       (Blank rows = not tested) * pain    FUNCTIONAL TESTS:  5x STS - 16.53 seconds no UE support test - 175 feet - shuffles gait,  minimal hip/knee/ankle ROM bent slouched gait  SLS - unable to balance on either leg without UE support- can balance for 30 seconds on either leg with one hand support but moderate sway and crouched gait.  -walking with cane and with RW - increased stride length and stability noted.     TODAY'S TREATMENT:  DATE:   03/06/2023  Therapeutic Exercise:  Aerobic: Supine: Prone:  Seated: LAQs 2x12 5' holds B, DF 2 minutes total 5" holds, heel raises 2 minutes, STS no UE 4x5, hamstring stretch x3 30" holds B  Standing:4" step ups 2 hand support 4 minutes total, marching with one hand support 3x10 B Neuromuscular Re-education: Manual Therapy: Therapeutic Activity: Self Care: Trigger Point Dry Needling:  Modalities:    PATIENT EDUCATION:  Education details: on HEP, on importance of adherence to HEP, on trying to schedule his seated exercises during commercial breaks during morning TV. Person educated: Patient Education method: Explanation, Demonstration, and Handouts Education comprehension: verbalized understanding   HOME EXERCISE PROGRAM: [phone]  ASSESSMENT:  CLINICAL IMPRESSION: 03/06/2023 Session focused on review of HEP and brain storming ways to incorporate exercises into daily routine as this is very difficult for patient. Discussed performing seated exercises in the AM during morning television breaks (commercials). Encouraged patient to use cane at home and in the community. Rest breaks taken throughout session secondary to fatigue. Will continue with current POC and f/u with use of cane and HEP adherence in future sessions.  Eval:Patient presents to physical therapy with complaints of weakness, balance issues and decreased endurance. Patient with shuffling of feet, limited foot clearance and increased shortness of breath with walking back to treatment  room. Educated patient in benefits of use of cane and RW with walking longer distance as he improved balance and reduced shortness of breath.  Patient would greatly benefit form skilled PT to improve overall function and reduce fall risk.   OBJECTIVE IMPAIRMENTS: decreased activity tolerance, decreased balance, decreased endurance, decreased knowledge of use of DME, decreased mobility, difficulty walking, decreased ROM, decreased strength, improper body mechanics, and postural dysfunction.   ACTIVITY LIMITATIONS: lifting, bending, standing, squatting, stairs, transfers, and locomotion level  PARTICIPATION LIMITATIONS: community activity, occupation, and yard work  PERSONAL FACTORS: Age, Fitness, Time since onset of injury/illness/exacerbation, and 1 comorbidity: peripheral neuropathy  are also affecting patient's functional outcome.   REHAB POTENTIAL: Good  CLINICAL DECISION MAKING: Evolving/moderate complexity  EVALUATION COMPLEXITY: Moderate   GOALS: Goals reviewed with patient? yes  SHORT TERM GOALS: Target date: 03/27/2023   Patient will be independent in self management strategies to improve quality of life and functional outcomes. Baseline: New Program Goal status: INITIAL  2.  Patient will report at least 25% improvement in overall symptoms and/or function to demonstrate improved functional mobility Baseline: 0% better Goal status: INITIAL  3.  Patient will be able to walk for 5 minutes with or without AD without increased shortness of breath to demonstrate improved functional endurance. Baseline: unable Goal status: INITIAL     LONG TERM GOALS: Target date: 05/08/2023    Patient will report at least 50% improvement in overall symptoms and/or function to demonstrate improved functional mobility Baseline: 0% better Goal status: INITIAL  2.  Patient will be able to stand on one leg for at least 5 seconds without UE support to demonstrate improved static  balance Baseline: unable Goal status: INITIAL  3.  Patient will be able to walk at least 250 feet in 2 minutes to demonstrate improved walking speed and endurance Baseline: see above Goal status: INITIAL     PLAN:  PT FREQUENCY: 1-2x/week for up to 16 visits   PT DURATION: 12 weeks  PLANNED INTERVENTIONS: Therapeutic exercises, Therapeutic activity, Neuromuscular re-education, Balance training, Gait training, Patient/Family education, Self Care, Joint mobilization, Joint manipulation, Stair training, Vestibular training, Canalith repositioning, Orthotic/Fit training, Prosthetic  training, DME instructions, Aquatic Therapy, Dry Needling, Electrical stimulation, Spinal manipulation, Spinal mobilization, Cryotherapy, Moist heat, Taping, Traction, Ultrasound, Ionotophoresis 4mg /ml Dexamethasone, Manual therapy, and Re-evaluation.  PLAN FOR NEXT SESSION: balance, MMT in LE, f/u with AD    2:46 PM, 03/06/23 Tereasa Coop, DPT Physical Therapy with [facility]

## 2023-03-13 ENCOUNTER — Encounter: Payer: Medicare HMO | Admitting: Physical Therapy

## 2023-03-19 ENCOUNTER — Encounter: Payer: Medicare HMO | Admitting: Physical Therapy

## 2023-03-27 ENCOUNTER — Encounter: Payer: Medicare HMO | Admitting: Physical Therapy

## 2023-04-03 ENCOUNTER — Encounter: Payer: Self-pay | Admitting: Family Medicine

## 2023-04-04 ENCOUNTER — Other Ambulatory Visit: Payer: Self-pay

## 2023-04-04 ENCOUNTER — Other Ambulatory Visit: Payer: Self-pay | Admitting: Family Medicine

## 2023-04-04 DIAGNOSIS — R0602 Shortness of breath: Secondary | ICD-10-CM

## 2023-04-08 DIAGNOSIS — H2513 Age-related nuclear cataract, bilateral: Secondary | ICD-10-CM | POA: Diagnosis not present

## 2023-04-08 DIAGNOSIS — H25013 Cortical age-related cataract, bilateral: Secondary | ICD-10-CM | POA: Diagnosis not present

## 2023-04-08 DIAGNOSIS — I10 Essential (primary) hypertension: Secondary | ICD-10-CM | POA: Diagnosis not present

## 2023-04-08 DIAGNOSIS — H25043 Posterior subcapsular polar age-related cataract, bilateral: Secondary | ICD-10-CM | POA: Diagnosis not present

## 2023-04-08 DIAGNOSIS — H2512 Age-related nuclear cataract, left eye: Secondary | ICD-10-CM | POA: Diagnosis not present

## 2023-04-10 ENCOUNTER — Telehealth: Payer: Self-pay | Admitting: Internal Medicine

## 2023-04-10 NOTE — Telephone Encounter (Signed)
Patient SOB 4-6 months.  States same every day. He states his wife told him to call.  States it is with exertion not at rest. No other symptoms.  He does state worsened since last visit in May.  Appt made for next week and ED precautions given

## 2023-04-10 NOTE — Telephone Encounter (Signed)
Pt c/o Shortness Of Breath: STAT if SOB developed within the last 24 hours or pt is noticeably SOB on the phone  1. Are you currently SOB (can you hear that pt is SOB on the phone)? Yes   2. How long have you been experiencing SOB? 4-6 months   3. Are you SOB when sitting or when up moving around? Moving around   4. Are you currently experiencing any other symptoms? Fatigued

## 2023-04-15 ENCOUNTER — Encounter: Payer: Self-pay | Admitting: Physician Assistant

## 2023-04-15 ENCOUNTER — Ambulatory Visit: Payer: Medicare HMO | Attending: Physician Assistant | Admitting: Physician Assistant

## 2023-04-15 VITALS — BP 100/62 | HR 80 | Ht 71.0 in | Wt 224.0 lb

## 2023-04-15 DIAGNOSIS — E785 Hyperlipidemia, unspecified: Secondary | ICD-10-CM | POA: Diagnosis not present

## 2023-04-15 DIAGNOSIS — I10 Essential (primary) hypertension: Secondary | ICD-10-CM

## 2023-04-15 DIAGNOSIS — I2510 Atherosclerotic heart disease of native coronary artery without angina pectoris: Secondary | ICD-10-CM | POA: Diagnosis not present

## 2023-04-15 DIAGNOSIS — F1010 Alcohol abuse, uncomplicated: Secondary | ICD-10-CM | POA: Diagnosis not present

## 2023-04-15 DIAGNOSIS — R0609 Other forms of dyspnea: Secondary | ICD-10-CM | POA: Diagnosis not present

## 2023-04-15 NOTE — Progress Notes (Unsigned)
Cardiology Office Note:  .   Date:  04/16/2023  ID:  Charles Villa, DOB 12-13-1945, MRN [id number] PCP: Shelva Majestic, MD  [facility] Providers Cardiologist:  Chrystie Nose, MD     History of Present Illness: .   Charles Villa is a 77 y.o. male with a hx of CAD, hypertension, hyperlipidemia, GERD, chronic back pain, longstanding alcohol use and psoriasis.  Echocardiogram obtained on 02/18/2017 showed EF 55 to 60%, no regional wall motion abnormality, grade 1 DD, trivial TR.  Exercise tolerance test obtained on 02/18/2017 showed horizontal ST segment depression noted in the inferolateral leads.  Subsequent cardiac catheterization performed on 02/24/2017 showed 90% mid left circumflex lesion treated with 3.5 x 16 mm DES, EF 55 to 65%.  He is on Vascepa for hypertriglyceridemia.  He was last seen by Dr. Rennis Golden in April 2022 at which time he has cut back on alcohol use significantly.  EKG at the time showed incomplete right bundle branch block, normal sinus rhythm.   Patient was seen by Sharlene Dory on 04/17/2022 at which time he reported shooting pain that goes from the left side of his neck radiating to the occipital side of the skull lasting 30 seconds to a couple minutes. If the symptom occurs 3-4 times per week. He has chronic dyspnea on exertion which has not worsened. Echocardiogram was obtained on 04/26/2022 which showed EF 60 to 65%, no regional wall motion abnormality, grade 1 DD, no significant valve issue.  When he was followed up in April 2024 by Carlos Levering NP, he continued to describe dyspnea on exertion, fatigue and exercise intolerance.  Subsequent Myoview obtained on 11/22/2022 was low risk, no evidence of ischemia or infarction.  EF 63%.  He was last seen by Gavin Pound in May 2024 at which time he continued to have exercise intolerance.  1 year follow-up was recommended.  He was later seen in the ED in July 2024 due to fall.  Blood work showed creatinine was 1.54.   Hemoglobin normal.  CT of abdomen and pelvis showed a 3.5 cm infrarenal abdominal aortic aneurysm, recommend follow-up study in 2 years.  Diverticular study of the colon without acute inflammation, hepatic steatosis.  Patient presents today for follow-up.  EKG continued to show sinus rhythm with right bundle branch block.  Blood pressure borderline low, and has been borderline low for the past 2 months.  He is only taking half a tablet of the Ziac.  I eventually recommended stopping the Ziac completely and monitor blood pressure at home.  Systolic blood pressure goal between 110 to 135 mmHg.  He denies any chest pain.  His fatigue and shortness of breath with exertion has been going on for the past year.  Recent TSH obtained in March 2024 was normal.  He has been referred to see pulmonology service.  Although we can consider cardiopulmonary stress test at some point, however I would prefer to wait until he finish pulmonary evaluation first before considering additional study.  I discussed the case with DOD Dr. Jens Som who is agreeable with this plan.  ROS:   Patient complains of fatigue and shortness of breath but no chest pain  Studies Reviewed: [name]   EKG Interpretation Date/Time:  Tuesday April 15 2023 09:46:54 EDT Ventricular Rate:  54 PR Interval:  182 QRS Duration:  142 QT Interval:  466 QTC Calculation: 441 R Axis:   -3  Text Interpretation: Sinus bradycardia Right bundle branch block Confirmed by Azalee Course (  Cardiology Office Note:  .   Date:  04/16/2023  ID:  Charles Villa, DOB 12-13-1945, MRN [id number] PCP: Shelva Majestic, MD  [facility] Providers Cardiologist:  Chrystie Nose, MD     History of Present Illness: .   Charles Villa is a 77 y.o. male with a hx of CAD, hypertension, hyperlipidemia, GERD, chronic back pain, longstanding alcohol use and psoriasis.  Echocardiogram obtained on 02/18/2017 showed EF 55 to 60%, no regional wall motion abnormality, grade 1 DD, trivial TR.  Exercise tolerance test obtained on 02/18/2017 showed horizontal ST segment depression noted in the inferolateral leads.  Subsequent cardiac catheterization performed on 02/24/2017 showed 90% mid left circumflex lesion treated with 3.5 x 16 mm DES, EF 55 to 65%.  He is on Vascepa for hypertriglyceridemia.  He was last seen by Dr. Rennis Golden in April 2022 at which time he has cut back on alcohol use significantly.  EKG at the time showed incomplete right bundle branch block, normal sinus rhythm.   Patient was seen by Sharlene Dory on 04/17/2022 at which time he reported shooting pain that goes from the left side of his neck radiating to the occipital side of the skull lasting 30 seconds to a couple minutes. If the symptom occurs 3-4 times per week. He has chronic dyspnea on exertion which has not worsened. Echocardiogram was obtained on 04/26/2022 which showed EF 60 to 65%, no regional wall motion abnormality, grade 1 DD, no significant valve issue.  When he was followed up in April 2024 by Carlos Levering NP, he continued to describe dyspnea on exertion, fatigue and exercise intolerance.  Subsequent Myoview obtained on 11/22/2022 was low risk, no evidence of ischemia or infarction.  EF 63%.  He was last seen by Gavin Pound in May 2024 at which time he continued to have exercise intolerance.  1 year follow-up was recommended.  He was later seen in the ED in July 2024 due to fall.  Blood work showed creatinine was 1.54.   Hemoglobin normal.  CT of abdomen and pelvis showed a 3.5 cm infrarenal abdominal aortic aneurysm, recommend follow-up study in 2 years.  Diverticular study of the colon without acute inflammation, hepatic steatosis.  Patient presents today for follow-up.  EKG continued to show sinus rhythm with right bundle branch block.  Blood pressure borderline low, and has been borderline low for the past 2 months.  He is only taking half a tablet of the Ziac.  I eventually recommended stopping the Ziac completely and monitor blood pressure at home.  Systolic blood pressure goal between 110 to 135 mmHg.  He denies any chest pain.  His fatigue and shortness of breath with exertion has been going on for the past year.  Recent TSH obtained in March 2024 was normal.  He has been referred to see pulmonology service.  Although we can consider cardiopulmonary stress test at some point, however I would prefer to wait until he finish pulmonary evaluation first before considering additional study.  I discussed the case with DOD Dr. Jens Som who is agreeable with this plan.  ROS:   Patient complains of fatigue and shortness of breath but no chest pain  Studies Reviewed: [name]   EKG Interpretation Date/Time:  Tuesday April 15 2023 09:46:54 EDT Ventricular Rate:  54 PR Interval:  182 QRS Duration:  142 QT Interval:  466 QTC Calculation: 441 R Axis:   -3  Text Interpretation: Sinus bradycardia Right bundle branch block Confirmed by Azalee Course (  size is normal. End systolic cavity size is normal.   Prior study not available for comparison.  Normal resting and stress perfusion. No ischemia or infarction EF 63%   ECHOCARDIOGRAM  ECHOCARDIOGRAM COMPLETE 04/26/2022  Narrative ECHOCARDIOGRAM REPORT    Patient Name:   KAZUO DURAY Date of Exam: 04/26/2022 Medical Rec #:  [id number]        Height:       71.0 in Accession #:    [id number]       Weight:       227.2 lb Date of Birth:  [birth year]        BSA:          2.226 m Patient Age:    76 years         BP:           106/66 mmHg Patient Gender: M                HR:           58 bpm. Exam Location:   [address]  Procedure: 2D Echo, Cardiac Doppler, Color Doppler and Intracardiac Opacification Agent  Indications:    r06.09 Dyspnea  History:        Patient has prior history of Echocardiogram examinations, most recent 02/18/2017. CAD; Risk Factors:Hypertension and Dyslipidemia.  Sonographer:    Daphine Deutscher RDCS Referring Phys: [id number] ELIZABETH PECK  IMPRESSIONS   1. Left ventricular ejection fraction, by estimation, is 60 to 65%. The left ventricle has normal function. The left ventricle has no regional wall motion abnormalities. Left ventricular diastolic parameters are consistent with Grade I diastolic dysfunction (impaired relaxation). 2. Right ventricular systolic function is normal. The right ventricular size is normal. Tricuspid regurgitation signal is inadequate for assessing PA pressure. 3. The mitral valve is normal in structure. No evidence of mitral valve regurgitation. No evidence of mitral stenosis. 4. The aortic valve is grossly normal. Aortic valve regurgitation is not visualized. No aortic stenosis is present. 5. The inferior vena cava is normal in size with greater than 50% respiratory variability, suggesting right atrial pressure of 3 mmHg.  Conclusion(s)/Recommendation(s): Normal biventricular function without evidence of hemodynamically significant valvular heart disease.  FINDINGS Left Ventricle: Left ventricular ejection fraction, by estimation, is 60 to 65%. The left ventricle has normal function. The left ventricle has no regional wall motion abnormalities. Definity contrast agent was given IV to delineate the left ventricular endocardial borders. The left ventricular internal cavity size was normal in size. There is no left ventricular hypertrophy. Left ventricular diastolic parameters are consistent with Grade I diastolic dysfunction (impaired relaxation).  Right Ventricle: The right ventricular size is normal. No increase in right ventricular  wall thickness. Right ventricular systolic function is normal. Tricuspid regurgitation signal is inadequate for assessing PA pressure.  Left Atrium: Left atrial size was normal in size.  Right Atrium: Right atrial size was normal in size.  Pericardium: There is no evidence of pericardial effusion.  Mitral Valve: The mitral valve is normal in structure. No evidence of mitral valve regurgitation. No evidence of mitral valve stenosis.  Tricuspid Valve: The tricuspid valve is normal in structure. Tricuspid valve regurgitation is trivial. No evidence of tricuspid stenosis.  Aortic Valve: The aortic valve is grossly normal. Aortic valve regurgitation is not visualized. No aortic stenosis is present.  Pulmonic Valve: The pulmonic valve was normal in structure. Pulmonic valve regurgitation is trivial. No evidence of pulmonic stenosis.  Aorta: The aortic root is normal in  size is normal. End systolic cavity size is normal.   Prior study not available for comparison.  Normal resting and stress perfusion. No ischemia or infarction EF 63%   ECHOCARDIOGRAM  ECHOCARDIOGRAM COMPLETE 04/26/2022  Narrative ECHOCARDIOGRAM REPORT    Patient Name:   KAZUO DURAY Date of Exam: 04/26/2022 Medical Rec #:  [id number]        Height:       71.0 in Accession #:    [id number]       Weight:       227.2 lb Date of Birth:  [birth year]        BSA:          2.226 m Patient Age:    76 years         BP:           106/66 mmHg Patient Gender: M                HR:           58 bpm. Exam Location:   [address]  Procedure: 2D Echo, Cardiac Doppler, Color Doppler and Intracardiac Opacification Agent  Indications:    r06.09 Dyspnea  History:        Patient has prior history of Echocardiogram examinations, most recent 02/18/2017. CAD; Risk Factors:Hypertension and Dyslipidemia.  Sonographer:    Daphine Deutscher RDCS Referring Phys: [id number] ELIZABETH PECK  IMPRESSIONS   1. Left ventricular ejection fraction, by estimation, is 60 to 65%. The left ventricle has normal function. The left ventricle has no regional wall motion abnormalities. Left ventricular diastolic parameters are consistent with Grade I diastolic dysfunction (impaired relaxation). 2. Right ventricular systolic function is normal. The right ventricular size is normal. Tricuspid regurgitation signal is inadequate for assessing PA pressure. 3. The mitral valve is normal in structure. No evidence of mitral valve regurgitation. No evidence of mitral stenosis. 4. The aortic valve is grossly normal. Aortic valve regurgitation is not visualized. No aortic stenosis is present. 5. The inferior vena cava is normal in size with greater than 50% respiratory variability, suggesting right atrial pressure of 3 mmHg.  Conclusion(s)/Recommendation(s): Normal biventricular function without evidence of hemodynamically significant valvular heart disease.  FINDINGS Left Ventricle: Left ventricular ejection fraction, by estimation, is 60 to 65%. The left ventricle has normal function. The left ventricle has no regional wall motion abnormalities. Definity contrast agent was given IV to delineate the left ventricular endocardial borders. The left ventricular internal cavity size was normal in size. There is no left ventricular hypertrophy. Left ventricular diastolic parameters are consistent with Grade I diastolic dysfunction (impaired relaxation).  Right Ventricle: The right ventricular size is normal. No increase in right ventricular  wall thickness. Right ventricular systolic function is normal. Tricuspid regurgitation signal is inadequate for assessing PA pressure.  Left Atrium: Left atrial size was normal in size.  Right Atrium: Right atrial size was normal in size.  Pericardium: There is no evidence of pericardial effusion.  Mitral Valve: The mitral valve is normal in structure. No evidence of mitral valve regurgitation. No evidence of mitral valve stenosis.  Tricuspid Valve: The tricuspid valve is normal in structure. Tricuspid valve regurgitation is trivial. No evidence of tricuspid stenosis.  Aortic Valve: The aortic valve is grossly normal. Aortic valve regurgitation is not visualized. No aortic stenosis is present.  Pulmonic Valve: The pulmonic valve was normal in structure. Pulmonic valve regurgitation is trivial. No evidence of pulmonic stenosis.  Aorta: The aortic root is normal in  size is normal. End systolic cavity size is normal.   Prior study not available for comparison.  Normal resting and stress perfusion. No ischemia or infarction EF 63%   ECHOCARDIOGRAM  ECHOCARDIOGRAM COMPLETE 04/26/2022  Narrative ECHOCARDIOGRAM REPORT    Patient Name:   KAZUO DURAY Date of Exam: 04/26/2022 Medical Rec #:  [id number]        Height:       71.0 in Accession #:    [id number]       Weight:       227.2 lb Date of Birth:  [birth year]        BSA:          2.226 m Patient Age:    76 years         BP:           106/66 mmHg Patient Gender: M                HR:           58 bpm. Exam Location:   [address]  Procedure: 2D Echo, Cardiac Doppler, Color Doppler and Intracardiac Opacification Agent  Indications:    r06.09 Dyspnea  History:        Patient has prior history of Echocardiogram examinations, most recent 02/18/2017. CAD; Risk Factors:Hypertension and Dyslipidemia.  Sonographer:    Daphine Deutscher RDCS Referring Phys: [id number] ELIZABETH PECK  IMPRESSIONS   1. Left ventricular ejection fraction, by estimation, is 60 to 65%. The left ventricle has normal function. The left ventricle has no regional wall motion abnormalities. Left ventricular diastolic parameters are consistent with Grade I diastolic dysfunction (impaired relaxation). 2. Right ventricular systolic function is normal. The right ventricular size is normal. Tricuspid regurgitation signal is inadequate for assessing PA pressure. 3. The mitral valve is normal in structure. No evidence of mitral valve regurgitation. No evidence of mitral stenosis. 4. The aortic valve is grossly normal. Aortic valve regurgitation is not visualized. No aortic stenosis is present. 5. The inferior vena cava is normal in size with greater than 50% respiratory variability, suggesting right atrial pressure of 3 mmHg.  Conclusion(s)/Recommendation(s): Normal biventricular function without evidence of hemodynamically significant valvular heart disease.  FINDINGS Left Ventricle: Left ventricular ejection fraction, by estimation, is 60 to 65%. The left ventricle has normal function. The left ventricle has no regional wall motion abnormalities. Definity contrast agent was given IV to delineate the left ventricular endocardial borders. The left ventricular internal cavity size was normal in size. There is no left ventricular hypertrophy. Left ventricular diastolic parameters are consistent with Grade I diastolic dysfunction (impaired relaxation).  Right Ventricle: The right ventricular size is normal. No increase in right ventricular  wall thickness. Right ventricular systolic function is normal. Tricuspid regurgitation signal is inadequate for assessing PA pressure.  Left Atrium: Left atrial size was normal in size.  Right Atrium: Right atrial size was normal in size.  Pericardium: There is no evidence of pericardial effusion.  Mitral Valve: The mitral valve is normal in structure. No evidence of mitral valve regurgitation. No evidence of mitral valve stenosis.  Tricuspid Valve: The tricuspid valve is normal in structure. Tricuspid valve regurgitation is trivial. No evidence of tricuspid stenosis.  Aortic Valve: The aortic valve is grossly normal. Aortic valve regurgitation is not visualized. No aortic stenosis is present.  Pulmonic Valve: The pulmonic valve was normal in structure. Pulmonic valve regurgitation is trivial. No evidence of pulmonic stenosis.  Aorta: The aortic root is normal in

## 2023-04-15 NOTE — Patient Instructions (Signed)
Medication Instructions:  STOP TAKING BISOPROLOL-HYDROCHLOROTHIAZIDE(ZIAC).  *If you need a refill on your cardiac medications before your next appointment, please call your pharmacy*   Lab Work: NO LABS If you have labs (blood work) drawn today and your tests are completely normal, you will receive your results only by: MyChart Message (if you have MyChart) OR A paper copy in the mail If you have any lab test that is abnormal or we need to change your treatment, we will call you to review the results.   Testing/Procedures: NO TESTING   Follow-Up: At [facility], you and your health needs are our priority.  As part of our continuing mission to provide you with exceptional heart care, we have created designated Provider Care Teams.  These Care Teams include your primary Cardiologist (physician) and Advanced Practice Providers (APPs -  Physician Assistants and Nurse Practitioners) who all work together to provide you with the care you need, when you need it.   Your next appointment:   3-4 month(s)  Provider:   Chrystie Nose, MD

## 2023-06-09 ENCOUNTER — Telehealth: Payer: Self-pay | Admitting: Internal Medicine

## 2023-06-09 DIAGNOSIS — H2512 Age-related nuclear cataract, left eye: Secondary | ICD-10-CM | POA: Diagnosis not present

## 2023-06-09 NOTE — Telephone Encounter (Signed)
Patients wife came by and dropped off EKG Upfront to be scanned in

## 2023-06-09 NOTE — Telephone Encounter (Signed)
Patient c/o Palpitations:  STAT if patient reporting lightheadedness, shortness of breath, or chest pain  How long have you had palpitations/irregular HR/ Afib? Doesn't know know how log.  Are you having the symptoms now? Patient was told he was currently in A-fib.   Are you currently experiencing lightheadedness, SOB or CP? no  Do you have a history of afib (atrial fibrillation) or irregular heart rhythm? no  Have you checked your BP or HR? (document readings if available): wife doesn't know.    Are you experiencing any other symptoms? No  She states they were at the surgical center at [facility] for cataract surgery, they did an EKG and it showed he was in A-FIB.

## 2023-06-09 NOTE — Telephone Encounter (Signed)
Patient identification verified by 2 forms. Marilynn Rail, RN   Called and spoke to patient/wife  Patient states:   -dropped off abnormal EKG to office   -while completing pre-op visit was found in a new afib  -EKG was completed this morning, very very shaky at the time   -has previously presented to OV for weakness and SOB   -does not have diagnosis on afib   -continues to have DOE   -feels fine right now, no issues/complaints   Patient denies:   -SOB/difficulty breathing at rest   -heart palpitation/racing  Informed patient: -reviewed Symptoms and EKG with DOD Dr. Servando Salina, provider advise to present to OV tomorrow -EKG that was dropped off can't be scanned into chart, can pick up at front desk  Pt scheduled for OV 11/12 at 2:45pm with PA Lisabeth Devoid  Reviewed ED warning signs/precautions  Patient verbalized understanding, no questions at this time

## 2023-06-10 ENCOUNTER — Ambulatory Visit: Payer: Medicare HMO | Attending: Physician Assistant | Admitting: Physician Assistant

## 2023-06-10 ENCOUNTER — Encounter: Payer: Self-pay | Admitting: Physician Assistant

## 2023-06-10 ENCOUNTER — Ambulatory Visit: Payer: Medicare HMO | Attending: Physician Assistant

## 2023-06-10 VITALS — BP 116/72 | HR 75 | Ht 71.0 in | Wt 223.0 lb

## 2023-06-10 DIAGNOSIS — Z9861 Coronary angioplasty status: Secondary | ICD-10-CM

## 2023-06-10 DIAGNOSIS — E785 Hyperlipidemia, unspecified: Secondary | ICD-10-CM

## 2023-06-10 DIAGNOSIS — Z79899 Other long term (current) drug therapy: Secondary | ICD-10-CM

## 2023-06-10 DIAGNOSIS — I2510 Atherosclerotic heart disease of native coronary artery without angina pectoris: Secondary | ICD-10-CM

## 2023-06-10 DIAGNOSIS — I7143 Infrarenal abdominal aortic aneurysm, without rupture: Secondary | ICD-10-CM | POA: Diagnosis not present

## 2023-06-10 DIAGNOSIS — R0609 Other forms of dyspnea: Secondary | ICD-10-CM | POA: Diagnosis not present

## 2023-06-10 DIAGNOSIS — I4892 Unspecified atrial flutter: Secondary | ICD-10-CM

## 2023-06-10 DIAGNOSIS — I10 Essential (primary) hypertension: Secondary | ICD-10-CM | POA: Diagnosis not present

## 2023-06-10 MED ORDER — APIXABAN 5 MG PO TABS: 5.0000 mg | ORAL_TABLET | Freq: Two times a day (BID) | ORAL | 11 refills | Status: DC

## 2023-06-10 MED ORDER — FENOFIBRATE 160 MG PO TABS: 160.0000 mg | ORAL_TABLET | Freq: Every day | ORAL | 3 refills | Status: DC

## 2023-06-10 NOTE — Progress Notes (Unsigned)
Enrolled for [employer] to mail a ZIO XT long term holter monitor to the patients address on file.   Requested expedited shipping.  Patient needs to receive no later than Friday.  Dr. Rennis Golden to read.

## 2023-06-10 NOTE — Progress Notes (Signed)
Cardiology Office Note:  .   Date:  06/10/2023  ID:  Charles Villa, DOB 20-Jan-1946, MRN [id number] PCP: Shelva Majestic, MD  [facility] Providers Cardiologist:  Chrystie Nose, MD     History of Present Illness: .   Charles Villa is a 77 y.o. male with a hx of CAD, hypertension, hyperlipidemia, GERD, chronic back pain, longstanding alcohol use and psoriasis.  Echocardiogram obtained on 02/18/2017 showed EF 55 to 60%, no regional wall motion abnormality, grade 1 DD, trivial TR.  Exercise tolerance test obtained on 02/18/2017 showed horizontal ST segment depression noted in the inferolateral leads.  Subsequent cardiac catheterization performed on 02/24/2017 showed 90% mid left circumflex lesion treated with 3.5 x 16 mm DES, EF 55 to 65%.  He is on Vascepa for hypertriglyceridemia.  He was last seen by Dr. Rennis Golden in April 2022 at which time he has cut back on alcohol use significantly.  EKG at the time showed incomplete right bundle branch block, normal sinus rhythm.   Patient was seen by Sharlene Dory on 04/17/2022 at which time he reported shooting pain that goes from the left side of his neck radiating to the occipital side of the skull lasting 30 seconds to a couple minutes. If the symptom occurs 3-4 times per week. He has chronic dyspnea on exertion which has not worsened. Echocardiogram was obtained on 04/26/2022 which showed EF 60 to 65%, no regional wall motion abnormality, grade 1 DD, no significant valve issue.  When he was followed up in April 2024 by Carlos Levering NP, he continued to describe dyspnea on exertion, fatigue and exercise intolerance.  Subsequent Myoview obtained on 11/22/2022 was low risk, no evidence of ischemia or infarction.  EF 63%.  He was last seen by Gavin Pound in May 2024 at which time he continued to have exercise intolerance.  1 year follow-up was recommended.  He was later seen in the ED in July 2024 due to fall.  Blood work showed creatinine was 1.54.   Hemoglobin normal.  CT of abdomen and pelvis showed a 3.5 cm infrarenal abdominal aortic aneurysm, recommend follow-up study in 2 years.  Diverticulosis of the colon without acute inflammation, hepatic steatosis.   I saw the patient in the office on 04/15/2023 at which time his EKG showed sinus rhythm with right bundle branch block.  Blood pressure was borderline low.  He was only taking half a tablet of Ziac.  I recommended stopping the Ziac completely.  He also complained of fatigue and shortness of breath with exertion.  TSH obtained in March 2024 was normal.  Due to his dyspnea, he was referred to see pulmonology service.  He went to undergo cataract surgery yesterday however surgery was delayed after anesthesia noted the irregular heartbeat.  Subsequent EKG demonstrated atrial flutter with heart rate of 97 bpm.  He presents today for reevaluation.  EKG today shows he has converted back to normal sinus rhythm.  He has no cardiac awareness of atrial flutter.  Even when he was in atrial flutter, his heart rate was fairly controlled.  We discussed risk and benefit of anticoagulation therapy.  Unfortunately he is still drink 2 to 4 glass of gin every day.  He also has fallen 2 weeks ago and cracked his teeth.  I emphasized that he need to stop drinking alcohol to avoid recurrent falls.  I recommend start on 5 mg twice a day of Eliquis.  He will need CBC in 2 weeks for reassess  the hemoglobin.  I right also recommended 2-week heart monitor to assess aflutter burden.  I plan to see the patient back in 6 weeks for follow-up.   ROS:   Patient has chronic dyspnea on exertion.  He denies any chest pain.  He has no lower extremity edema, orthopnea or PND.  Studies Reviewed: [name]   EKG Interpretation Date/Time:  Tuesday June 10 2023 14:56:23 EST Ventricular Rate:  75 PR Interval:  162 QRS Duration:  134 QT Interval:  412 QTC Calculation: 460 R Axis:   13  Text Interpretation: Normal sinus rhythm Right  bundle branch block When compared with ECG of 15-Apr-2023 09:46, No significant change was found Confirmed by Azalee Course [phone]) on 06/10/2023 9:52:44 PM    Cardiac Studies & Procedures   CARDIAC CATHETERIZATION  CARDIAC CATHETERIZATION 02/24/2017  Narrative Images from the original result were not included.   Mid Cx lesion, 90 %stenosed.  A STENT PROMUS PREM MR 3.5X16 drug eluting stent was successfully placed.  Post intervention, there is a 0% residual stenosis.  _______________  Moderate disease in the ostial and mid LAD with proximal-mid ectasia  The left ventricular systolic function is normal. The left ventricular ejection fraction is 55-65% by visual estimate.  LV end diastolic pressure is normal.  Severe focal single vessel disease involving the mid circumflex just prior to large lateral OM branch. Successful PCI DES.  Plan:  Same-day discharged today on dual antiplatelet therapy for minimum one year.  Patient will likely need titration of his anti-hypertensive agents as well as cardiac risk factor modification.  He will follow with Dr. Rennis Golden.   Bryan Lemma, M.D., M.S. Interventional Cardiologist  Pager # [phone] Phone # [phone] [address] [place], [place] [address]  Findings Coronary Findings Diagnostic  Dominance: Right  Left Main Vessel is large. Vessel is angiographically normal.  Left Anterior Descending In the proximal to midportion is a patulous/ectatic appearing after the initial ostial &quot;lesions&quot; that appears to really be the true size of the vessel. Beyond the mid &quot;40% &quot;lesion, the vessel tapers to a relatively small caliber vessel to before it reaches the apex The lesion is focal and concentric. The lesion is focal.  First Septal Branch Vessel is small in size.  Second Diagonal Branch Vessel is small in size.  Second Septal Branch Vessel is small in size.  Third Septal Branch Vessel is  small in size.  Ramus Intermedius Vessel is moderate in size. The lesion is eccentric.  Lateral Ramus Intermedius Vessel is small in size.  Left Circumflex Vessel is large. The lesion is located proximal to the major branch, focal and eccentric.  First Obtuse Marginal Branch Vessel is small in size.  Lateral Second Obtuse Marginal Branch Vessel is small in size.  Right Coronary Artery Vessel is large. Somewhat ectatic vessel The lesion is segmental. Smooth, diffuse  Acute Marginal Branch Vessel is moderate in size.  Right Posterior Descending Artery Vessel is moderate in size. Vessel is angiographically normal. The vessel is tortuous.  Inferior Septal Vessel is small in size.  First Right Posterolateral Branch Vessel is small in size.  Second Right Posterolateral Branch Vessel is small in size.  Intervention  Mid Cx lesion Angioplasty Lesion length: 13 mm. Lesion crossed with guidewire using a WIRE ASAHI PROWATER 180CM. Pre-stent angioplasty was performed using a BALLOON SAPPHIRE 3.0X12. Maximum pressure: 12 atm. Inflation time: 20 sec. A STENT PROMUS PREM MR 3.5X16 drug eluting stent was successfully placed. Minimum lumen area: 3.9 mm.  Stent strut is well apposed. Post-stent angioplasty was performed using a BALLOON Aldine EUPHORA RX3.5X12. Maximum pressure: 16 atm. Inflation time: 20 sec. The pre-interventional distal flow is normal (TIMI 3).  The post-interventional distal flow is normal (TIMI 3). The intervention was successful . No complications occurred at this lesion. CATH VISTA GUIDE 6FR XBLAD3.5 (Yes) There is a 0% residual stenosis post intervention.   STRESS TESTS  MYOCARDIAL PERFUSION IMAGING 11/22/2022  Narrative   The study is normal. The study is low risk.   No ST deviation was noted.   LV perfusion is normal. There is evidence of ischemia. There is evidence of infarction.   Left ventricular function is normal. End diastolic cavity size is normal. End  systolic cavity size is normal.   Prior study not available for comparison.  Normal resting and stress perfusion. No ischemia or infarction EF 63%   ECHOCARDIOGRAM  ECHOCARDIOGRAM COMPLETE 04/26/2022  Narrative ECHOCARDIOGRAM REPORT    Patient Name:   Charles Villa Date of Exam: 04/26/2022 Medical Rec #:  [id number]        Height:       71.0 in Accession #:    [id number]       Weight:       227.2 lb Date of Birth:  10/15/1945        BSA:          2.226 m Patient Age:    76 years         BP:           106/66 mmHg Patient Gender: M                HR:           58 bpm. Exam Location:  [address]  Procedure: 2D Echo, Cardiac Doppler, Color Doppler and Intracardiac Opacification Agent  Indications:    r06.09 Dyspnea  History:        Patient has prior history of Echocardiogram examinations, most recent 02/18/2017. CAD; Risk Factors:Hypertension and Dyslipidemia.  Sonographer:    Daphine Deutscher RDCS Referring Phys: [id number] ELIZABETH PECK  IMPRESSIONS   1. Left ventricular ejection fraction, by estimation, is 60 to 65%. The left ventricle has normal function. The left ventricle has no regional wall motion abnormalities. Left ventricular diastolic parameters are consistent with Grade I diastolic dysfunction (impaired relaxation). 2. Right ventricular systolic function is normal. The right ventricular size is normal. Tricuspid regurgitation signal is inadequate for assessing PA pressure. 3. The mitral valve is normal in structure. No evidence of mitral valve regurgitation. No evidence of mitral stenosis. 4. The aortic valve is grossly normal. Aortic valve regurgitation is not visualized. No aortic stenosis is present. 5. The inferior vena cava is normal in size with greater than 50% respiratory variability, suggesting right atrial pressure of 3 mmHg.  Conclusion(s)/Recommendation(s): Normal biventricular function without evidence of hemodynamically significant valvular  heart disease.  FINDINGS Left Ventricle: Left ventricular ejection fraction, by estimation, is 60 to 65%. The left ventricle has normal function. The left ventricle has no regional wall motion abnormalities. Definity contrast agent was given IV to delineate the left ventricular endocardial borders. The left ventricular internal cavity size was normal in size. There is no left ventricular hypertrophy. Left ventricular diastolic parameters are consistent with Grade I diastolic dysfunction (impaired relaxation).  Right Ventricle: The right ventricular size is normal. No increase in right ventricular wall thickness. Right ventricular systolic function is normal. Tricuspid regurgitation signal is inadequate for assessing PA  pressure.  Left Atrium: Left atrial size was normal in size.  Right Atrium: Right atrial size was normal in size.  Pericardium: There is no evidence of pericardial effusion.  Mitral Valve: The mitral valve is normal in structure. No evidence of mitral valve regurgitation. No evidence of mitral valve stenosis.  Tricuspid Valve: The tricuspid valve is normal in structure. Tricuspid valve regurgitation is trivial. No evidence of tricuspid stenosis.  Aortic Valve: The aortic valve is grossly normal. Aortic valve regurgitation is not visualized. No aortic stenosis is present.  Pulmonic Valve: The pulmonic valve was normal in structure. Pulmonic valve regurgitation is trivial. No evidence of pulmonic stenosis.  Aorta: The aortic root is normal in size and structure.  Venous: The inferior vena cava is normal in size with greater than 50% respiratory variability, suggesting right atrial pressure of 3 mmHg.  IAS/Shunts: No atrial level shunt detected by color flow Doppler.   LEFT VENTRICLE PLAX 2D LVIDd:         5.40 cm   Diastology LVIDs:         3.70 cm   LV e' medial:    6.31 cm/s LV PW:         0.60 cm   LV E/e' medial:  10.9 LV IVS:        0.60 cm   LV e' lateral:   8.16  cm/s LVOT diam:     1.90 cm   LV E/e' lateral: 8.4 LV SV:         59 LV SV Index:   26 LVOT Area:     2.84 cm   RIGHT VENTRICLE RV Basal diam:  4.30 cm RV S prime:     12.25 cm/s TAPSE (M-mode): 2.1 cm  LEFT ATRIUM             Index        RIGHT ATRIUM           Index LA diam:        4.20 cm 1.89 cm/m   RA Area:     11.90 cm LA Vol (A2C):   38.2 ml 17.16 ml/m  RA Volume:   29.20 ml  13.12 ml/m LA Vol (A4C):   41.4 ml 18.60 ml/m LA Biplane Vol: 42.1 ml 18.91 ml/m AORTIC VALVE LVOT Vmax:   91.25 cm/s LVOT Vmean:  59.050 cm/s LVOT VTI:    0.207 m  AORTA Ao Root diam: 3.40 cm Ao Asc diam:  3.60 cm  MITRAL VALVE MV Area (PHT): 2.69 cm    SHUNTS MV Decel Time: 282 msec    Systemic VTI:  0.21 m MV E velocity: 68.70 cm/s  Systemic Diam: 1.90 cm MV A velocity: 93.80 cm/s MV E/A ratio:  0.73  Weston Brass MD Electronically signed by Weston Brass MD Signature Date/Time: 04/27/2022/4:45:48 PM    Final             Risk Assessment/Calculations:    CHA2DS2-VASc Score = 4   This indicates a 4.8% annual risk of stroke. The patient's score is based upon: CHF History: 0 HTN History: 1 Diabetes History: 0 Stroke History: 0 Vascular Disease History: 1 Age Score: 2 Gender Score: 0            Physical Exam:   VS:  BP 116/72 (BP Location: Right Arm, Patient Position: Sitting, Cuff Size: Normal)   Pulse 75   Ht 5\' 11"  (1.803 m)   Wt 223 lb (101.2 kg)   SpO2 93%  BMI 31.10 kg/m    Wt Readings from Last 3 Encounters:  06/10/23 223 lb (101.2 kg)  04/15/23 224 lb (101.6 kg)  02/26/23 220 lb 6.4 oz (100 kg)    GEN: Well nourished, well developed in no acute distress NECK: No JVD; No carotid bruits CARDIAC: RRR, no murmurs, rubs, gallops RESPIRATORY:  Clear to auscultation without rales, wheezing or rhonchi  ABDOMEN: Soft, non-tender, non-distended EXTREMITIES:  No edema; No deformity   ASSESSMENT AND PLAN: .    Atrial Fibrillation/Flutter Recurrent  episodes with minimal cardiac awareness. Discussed the risks of stroke and bleeding with anticoagulation. Alcohol intake identified as a potential trigger. -Start Eliquis 5mg  twice daily for stroke prevention. -Order a Zio XT monitor to quantify the percentage of time in atrial fibrillation/flutter. -Advise patient to reduce alcohol intake.  Chronic Dyspnea Consistent shortness of breath with walking, despite normal echocardiogram and stress test results. -Referred to pulmonology for a full pulmonary workup.  If pulmonary evaluation is unremarkable, may consider cardiopulmonary stress test in the future.  CAD: Recent Myoview earlier this year showed no significant ischemia.  Patient denies any chest pain.  Will discontinue aspirin given the need for Eliquis.  Hypertension Blood pressure borderline low, currently on half a tablet of Ziac. -Discontinue Ziac due to borderline low blood pressure and lack of need for rate control in atrial fibrillation/flutter.  Hyperlipidemia Currently on Vascepa and fenofibrate. -Continue current regimen. -Refill fenofibrate prescription.  Inferior Abdominal Aortic Aneurysm Measuring 3.5cm on CT from May 2024. -Plan for follow-up study in 2 years.         Dispo: Follow-up in 6 weeks after heart monitor  Signed, Azalee Course, PA

## 2023-06-10 NOTE — Patient Instructions (Signed)
Medication Instructions:  START ELIQUIS 5 MG TWICE A DAY.  *If you need a refill on your cardiac medications before your next appointment, please call your pharmacy*   Lab Work: CBC IN 2-4 WEEKS  If you have labs (blood work) drawn today and your tests are completely normal, you will receive your results only by: MyChart Message (if you have MyChart) OR A paper copy in the mail If you have any lab test that is abnormal or we need to change your treatment, we will call you to review the results.   Testing/Procedures:  Christena Deem- Long Term Monitor Instructions  Your physician has requested you wear a ZIO patch monitor for 14 days.  This is a single patch monitor. [employer] supplies one patch monitor per enrollment. Additional stickers are not available. Please do not apply patch if you will be having a Nuclear Stress Test,  Echocardiogram, Cardiac CT, MRI, or Chest Xray during the period you would be wearing the  monitor. The patch cannot be worn during these tests. You cannot remove and re-apply the  ZIO XT patch monitor.  Your ZIO patch monitor will be mailed 3 day USPS to your address on file. It may take 3-5 days  to receive your monitor after you have been enrolled.  Once you have received your monitor, please review the enclosed instructions. Your monitor  has already been registered assigning a specific monitor serial # to you.  Billing and Patient Assistance Program Information  We have supplied [employer] with any of your insurance information on file for billing purposes. [employer] offers a sliding scale Patient Assistance Program for patients that do not have  insurance, or whose insurance does not completely cover the cost of the ZIO monitor.  You must apply for the Patient Assistance Program to qualify for this discounted rate.  To apply, please call [employer] at [phone], select option 4, select option 2, ask to apply for  Patient Assistance Program. Meredeth Ide will ask your  household income, and how many people  are in your household. They will quote your out-of-pocket cost based on that information.  [employer] will also be able to set up a 23-month, interest-free payment plan if needed.  Applying the monitor   Shave hair from upper left chest.  Hold abrader disc by orange tab. Rub abrader in 40 strokes over the upper left chest as  indicated in your monitor instructions.  Clean area with 4 enclosed alcohol pads. Let dry.  Apply patch as indicated in monitor instructions. Patch will be placed under collarbone on left  side of chest with arrow pointing upward.  Rub patch adhesive wings for 2 minutes. Remove white label marked "1". Remove the white  label marked "2". Rub patch adhesive wings for 2 additional minutes.  While looking in a mirror, press and release button in center of patch. A small green light will  flash 3-4 times. This will be your only indicator that the monitor has been turned on.  Do not shower for the first 24 hours. You may shower after the first 24 hours.  Press the button if you feel a symptom. You will hear a small click. Record Date, Time and  Symptom in the Patient Logbook.  When you are ready to remove the patch, follow instructions on the last 2 pages of Patient  Logbook. Stick patch monitor onto the last page of Patient Logbook.  Place Patient Logbook in the blue and white box. Use locking tab on box and  tape box closed  securely. The blue and white box has prepaid postage on it. Please place it in the mailbox as  soon as possible. Your physician should have your test results approximately 7 days after the  monitor has been mailed back to [facility].  Call [facility] Customer Care at [phone] if you have questions regarding  your ZIO XT patch monitor. Call them immediately if you see an orange light blinking on your  monitor.  If your monitor falls off in less than 4 days, contact our Monitor department at [phone].   If your monitor becomes loose or falls off after 4 days call [employer] at [phone] for  suggestions on securing your monitor    Follow-Up: At [facility], you and your health needs are our priority.  As part of our continuing mission to provide you with exceptional heart care, we have created designated Provider Care Teams.  These Care Teams include your primary Cardiologist (physician) and Advanced Practice Providers (APPs -  Physician Assistants and Nurse Practitioners) who all work together to provide you with the care you need, when you need it.    Your next appointment:   6 week(s)  Provider:   Fannie Knee

## 2023-06-11 ENCOUNTER — Telehealth: Payer: Self-pay

## 2023-06-11 NOTE — Telephone Encounter (Signed)
   Pre-operative Risk Assessment    Patient Name: Charles Villa  DOB: 08/27/1945 MRN: [id number]  Last office visit : 04/15/2023 Upcoming appointment:  07/29/2023 with Azalee Course, PA     Request for Surgical Clearance    Procedure:   Cataract extraction w/Intraocular Lens Implantationof the LEFT EYE, followed by the RIGHT EYE.   Date of Surgery:  Clearance 06/30/23                                 Surgeon:  Dr. Mia Creek Surgeon's Group or Practice Name:  [facility] Phone number:  [phone] Fax number:  [phone]   Type of Clearance Requested:   - Medical  - Pharmacy:  Hold Apixaban (Eliquis) Patient does NOT need to stop any medication.    Type of Anesthesia:  Topical anesthesia w/IV medication will be used.    Additional requests/questions:    Elyse Jarvis   06/11/2023, 2:55 PM

## 2023-06-11 NOTE — Telephone Encounter (Signed)
   Patient Name: Charles Villa  DOB: [birth year] MRN: [id number]  Primary Cardiologist: Chrystie Nose, MD  Chart reviewed as part of pre-operative protocol coverage. Cataract extractions are recognized in guidelines as low risk surgeries that do not typically require specific preoperative testing or holding of blood thinner therapy. Therefore, given past medical history and time since last visit, based on ACC/AHA guidelines, DAXYN BERENDT would be at acceptable risk for the planned procedure without further cardiovascular testing.   I will route this recommendation to the requesting party via Epic fax function and remove from pre-op pool.  Please call with questions.  Joni Reining, NP 06/11/2023, 3:29 PM

## 2023-06-11 NOTE — Telephone Encounter (Signed)
Patient seen by Wynema Birch PA on 06/10/23

## 2023-06-13 ENCOUNTER — Telehealth: Payer: Self-pay | Admitting: Internal Medicine

## 2023-06-13 NOTE — Telephone Encounter (Signed)
Patient calling in bout the heart monitor, he hasn't received it. He leaves to go out of town on Sunday. Please advise

## 2023-06-13 NOTE — Telephone Encounter (Signed)
Per Katrina with monitor team, monitor slated for delivery for 11:15 - 3:15pm today. Patient notified

## 2023-06-14 DIAGNOSIS — I4892 Unspecified atrial flutter: Secondary | ICD-10-CM

## 2023-06-30 DIAGNOSIS — H2512 Age-related nuclear cataract, left eye: Secondary | ICD-10-CM | POA: Diagnosis not present

## 2023-07-01 ENCOUNTER — Telehealth: Payer: Self-pay | Admitting: Internal Medicine

## 2023-07-01 DIAGNOSIS — H2511 Age-related nuclear cataract, right eye: Secondary | ICD-10-CM | POA: Diagnosis not present

## 2023-07-01 NOTE — Telephone Encounter (Signed)
Calling to have labs release to [facility] [address]. Please advise

## 2023-07-01 NOTE — Telephone Encounter (Signed)
Spoke to patient and advised lab order is the system. He can go to [facility] for labs.

## 2023-07-02 DIAGNOSIS — Z79899 Other long term (current) drug therapy: Secondary | ICD-10-CM | POA: Diagnosis not present

## 2023-07-03 LAB — CBC
Hematocrit: 46.9 % (ref 37.5–51.0)
Hemoglobin: 16.2 g/dL (ref 13.0–17.7)
MCH: 36.3 pg — ABNORMAL HIGH (ref 26.6–33.0)
MCHC: 34.5 g/dL (ref 31.5–35.7)
MCV: 105 fL — ABNORMAL HIGH (ref 79–97)
Platelets: 154 10*3/uL (ref 150–450)
RBC: 4.46 x10E6/uL (ref 4.14–5.80)
RDW: 11.8 % (ref 11.6–15.4)
WBC: 5.6 10*3/uL (ref 3.4–10.8)

## 2023-07-05 ENCOUNTER — Telehealth: Payer: Self-pay | Admitting: Cardiology

## 2023-07-05 DIAGNOSIS — I4892 Unspecified atrial flutter: Secondary | ICD-10-CM | POA: Diagnosis not present

## 2023-07-05 NOTE — Telephone Encounter (Signed)
Notified by [employer] that patient had an abnormal zio patch- reported that on 11/30 at 2:24 PM, patient had an episode of afib with RVR, HR 204 BPM. Episode lasted 60 seconds.   Monitor report available on epic. Has not yet formally been resulted. I will reach out to ordering provider as an FYI. Patient does have an appointment with Dr. Rennis Golden on 12/9  Jonita Albee, PA-C 07/05/2023 2:20 PM

## 2023-07-07 ENCOUNTER — Ambulatory Visit: Payer: Medicare HMO | Attending: Internal Medicine | Admitting: Internal Medicine

## 2023-07-07 ENCOUNTER — Encounter: Payer: Self-pay | Admitting: Internal Medicine

## 2023-07-07 VITALS — BP 102/52 | HR 97 | Ht 71.0 in | Wt 218.0 lb

## 2023-07-07 DIAGNOSIS — R0609 Other forms of dyspnea: Secondary | ICD-10-CM | POA: Diagnosis not present

## 2023-07-07 DIAGNOSIS — I25118 Atherosclerotic heart disease of native coronary artery with other forms of angina pectoris: Secondary | ICD-10-CM

## 2023-07-07 DIAGNOSIS — I480 Paroxysmal atrial fibrillation: Secondary | ICD-10-CM

## 2023-07-07 DIAGNOSIS — F1010 Alcohol abuse, uncomplicated: Secondary | ICD-10-CM

## 2023-07-07 NOTE — Progress Notes (Signed)
OFFICE CONSULT NOTE  Chief Complaint:  Routine follow-up  Primary Care Physician: Shelva Majestic, MD  HPI:  Charles Villa is a 77 y.o. male who is being seen today for the evaluation of shortness of breath at the request of Shelva Majestic, MD. Mr. Drotar has a history of hypertension, dyslipidemia, GERD, chronic back pain, OCD and long-standing alcohol use, according to his wife who did most the talking during the visit he drinks about 2-3 drinks per day however could be more. She says that's increased more recently. She notes that over the past 2 years she's had progressively worsening shortness of breath. 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 They have a vacation house at [facility] and he has approximate 65 steps to climb up. He says he gets short of breath walking up the stairs and has been more difficult to do that without stopping. He seems to minimize his symptoms. His wife is concerned about possible coronary artery disease as a cause of this. She used to work in cardiac rehabilitation, possibly is an Theatre stage manager. There is a family history of hypertension and stroke in his mother and his father who was a smoker died of lung cancer. He denies any chest pain or pressure, palpitations, presyncope or syncopal symptoms. He also denies any fever, chills, cough or other infective symptoms.  02/20/2017  Charles Villa returns today for follow-up. He underwent echocardiogram as well as an exercise treadmill stress test. The exercise treadmill stress test unfortunately was abnormal indicating ST segment depression in leads 23 aVF and V5 and V6 beginning at 5 minutes a stress returning to baseline after about 5-9 minutes of recovery. His echocardiogram demonstrated normal systolic function, mild diastolic dysfunction and some mild valvular abnormalities. I reviewed the results for him today along with his wife and my recommendations for  left heart catheterization.  03/17/2017  Charles Villa was seen today in follow-up. He underwent a graded exercise tolerance test which was abnormal indicating ST segment changes concerning for ischemia. He was subsequently referred for cardiac catheterization. That was performed on 02/24/2017 by Dr. Herbie Baltimore which indicated a 90% mid circumflex lesion. This was stented with a Promus Premier 3.5 x 16 mm drug-eluting stent. It was noted that there was moderate disease in the ostial and mid LAD.[name] LVEF was 55-65% with normal LV and diastolic pressure. After the procedure he reports an improvement in his breathing however he does not feel that he is back to "baseline". I explained him that there may be certainly other factors causing him to be short of breath including his significant weight gain and abdominal obesity. We also discussed the fact that he is on dual antiplatelet therapy and that continued alcohol use is not advisable. Fortunately he is agreed to cardiac rehabilitation. I feel that will help with both weight management and cardiac vascular conditioning.  09/09/2017  Charles Villa was seen today in follow-up.  He continues to do well.  He completed cardiac rehabilitation.  He denies any chest pain or worsening shortness of breath.  He does feel some fatigue and blood pressure was noted to be low today 94/62.  He said that is unusual as typically his blood pressures around 100 220 systolic.  EKG shows sinus rhythm with no ischemia today.  He is on dual antiplatelet therapy with aspirin and Plavix which we will plan to continue for 6 months.  03/25/2018  Charles Villa returns today for follow-up.  Overall  he reports feeling well and is generally asymptomatic.  He says he is able to walk up and down the hill to his [place] house at [facility] now without much difficulty.  He mentioned that he has been able to cut back on his alcohol somewhat but has not quit completely.  He is tolerating his heart  failure medicines.  Blood pressure is excellent today 110/70.  He is now a year out from stenting to the mid circumflex artery.  He continues on aspirin and Plavix.  09/22/2018  Charles Villa is seen today in follow-up.  Recently says that his alcohol intake is gone up a little more.  He is now about 3 drinks a day.  He denies any worsening shortness of breath.  His most recent lipid profile showed a total cholesterol 173, triglycerides 305, HDL 70 and LDL direct of 76.  This is on 40 of rosuvastatin.  We discussed recent trial data from REDUCE-IT study indicating that Vascepa (icosapent ethyl) showed significant cardiovascular risk reduction in patients elevated triglycerides persistently at goal LDL between 150 and 500.  I think he is an ideal candidate for this medication.  Of course alcohol is also likely contributing to his elevated triglycerides as well.  03/30/2020  Charles Villa Is seen today in follow-up.  Overall he feels well.  His blood pressure was a little low today but he says he is asymptomatic with that.  He has not been taking the Vascepa 4 g daily rather only 1 g daily because of the cost he wanted to spread out the medicine.  He also noted recently that his triglycerides were lower and not feel that he needed the full 4 g dose.  He and his [place] and he has been less active.  He occasionally feels a little run down and wonders if this could be coronary disease or whether additional stress testing is necessary.  EKG shows no ischemic changes rather a sinus bradycardia at 58.  He reports continued alcohol use not worse or significantly less than previous.  10/29/2020  Charles Villa returns today for follow-up.  In general he seems to be doing pretty well.  He significantly cut back alcohol use.  He is trying to eat healthier.  He told me unfortunately his wife had an acute MI and received a stent.  She is still now recovering and they are working to try to change  their diets together.  He did have recent repeat lipids however which showed further improvement in his numbers.  Total cholesterol is now 164, triglycerides have normalized at 121, HDL is 65 and LDL 74.  He is very near target across the board for his lipids.  He does report he gets some occasional chest discomfort which is short-lived and not necessarily with exertion or relieved by rest.  He asked for [place] to repeat an EKG today which I did which showed normal sinus rhythm and incomplete right bundle branch block.  No ischemic changes  07/07/2023  Charles Villa is seen today in follow-up.  He has had some progressive dyspnea.  Over the past year he has been seen a few times by our APP's.  In April he underwent nuclear stress testing which was negative for ischemia.  More recently he was noted to have atrial fibrillation.  He was started on Eliquis.  He was advised against drinking alcohol.  He was ordered for 2-week ZIO monitor.  I personally reviewed this which showed  episodes of SVT and atrial fibrillation with a burden of about 20%.  He is unaware of his A-fib.  In general his rate control appears to be good but he is on no rate lowering medications.  He has struggled with a low blood pressure.  PMHx:  Past Medical History:  Diagnosis Date   Adenomatous colon polyp 11/02/2019   Anal fissure 08/17/2012   Really more gluteal crease irritation- protosol hc 2.5% prn    BACK PAIN, CHRONIC 02/10/2007   CAD (coronary artery disease), native coronary artery    02/24/17-PCI/DES to mLcx, normal EF.   Diverticulosis of colon    2007   GERD 02/23/2007   GERD (gastroesophageal reflux disease)    Hyperlipidemia    Hypertension    OCD (obsessive compulsive disorder) 01/23/2016   Ocd/hoarding issues- psychiatristis- [place] started on zoloft 100mg --> paxil   Personal history of colonic polyps 03/14/2014   08/2005. Benign. 10 year repeat. 2010 colonoscopy in record was entered erroneously.     PSORIASIS 02/10/2007    Topical therapy.      Scalp psoriasis    SQUAMOUS CELL CARCINOMA OF SKIN SITE UNSPECIFIED 08/24/2010   Annotation: face     Past Surgical History:  Procedure Laterality Date   COLONOSCOPY     COLONOSCOPY W/ POLYPECTOMY     2007 Dr Arlyce Dice   CORONARY STENT INTERVENTION N/A 02/24/2017   Procedure: Coronary Stent Intervention;  Surgeon: Marykay Lex, MD;  Location: [facility] INVASIVE CV LAB;  Service: Cardiovascular;  Laterality: N/A;   LEFT HEART CATH AND CORONARY ANGIOGRAPHY N/A 02/24/2017   Procedure: Left Heart Cath and Coronary Angiography;  Surgeon: Marykay Lex, MD;  Location: [facility] INVASIVE CV LAB;  Service: Cardiovascular;  Laterality: N/A;   POLYPECTOMY     SKIN CANCER EXCISION     squamous cell CA - nose   TONSILLECTOMY      FAMHx:  Family History  Problem Relation Age of Onset   Lung cancer Father    Stroke Mother    Hypertension Mother    Colon polyps Neg Hx    Colon cancer Neg Hx    Esophageal cancer Neg Hx    Rectal cancer Neg Hx    Stomach cancer Neg Hx     SOCHx:   reports that he has never smoked. He has never used smokeless tobacco. He reports current alcohol use of about 21.0 standard drinks of alcohol per week. He reports that he does not use drugs.  ALLERGIES:  No Known Allergies  ROS: Pertinent items noted in HPI and remainder of comprehensive ROS otherwise negative.  HOME MEDS: Current Outpatient Medications on File Prior to Visit  Medication Sig Dispense Refill   apixaban (ELIQUIS) 5 MG TABS tablet Take 1 tablet (5 mg total) by mouth 2 (two) times daily. 60 tablet 11   clobetasol (TEMOVATE) 0.05 % external solution Apply 1 application topically at bedtime as needed (scalp).   5   clobetasol cream (TEMOVATE) 0.05 % Apply 1 application topically 2 (two) times daily as needed (abdomen).   3   clotrimazole-betamethasone (LOTRISONE) cream Apply 1 application topically 2 (two) times daily as needed. psoriass     desonide (DESONATE) 0.05 % gel Apply 1  application  topically 2 (two) times daily as needed. Skin lesions     fenofibrate 160 MG tablet Take 1 tablet (160 mg total) by mouth daily. 90 tablet 3   fluocinolone (VANOS) 0.01 % cream Apply 1 application topically daily as needed.  gatifloxacin (ZYMAXID) 0.5 % SOLN Place 1 drop into the left eye 4 (four) times daily.     hydrocortisone (ANUSOL-HC) 2.5 % rectal cream Place 1 application rectally 2 (two) times daily as needed for hemorrhoids or anal itching.     icosapent Ethyl (VASCEPA) 1 g capsule TAKE 2 CAPSULES BY MOUTH TWO TIMES A DAY. 120 capsule 5   ketorolac (ACULAR) 0.5 % ophthalmic solution Place 1 drop into the left eye 4 (four) times daily.     pantoprazole (PROTONIX) 40 MG tablet TAKE 1 TABLET BY MOUTH EVERY DAY 90 tablet 1   prednisoLONE acetate (PRED FORTE) 1 % ophthalmic suspension Place 1 drop into the left eye.     rosuvastatin (CRESTOR) 40 MG tablet TAKE 1 TABLET BY MOUTH EVERY DAY 90 tablet 1   silver sulfADIAZINE (SILVADENE) 1 % cream Apply 1 application topically 2 (two) times daily as needed (groin).   5   No current facility-administered medications on file prior to visit.    LABS/IMAGING: No results found for this or any previous visit (from the past 48 hour(s)). No results found.  LIPID PANEL:    Component Value Date/Time   CHOL 165 08/15/2021 1211   TRIG 177.0 (H) 08/15/2021 1211   TRIG 89 08/11/2006 0818   HDL 58.20 08/15/2021 1211   CHOLHDL 3 08/15/2021 1211   VLDL 35.4 08/15/2021 1211   LDLCALC 71 08/15/2021 1211   LDLDIRECT 60.0 02/18/2022 1410    WEIGHTS: Wt Readings from Last 3 Encounters:  07/07/23 218 lb (98.9 kg)  06/10/23 223 lb (101.2 kg)  04/15/23 224 lb (101.6 kg)    VITALS: BP (!) 102/52   Pulse 97   Ht 5\' 11"  (1.803 m)   Wt 218 lb (98.9 kg)   SpO2 96%   BMI 30.40 kg/m   EXAM: General appearance: alert and no distress Neck: no carotid bruit, no JVD and thyroid not enlarged, symmetric, no tenderness/mass/nodules Lungs:  clear to auscultation bilaterally Heart: regular rate and rhythm, S1, S2 normal, no murmur, click, rub or gallop Abdomen: soft, non-tender; bowel sounds normal; no masses,  no organomegaly and obese Extremities: extremities normal, atraumatic, no cyanosis or edema Pulses: 2+ and symmetric Skin: Skin color, texture, turgor normal. No rashes or lesions Neurologic: Grossly normal Psych: Pleasant  EKG: Deferred  ASSESSMENT: Coronary artery disease status post PCI to the mid circumflex-Promus Premier 3.5 by 16mm DES (01/2017) Paroxysmal atrial fibrillation-anticoagulated on Eliquis Dyspnea with exertion, negative Myoview stress test (10/2022) Alcohol abuse Hypertension Dyslipidemia Obesity/Metabolic syndrome  PLAN: 1.   Mr. Brusso has had some progressive dyspnea on exertion but had a negative Myoview earlier this year.  He is having some paroxysmal atrial fibrillation and is now appropriate anticoagulated on Eliquis.  He understands that alcohol and Eliquis are not a good combination and is working to try to reduce that.  It is felt that his shortness of breath may be more pulmonary and he is scheduled to see Dr. Francine Graven later this week.  His rate control is generally good with regards to A-fib on no AV nodal blocking medications due to hypotension.  Plan follow-up with [place] in 6 months or sooner as necessary.  Chrystie Nose, MD, [facility], FACP  [place]  [facility]  Medical Director of the Advanced Lipid Disorders &  Cardiovascular Risk Reduction Clinic Diplomate of the American Board of Clinical Lipidology Attending Cardiologist  Direct Dial: [phone]  Fax: [phone]  Website:  [email or web address] 07/07/2023, 9:27  AM

## 2023-07-07 NOTE — Patient Instructions (Signed)
Medication Instructions:  NO CHANGES *If you need a refill on your cardiac medications before your next appointment, please call your pharmacy*   Follow-Up: At [facility], you and your health needs are our priority.  As part of our continuing mission to provide you with exceptional heart care, we have created designated Provider Care Teams.  These Care Teams include your primary Cardiologist (physician) and Advanced Practice Providers (APPs -  Physician Assistants and Nurse Practitioners) who all work together to provide you with the care you need, when you need it.  We recommend signing up for the patient portal called "MyChart".  Sign up information is provided on this After Visit Summary.  MyChart is used to connect with patients for Virtual Visits (Telemedicine).  Patients are able to view lab/test results, encounter notes, upcoming appointments, etc.  Non-urgent messages can be sent to your provider as well.   To learn more about what you can do with MyChart, go to [email or web address].    Your next appointment:   6 months with Azalee Course, PA ** call in Feb or March for appointment

## 2023-07-08 ENCOUNTER — Encounter: Payer: Medicare HMO | Admitting: Physical Therapy

## 2023-07-08 ENCOUNTER — Other Ambulatory Visit: Payer: Self-pay | Admitting: Family Medicine

## 2023-07-10 ENCOUNTER — Encounter: Payer: Self-pay | Admitting: Pulmonary Disease

## 2023-07-10 ENCOUNTER — Ambulatory Visit: Payer: Medicare HMO | Admitting: Pulmonary Disease

## 2023-07-10 VITALS — BP 108/62 | HR 76 | Ht 71.0 in | Wt 220.0 lb

## 2023-07-10 DIAGNOSIS — R0602 Shortness of breath: Secondary | ICD-10-CM

## 2023-07-10 MED ORDER — ALBUTEROL SULFATE HFA 108 (90 BASE) MCG/ACT IN AERS
2.0000 | INHALATION_SPRAY | Freq: Four times a day (QID) | RESPIRATORY_TRACT | 6 refills | Status: AC | PRN
Start: 2023-07-10 — End: ?

## 2023-07-10 NOTE — Patient Instructions (Signed)
We will check a CT Chest scan and pulmonary function tests for your shortness of breath  Use albuterol inhaler 2 puffs every 4-6 hours as needed for shortness of breath  Follow up in 2 months, call sooner if needed.

## 2023-07-10 NOTE — Progress Notes (Signed)
Synopsis: Referred in December for shortness of breath  Subjective:   PATIENT ID: Charles Villa, Charles Villa   HPI  Chief Complaint  Patient presents with   Pulmonary Consult    Referred by Dr. Tana Conch. Pt c/o DOE over the past 2 years, gradually getting worse. He used to be able to push mow his entire yard-now has to stop and rest. He has prod cough with white sputum in the am's.    Charles Villa is a 77 year old male, never smoker with GERD, chronic back pain, atrial fibrillation and hypertension who is referred to pulmonary clinic for shortness of breath.   He presents with shortness of breath on exertion, worsening over the past couple of years. They notice it particularly when hiking or pushing a lawnmower, and even walking up their inclined driveway leaves them 'huffing and puffing.' They deny any associated wheezing or cough, and it does not disturb their sleep. They do report some sinus drainage and occasional 'gobs of slime' in their throat upon waking. They deny any seasonal variation or known allergies. They have never smoked, but were exposed to secondhand smoke growing up and during their early working years. They also report intermittent dust exposure from their previous job working on Engineer, structural. They are concerned about indoor air quality at home, as both they and their wife experience morning cough, runny nose, and sneezing, and they notice a significant amount of dust despite regular furnace filter changes. They retired four years ago and admit to a decrease in activity level since then. His father had lung cancer and died at age 29.  Past Medical History:  Diagnosis Date   Adenomatous colon polyp 11/02/2019   Anal fissure 08/17/2012   Really more gluteal crease irritation- protosol hc 2.5% prn    BACK PAIN, CHRONIC 02/10/2007   CAD (coronary artery disease), native coronary artery    02/24/17-PCI/DES to mLcx,  normal EF.   Diverticulosis of colon    2007   GERD 02/23/2007   GERD (gastroesophageal reflux disease)    Hyperlipidemia    Hypertension    OCD (obsessive compulsive disorder) 01/23/2016   Ocd/hoarding issues- psychiatristis- [place] started on zoloft 100mg --> paxil   Personal history of colonic polyps 03/14/2014   08/2005. Benign. 10 year repeat. 2010 colonoscopy in record was entered erroneously.     PSORIASIS 02/10/2007   Topical therapy.      Scalp psoriasis    SQUAMOUS CELL CARCINOMA OF SKIN SITE UNSPECIFIED 08/24/2010   Annotation: face      Family History  Problem Relation Age of Onset   Stroke Mother    Hypertension Mother    Lung cancer Father        heavy smoker   Colon polyps Neg Hx    Colon cancer Neg Hx    Esophageal cancer Neg Hx    Rectal cancer Neg Hx    Stomach cancer Neg Hx      Social History   Socioeconomic History   Marital status: Married    Spouse name: Not on file   Number of children: 2   Years of education: Not on file   Highest education level: Not on file  Occupational History   Occupation: Solicitor: UNEMPLOYED   Occupation: retired  Tobacco Use   Smoking status: Never    Passive exposure: Past   Smokeless tobacco: Never  Building services engineer  status: Never Used  Substance and Sexual Activity   Alcohol use: Yes    Alcohol/week: 21.0 standard drinks of alcohol    Types: 21 Standard drinks or equivalent per week    Comment: 2-3 per day (gin)   Drug use: No   Sexual activity: Not on file  Other Topics Concern   Not on file  Social History Narrative   Married 42 years in 2015. 2 kids, 1 grandchild that is 27.60 years old.       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      Hobbies: ride bike, ski in winter    Social Drivers of Health   Financial Resource Strain: Low Risk  (06/18/2022)   Overall Financial Resource Strain (CARDIA)    Difficulty of Paying Living Expenses: Not hard at all  Food  Insecurity: No Food Insecurity (06/18/2022)   Hunger Vital Sign    Worried About Running Out of Food in the Last Year: Never true    Ran Out of Food in the Last Year: Never true  Transportation Needs: No Transportation Needs (06/18/2022)   PRAPARE - Administrator, Civil Service (Medical): No    Lack of Transportation (Non-Medical): No  Physical Activity: Inactive (06/18/2022)   Exercise Vital Sign    Days of Exercise per Week: 0 days    Minutes of Exercise per Session: 0 min  Stress: Stress Concern Present (06/18/2022)   [employer] of Occupational Health - Occupational Stress Questionnaire    Feeling of Stress : To some extent  Social Connections: Moderately Integrated (06/18/2022)   Social Connection and Isolation Panel [NHANES]    Frequency of Communication with Friends and Family: Once a week    Frequency of Social Gatherings with Friends and Family: More than three times a week    Attends Religious Services: More than 4 times per year    Active Member of [employer] or Organizations: No    Attends Banker Meetings: Never    Marital Status: Married  Catering manager Violence: Not At Risk (06/18/2022)   Humiliation, Afraid, Rape, and Kick questionnaire    Fear of Current or Ex-Partner: No    Emotionally Abused: No    Physically Abused: No    Sexually Abused: No     No Known Allergies   Outpatient Medications Prior to Visit  Medication Sig Dispense Refill   apixaban (ELIQUIS) 5 MG TABS tablet Take 1 tablet (5 mg total) by mouth 2 (two) times daily. 60 tablet 11   clobetasol (TEMOVATE) 0.05 % external solution Apply 1 application topically at bedtime as needed (scalp).   5   clobetasol cream (TEMOVATE) 0.05 % Apply 1 application topically 2 (two) times daily as needed (abdomen).   3   clotrimazole-betamethasone (LOTRISONE) cream Apply 1 application topically 2 (two) times daily as needed. psoriass     desonide (DESONATE) 0.05 % gel Apply 1 application   topically 2 (two) times daily as needed. Skin lesions     fenofibrate 160 MG tablet Take 1 tablet (160 mg total) by mouth daily. 90 tablet 3   fluocinolone (VANOS) 0.01 % cream Apply 1 application topically daily as needed.      gatifloxacin (ZYMAXID) 0.5 % SOLN Place 1 drop into the left eye 4 (four) times daily.     hydrocortisone (ANUSOL-HC) 2.5 % rectal cream Place 1 application rectally 2 (two) times daily as needed for hemorrhoids or anal itching.     icosapent  Ethyl (VASCEPA) 1 g capsule TAKE 2 CAPSULES BY MOUTH TWO TIMES A DAY. 120 capsule 5   ketorolac (ACULAR) 0.5 % ophthalmic solution Place 1 drop into the left eye 4 (four) times daily.     pantoprazole (PROTONIX) 40 MG tablet TAKE 1 TABLET BY MOUTH EVERY DAY 90 tablet 1   prednisoLONE acetate (PRED FORTE) 1 % ophthalmic suspension Place 1 drop into the left eye.     rosuvastatin (CRESTOR) 40 MG tablet TAKE 1 TABLET BY MOUTH EVERY DAY 90 tablet 1   silver sulfADIAZINE (SILVADENE) 1 % cream Apply 1 application topically 2 (two) times daily as needed (groin).   5   No facility-administered medications prior to visit.    Review of Systems  Constitutional:  Negative for chills, fever, malaise/fatigue and weight loss.  HENT:  Negative for congestion, sinus pain and sore throat.   Eyes: Negative.   Respiratory:  Positive for shortness of breath. Negative for cough, hemoptysis, sputum production and wheezing.   Cardiovascular:  Negative for chest pain, palpitations, orthopnea, claudication and leg swelling.  Gastrointestinal:  Negative for abdominal pain, heartburn, nausea and vomiting.  Genitourinary: Negative.   Musculoskeletal:  Negative for joint pain and myalgias.  Skin:  Negative for rash.  Neurological:  Negative for weakness.  Endo/Heme/Allergies: Negative.   Psychiatric/Behavioral: Negative.     Objective:   Vitals:   07/10/23 1046  BP: 108/62  Pulse: 76  SpO2: 97%  Weight: 220 lb (99.8 kg)  Height: 5\' 11"  (1.803 m)    Physical Exam Constitutional:      General: He is not in acute distress.    Appearance: Normal appearance.  Eyes:     General: No scleral icterus.    Conjunctiva/sclera: Conjunctivae normal.  Cardiovascular:     Rate and Rhythm: Normal rate and regular rhythm.  Pulmonary:     Breath sounds: No wheezing, rhonchi or rales.  Musculoskeletal:     Right lower leg: No edema.     Left lower leg: No edema.  Skin:    General: Skin is warm and dry.  Neurological:     General: No focal deficit present.   CBC    Component Value Date/Time   WBC 5.6 07/02/2023 1357   WBC 8.4 01/30/2023 2005   RBC 4.46 07/02/2023 1357   RBC 4.30 01/30/2023 2005   HGB 16.2 07/02/2023 1357   HCT 46.9 07/02/2023 1357   PLT 154 07/02/2023 1357   MCV 105 (H) 07/02/2023 1357   MCH 36.3 (H) 07/02/2023 1357   MCH 34.7 (H) 01/30/2023 2005   MCHC 34.5 07/02/2023 1357   MCHC 34.2 01/30/2023 2005   RDW 11.8 07/02/2023 1357   LYMPHSABS 2.1 10/21/2022 1347   MONOABS 0.5 10/21/2022 1347   EOSABS 0.1 10/21/2022 1347   BASOSABS 0.0 10/21/2022 1347      Latest Ref Rng & Units 01/30/2023    8:05 PM 11/15/2022    3:13 PM 10/21/2022    1:47 PM  BMP  Glucose 70 - 99 mg/dL 478  295  621   BUN 8 - 23 mg/dL 14  14  17    Creatinine 0.61 - 1.24 mg/dL 3.08  6.57  8.46   BUN/Creat Ratio 10 - 24  10    Sodium 135 - 145 mmol/L 137  139  138   Potassium 3.5 - 5.1 mmol/L 3.6  4.1  3.7   Chloride 98 - 111 mmol/L 106  103  105   CO2 22 - 32  mmol/L 18  21  22    Calcium 8.9 - 10.3 mg/dL 8.6  9.2  9.2    Chest imaging: CXR 01/30/23 Cardiac shadow is within normal limits. The lungs are well aerated bilaterally. No focal infiltrate or effusion is seen. No bony abnormality is noted.  PFT:     No data to display          Labs:  Path:  Echo:  Heart Catheterization:    Assessment & Plan:   Shortness of breath - Plan: CT CHEST HIGH RESOLUTION, Pulmonary Function Test, albuterol (VENTOLIN HFA) 108 (90 Base) MCG/ACT  inhaler  Discussion: Charles Villa is a 77 year old male, never smoker with GERD, chronic back pain, atrial fibrillation and hypertension who is referred to pulmonary clinic for shortness of breath.  Exertional Dyspnea Progressive over the past 1-2 years, worse with exertion such as mowing the lawn or walking up an incline. No associated wheezing, cough, or nocturnal symptoms. History of secondhand smoke exposure. Cardiology evaluation reportedly unremarkable. -Order CT chest scan. -Schedule pulmonary function tests to assess lung function. -Consider cardiopulmonary exercise test if initial tests are inconclusive. -Prescribe Albuterol inhaler to use prior to exertional activities, demonstrated proper use.  Deconditioning Reduced activity level since retirement 4 years ago, admits to being fairly inert. -Encourage regular physical activity, starting with short walks and gradually increasing to 20-30 minutes of continuous activity.  Gastroesophageal Reflux Disease Controlled with intermittent Protonix use. -Continue current management.  Atrial Fibrillation Diagnosed incidentally prior to cataract surgery, no symptoms reported. -Continue current management under Cardiology.  Follow-up in 2 months after completion of testing.  Melody Comas, MD [facility] Office: [phone]   Current Outpatient Medications:    albuterol (VENTOLIN HFA) 108 (90 Base) MCG/ACT inhaler, Inhale 2 puffs into the lungs every 6 (six) hours as needed for wheezing or shortness of breath., Disp: 8 g, Rfl: 6   apixaban (ELIQUIS) 5 MG TABS tablet, Take 1 tablet (5 mg total) by mouth 2 (two) times daily., Disp: 60 tablet, Rfl: 11   clobetasol (TEMOVATE) 0.05 % external solution, Apply 1 application topically at bedtime as needed (scalp). , Disp: , Rfl: 5   clobetasol cream (TEMOVATE) 0.05 %, Apply 1 application topically 2 (two) times daily as needed (abdomen). , Disp: , Rfl: 3    clotrimazole-betamethasone (LOTRISONE) cream, Apply 1 application topically 2 (two) times daily as needed. psoriass, Disp: , Rfl:    desonide (DESONATE) 0.05 % gel, Apply 1 application  topically 2 (two) times daily as needed. Skin lesions, Disp: , Rfl:    fenofibrate 160 MG tablet, Take 1 tablet (160 mg total) by mouth daily., Disp: 90 tablet, Rfl: 3   fluocinolone (VANOS) 0.01 % cream, Apply 1 application topically daily as needed. , Disp: , Rfl:    gatifloxacin (ZYMAXID) 0.5 % SOLN, Place 1 drop into the left eye 4 (four) times daily., Disp: , Rfl:    hydrocortisone (ANUSOL-HC) 2.5 % rectal cream, Place 1 application rectally 2 (two) times daily as needed for hemorrhoids or anal itching., Disp: , Rfl:    icosapent Ethyl (VASCEPA) 1 g capsule, TAKE 2 CAPSULES BY MOUTH TWO TIMES A DAY., Disp: 120 capsule, Rfl: 5   ketorolac (ACULAR) 0.5 % ophthalmic solution, Place 1 drop into the left eye 4 (four) times daily., Disp: , Rfl:    pantoprazole (PROTONIX) 40 MG tablet, TAKE 1 TABLET BY MOUTH EVERY DAY, Disp: 90 tablet, Rfl: 1   prednisoLONE acetate (PRED FORTE)  1 % ophthalmic suspension, Place 1 drop into the left eye., Disp: , Rfl:    rosuvastatin (CRESTOR) 40 MG tablet, TAKE 1 TABLET BY MOUTH EVERY DAY, Disp: 90 tablet, Rfl: 1   silver sulfADIAZINE (SILVADENE) 1 % cream, Apply 1 application topically 2 (two) times daily as needed (groin). , Disp: , Rfl: 5

## 2023-07-28 DIAGNOSIS — H2511 Age-related nuclear cataract, right eye: Secondary | ICD-10-CM | POA: Diagnosis not present

## 2023-07-29 ENCOUNTER — Ambulatory Visit: Payer: Medicare HMO | Admitting: Physician Assistant

## 2023-08-19 ENCOUNTER — Ambulatory Visit
Admission: RE | Admit: 2023-08-19 | Discharge: 2023-08-19 | Disposition: A | Discharge status: Home / Self Care | Payer: Medicare HMO | Source: Ambulatory Visit | Attending: Pulmonary Disease | Admitting: Pulmonary Disease

## 2023-08-19 DIAGNOSIS — R911 Solitary pulmonary nodule: Secondary | ICD-10-CM | POA: Diagnosis not present

## 2023-08-19 DIAGNOSIS — I2510 Atherosclerotic heart disease of native coronary artery without angina pectoris: Secondary | ICD-10-CM | POA: Diagnosis not present

## 2023-08-19 DIAGNOSIS — R0602 Shortness of breath: Secondary | ICD-10-CM

## 2023-09-01 ENCOUNTER — Other Ambulatory Visit: Payer: Self-pay | Admitting: Family Medicine

## 2023-09-01 ENCOUNTER — Other Ambulatory Visit: Payer: Self-pay | Admitting: Physician Assistant

## 2023-09-03 ENCOUNTER — Ambulatory Visit: Payer: Medicare HMO | Admitting: Pulmonary Disease

## 2023-09-03 ENCOUNTER — Encounter: Payer: Self-pay | Admitting: Pulmonary Disease

## 2023-09-03 VITALS — BP 128/60 | HR 68 | Ht 71.0 in | Wt 226.0 lb

## 2023-09-03 DIAGNOSIS — R0602 Shortness of breath: Secondary | ICD-10-CM

## 2023-09-03 DIAGNOSIS — R911 Solitary pulmonary nodule: Secondary | ICD-10-CM

## 2023-09-03 DIAGNOSIS — R9389 Abnormal findings on diagnostic imaging of other specified body structures: Secondary | ICD-10-CM

## 2023-09-03 NOTE — Progress Notes (Signed)
 Full PFT performed today.

## 2023-09-03 NOTE — Patient Instructions (Addendum)
 Your breathing tests are normal  Your CT Chest scan shows scattered areas of non-specific inflammation, we will repeat a CT Chest scan in the future to monitor these findings.  Continue albuterol  inhaler as needed  We will schedule you for cardiopulmonary exercise test  Follow up in 3 months after cardiopulmonary exercise test

## 2023-09-03 NOTE — Progress Notes (Signed)
 Synopsis: Referred in December for shortness of breath  Subjective:   PATIENT ID: Charles Villa GENDER: male DOB: [birth year], MRN: [id number]   HPI  Chief Complaint  Patient presents with   Follow-up    Pft f/u, pt is still having sob during exertion. Phlegm w/ coughing in am (x59yr)   Charles Villa is a 78 year old male, never smoker with GERD, chronic back pain, atrial fibrillation and hypertension who returns to pulmonary clinic for shortness of breath.   Initial OV 07/10/23 He presents with shortness of breath on exertion, worsening over the past couple of years. They notice it particularly when hiking or pushing a lawnmower, and even walking up their inclined driveway leaves them 'huffing and puffing.' They deny any associated wheezing or cough, and it does not disturb their sleep. They do report some sinus drainage and occasional 'gobs of slime' in their throat upon waking. They deny any seasonal variation or known allergies. They have never smoked, but were exposed to secondhand smoke growing up and during their early working years. They also report intermittent dust exposure from their previous job working on Engineer, [employer]. They are concerned about indoor air quality at home, as both they and their wife experience morning cough, runny nose, and sneezing, and they notice a significant amount of dust despite regular furnace filter changes. They retired four years ago and admit to a decrease in activity level since then. His father had lung cancer and died at age 85.   OV 09/21/2023 He underwent a breathing test which showed all numbers within the normal range, indicating no issues with lung function. A CT scan performed last month revealed scattered areas of possible inflammation, specifically peribronchovascular nodularity and ground glass opacities, but no signs of scarring or significant abnormalities. A 9 mm nodule was noted in the left upper lung, and a follow-up  scan was recommended to monitor this finding.  He experiences shortness of breath and coughs up mucus in the mornings, which resolves by around 10 AM. No significant changes in his symptoms since the last visit, stating that they are 'about the same.'  He describes his physical activity level as low, referring to himself as a 'couch potato.' He has a history of being more active in his youth, including cycling and swimming, but currently has concerns about his balance and stability, particularly when considering biking. He has not noticed any significant changes in his physical activity level, and he has not been engaging in regular exercise.   Past Medical History:  Diagnosis Date   Adenomatous colon polyp 11/02/2019   Anal fissure 08/17/2012   Really more gluteal crease irritation- protosol hc 2.5% prn    BACK PAIN, CHRONIC 02/10/2007   CAD (coronary artery disease), native coronary artery    02/24/17-PCI/DES to mLcx, normal EF.   Diverticulosis of colon    2007   GERD 02/23/2007   GERD (gastroesophageal reflux disease)    Hyperlipidemia    Hypertension    OCD (obsessive compulsive disorder) 01/23/2016   Ocd/hoarding issues- psychiatristis- [place] started on zoloft 100mg --> paxil    Personal history of colonic polyps 03/14/2014   08/2005. Benign. 10 year repeat. 2010 colonoscopy in record was entered erroneously.     PSORIASIS 02/10/2007   Topical therapy.      Scalp psoriasis    SQUAMOUS CELL CARCINOMA OF SKIN SITE UNSPECIFIED 08/24/2010   Annotation: face      Family History  Problem Relation Age of Onset  Stroke Mother    Hypertension Mother    Lung cancer Father        heavy smoker   Colon polyps Neg Hx    Colon cancer Neg Hx    Esophageal cancer Neg Hx    Rectal cancer Neg Hx    Stomach cancer Neg Hx      Social History   Socioeconomic History   Marital status: Married    Spouse name: Not on file   Number of children: 2   Years of education: Not on file   Highest  education level: Not on file  Occupational History   Occupation: Solicitor: UNEMPLOYED   Occupation: retired  Tobacco Use   Smoking status: Never    Passive exposure: Past   Smokeless tobacco: Never  Vaping Use   Vaping status: Never Used  Substance and Sexual Activity   Alcohol use: Yes    Alcohol/week: 21.0 standard drinks of alcohol    Types: 21 Standard drinks or equivalent per week    Comment: 2-3 per day (gin)   Drug use: No   Sexual activity: Not on file  Other Topics Concern   Not on file  Social History Narrative   Married 42 years in 2015. 2 kids, 1 grandchild that is 47.57 years old.       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      Hobbies: ride bike, ski in winter    Social Drivers of Health   Financial Resource Strain: Low Risk  (06/18/2022)   Overall Financial Resource Strain (CARDIA)    Difficulty of Paying Living Expenses: Not hard at all  Food Insecurity: No Food Insecurity (06/18/2022)   Hunger Vital Sign    Worried About Running Out of Food in the Last Year: Never true    Ran Out of Food in the Last Year: Never true  Transportation Needs: No Transportation Needs (06/18/2022)   PRAPARE - Administrator, Civil Service (Medical): No    Lack of Transportation (Non-Medical): No  Physical Activity: Inactive (06/18/2022)   Exercise Vital Sign    Days of Exercise per Week: 0 days    Minutes of Exercise per Session: 0 min  Stress: Stress Concern Present (06/18/2022)   [employer] of Occupational Health - Occupational Stress Questionnaire    Feeling of Stress : To some extent  Social Connections: Moderately Integrated (06/18/2022)   Social Connection and Isolation Panel [NHANES]    Frequency of Communication with Friends and Family: Once a week    Frequency of Social Gatherings with Friends and Family: More than three times a week    Attends Religious Services: More than 4 times per year    Active  Member of [employer] or Organizations: No    Attends Banker Meetings: Never    Marital Status: Married  Catering Manager Violence: Not At Risk (06/18/2022)   Humiliation, Afraid, Rape, and Kick questionnaire    Fear of Current or Ex-Partner: No    Emotionally Abused: No    Physically Abused: No    Sexually Abused: No     No Known Allergies   Outpatient Medications Prior to Visit  Medication Sig Dispense Refill   albuterol  (VENTOLIN  HFA) 108 (90 Base) MCG/ACT inhaler Inhale 2 puffs into the lungs every 6 (six) hours as needed for wheezing or shortness of breath. 8 g 6   apixaban  (ELIQUIS ) 5 MG TABS tablet Take 1 tablet (  5 mg total) by mouth 2 (two) times daily. 60 tablet 11   clobetasol (TEMOVATE) 0.05 % external solution Apply 1 application topically at bedtime as needed (scalp).   5   clobetasol cream (TEMOVATE) 0.05 % Apply 1 application topically 2 (two) times daily as needed (abdomen).   3   clotrimazole -betamethasone  (LOTRISONE ) cream Apply 1 application topically 2 (two) times daily as needed. psoriass     desonide  (DESONATE ) 0.05 % gel Apply 1 application  topically 2 (two) times daily as needed. Skin lesions     fenofibrate  160 MG tablet Take 1 tablet (160 mg total) by mouth daily. 90 tablet 3   fluocinolone (VANOS) 0.01 % cream Apply 1 application topically daily as needed.      hydrocortisone  (ANUSOL -HC) 2.5 % rectal cream Place 1 application rectally 2 (two) times daily as needed for hemorrhoids or anal itching.     icosapent  Ethyl (VASCEPA ) 1 g capsule TAKE 2 CAPSULES BY MOUTH TWO TIMES A DAY. 120 capsule 5   ketorolac (ACULAR) 0.5 % ophthalmic solution Place 1 drop into the left eye 4 (four) times daily.     rosuvastatin  (CRESTOR ) 40 MG tablet TAKE 1 TABLET BY MOUTH EVERY DAY 90 tablet 1   silver sulfADIAZINE (SILVADENE) 1 % cream Apply 1 application topically 2 (two) times daily as needed (groin).   5   pantoprazole  (PROTONIX ) 40 MG tablet TAKE 1 TABLET BY MOUTH  EVERY DAY 90 tablet 1   gatifloxacin (ZYMAXID) 0.5 % SOLN Place 1 drop into the left eye 4 (four) times daily. (Patient not taking: Reported on 09/03/2023)     prednisoLONE acetate (PRED FORTE) 1 % ophthalmic suspension Place 1 drop into the left eye. (Patient not taking: Reported on 09/03/2023)     No facility-administered medications prior to visit.    Review of Systems  Constitutional:  Negative for chills, fever, malaise/fatigue and weight loss.  HENT:  Negative for congestion, sinus pain and sore throat.   Eyes: Negative.   Respiratory:  Positive for shortness of breath. Negative for cough, hemoptysis, sputum production and wheezing.   Cardiovascular:  Negative for chest pain, palpitations, orthopnea, claudication and leg swelling.  Gastrointestinal:  Negative for abdominal pain, heartburn, nausea and vomiting.  Genitourinary: Negative.   Musculoskeletal:  Negative for joint pain and myalgias.  Skin:  Negative for rash.  Neurological:  Negative for weakness.  Endo/Heme/Allergies: Negative.   Psychiatric/Behavioral: Negative.     Objective:   Vitals:   09/03/23 1334  BP: 128/60  Pulse: 68  SpO2: 93%  Weight: 226 lb (102.5 kg)  Height: 5' 11 (1.803 m)    Physical Exam Constitutional:      General: He is not in acute distress.    Appearance: Normal appearance.  Eyes:     General: No scleral icterus.    Conjunctiva/sclera: Conjunctivae normal.  Cardiovascular:     Rate and Rhythm: Normal rate and regular rhythm.  Pulmonary:     Breath sounds: No wheezing, rhonchi or rales.  Musculoskeletal:     Right lower leg: No edema.     Left lower leg: No edema.  Skin:    General: Skin is warm and dry.  Neurological:     General: No focal deficit present.    CBC    Component Value Date/Time   WBC 5.6 07/02/2023 1357   WBC 8.4 01/30/2023 2005   RBC 4.46 07/02/2023 1357   RBC 4.30 01/30/2023 2005   HGB 16.2 07/02/2023 1357  HCT 46.9 07/02/2023 1357   PLT 154 07/02/2023  1357   MCV 105 (H) 07/02/2023 1357   MCH 36.3 (H) 07/02/2023 1357   MCH 34.7 (H) 01/30/2023 2005   MCHC 34.5 07/02/2023 1357   MCHC 34.2 01/30/2023 2005   RDW 11.8 07/02/2023 1357   LYMPHSABS 2.1 10/21/2022 1347   MONOABS 0.5 10/21/2022 1347   EOSABS 0.1 10/21/2022 1347   BASOSABS 0.0 10/21/2022 1347      Latest Ref Rng & Units 01/30/2023    8:05 PM 11/15/2022    3:13 PM 10/21/2022    1:47 PM  BMP  Glucose 70 - 99 mg/dL 896  893  891   BUN 8 - 23 mg/dL 14  14  17    Creatinine 0.61 - 1.24 mg/dL 8.45  8.61  8.64   BUN/Creat Ratio 10 - 24  10    Sodium 135 - 145 mmol/L 137  139  138   Potassium 3.5 - 5.1 mmol/L 3.6  4.1  3.7   Chloride 98 - 111 mmol/L 106  103  105   CO2 22 - 32 mmol/L 18  21  22    Calcium  8.9 - 10.3 mg/dL 8.6  9.2  9.2    Chest imaging: CXR 01/30/23 Cardiac shadow is within normal limits. The lungs are well aerated bilaterally. No focal infiltrate or effusion is seen. No bony abnormality is noted.  PFT:    Latest Ref Rng & Units 09/03/2023   11:08 AM  PFT Results  FVC-Pre L 3.61  P  FVC-Predicted Pre % 81  P  FVC-Post L 3.73  P  FVC-Predicted Post % 84  P  Pre FEV1/FVC % % 78  P  Post FEV1/FCV % % 77  P  FEV1-Pre L 2.81  P  FEV1-Predicted Pre % 87  P  FEV1-Post L 2.85  P  DLCO uncorrected ml/min/mmHg 22.93  P  DLCO UNC% % 88  P  DLCO corrected ml/min/mmHg 22.00  P  DLCO COR %Predicted % 84  P  DLVA Predicted % 100  P  TLC L 6.05  P  TLC % Predicted % 83  P  RV % Predicted % 93  P    P Preliminary result    Labs:  Path:  Echo:  Heart Catheterization:    Assessment & Plan:   Shortness of breath - Plan: Cardiopulmonary exercise test, CANCELED: Cardiopulmonary exercise test  Lung nodule - Plan: CT Chest Wo Contrast, CANCELED: CT Chest Wo Contrast  Abnormal CT of the chest - Plan: CT Chest Wo Contrast, CANCELED: CT Chest Wo Contrast  Discussion: Charles Villa is a 78 year old male, never smoker with GERD, chronic back pain, atrial  fibrillation and hypertension who is referred to pulmonary clinic for shortness of breath.  Pulmonary Nodules CT scan showed scattered peribronchovascular nodularity and ground glass opacities, possibly due to inflammation or resolving infection. A 9mm nodule consolidation was found in the left upper lung, not strongly suspected to be malignant. Patient's risk factors are low. -Schedule repeat CT scan in January 2026 to monitor nodules and other findings. -Consider repeating breathing test to monitor lung function.  Deconditioning Patient reports low physical activity level, which may be contributing to shortness of breath. -Encourage increased physical activity, such as walking or cycling, as tolerated. -Suggest use of walking aids (e.g., hiking poles, walking stick) for stability if needed.  Cardiopulmonary Exercise Test To further evaluate the cause of the patient's shortness of breath, a cardiopulmonary exercise test  was discussed. -Schedule cardiopulmonary exercise test in April/May 2025.  Follow-up Plan to review results of cardiopulmonary exercise test and discuss any changes in symptoms or activity level. -Schedule follow-up appointment in three months.  Dorn Chill, MD [facility] Office: [phone]   Current Outpatient Medications:    albuterol  (VENTOLIN  HFA) 108 (90 Base) MCG/ACT inhaler, Inhale 2 puffs into the lungs every 6 (six) hours as needed for wheezing or shortness of breath., Disp: 8 g, Rfl: 6   apixaban  (ELIQUIS ) 5 MG TABS tablet, Take 1 tablet (5 mg total) by mouth 2 (two) times daily., Disp: 60 tablet, Rfl: 11   clobetasol (TEMOVATE) 0.05 % external solution, Apply 1 application topically at bedtime as needed (scalp). , Disp: , Rfl: 5   clobetasol cream (TEMOVATE) 0.05 %, Apply 1 application topically 2 (two) times daily as needed (abdomen). , Disp: , Rfl: 3   clotrimazole -betamethasone  (LOTRISONE ) cream, Apply 1 application topically 2  (two) times daily as needed. psoriass, Disp: , Rfl:    desonide  (DESONATE ) 0.05 % gel, Apply 1 application  topically 2 (two) times daily as needed. Skin lesions, Disp: , Rfl:    fenofibrate  160 MG tablet, Take 1 tablet (160 mg total) by mouth daily., Disp: 90 tablet, Rfl: 3   fluocinolone (VANOS) 0.01 % cream, Apply 1 application topically daily as needed. , Disp: , Rfl:    hydrocortisone  (ANUSOL -HC) 2.5 % rectal cream, Place 1 application rectally 2 (two) times daily as needed for hemorrhoids or anal itching., Disp: , Rfl:    icosapent  Ethyl (VASCEPA ) 1 g capsule, TAKE 2 CAPSULES BY MOUTH TWO TIMES A DAY., Disp: 120 capsule, Rfl: 5   ketorolac (ACULAR) 0.5 % ophthalmic solution, Place 1 drop into the left eye 4 (four) times daily., Disp: , Rfl:    rosuvastatin  (CRESTOR ) 40 MG tablet, TAKE 1 TABLET BY MOUTH EVERY DAY, Disp: 90 tablet, Rfl: 1   silver sulfADIAZINE (SILVADENE) 1 % cream, Apply 1 application topically 2 (two) times daily as needed (groin). , Disp: , Rfl: 5   gatifloxacin (ZYMAXID) 0.5 % SOLN, Place 1 drop into the left eye 4 (four) times daily. (Patient not taking: Reported on 09/03/2023), Disp: , Rfl:    pantoprazole  (PROTONIX ) 40 MG tablet, TAKE 1 TABLET BY MOUTH EVERY DAY, Disp: 90 tablet, Rfl: 1   prednisoLONE acetate (PRED FORTE) 1 % ophthalmic suspension, Place 1 drop into the left eye. (Patient not taking: Reported on 09/03/2023), Disp: , Rfl:

## 2023-09-03 NOTE — Patient Instructions (Signed)
 Full PFT performed today.

## 2023-09-04 LAB — PULMONARY FUNCTION TEST
DL/VA % pred: 100 %
DL/VA: 3.98 ml/min/mmHg/L
DLCO cor % pred: 84 %
DLCO cor: 22 ml/min/mmHg
DLCO unc % pred: 88 %
DLCO unc: 22.93 ml/min/mmHg
FEF 25-75 Post: 2.51 L/s
FEF 25-75 Pre: 2.45 L/s
FEF2575-%Change-Post: 2 %
FEF2575-%Pred-Post: 108 %
FEF2575-%Pred-Pre: 105 %
FEV1-%Change-Post: 1 %
FEV1-%Pred-Post: 89 %
FEV1-%Pred-Pre: 87 %
FEV1-Post: 2.85 L
FEV1-Pre: 2.81 L
FEV1FVC-%Change-Post: -1 %
FEV1FVC-%Pred-Pre: 107 %
FEV6-%Change-Post: 3 %
FEV6-%Pred-Post: 90 %
FEV6-%Pred-Pre: 87 %
FEV6-Post: 3.73 L
FEV6-Pre: 3.61 L
FEV6FVC-%Pred-Post: 106 %
FEV6FVC-%Pred-Pre: 106 %
FVC-%Change-Post: 3 %
FVC-%Pred-Post: 84 %
FVC-%Pred-Pre: 81 %
FVC-Post: 3.73 L
FVC-Pre: 3.61 L
Post FEV1/FVC ratio: 77 %
Post FEV6/FVC ratio: 100 %
Pre FEV1/FVC ratio: 78 %
Pre FEV6/FVC Ratio: 100 %
RV % pred: 93 %
RV: 2.43 L
TLC % pred: 83 %
TLC: 6.05 L

## 2023-09-14 ENCOUNTER — Encounter: Payer: Self-pay | Admitting: Pulmonary Disease

## 2023-09-24 ENCOUNTER — Ambulatory Visit: Payer: Medicare HMO

## 2023-09-24 DIAGNOSIS — D485 Neoplasm of uncertain behavior of skin: Secondary | ICD-10-CM | POA: Diagnosis not present

## 2023-09-24 DIAGNOSIS — L400 Psoriasis vulgaris: Secondary | ICD-10-CM | POA: Diagnosis not present

## 2023-09-24 DIAGNOSIS — L738 Other specified follicular disorders: Secondary | ICD-10-CM | POA: Diagnosis not present

## 2023-09-24 DIAGNOSIS — L570 Actinic keratosis: Secondary | ICD-10-CM | POA: Diagnosis not present

## 2023-09-24 DIAGNOSIS — L98499 Non-pressure chronic ulcer of skin of other sites with unspecified severity: Secondary | ICD-10-CM | POA: Diagnosis not present

## 2023-09-24 DIAGNOSIS — D044 Carcinoma in situ of skin of scalp and neck: Secondary | ICD-10-CM | POA: Diagnosis not present

## 2023-10-21 DIAGNOSIS — D044 Carcinoma in situ of skin of scalp and neck: Secondary | ICD-10-CM | POA: Diagnosis not present

## 2023-11-13 ENCOUNTER — Ambulatory Visit

## 2023-12-04 ENCOUNTER — Encounter (HOSPITAL_COMMUNITY): Payer: Self-pay

## 2023-12-11 ENCOUNTER — Ambulatory Visit

## 2023-12-11 VITALS — BP 110/62 | HR 80 | Temp 98.2°F | Ht 71.0 in | Wt 224.2 lb

## 2023-12-11 DIAGNOSIS — Z1211 Encounter for screening for malignant neoplasm of colon: Secondary | ICD-10-CM | POA: Diagnosis not present

## 2023-12-11 DIAGNOSIS — Z860100 Personal history of colon polyps, unspecified: Secondary | ICD-10-CM | POA: Diagnosis not present

## 2023-12-11 DIAGNOSIS — Z0000 Encounter for general adult medical examination without abnormal findings: Secondary | ICD-10-CM | POA: Diagnosis not present

## 2023-12-11 NOTE — Progress Notes (Signed)
 Subjective:   Charles Villa is a 78 y.o. who presents for a Medicare Wellness preventive visit.  As a reminder, Annual Wellness Visits don't include a physical exam, and some assessments may be limited, especially if this visit is performed virtually. We may recommend an in-person visit if needed.  Visit Complete: In person    Persons Participating in Visit: Patient.  AWV Questionnaire: No: Patient Medicare AWV questionnaire was not completed prior to this visit.  Cardiac Risk Factors include: advanced age (>65men, >26 women);dyslipidemia;hypertension;male gender;obesity (BMI >30kg/m2)     Objective:     Today's Vitals   12/11/23 1432  BP: 110/62  Pulse: 80  Temp: 98.2 F (36.8 C)  SpO2: 94%  Weight: 224 lb 3.2 oz (101.7 kg)  Height: 5\' 11"  (1.803 m)   Body mass index is 31.27 kg/m.     12/11/2023    2:37 PM 02/13/2023    3:01 PM 01/30/2023    7:01 PM 06/18/2022   10:39 AM 02/11/2020   11:17 AM 02/04/2018    1:29 PM 02/24/2017    9:11 AM  Advanced Directives  Does Patient Have a Medical Advance Directive? No Yes No Yes Yes No Yes  Type of Aeronautical engineer of [place];Living will Healthcare Power of [place];Living will  Healthcare Power of [place];Living will  Does patient want to make changes to medical advance directive?  No - Patient declined     No - Patient declined  Copy of Healthcare Power of Attorney in Chart?    No - copy requested No - copy requested  No - copy requested  Would patient like information on creating a medical advance directive? No - Patient declined     No - Patient declined     Current Medications (verified) Outpatient Encounter Medications as of 12/11/2023  Medication Sig   apixaban  (ELIQUIS ) 5 MG TABS tablet Take 1 tablet (5 mg total) by mouth 2 (two) times daily.   clobetasol (TEMOVATE) 0.05 % external solution Apply 1 application topically at bedtime as needed (scalp).    clobetasol cream (TEMOVATE) 0.05 % Apply  1 application topically 2 (two) times daily as needed (abdomen).    clotrimazole -betamethasone  (LOTRISONE ) cream Apply 1 application topically 2 (two) times daily as needed. psoriass   desonide  (DESONATE ) 0.05 % gel Apply 1 application  topically 2 (two) times daily as needed. Skin lesions   fenofibrate  160 MG tablet Take 1 tablet (160 mg total) by mouth daily.   fluocinolone (VANOS) 0.01 % cream Apply 1 application topically daily as needed.    FLUZONE HIGH-DOSE 0.5 ML injection    hydrocortisone  (ANUSOL -HC) 2.5 % rectal cream Place 1 application rectally 2 (two) times daily as needed for hemorrhoids or anal itching.   icosapent  Ethyl (VASCEPA ) 1 g capsule TAKE 2 CAPSULES BY MOUTH TWO TIMES A DAY.   pantoprazole  (PROTONIX ) 40 MG tablet TAKE 1 TABLET BY MOUTH EVERY DAY   rosuvastatin  (CRESTOR ) 40 MG tablet TAKE 1 TABLET BY MOUTH EVERY DAY   silver sulfADIAZINE (SILVADENE) 1 % cream Apply 1 application topically 2 (two) times daily as needed (groin).    albuterol  (VENTOLIN  HFA) 108 (90 Base) MCG/ACT inhaler Inhale 2 puffs into the lungs every 6 (six) hours as needed for wheezing or shortness of breath. (Patient not taking: Reported on 12/11/2023)   [DISCONTINUED] gatifloxacin (ZYMAXID) 0.5 % SOLN Place 1 drop into the left eye 4 (four) times daily. (Patient not taking: Reported on 09/03/2023)   [  DISCONTINUED] ketorolac (ACULAR) 0.5 % ophthalmic solution Place 1 drop into the left eye 4 (four) times daily.   [DISCONTINUED] prednisoLONE acetate (PRED FORTE) 1 % ophthalmic suspension Place 1 drop into the left eye. (Patient not taking: Reported on 09/03/2023)   No facility-administered encounter medications on file as of 12/11/2023.    Allergies (verified) Patient has no known allergies.   History: Past Medical History:  Diagnosis Date   Adenomatous colon polyp 11/02/2019   Anal fissure 08/17/2012   Really more gluteal crease irritation- protosol hc 2.5% prn    BACK PAIN, CHRONIC 02/10/2007   CAD  (coronary artery disease), native coronary artery    02/24/17-PCI/DES to mLcx, normal EF.   Diverticulosis of colon    2007   GERD 02/23/2007   GERD (gastroesophageal reflux disease)    Hyperlipidemia    Hypertension    OCD (obsessive compulsive disorder) 01/23/2016   Ocd/hoarding issues- psychiatristis- [place] started on zoloft 100mg --> paxil    Personal history of colonic polyps 03/14/2014   08/2005. Benign. 10 year repeat. 2010 colonoscopy in record was entered erroneously.     PSORIASIS 02/10/2007   Topical therapy.      Scalp psoriasis    SQUAMOUS CELL CARCINOMA OF SKIN SITE UNSPECIFIED 08/24/2010   Annotation: face    Past Surgical History:  Procedure Laterality Date   COLONOSCOPY     COLONOSCOPY W/ POLYPECTOMY     2007 Dr Arvie Latus   CORONARY STENT INTERVENTION N/A 02/24/2017   Procedure: Coronary Stent Intervention;  Surgeon: Arleen Lacer, MD;  Location: [facility] INVASIVE CV LAB;  Service: Cardiovascular;  Laterality: N/A;   LEFT HEART CATH AND CORONARY ANGIOGRAPHY N/A 02/24/2017   Procedure: Left Heart Cath and Coronary Angiography;  Surgeon: Arleen Lacer, MD;  Location: [facility] INVASIVE CV LAB;  Service: Cardiovascular;  Laterality: N/A;   POLYPECTOMY     SKIN CANCER EXCISION     squamous cell CA - nose   TONSILLECTOMY     Family History  Problem Relation Age of Onset   Stroke Mother    Hypertension Mother    Lung cancer Father        heavy smoker   Colon polyps Neg Hx    Colon cancer Neg Hx    Esophageal cancer Neg Hx    Rectal cancer Neg Hx    Stomach cancer Neg Hx    Social History   Socioeconomic History   Marital status: Married    Spouse name: Not on file   Number of children: 2   Years of education: Not on file   Highest education level: Not on file  Occupational History   Occupation: Solicitor: UNEMPLOYED   Occupation: retired  Tobacco Use   Smoking status: Never    Passive exposure: Past   Smokeless tobacco: Never  Vaping Use    Vaping status: Never Used  Substance and Sexual Activity   Alcohol use: Yes    Alcohol/week: 21.0 standard drinks of alcohol    Types: 21 Standard drinks or equivalent per week    Comment: 2-3 per day (gin)   Drug use: No   Sexual activity: Not on file  Other Topics Concern   Not on file  Social History Narrative   Married 42 years in 2015. 2 kids, 1 grandchild that is 67.48 years old.       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      Hobbies: ride bike, ski  in winter    Social Drivers of Health   Financial Resource Strain: Low Risk  (12/11/2023)   Overall Financial Resource Strain (CARDIA)    Difficulty of Paying Living Expenses: Not hard at all  Food Insecurity: No Food Insecurity (12/11/2023)   Hunger Vital Sign    Worried About Running Out of Food in the Last Year: Never true    Ran Out of Food in the Last Year: Never true  Transportation Needs: No Transportation Needs (12/11/2023)   PRAPARE - Administrator, Civil Service (Medical): No    Lack of Transportation (Non-Medical): No  Physical Activity: Inactive (12/11/2023)   Exercise Vital Sign    Days of Exercise per Week: 0 days    Minutes of Exercise per Session: 0 min  Stress: No Stress Concern Present (12/11/2023)   [employer] of Occupational Health - Occupational Stress Questionnaire    Feeling of Stress : Not at all  Social Connections: Moderately Integrated (12/11/2023)   Social Connection and Isolation Panel [NHANES]    Frequency of Communication with Friends and Family: Once a week    Frequency of Social Gatherings with Friends and Family: More than three times a week    Attends Religious Services: More than 4 times per year    Active Member of [employer] or Organizations: No    Attends Engineer, structural: Never    Marital Status: Married    Tobacco Counseling Counseling given: Not Answered    Clinical Intake:  Pre-visit preparation completed: Yes  Pain : No/denies  pain     BMI - recorded: 31.27 Nutritional Status: BMI > 30  Obese Nutritional Risks: None Diabetes: No  No results found for: "HGBA1C"   How often do you need to have someone help you when you read instructions, pamphlets, or other written materials from your doctor or pharmacy?: 1 - Never  Interpreter Needed?: No  Information entered by :: Lamont Pilsner, LPN   Activities of Daily Living     12/11/2023    2:34 PM  In your present state of health, do you have any difficulty performing the following activities:  Hearing? 0  Vision? 0  Difficulty concentrating or making decisions? 0  Walking or climbing stairs? 1  Comment joint pain  Dressing or bathing? 0  Doing errands, shopping? 0  Preparing Food and eating ? N  Using the Toilet? N  In the past six months, have you accidently leaked urine? N  Do you have problems with loss of bowel control? N  Managing your Medications? N  Managing your Finances? N  Housekeeping or managing your Housekeeping? N    Patient Care Team: Almira Jaeger, MD as PCP - General (Family Medicine) Maximo Spar Aviva Lemmings, MD as PCP - Cardiology (Cardiology) Maximo Spar Aviva Lemmings, MD as Consulting Physician (Cardiology) Gita Lamb, DMD as Consulting Physician (Dentistry) Myrle Aspen, [facility] (Inactive) (Pharmacist)  Indicate any recent Medical Services you may have received from other than [facility] providers in the past year (date may be approximate).     Assessment:    This is a routine wellness examination for Jakin.  Hearing/Vision screen Hearing Screening - Comments:: Pt denies any hearing issues  Vision Screening - Comments:: Wears rx glasses - up to date with routine eye exams with [facility]     Goals Addressed             This Visit's Progress    Patient Stated  Lose weight        Depression Screen     12/11/2023    2:40 PM 06/18/2022   10:37 AM 08/15/2021   10:56 AM 02/06/2021   10:00 AM 08/08/2020    9:50 AM  02/11/2020   11:15 AM 11/15/2019    9:58 AM  PHQ 2/9 Scores  PHQ - 2 Score 0 0 0 0 0 0 0  PHQ- 9 Score   0    2    Fall Risk     12/11/2023    2:41 PM 07/10/2023   10:44 AM 06/18/2022   10:40 AM 08/15/2021   10:57 AM 02/06/2021   10:00 AM  Fall Risk   Falls in the past year? 0 1 0 0 0  Number falls in past yr: 0 0 0 0 0  Injury with Fall? 0 1 0 0 0  Risk for fall due to : Impaired mobility  Impaired vision;Impaired balance/gait  No Fall Risks  Follow up Falls prevention discussed  Falls prevention discussed  Falls evaluation completed    MEDICARE RISK AT HOME:  Medicare Risk at Home Any stairs in or around the home?: Yes If so, are there any without handrails?: No Home free of loose throw rugs in walkways, pet beds, electrical cords, etc?: Yes Adequate lighting in your home to reduce risk of falls?: Yes Life alert?: No Use of a cane, walker or w/c?: No Grab bars in the bathroom?: Yes Shower chair or bench in shower?: No Elevated toilet seat or a handicapped toilet?: No  TIMED UP AND GO:  Was the test performed?  Yes  Length of time to ambulate 10 feet: 20 sec Gait slow and steady without use of assistive device  Cognitive Function: 6CIT completed        12/11/2023    2:42 PM 06/18/2022   10:40 AM 02/11/2020   11:22 AM  6CIT Screen  What Year? 0 points 0 points 0 points  What month? 0 points 0 points 0 points  What time? 0 points 0 points 0 points  Count back from 20 0 points 0 points 0 points  Months in reverse 0 points 0 points 0 points  Repeat phrase 2 points 0 points 0 points  Total Score 2 points 0 points 0 points    Immunizations Immunization History  Administered Date(s) Administered   DTP 08/10/2007   Fluad Quad(high Dose 65+) 05/06/2019   Influenza Split 06/08/2012   Influenza Whole 08/21/2009   Influenza, High Dose Seasonal PF 05/12/2013, 05/06/2016, 05/05/2018   Influenza, Seasonal, Injecte, Preservative Fre 05/23/2013   Influenza,inj,Quad PF,6+  Mos 07/12/2020   Influenza-Unspecified 05/01/2015, 06/29/2023   PFIZER Comirnaty(Gray Top)Covid-19 Tri-Sucrose Vaccine 11/30/2020   PFIZER(Purple Top)SARS-COV-2 Vaccination 09/03/2019, 09/24/2019   Pfizer Covid-19 Vaccine Bivalent Booster 37yrs & up 08/23/2021   Pneumococcal Conjugate-13 08/30/2013   Pneumococcal Polysaccharide-23 03/11/2014   Respiratory Syncytial Virus Vaccine,Recomb Aduvanted(Arexvy) 07/01/2022   Td 07/29/1998, 08/15/2008   Tdap 10/07/2016    Screening Tests Health Maintenance  Topic Date Due   Zoster Vaccines- Shingrix (1 of 2) Never done   Colonoscopy  10/21/2022   COVID-19 Vaccine (5 - 2024-25 season) 03/30/2023   INFLUENZA VACCINE  02/27/2024   Medicare Annual Wellness (AWV)  12/10/2024   DTaP/Tdap/Td (5 - Td or Tdap) 10/08/2026   Pneumonia Vaccine 62+ Years old  Completed   Hepatitis C Screening  Completed   HPV VACCINES  Aged Out   Meningococcal B Vaccine  Aged Out  Health Maintenance  Health Maintenance Due  Topic Date Due   Zoster Vaccines- Shingrix (1 of 2) Never done   Colonoscopy  10/21/2022   COVID-19 Vaccine (5 - 2024-25 season) 03/30/2023   Health Maintenance Items Addressed: See Nurse Notes  Additional Screening:  Vision Screening: Recommended annual ophthalmology exams for early detection of glaucoma and other disorders of the eye.  Dental Screening: Recommended annual dental exams for proper oral hygiene  Community Resource Referral / Chronic Care Management: CRR required this visit?  No   CCM required this visit?  No   Plan:    I have personally reviewed and noted the following in the patient's chart:   Medical and social history Use of alcohol, tobacco or illicit drugs  Current medications and supplements including opioid prescriptions. Patient is not currently taking opioid prescriptions. Functional ability and status Nutritional status Physical activity Advanced directives List of other  physicians Hospitalizations, surgeries, and ER visits in previous 12 months Vitals Screenings to include cognitive, depression, and falls Referrals and appointments  In addition, I have reviewed and discussed with patient certain preventive protocols, quality metrics, and best practice recommendations. A written personalized care plan for preventive services as well as general preventive health recommendations were provided to patient.   Bruno Capri, LPN   11/04/8117   After Visit Summary: (MyChart) Due to this being a telephonic visit, the after visit summary with patients personalized plan was offered to patient via MyChart   Notes: Nothing significant to report at this time.

## 2023-12-11 NOTE — Patient Instructions (Signed)
 Charles Villa , Thank you for taking time out of your busy schedule to complete your Annual Wellness Visit with me. I enjoyed our conversation and look forward to speaking with you again next year. I, as well as your care team,  appreciate your ongoing commitment to your health goals. Please review the following plan we discussed and let me know if I can assist you in the future. Your Game plan/ To Do List    Referrals: If you haven't heard from the office you've been referred to, please reach out to them at the phone provided.  [facility]  [address]  [phone] Follow up Visits: Next Medicare AWV with our clinical staff: 12/23/24   Have you seen your provider in the last 6 months (3 months if uncontrolled diabetes)? No Next Office Visit with your provider: 05/10/24  Clinician Recommendations:  Aim for 30 minutes of exercise or brisk walking, 6-8 glasses of water, and 5 servings of fruits and vegetables each day.       This is a list of the screening recommended for you and due dates:  Health Maintenance  Topic Date Due   Zoster (Shingles) Vaccine (1 of 2) Never done   Colon Cancer Screening  10/21/2022   COVID-19 Vaccine (5 - 2024-25 season) 03/30/2023   Medicare Annual Wellness Visit  06/19/2023   Flu Shot  02/27/2024   DTaP/Tdap/Td vaccine (5 - Td or Tdap) 10/08/2026   Pneumonia Vaccine  Completed   Hepatitis C Screening  Completed   HPV Vaccine  Aged Out   Meningitis B Vaccine  Aged Out    Advanced directives: (Declined) Advance directive discussed with you today. Even though you declined this today, please call our office should you change your mind, and we can give you the proper paperwork for you to fill out. Advance Care Planning is important because it:  [x]  Makes sure you receive the medical care that is consistent with your values, goals, and preferences  [x]  It provides guidance to your family and loved ones and reduces their decisional burden about whether  or not they are making the right decisions based on your wishes.  Follow the link provided in your after visit summary or read over the paperwork we have mailed to you to help you started getting your Advance Directives in place. If you need assistance in completing these, please reach out to us  so that we can help you!  See attachments for Preventive Care and Fall Prevention Tips.

## 2023-12-16 DIAGNOSIS — D044 Carcinoma in situ of skin of scalp and neck: Secondary | ICD-10-CM | POA: Diagnosis not present

## 2023-12-16 DIAGNOSIS — Z85828 Personal history of other malignant neoplasm of skin: Secondary | ICD-10-CM | POA: Diagnosis not present

## 2023-12-16 DIAGNOSIS — L218 Other seborrheic dermatitis: Secondary | ICD-10-CM | POA: Diagnosis not present

## 2023-12-20 ENCOUNTER — Other Ambulatory Visit: Payer: Self-pay | Admitting: Internal Medicine

## 2024-02-27 HISTORY — PX: TOOTH EXTRACTION: SUR596

## 2024-03-03 ENCOUNTER — Encounter: Payer: Self-pay | Admitting: Gastroenterology

## 2024-03-03 ENCOUNTER — Ambulatory Visit: Admitting: Gastroenterology

## 2024-03-03 ENCOUNTER — Telehealth: Payer: Self-pay

## 2024-03-03 VITALS — BP 96/64 | HR 75 | Ht 71.0 in | Wt 217.0 lb

## 2024-03-03 DIAGNOSIS — K5909 Other constipation: Secondary | ICD-10-CM

## 2024-03-03 DIAGNOSIS — K219 Gastro-esophageal reflux disease without esophagitis: Secondary | ICD-10-CM

## 2024-03-03 DIAGNOSIS — R1032 Left lower quadrant pain: Secondary | ICD-10-CM | POA: Diagnosis not present

## 2024-03-03 DIAGNOSIS — Z860100 Personal history of colon polyps, unspecified: Secondary | ICD-10-CM

## 2024-03-03 DIAGNOSIS — Z01818 Encounter for other preprocedural examination: Secondary | ICD-10-CM

## 2024-03-03 MED ORDER — NA SULFATE-K SULFATE-MG SULF 17.5-3.13-1.6 GM/177ML PO SOLN
1.0000 | Freq: Once | ORAL | 0 refills | Status: AC
Start: 2024-03-03 — End: 2024-03-03

## 2024-03-03 NOTE — Telephone Encounter (Signed)
 Patient will need a CBC and BMET for pharmacy to make recommendations. I check [facility] and [facility].  CBC was done in December 2024, and CMET in July 2024. They will need updated labs.  Please order. Thank you.

## 2024-03-03 NOTE — Telephone Encounter (Signed)
 Spoke with pt and scheduled TELE Preop appt 03/05/24 (Per Susanne Satterfield, NP) Med Rec and Consent done   Pt is also aware of Labs. Labs ordered CBC and BMET per Kathlyn Lawrence, NP)

## 2024-03-03 NOTE — Telephone Encounter (Signed)
 Med Rec and Consent done     Patient Consent for Virtual Visit        Charles Villa has provided verbal consent on 03/03/2024 for a virtual visit (video or telephone).   CONSENT FOR VIRTUAL VISIT FOR:  Charles Villa  By participating in this virtual visit I agree to the following:  I hereby voluntarily request, consent and authorize [facility] and its employed or contracted physicians, physician assistants, nurse practitioners or other licensed health care professionals (the Practitioner), to provide me with telemedicine health care services (the "Services) as deemed necessary by the treating Practitioner. I acknowledge and consent to receive the Services by the Practitioner via telemedicine. I understand that the telemedicine visit will involve communicating with the Practitioner through live audiovisual communication technology and the disclosure of certain medical information by electronic transmission. I acknowledge that I have been given the opportunity to request an in-person assessment or other available alternative prior to the telemedicine visit and am voluntarily participating in the telemedicine visit.  I understand that I have the right to withhold or withdraw my consent to the use of telemedicine in the course of my care at any time, without affecting my right to future care or treatment, and that the Practitioner or I may terminate the telemedicine visit at any time. I understand that I have the right to inspect all information obtained and/or recorded in the course of the telemedicine visit and may receive copies of available information for a reasonable fee.  I understand that some of the potential risks of receiving the Services via telemedicine include:  Delay or interruption in medical evaluation due to technological equipment failure or disruption; Information transmitted may not be sufficient (e.g. poor resolution of images) to allow for appropriate medical  decision making by the Practitioner; and/or  In rare instances, security protocols could fail, causing a breach of personal health information.  Furthermore, I acknowledge that it is my responsibility to provide information about my medical history, conditions and care that is complete and accurate to the best of my ability. I acknowledge that Practitioner's advice, recommendations, and/or decision may be based on factors not within their control, such as incomplete or inaccurate data provided by me or distortions of diagnostic images or specimens that may result from electronic transmissions. I understand that the practice of medicine is not an exact science and that Practitioner makes no warranties or guarantees regarding treatment outcomes. I acknowledge that a copy of this consent can be made available to me via my patient portal [facility] MyChart), or I can request a printed copy by calling the office of [facility].    I understand that my insurance will be billed for this visit.   I have read or had this consent read to me. I understand the contents of this consent, which adequately explains the benefits and risks of the Services being provided via telemedicine.  I have been provided ample opportunity to ask questions regarding this consent and the Services and have had my questions answered to my satisfaction. I give my informed consent for the services to be provided through the use of telemedicine in my medical care

## 2024-03-03 NOTE — Progress Notes (Signed)
 [facility] Consult Note:  History: Charles Villa 03/03/2024  Referring provider: [name], MD  Reason for consult/chief complaint: Colonoscopy (Hx of CP, last 2021, 3 yr recall) and Abdominal Pain (LLQ when needing to move Bowel, has BM every 3 to 4 days)   Subjective  HPI: 2 adenomatous polyps March 2017 5 subcentimeter adenomatous polyps March 2021  No prior EGD ________  Charles Villa was here today to discuss a colonoscopy for history of colon polyps.  He has also had a change in bowel habits where he is irregular at times, sometimes going a few days without a BM.  When that happens, he will get some left lower quadrant pain but the bowels will finally move and he does not feel like it is of struggle.  Also denies rectal bleeding. His appetite is generally good he denies nausea vomiting or dysphagia.  When I asked about the pantoprazole  he has on the medication list, he thinks it may have been started as long as a decade ago for frequent reflux symptoms that were insufficiently controlled with OTC meds.  He now takes that about 3 days a week and does not experience heartburn.  No known family history of esophageal or stomach cancer   ROS:  Review of Systems  Constitutional:  Positive for fatigue. Negative for appetite change and unexpected weight change.  HENT:  Negative for mouth sores and voice change.   Eyes:  Negative for pain and redness.  Respiratory:  Negative for cough and shortness of breath.   Cardiovascular:  Negative for chest pain and palpitations.  Genitourinary:  Negative for dysuria and hematuria.  Musculoskeletal:  Negative for arthralgias and myalgias.  Skin:  Negative for pallor and rash.  Neurological:  Positive for tremors. Negative for weakness and headaches.  Hematological:  Negative for adenopathy.     Past Medical History: Past Medical History:  Diagnosis Date   Adenomatous colon polyp 11/02/2019   Anal fissure 08/17/2012    Really more gluteal crease irritation- protosol hc 2.5% prn    BACK PAIN, CHRONIC 02/10/2007   CAD (coronary artery disease), native coronary artery    02/24/17-PCI/DES to mLcx, normal EF.   Diverticulosis of colon    2007   GERD 02/23/2007   GERD (gastroesophageal reflux disease)    Hyperlipidemia    Hypertension    OCD (obsessive compulsive disorder) 01/23/2016   Ocd/hoarding issues- psychiatristis- Cottle started on zoloft 100mg --> paxil    Personal history of colonic polyps 03/14/2014   08/2005. Benign. 10 year repeat. 2010 colonoscopy in record was entered erroneously.     PSORIASIS 02/10/2007   Topical therapy.      Scalp psoriasis    SQUAMOUS CELL CARCINOMA OF SKIN SITE UNSPECIFIED 08/24/2010   Annotation: face    2018 circumflex artery coronary stent A-fib on OAC Normal LVEF Last cardiology office note with Dr. [name] December 2024 reviewed.  Patient having progressive dyspnea felt likely to be due to with cardiac disease and other medical conditions. Patient's alcohol use also mentioned in that context  Past Surgical History: Past Surgical History:  Procedure Laterality Date   COLONOSCOPY     COLONOSCOPY W/ POLYPECTOMY     2007 Dr Debrah   CORONARY STENT INTERVENTION N/A 02/24/2017   Procedure: Coronary Stent Intervention;  Surgeon: Anner Alm ORN, MD;  Location: [facility] INVASIVE CV LAB;  Service: Cardiovascular;  Laterality: N/A;   LEFT HEART CATH AND CORONARY ANGIOGRAPHY N/A 02/24/2017   Procedure: Left Heart Cath and  Coronary Angiography;  Surgeon: Anner Alm ORN, MD;  Location: [facility] INVASIVE CV LAB;  Service: Cardiovascular;  Laterality: N/A;   POLYPECTOMY     SKIN CANCER EXCISION     squamous cell CA - nose   TONSILLECTOMY     TOOTH EXTRACTION  02/2024     Family History: Family History  Problem Relation Age of Onset   Stroke Mother    Hypertension Mother    Lung cancer Father        heavy smoker   Colon polyps Neg Hx    Colon cancer Neg Hx    Esophageal  cancer Neg Hx    Rectal cancer Neg Hx    Stomach cancer Neg Hx     Social History: Social History   Socioeconomic History   Marital status: Married    Spouse name: Not on file   Number of children: 2   Years of education: Not on file   Highest education level: Not on file  Occupational History   Occupation: Solicitor: UNEMPLOYED   Occupation: retired  Tobacco Use   Smoking status: Never    Passive exposure: Past   Smokeless tobacco: Never  Vaping Use   Vaping status: Never Used  Substance and Sexual Activity   Alcohol use: Yes    Alcohol/week: 21.0 standard drinks of alcohol    Types: 21 Standard drinks or equivalent per week    Comment: 2-3 per day (gin)   Drug use: No   Sexual activity: Not on file  Other Topics Concern   Not on file  Social History Narrative   Married 42 years in 2015. 2 kids, 1 grandchild that is 46.24 years old.       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      Hobbies: ride bike, ski in winter    Social Drivers of Health   Financial Resource Strain: Low Risk  (12/11/2023)   Overall Financial Resource Strain (CARDIA)    Difficulty of Paying Living Expenses: Not hard at all  Food Insecurity: No Food Insecurity (12/11/2023)   Hunger Vital Sign    Worried About Running Out of Food in the Last Year: Never true    Ran Out of Food in the Last Year: Never true  Transportation Needs: No Transportation Needs (12/11/2023)   PRAPARE - Administrator, Civil Service (Medical): No    Lack of Transportation (Non-Medical): No  Physical Activity: Inactive (12/11/2023)   Exercise Vital Sign    Days of Exercise per Week: 0 days    Minutes of Exercise per Session: 0 min  Stress: No Stress Concern Present (12/11/2023)   [employer] of Occupational Health - Occupational Stress Questionnaire    Feeling of Stress : Not at all  Social Connections: Moderately Integrated (12/11/2023)   Social Connection and  Isolation Panel    Frequency of Communication with Friends and Family: Once a week    Frequency of Social Gatherings with Friends and Family: More than three times a week    Attends Religious Services: More than 4 times per year    Active Member of [employer] or Organizations: No    Attends Banker Meetings: Never    Marital Status: Married    Allergies: No Known Allergies  Outpatient Meds: Current Outpatient Medications  Medication Sig Dispense Refill   albuterol  (VENTOLIN  HFA) 108 (90 Base) MCG/ACT inhaler Inhale 2 puffs into the lungs every 6 (six)  hours as needed for wheezing or shortness of breath. 8 g 6   amoxicillin (AMOXIL) 500 MG capsule Take 500 mg by mouth 3 (three) times daily.     apixaban  (ELIQUIS ) 5 MG TABS tablet Take 1 tablet (5 mg total) by mouth 2 (two) times daily. 60 tablet 11   clobetasol (TEMOVATE) 0.05 % external solution Apply 1 application topically at bedtime as needed (scalp).   5   clobetasol cream (TEMOVATE) 0.05 % Apply 1 application topically 2 (two) times daily as needed (abdomen).   3   clotrimazole -betamethasone  (LOTRISONE ) cream Apply 1 application topically 2 (two) times daily as needed. psoriass     desonide  (DESONATE ) 0.05 % gel Apply 1 application  topically 2 (two) times daily as needed. Skin lesions     fenofibrate  160 MG tablet Take 1 tablet (160 mg total) by mouth daily. 90 tablet 3   fluocinolone (VANOS) 0.01 % cream Apply 1 application topically daily as needed.      hydrocortisone  (ANUSOL -HC) 2.5 % rectal cream Place 1 application rectally 2 (two) times daily as needed for hemorrhoids or anal itching.     pantoprazole  (PROTONIX ) 40 MG tablet TAKE 1 TABLET BY MOUTH EVERY DAY (Patient taking differently: Take 40 mg by mouth daily. Two to three times per week) 90 tablet 1   polyethylene glycol (MIRALAX / GLYCOLAX) 17 g packet Take 17 g by mouth as needed.     rosuvastatin  (CRESTOR ) 40 MG tablet TAKE 1 TABLET BY MOUTH EVERY DAY 90 tablet 1    silver sulfADIAZINE (SILVADENE) 1 % cream Apply 1 application topically 2 (two) times daily as needed (groin).   5   VASCEPA  1 g capsule TAKE 2 CAPSULES BY MOUTH TWO TIMES A DAY. 120 capsule 5   No current facility-administered medications for this visit.      ___________________________________________________________________ Objective   Exam:  BP 96/64   Pulse 75   Ht 5' 11 (1.803 m)   Wt 217 lb (98.4 kg)   SpO2 94%   BMI 30.27 kg/m  Wt Readings from Last 3 Encounters:  03/03/24 217 lb (98.4 kg)  12/11/23 224 lb 3.2 oz (101.7 kg)  09/03/23 226 lb (102.5 kg)   Well-appearing, pleasant and conversational.  Mild tremor  Gets on exam table slowly and without assistance.  Appears to have momentary ENT: oral mucosa moist without lesions, no cervical or supraclavicular lymphadenopathy CV: Regular without appreciable murmur, no JVD, no peripheral edema Resp: clear to auscultation bilaterally, normal RR and effort noted GI: soft, no focal tenderness, with active bowel sounds. No guarding or palpable organomegaly noted. Skin; warm and dry, no rash or jaundice noted  Labs:  Normal LVEF and grade 1 diastolic dysfunction last echocardiogram September 2023  Assessment: Encounter Diagnoses  Name Primary?   LLQ abdominal pain Yes   Chronic constipation    Hx of colonic polyps    Gastroesophageal reflux disease, unspecified whether esophagitis present     Change in bowel habits since I last saw him with episodic constipation.  He has not noticed any clear food triggers nor had new meds to account for it.  The left lower quadrant pain he has sounds likely benign and related to constipation. History of colon polyps, due for surveillance. Longstanding reflux symptoms that sound under good control on PPI 3 days a week.  I discussed the potential for Barrett's esophagus and recommendation that he be screened with upper endoscopy.  Plan: MiraLAX half capful in a glass of  liquid,  start every other day and increase to daily if needed.  He seemed to recall primary care and mention this 1 time, so he apparently has a supply of this at home but has not used it.  I also told him that if he is doing this regularly, it will also likely lead to a better quality bowel preparation for his upcoming procedure.  EGD and colonoscopy.  He was agreeable after discussion of procedure and risks.   The benefits and risks of the planned procedure(s) were described in detail with the patient or (when appropriate) their health care proxy.  Risks were outlined as including, but not limited to, bleeding, infection, perforation, adverse medication reaction leading to cardiac or pulmonary decompensation, pancreatitis (if ERCP).  The limitation of incomplete mucosal visualization was also discussed.  No guarantees or warranties were given.  Hold Eliquis  36 to 48 hours before procedure.  Will communicate with his cardiology team about this, and I expect there will likely be agreeable. Omair is aware of the small real risk of stroke during the brief hold of Eliquis .  After EGD findings, can then discuss long-term acid suppression use of potential de-escalation of therapy.  Thank you for the courtesy of this consult.  Please call me with any questions or concerns.  Victory LITTIE Brand III  CC: Referring provider noted above

## 2024-03-03 NOTE — Addendum Note (Signed)
 Addended by: Arslan Kier A on: 03/03/2024 02:50 PM   Modules accepted: Orders

## 2024-03-03 NOTE — Telephone Encounter (Signed)
 [facility] Pre-operative Risk Assessment     Request for surgical clearance:     Endoscopy Procedure  What type of surgery is being performed?     EGD/Colon  When is this surgery scheduled?     04/08/24  What type of clearance is required ?   Pharmacy  Are there any medications that need to be held prior to surgery and how long? Eliquis , 2 days  Practice name and name of physician performing surgery?      [facility]  What is your office phone and fax number?      Phone- [phone]  Fax- [phone]  Anesthesia type (None, local, MAC, general) ?       MAC   Please route your response to Corean Amsterdam, [facility]

## 2024-03-03 NOTE — Patient Instructions (Signed)
 You have been scheduled for an endoscopy and colonoscopy. Please follow the written instructions given to you at your visit today.  If you use inhalers (even only as needed), please bring them with you on the day of your procedure.  DO NOT TAKE 7 DAYS PRIOR TO TEST- Trulicity (dulaglutide) Ozempic, Wegovy (semaglutide) Mounjaro (tirzepatide) Bydureon Bcise (exanatide extended release)  DO NOT TAKE 1 DAY PRIOR TO YOUR TEST Rybelsus (semaglutide) Adlyxin (lixisenatide) Victoza (liraglutide) Byetta (exanatide) ___________________________________________________________________________   Rosine will be contacted by our office prior to your procedure for directions on holding your Eliquis .  If you do not hear from our office 1 week prior to your scheduled procedure, please call [phone] to discuss.   _______________________________________________________  If your blood pressure at your visit was 140/90 or greater, please contact your primary care physician to follow up on this.  _______________________________________________________  If you are age 31 or older, your body mass index should be between 23-30. Your Body mass index is 30.27 kg/m. If this is out of the aforementioned range listed, please consider follow up with your Primary Care Provider.  If you are age 25 or younger, your body mass index should be between 19-25. Your Body mass index is 30.27 kg/m. If this is out of the aformentioned range listed, please consider follow up with your Primary Care Provider.   ________________________________________________________  The [facility] providers would like to encourage you to use MYCHART to communicate with providers for non-urgent requests or questions.  Due to long hold times on the telephone, sending your provider a message by [facility] may be a faster and more efficient way to get a response.  Please allow 48 business hours for a response.  Please remember that this is for  non-urgent requests.  _______________________________________________________  [facility] is using a team-based approach to care.  Your team is made up of your doctor and two to three APPS. Our APPS (Nurse Practitioners and Physician Assistants) work with your physician to ensure care continuity for you. They are fully qualified to address your health concerns and develop a treatment plan. They communicate directly with your gastroenterologist to care for you. Seeing the Advanced Practice Practitioners on your physician's team can help you by facilitating care more promptly, often allowing for earlier appointments, access to diagnostic testing, procedures, and other specialty referrals.    Thank you for trusting me with your gastrointestinal care!    Dr. Victory Legrand [facility]

## 2024-03-05 ENCOUNTER — Ambulatory Visit: Attending: Cardiology

## 2024-03-05 NOTE — Progress Notes (Signed)
   Name: NAYIB REMER  DOB: 09/14/1945  MRN: [id number]  Primary Cardiologist: Vinie JAYSON Maxcy, MD  Chart reviewed as part of pre-operative protocol coverage. Because of TRISTRAM MILIAN past medical history and time since last visit, he will require a follow-up in-office visit in order to better assess preoperative cardiovascular risk.  Pre-op  covering staff: - Please schedule appointment and call patient to inform them. If patient already had an upcoming appointment within acceptable timeframe, please add pre-op  clearance to the appointment notes so provider is aware. - Please contact requesting surgeon's office via preferred method (i.e, phone, fax) to inform them of need for appointment prior to surgery.  He will need lab work done before pharmacy can weigh in on his Eliquis  hold x 2 days.  I discussed with him today that he can make an appointment with [facility] to get his labs done and then arrange an office visit with us  for clearance.  Orren LOISE Fabry, PA-C  03/05/2024, 2:10 PM

## 2024-03-08 NOTE — Telephone Encounter (Signed)
 Tried calling pt and mail box was full. Was calling to check in with pt about when he will be going to get his lab work completed and inform pt that once lab work is completed will need to schedule IN OFFICE Preop appt

## 2024-03-11 NOTE — Telephone Encounter (Addendum)
 I s/w the pt and he agrees to have labs done by 03/19/24. Pt has been scheduled in office 03/25/24 in office appt with Katlyn West, NP for preop clearance. Pt has been given address to new location.    Lucien, Desha, PA-C to Cv Div Preop Callback     03/05/24  2:11 PM Please arrange in office visit after lab work has been completed. Thanks!

## 2024-03-11 NOTE — Telephone Encounter (Signed)
 I have reached out to [facility] Sam M in regard to appt, see notes.

## 2024-03-23 NOTE — Progress Notes (Deleted)
 Cardiology Office Note    Date:  03/23/2024  ID:  Charles Villa, Charles Villa October 18, 1945, MRN [id number] PCP:  [name], MD  Cardiologist:  Vinie JAYSON Maxcy, MD  Electrophysiologist:  None   Chief Complaint: Preoperative cardiac evaluation for EGD and colonoscopy  History of Present Illness: .    Charles Villa is a 78 y.o. male with visit-pertinent history of CAD, hypertension, hyperlipidemia, GERD, chronic back pain, longstanding alcohol use and psoriasis.  Echocardiogram on 02/15/2019 teen showed EF 55 to 60%, no regional wall motion abnormality, grade 1 DD, trivial TR.  Exercise tolerance test obtained on 02/14/2017 showed horizontal ST segment depression noted in the inferolateral leads.  Subsequent cardiac catheterization on 02/25/2019 teen showed 90% mid left circumflex lesion treated with a 3.5 x 16 mm DES, EF 55 to 65%.  Echocardiogram in setting of dyspnea on 04/26/2022 showed EF 66 5%, no regional wall motion abnormality, G1 DD, no significant valve issue.  MPI on/26/24 was low risk, no evidence of ischemia or infarction, EF 63%.  Patient was seen in clinic on 06/10/2023 by [name], PA.  Patient was to undergo cataract surgery the day prior however surgery was delayed after anesthesia noted irregular heartbeat.  Subsequent EKG demonstrated atrial flutter with heart rate of 97 bpm.  Office visit on 06/10/2023 was noted the patient had returned to sinus rhythm.  He was started on Eliquis  5 mg twice daily.  Cardiac monitor in 06/2023 indicated sinus rhythm with IVCD, 10 runs of SVT were noted atrial fibrillation burden 6%.  Patient was last in clinic on 07/07/2023 by Dr. Maxcy.  He continued to note some progressive dyspnea.  Patient had been referred to pulmonology given dyspnea as this was felt to not be cardiac related.  Was recommended that he have 33-month follow-up.  Today he presents for preoperative cardiac evaluation for EGD and colonoscopy.  He reports that he  Preoperative  cardiac evaluation: Patient is currently pending EGD and colonoscopy on 04/08/2024 with [facility] gastroenterology. {Click Here to Calculate RCRI      :789639253}  { Click Here to Calculate DASI      :789639253} {Select to add RCRI Risk (<1%=LOW; >/=1%=HIGH) (Optional):21036017}  {Select if HIGH (RCRI >/=1%) Risk (Optional):21036030} Recommendations: {2014 ACC/AHA Perioperative Guidelines  :21036001} Antiplatelet and/or Anticoagulation Recommendations: {Antiplatelet Recommendations                  :21036016} {Anticoagulation Recommendations           :78963980}  Atrial fibrillation/flutter: Noted in 05/2023.  Cardiac monitor indicated atrial fibrillation burden of 6%. EKG today indicates    CAD: Cardiac catheterization on 02/24/2017 showed 90% mid left circumflex lesion treated with 3.5 x 16 mm DES.  MPI on 11/22/2022 was normal and low risk.  Hypertension: Blood pressure today  Hyperlipidemia: Last lipid profile on  Inferior abdominal aortic aneurysm: Measuring 3.5 cm on CT in May 2024.  Plan for follow-up study in 2026.   Labwork independently reviewed:   ROS: .   *** denies chest pain, shortness of breath, lower extremity edema, fatigue, palpitations, melena, hematuria, hemoptysis, diaphoresis, weakness, presyncope, syncope, orthopnea, and PND.  All other systems are reviewed and otherwise negative.  Studies Reviewed: SABRA    EKG:  EKG is ordered today, personally reviewed, demonstrating ***     CV Studies: Cardiac studies reviewed are outlined and summarized above. Otherwise please see EMR for full report. Cardiac Studies & Procedures   ______________________________________________________________________________________________ CARDIAC CATHETERIZATION  CARDIAC  CATHETERIZATION 02/24/2017  Conclusion Images from the original result were not included.   Mid Cx lesion, 90 %stenosed.  A STENT PROMUS PREM MR 3.5X16 drug eluting stent was successfully placed.  Post  intervention, there is a 0% residual stenosis.  _______________  Moderate disease in the ostial and mid LAD with proximal-mid ectasia  The left ventricular systolic function is normal. The left ventricular ejection fraction is 55-65% by visual estimate.  LV end diastolic pressure is normal.  Severe focal single vessel disease involving the mid circumflex just prior to large lateral OM branch. Successful PCI DES.  Plan:  Same-day discharged today on dual antiplatelet therapy for minimum one year.  Patient will likely need titration of his anti-hypertensive agents as well as cardiac risk factor modification.  He will follow with Dr. [name].   Alm Clay, M.D., M.S. Interventional Cardiologist  Pager # [phone] Phone # [phone] [address] [place], [place] [address]  Findings Coronary Findings Diagnostic  Dominance: Right  Left Main Vessel is large. Vessel is angiographically normal.  Left Anterior Descending In the proximal to midportion is a patulous/ectatic appearing after the initial ostial lesions that appears to really be the true size of the vessel. Beyond the mid 40% lesion, the vessel tapers to a relatively small caliber vessel to before it reaches the apex The lesion is focal and concentric. The lesion is focal.  First Septal Branch Vessel is small in size.  Second Diagonal Branch Vessel is small in size.  Second Septal Branch Vessel is small in size.  Third Septal Branch Vessel is small in size.  Ramus Intermedius Vessel is moderate in size. The lesion is eccentric.  Lateral Ramus Intermedius Vessel is small in size.  Left Circumflex Vessel is large. The lesion is located proximal to the major branch, focal and eccentric.  First Obtuse Marginal Branch Vessel is small in size.  Lateral Second Obtuse Marginal Branch Vessel is small in size.  Right Coronary Artery Vessel is large. Somewhat ectatic vessel The  lesion is segmental. Smooth, diffuse  Acute Marginal Branch Vessel is moderate in size.  Right Posterior Descending Artery Vessel is moderate in size. Vessel is angiographically normal. The vessel is tortuous.  Inferior Septal Vessel is small in size.  First Right Posterolateral Branch Vessel is small in size.  Second Right Posterolateral Branch Vessel is small in size.  Intervention  Mid Cx lesion Angioplasty Lesion length: 13 mm. Lesion crossed with guidewire using a WIRE ASAHI PROWATER 180CM. Pre-stent angioplasty was performed using a BALLOON SAPPHIRE 3.0X12. Maximum pressure: 12 atm. Inflation time: 20 sec. A STENT PROMUS PREM MR 3.5X16 drug eluting stent was successfully placed. Minimum lumen area: 3.9 mm. Stent strut is well apposed. Post-stent angioplasty was performed using a BALLOON [place] EUPHORA RX3.5X12. Maximum pressure: 16 atm. Inflation time: 20 sec. The pre-interventional distal flow is normal (TIMI 3).  The post-interventional distal flow is normal (TIMI 3). The intervention was successful . No complications occurred at this lesion. CATH VISTA GUIDE 6FR XBLAD3.5 (Yes) There is a 0% residual stenosis post intervention.   STRESS TESTS  MYOCARDIAL PERFUSION IMAGING 11/22/2022  Interpretation Summary   The study is normal. The study is low risk.   No ST deviation was noted.   LV perfusion is normal. There is evidence of ischemia. There is evidence of infarction.   Left ventricular function is normal. End diastolic cavity size is normal. End systolic cavity size is normal.   Prior study not available for comparison.  Normal resting and stress perfusion. No ischemia or infarction EF 63%   ECHOCARDIOGRAM  ECHOCARDIOGRAM COMPLETE 04/26/2022  Narrative ECHOCARDIOGRAM REPORT    Patient Name:   Charles Villa Date of Exam: 04/26/2022 Medical Rec #:  [id number]        Height:       71.0 in Accession #:    [id number]       Weight:       227.2 lb Date of Birth:   [birth year]        BSA:          2.226 m Patient Age:    76 years         BP:           106/66 mmHg Patient Gender: M                HR:           58 bpm. Exam Location:  [address]  Procedure: 2D Echo, Cardiac Doppler, Color Doppler and Intracardiac Opacification Agent  Indications:    r06.09 Dyspnea  History:        Patient has prior history of Echocardiogram examinations, most recent 02/18/2017. CAD; Risk Factors:Hypertension and Dyslipidemia.  Sonographer:    Carl Coma RDCS Referring Phys: [id number] ELIZABETH PECK  IMPRESSIONS   1. Left ventricular ejection fraction, by estimation, is 60 to 65%. The left ventricle has normal function. The left ventricle has no regional wall motion abnormalities. Left ventricular diastolic parameters are consistent with Grade I diastolic dysfunction (impaired relaxation). 2. Right ventricular systolic function is normal. The right ventricular size is normal. Tricuspid regurgitation signal is inadequate for assessing PA pressure. 3. The mitral valve is normal in structure. No evidence of mitral valve regurgitation. No evidence of mitral stenosis. 4. The aortic valve is grossly normal. Aortic valve regurgitation is not visualized. No aortic stenosis is present. 5. The inferior vena cava is normal in size with greater than 50% respiratory variability, suggesting right atrial pressure of 3 mmHg.  Conclusion(s)/Recommendation(s): Normal biventricular function without evidence of hemodynamically significant valvular heart disease.  FINDINGS Left Ventricle: Left ventricular ejection fraction, by estimation, is 60 to 65%. The left ventricle has normal function. The left ventricle has no regional wall motion abnormalities. Definity  contrast agent was given IV to delineate the left ventricular endocardial borders. The left ventricular internal cavity size was normal in size. There is no left ventricular hypertrophy. Left ventricular diastolic  parameters are consistent with Grade I diastolic dysfunction (impaired relaxation).  Right Ventricle: The right ventricular size is normal. No increase in right ventricular wall thickness. Right ventricular systolic function is normal. Tricuspid regurgitation signal is inadequate for assessing PA pressure.  Left Atrium: Left atrial size was normal in size.  Right Atrium: Right atrial size was normal in size.  Pericardium: There is no evidence of pericardial effusion.  Mitral Valve: The mitral valve is normal in structure. No evidence of mitral valve regurgitation. No evidence of mitral valve stenosis.  Tricuspid Valve: The tricuspid valve is normal in structure. Tricuspid valve regurgitation is trivial. No evidence of tricuspid stenosis.  Aortic Valve: The aortic valve is grossly normal. Aortic valve regurgitation is not visualized. No aortic stenosis is present.  Pulmonic Valve: The pulmonic valve was normal in structure. Pulmonic valve regurgitation is trivial. No evidence of pulmonic stenosis.  Aorta: The aortic root is normal in size and structure.  Venous: The inferior vena cava is normal in size with greater than 50% respiratory  variability, suggesting right atrial pressure of 3 mmHg.  IAS/Shunts: No atrial level shunt detected by color flow Doppler.   LEFT VENTRICLE PLAX 2D LVIDd:         5.40 cm   Diastology LVIDs:         3.70 cm   LV e' medial:    6.31 cm/s LV PW:         0.60 cm   LV E/e' medial:  10.9 LV IVS:        0.60 cm   LV e' lateral:   8.16 cm/s LVOT diam:     1.90 cm   LV E/e' lateral: 8.4 LV SV:         59 LV SV Index:   26 LVOT Area:     2.84 cm   RIGHT VENTRICLE RV Basal diam:  4.30 cm RV S prime:     12.25 cm/s TAPSE (M-mode): 2.1 cm  LEFT ATRIUM             Index        RIGHT ATRIUM           Index LA diam:        4.20 cm 1.89 cm/m   RA Area:     11.90 cm LA Vol (A2C):   38.2 ml 17.16 ml/m  RA Volume:   29.20 ml  13.12 ml/m LA Vol (A4C):    41.4 ml 18.60 ml/m LA Biplane Vol: 42.1 ml 18.91 ml/m AORTIC VALVE LVOT Vmax:   91.25 cm/s LVOT Vmean:  59.050 cm/s LVOT VTI:    0.207 m  AORTA Ao Root diam: 3.40 cm Ao Asc diam:  3.60 cm  MITRAL VALVE MV Area (PHT): 2.69 cm    SHUNTS MV Decel Time: 282 msec    Systemic VTI:  0.21 m MV E velocity: 68.70 cm/s  Systemic Diam: 1.90 cm MV A velocity: 93.80 cm/s MV E/A ratio:  0.73  Soyla Merck MD Electronically signed by Soyla Merck MD Signature Date/Time: 04/27/2022/4:45:48 PM    Final    MONITORS  LONG TERM MONITOR (3-14 DAYS) 07/07/2023  Narrative Patch Wear Time:  13 days and 23 hours (2024-11-16T15:10:52-0500 to 2024-11-30T15:10:48-0500)  Patient had a min HR of 44 bpm, max HR of 229 bpm, and avg HR of 65 bpm. Predominant underlying rhythm was Sinus Rhythm. Bundle Branch Block/IVCD was present. 10 Supraventricular Tachycardia runs occurred, the run with the fastest interval lasting 5 beats with a max rate of 126 bpm, the longest lasting 10 beats with an avg rate of 108 bpm. Atrial Fibrillation occurred (6% burden), ranging from 44-229 bpm (avg of 91 bpm), the longest lasting 4 hours 52 mins with an avg rate of 71 bpm. Atrial Fibrillation was present at de-activation of device. Isolated SVEs were occasional (4.2%, 55528), SVE Couplets were rare (<1.0%, 3946), and SVE Triplets were rare (<1.0%, 1062). Isolated VEs were rare (<1.0%), VE Couplets were rare (<1.0%), and no VE Triplets were present. MD notification criteria for Rapid Atrial Fibrillation met - report posted prior to notification per account request (DB).  Monitor shows sinus rhythm with an IVCD.  10 runs of SVT were noted.  A-fib/flutter was noted occasionally rapid.  PACs occurred with a frequency of about 4.2%.  The frequency of atrial fibrillation was about 6% during the monitoring period.  He is anticoagulated.  Vinie KYM Maxcy, MD, [facility], FACP [place]  [facility] Medical Director of the  Advanced Lipid Disorders & Cardiovascular Risk Reduction  Clinic Diplomate of the American Board of Clinical Lipidology Attending Cardiologist Direct Dial: [phone]  Fax: [phone] Website:  [email or web address]       ______________________________________________________________________________________________       Current Reported Medications:.    No outpatient medications have been marked as taking for the 03/25/24 encounter (Appointment) with Bostyn Kunkler D, NP.    Physical Exam:    VS:  There were no vitals taken for this visit.   Wt Readings from Last 3 Encounters:  03/03/24 217 lb (98.4 kg)  12/11/23 224 lb 3.2 oz (101.7 kg)  09/03/23 226 lb (102.5 kg)    GEN: Well nourished, well developed in no acute distress NECK: No JVD; No carotid bruits CARDIAC: ***RRR, no murmurs, rubs, gallops RESPIRATORY:  Clear to auscultation without rales, wheezing or rhonchi  ABDOMEN: Soft, non-tender, non-distended EXTREMITIES:  No edema; No acute deformity     Asessement and Plan:.     ***     Disposition: F/u with ***  Signed, Bee Hammerschmidt D Beckey Polkowski, NP

## 2024-03-25 ENCOUNTER — Encounter: Payer: Self-pay | Admitting: Cardiology

## 2024-03-25 ENCOUNTER — Ambulatory Visit: Admitting: Cardiology

## 2024-03-25 ENCOUNTER — Ambulatory Visit: Attending: Cardiology | Admitting: Cardiology

## 2024-03-25 VITALS — BP 106/66 | HR 73 | Ht 71.0 in | Wt 213.4 lb

## 2024-03-25 DIAGNOSIS — I7143 Infrarenal abdominal aortic aneurysm, without rupture: Secondary | ICD-10-CM

## 2024-03-25 DIAGNOSIS — R0609 Other forms of dyspnea: Secondary | ICD-10-CM | POA: Diagnosis not present

## 2024-03-25 DIAGNOSIS — I4892 Unspecified atrial flutter: Secondary | ICD-10-CM

## 2024-03-25 DIAGNOSIS — Z01818 Encounter for other preprocedural examination: Secondary | ICD-10-CM | POA: Diagnosis not present

## 2024-03-25 DIAGNOSIS — Z01810 Encounter for preprocedural cardiovascular examination: Secondary | ICD-10-CM

## 2024-03-25 DIAGNOSIS — I480 Paroxysmal atrial fibrillation: Secondary | ICD-10-CM

## 2024-03-25 DIAGNOSIS — E785 Hyperlipidemia, unspecified: Secondary | ICD-10-CM | POA: Diagnosis not present

## 2024-03-25 DIAGNOSIS — Z9861 Coronary angioplasty status: Secondary | ICD-10-CM | POA: Diagnosis not present

## 2024-03-25 DIAGNOSIS — I2510 Atherosclerotic heart disease of native coronary artery without angina pectoris: Secondary | ICD-10-CM | POA: Diagnosis not present

## 2024-03-25 NOTE — Progress Notes (Addendum)
 Cardiology Office Note    Date:  03/25/2024  ID:  Villa, Charles [birth year], MRN [id number] PCP:  [name], MD  Cardiologist:  Vinie JAYSON Maxcy, MD  Electrophysiologist:  None   Chief Complaint: Preoperative cardiac evaluation for EGD and colonoscopy  History of Present Illness: .    Charles Villa is a 78 y.o. male with visit-pertinent history of CAD, hypertension, hyperlipidemia, GERD, chronic back pain, longstanding alcohol use and psoriasis.  Echocardiogram on 02/15/2019 teen showed EF 55 to 60%, no regional wall motion abnormality, grade 1 DD, trivial TR.  Exercise tolerance test obtained on 02/14/2017 showed horizontal ST segment depression noted in the inferolateral leads.  Subsequent cardiac catheterization on 02/25/2019 teen showed 90% mid left circumflex lesion treated with a 3.5 x 16 mm DES, EF 55 to 65%.  Echocardiogram in setting of dyspnea on 04/26/2022 showed EF 66 5%, no regional wall motion abnormality, G1 DD, no significant valve issue.  MPI on/26/24 was low risk, no evidence of ischemia or infarction, EF 63%.  Patient was seen in clinic on 06/10/2023 by [name], PA.  Patient was to undergo cataract surgery the day prior however surgery was delayed after anesthesia noted irregular heartbeat.  Subsequent EKG demonstrated atrial flutter with heart rate of 97 bpm.  Office visit on 06/10/2023 was noted the patient had returned to sinus rhythm.  He was started on Eliquis  5 mg twice daily.  Cardiac monitor in 06/2023 indicated sinus rhythm with IVCD, 10 runs of SVT were noted atrial fibrillation burden 6%.  Patient was last in clinic on 07/07/2023 by Dr. Maxcy.  He continued to note some progressive dyspnea.  Patient had been referred to pulmonology given dyspnea as this was felt to not be cardiac related.  Was recommended that he have 87-month follow-up.  Today he presents for preoperative cardiac evaluation for EGD and colonoscopy.  He reports that he continues to  have increased dyspnea on exertion, reports that when he exerts himself his breathing worsens.  He also reports decreased activity tolerance, notes he feels as though his his muscles have progressively weekend in the last year.  He does feel as though his breathing has gotten worse.  He denies any chest pain, lower extremity edema, orthopnea or PND.  He denies any palpitations or feeling of being in atrial fibrillation.  He denies any bleeding problems on Eliquis . ROS: .   Today he denies chest pain, lower extremity edema, fatigue, palpitations, melena, hematuria, hemoptysis, diaphoresis, weakness, presyncope, syncope, orthopnea, and PND.  All other systems are reviewed and otherwise negative. Studies Reviewed: SABRA    EKG:  EKG is ordered today, personally reviewed, demonstrating  EKG Interpretation Date/Time:  Thursday March 25 2024 14:58:59 EDT Ventricular Rate:  71 PR Interval:  170 QRS Duration:  112 QT Interval:  416 QTC Calculation: 452 R Axis:   -8  Text Interpretation: Normal sinus rhythm Incomplete right bundle branch block When compared with ECG of 10-Jun-2023 14:56, Incomplete right bundle branch block has replaced Right bundle branch block Confirmed by Coty Larsh [phone]) on 03/25/2024 3:08:55 PM   CV Studies: Cardiac studies reviewed are outlined and summarized above. Otherwise please see EMR for full report. Cardiac Studies & Procedures   ______________________________________________________________________________________________ CARDIAC CATHETERIZATION  CARDIAC CATHETERIZATION 02/24/2017  Conclusion Images from the original result were not included.   Mid Cx lesion, 90 %stenosed.  A STENT PROMUS PREM MR 3.5X16 drug eluting stent was successfully placed.  Post intervention, there is a  0% residual stenosis.  _______________  Moderate disease in the ostial and mid LAD with proximal-mid ectasia  The left ventricular systolic function is normal. The left ventricular  ejection fraction is 55-65% by visual estimate.  LV end diastolic pressure is normal.  Severe focal single vessel disease involving the mid circumflex just prior to large lateral OM branch. Successful PCI DES.  Plan:  Same-day discharged today on dual antiplatelet therapy for minimum one year.  Patient will likely need titration of his anti-hypertensive agents as well as cardiac risk factor modification.  He will follow with Dr. [name].   Alm Clay, M.D., M.S. Interventional Cardiologist  Pager # [phone] Phone # [phone] [address] [place], [place] [address]  Findings Coronary Findings Diagnostic  Dominance: Right  Left Main Vessel is large. Vessel is angiographically normal.  Left Anterior Descending In the proximal to midportion is a patulous/ectatic appearing after the initial ostial lesions that appears to really be the true size of the vessel. Beyond the mid 40% lesion, the vessel tapers to a relatively small caliber vessel to before it reaches the apex The lesion is focal and concentric. The lesion is focal.  First Septal Branch Vessel is small in size.  Second Diagonal Branch Vessel is small in size.  Second Septal Branch Vessel is small in size.  Third Septal Branch Vessel is small in size.  Ramus Intermedius Vessel is moderate in size. The lesion is eccentric.  Lateral Ramus Intermedius Vessel is small in size.  Left Circumflex Vessel is large. The lesion is located proximal to the major branch, focal and eccentric.  First Obtuse Marginal Branch Vessel is small in size.  Lateral Second Obtuse Marginal Branch Vessel is small in size.  Right Coronary Artery Vessel is large. Somewhat ectatic vessel The lesion is segmental. Smooth, diffuse  Acute Marginal Branch Vessel is moderate in size.  Right Posterior Descending Artery Vessel is moderate in size. Vessel is angiographically normal. The vessel is  tortuous.  Inferior Septal Vessel is small in size.  First Right Posterolateral Branch Vessel is small in size.  Second Right Posterolateral Branch Vessel is small in size.  Intervention  Mid Cx lesion Angioplasty Lesion length: 13 mm. Lesion crossed with guidewire using a WIRE ASAHI PROWATER 180CM. Pre-stent angioplasty was performed using a BALLOON SAPPHIRE 3.0X12. Maximum pressure: 12 atm. Inflation time: 20 sec. A STENT PROMUS PREM MR 3.5X16 drug eluting stent was successfully placed. Minimum lumen area: 3.9 mm. Stent strut is well apposed. Post-stent angioplasty was performed using a BALLOON Udell EUPHORA RX3.5X12. Maximum pressure: 16 atm. Inflation time: 20 sec. The pre-interventional distal flow is normal (TIMI 3).  The post-interventional distal flow is normal (TIMI 3). The intervention was successful . No complications occurred at this lesion. CATH VISTA GUIDE 6FR XBLAD3.5 (Yes) There is a 0% residual stenosis post intervention.   STRESS TESTS  MYOCARDIAL PERFUSION IMAGING 11/22/2022  Interpretation Summary   The study is normal. The study is low risk.   No ST deviation was noted.   LV perfusion is normal. There is evidence of ischemia. There is evidence of infarction.   Left ventricular function is normal. End diastolic cavity size is normal. End systolic cavity size is normal.   Prior study not available for comparison.  Normal resting and stress perfusion. No ischemia or infarction EF 63%   ECHOCARDIOGRAM  ECHOCARDIOGRAM COMPLETE 04/26/2022  Narrative ECHOCARDIOGRAM REPORT    Patient Name:   TERION HEDMAN Date of Exam: 04/26/2022 Medical  Rec #:  [id number]        Height:       71.0 in Accession #:    [id number]       Weight:       227.2 lb Date of Birth:  [birth year]        BSA:          2.226 m Patient Age:    76 years         BP:           106/66 mmHg Patient Gender: M                HR:           58 bpm. Exam Location:  [address]  Procedure: 2D Echo,  Cardiac Doppler, Color Doppler and Intracardiac Opacification Agent  Indications:    r06.09 Dyspnea  History:        Patient has prior history of Echocardiogram examinations, most recent 02/18/2017. CAD; Risk Factors:Hypertension and Dyslipidemia.  Sonographer:    Carl Coma RDCS Referring Phys: [id number] ELIZABETH PECK  IMPRESSIONS   1. Left ventricular ejection fraction, by estimation, is 60 to 65%. The left ventricle has normal function. The left ventricle has no regional wall motion abnormalities. Left ventricular diastolic parameters are consistent with Grade I diastolic dysfunction (impaired relaxation). 2. Right ventricular systolic function is normal. The right ventricular size is normal. Tricuspid regurgitation signal is inadequate for assessing PA pressure. 3. The mitral valve is normal in structure. No evidence of mitral valve regurgitation. No evidence of mitral stenosis. 4. The aortic valve is grossly normal. Aortic valve regurgitation is not visualized. No aortic stenosis is present. 5. The inferior vena cava is normal in size with greater than 50% respiratory variability, suggesting right atrial pressure of 3 mmHg.  Conclusion(s)/Recommendation(s): Normal biventricular function without evidence of hemodynamically significant valvular heart disease.  FINDINGS Left Ventricle: Left ventricular ejection fraction, by estimation, is 60 to 65%. The left ventricle has normal function. The left ventricle has no regional wall motion abnormalities. Definity  contrast agent was given IV to delineate the left ventricular endocardial borders. The left ventricular internal cavity size was normal in size. There is no left ventricular hypertrophy. Left ventricular diastolic parameters are consistent with Grade I diastolic dysfunction (impaired relaxation).  Right Ventricle: The right ventricular size is normal. No increase in right ventricular wall thickness. Right ventricular  systolic function is normal. Tricuspid regurgitation signal is inadequate for assessing PA pressure.  Left Atrium: Left atrial size was normal in size.  Right Atrium: Right atrial size was normal in size.  Pericardium: There is no evidence of pericardial effusion.  Mitral Valve: The mitral valve is normal in structure. No evidence of mitral valve regurgitation. No evidence of mitral valve stenosis.  Tricuspid Valve: The tricuspid valve is normal in structure. Tricuspid valve regurgitation is trivial. No evidence of tricuspid stenosis.  Aortic Valve: The aortic valve is grossly normal. Aortic valve regurgitation is not visualized. No aortic stenosis is present.  Pulmonic Valve: The pulmonic valve was normal in structure. Pulmonic valve regurgitation is trivial. No evidence of pulmonic stenosis.  Aorta: The aortic root is normal in size and structure.  Venous: The inferior vena cava is normal in size with greater than 50% respiratory variability, suggesting right atrial pressure of 3 mmHg.  IAS/Shunts: No atrial level shunt detected by color flow Doppler.   LEFT VENTRICLE PLAX 2D LVIDd:         5.40 cm  Diastology LVIDs:         3.70 cm   LV e' medial:    6.31 cm/s LV PW:         0.60 cm   LV E/e' medial:  10.9 LV IVS:        0.60 cm   LV e' lateral:   8.16 cm/s LVOT diam:     1.90 cm   LV E/e' lateral: 8.4 LV SV:         59 LV SV Index:   26 LVOT Area:     2.84 cm   RIGHT VENTRICLE RV Basal diam:  4.30 cm RV S prime:     12.25 cm/s TAPSE (M-mode): 2.1 cm  LEFT ATRIUM             Index        RIGHT ATRIUM           Index LA diam:        4.20 cm 1.89 cm/m   RA Area:     11.90 cm LA Vol (A2C):   38.2 ml 17.16 ml/m  RA Volume:   29.20 ml  13.12 ml/m LA Vol (A4C):   41.4 ml 18.60 ml/m LA Biplane Vol: 42.1 ml 18.91 ml/m AORTIC VALVE LVOT Vmax:   91.25 cm/s LVOT Vmean:  59.050 cm/s LVOT VTI:    0.207 m  AORTA Ao Root diam: 3.40 cm Ao Asc diam:  3.60 cm  MITRAL  VALVE MV Area (PHT): 2.69 cm    SHUNTS MV Decel Time: 282 msec    Systemic VTI:  0.21 m MV E velocity: 68.70 cm/s  Systemic Diam: 1.90 cm MV A velocity: 93.80 cm/s MV E/A ratio:  0.73  Soyla Merck MD Electronically signed by Soyla Merck MD Signature Date/Time: 04/27/2022/4:45:48 PM    Final    MONITORS  LONG TERM MONITOR (3-14 DAYS) 07/07/2023  Narrative Patch Wear Time:  13 days and 23 hours (2024-11-16T15:10:52-0500 to 2024-11-30T15:10:48-0500)  Patient had a min HR of 44 bpm, max HR of 229 bpm, and avg HR of 65 bpm. Predominant underlying rhythm was Sinus Rhythm. Bundle Branch Block/IVCD was present. 10 Supraventricular Tachycardia runs occurred, the run with the fastest interval lasting 5 beats with a max rate of 126 bpm, the longest lasting 10 beats with an avg rate of 108 bpm. Atrial Fibrillation occurred (6% burden), ranging from 44-229 bpm (avg of 91 bpm), the longest lasting 4 hours 52 mins with an avg rate of 71 bpm. Atrial Fibrillation was present at de-activation of device. Isolated SVEs were occasional (4.2%, 55528), SVE Couplets were rare (<1.0%, 3946), and SVE Triplets were rare (<1.0%, 1062). Isolated VEs were rare (<1.0%), VE Couplets were rare (<1.0%), and no VE Triplets were present. MD notification criteria for Rapid Atrial Fibrillation met - report posted prior to notification per account request (DB).  Monitor shows sinus rhythm with an IVCD.  10 runs of SVT were noted.  A-fib/flutter was noted occasionally rapid.  PACs occurred with a frequency of about 4.2%.  The frequency of atrial fibrillation was about 6% during the monitoring period.  He is anticoagulated.  Vinie KYM Maxcy, MD, [facility], FACP [place]  [facility] Medical Director of the [facility] Lipid [facility] Cardiovascular Risk Reduction Clinic Diplomate of the American Board of Clinical Lipidology Attending Cardiologist Direct Dial: [phone]  Fax: [phone] Website:   [email or web address]       ______________________________________________________________________________________________       Current Reported Medications:.  Current Meds  Medication Sig   albuterol  (VENTOLIN  HFA) 108 (90 Base) MCG/ACT inhaler Inhale 2 puffs into the lungs every 6 (six) hours as needed for wheezing or shortness of breath.   apixaban  (ELIQUIS ) 5 MG TABS tablet Take 1 tablet (5 mg total) by mouth 2 (two) times daily.   clobetasol (TEMOVATE) 0.05 % external solution Apply 1 application topically at bedtime as needed (scalp).    clobetasol cream (TEMOVATE) 0.05 % Apply 1 application topically 2 (two) times daily as needed (abdomen).    clotrimazole -betamethasone  (LOTRISONE ) cream Apply 1 application topically 2 (two) times daily as needed. psoriass   desonide  (DESONATE ) 0.05 % gel Apply 1 application  topically 2 (two) times daily as needed. Skin lesions   fenofibrate  160 MG tablet Take 1 tablet (160 mg total) by mouth daily.   fluocinolone (VANOS) 0.01 % cream Apply 1 application topically daily as needed.    hydrocortisone  (ANUSOL -HC) 2.5 % rectal cream Place 1 application rectally 2 (two) times daily as needed for hemorrhoids or anal itching.   pantoprazole  (PROTONIX ) 40 MG tablet TAKE 1 TABLET BY MOUTH EVERY DAY (Patient taking differently: Take 40 mg by mouth daily. Two to three times per week)   polyethylene glycol (MIRALAX / GLYCOLAX) 17 g packet Take 17 g by mouth as needed.   rosuvastatin  (CRESTOR ) 40 MG tablet TAKE 1 TABLET BY MOUTH EVERY DAY   VASCEPA  1 g capsule TAKE 2 CAPSULES BY MOUTH TWO TIMES A DAY.    Physical Exam:    VS:  BP 106/66   Pulse 73   Ht 5' 11 (1.803 m)   Wt 213 lb 6.4 oz (96.8 kg)   SpO2 93%   BMI 29.76 kg/m    Wt Readings from Last 3 Encounters:  03/25/24 213 lb 6.4 oz (96.8 kg)  03/03/24 217 lb (98.4 kg)  12/11/23 224 lb 3.2 oz (101.7 kg)    GEN: Well nourished, well developed in no acute distress NECK: No JVD; No carotid  bruits CARDIAC: RRR, no murmurs, rubs, gallops RESPIRATORY:  Clear to auscultation without rales, wheezing or rhonchi  ABDOMEN: Soft, non-tender, non-distended EXTREMITIES:  No edema; No acute deformity     Asessement and Plan:.    Preoperative cardiac evaluation: Patient is currently pending EGD and colonoscopy on 04/08/2024 with [facility] gastroenterology.  Patient reports that his dyspnea on exertion has worsened in the last few months, we have planned for him to undergo a nuclear stress test for ischemic evaluation.  Preoperative cardiac evaluation to be completed pending results of stress testing. On chart review he was also following with pulmonology earlier in the year for dyspnea and is overdue for follow-up, also recommend requesting pulmonology clearance given worsened dyspnea. ADDENDUM 04/05/24: Patient's nuclear stress test on 04/02/2024 was normal with no evidence of ischemia or infarction.  Patient can proceed with EGD and colonoscopy at appropriate cardiac risk.  Patient has been followed by pulmonology as well for dyspnea, recommend requesting pulmonology clearance as well as noted above. Per office protocol, patient can hold Eliquis  for 2 days prior to procedure.  Please resume as soon as safe to do so from a bleeding standpoint.  Atrial fibrillation/flutter: Noted in 05/2023.  Cardiac monitor indicated atrial fibrillation burden of 6%. EKG today indicates normal sinus rhythm with incomplete right bundle branch block.  Patient denies any increased palpitations or feeling of atrial flutter or fibrillation.  Patient denies any bleeding problems on Eliquis . Check CBC and BMET today.  Continue Eliquis  5  mg twice daily.  CAD: Cardiac catheterization on 02/24/2017 showed 90% mid left circumflex lesion treated with 3.5 x 16 mm DES.  MPI on 11/22/2022 was normal and low risk.  Patient continues to note dyspnea on exertion, he feels this has actually worsened in the last few months.  He denies chest  pain.  Was seen in office today for preoperative cardiac evaluation, given worsening symptoms patient agreeable to undergo nuclear stress test, also recommended following up with pulmonology.  Reviewed ED precautions.  Patient is not on aspirin  given need for Eliquis , continue Crestor  and Vascepa . Informed Consent   Shared Decision Making/Informed Consent The risks [chest pain, shortness of breath, cardiac arrhythmias, dizziness, blood pressure fluctuations, myocardial infarction, stroke/transient ischemic attack, nausea, vomiting, allergic reaction, radiation exposure, metallic taste sensation and life-threatening complications (estimated to be 1 in 10,000)], benefits (risk stratification, diagnosing coronary artery disease, treatment guidance) and alternatives of a nuclear stress test were discussed in detail with Mr. Polidori and he agrees to proceed.     Hypotension: Blood pressure today 106/66.  Patient with intermittent history of low blood pressures, improved today in office.  He denies any significant dizziness, lightheadedness, presyncope or syncope.  Hyperlipidemia: Patient without recent fasting lipid profile.  He is agreeable to return to the office in the next 2-week for fasting lipid profile and LFTs.  Inferior abdominal aortic aneurysm: Measuring 3.5 cm on CT in May 2024.  Plan for follow-up study in 2026.   Disposition: F/u with Ritchard Paragas, NP in 6-8 weeks.   Signed, Dvora Buitron D Chalmer Zheng, NP

## 2024-03-25 NOTE — Patient Instructions (Signed)
 Medication Instructions:  No changes *If you need a refill on your cardiac medications before your next appointment, please call your pharmacy*  Lab Work: Stop on the first floor for labs In a few weeks we are going to need a fasting lipid panel and lft's If you have labs (blood work) drawn today and your tests are completely normal, you will receive your results only by: MyChart Message (if you have MyChart) OR A paper copy in the mail If you have any lab test that is abnormal or we need to change your treatment, we will call you to review the results.  Testing/Procedures: Katlyn west NP has ordered a Lexiscan  Myocardial Perfusion Imaging Study.  Please arrive 15 minutes prior to your appointment time for registration and insurance purposes.   The test will take approximately 3 to 4 hours to complete; you may bring reading material.  If someone comes with you to your appointment, they will need to remain in the main lobby due to limited space in the testing area.    How to prepare for your Myocardial Perfusion Test: Do not eat or drink 3 hours prior to your test, except you may have water. Do not consume products containing caffeine (regular or decaffeinated) 12 hours prior to your test. (ex: coffee, chocolate, sodas, tea). Do wear comfortable clothes (no dresses or overalls) and walking shoes, tennis shoes preferred (No heels or open toe shoes are allowed). Do NOT wear cologne, perfume, aftershave, or lotions (deodorant is allowed). If you use an inhaler, use it the AM of your test and bring it with you.  If you use a nebulizer, use it the AM of your test.  If these instructions are not followed, your test will have to be rescheduled.  Follow-Up: At [facility], you and your health needs are our priority.  As part of our continuing mission to provide you with exceptional heart care, our providers are all part of one team.  This team includes your primary Cardiologist  (physician) and Advanced Practice Providers or APPs (Physician Assistants and Nurse Practitioners) who all work together to provide you with the care you need, when you need it.  Your next appointment:   6-8 week(s)  Provider:   Katlyn West, NP

## 2024-03-26 ENCOUNTER — Encounter (HOSPITAL_COMMUNITY): Payer: Self-pay | Admitting: *Deleted

## 2024-03-26 LAB — BASIC METABOLIC PANEL WITH GFR
BUN/Creatinine Ratio: 8 — ABNORMAL LOW (ref 10–24)
BUN: 14 mg/dL (ref 8–27)
CO2: 16 mmol/L — ABNORMAL LOW (ref 20–29)
Calcium: 9.7 mg/dL (ref 8.6–10.2)
Chloride: 104 mmol/L (ref 96–106)
Creatinine, Ser: 1.72 mg/dL — ABNORMAL HIGH (ref 0.76–1.27)
Glucose: 104 mg/dL — ABNORMAL HIGH (ref 70–99)
Potassium: 4.1 mmol/L (ref 3.5–5.2)
Sodium: 138 mmol/L (ref 134–144)
eGFR: 41 mL/min/1.73 — ABNORMAL LOW (ref >59)

## 2024-03-26 LAB — CBC
Hematocrit: 47.5 % (ref 37.5–51.0)
Hemoglobin: 16 g/dL (ref 13.0–17.7)
MCH: 35.4 pg — ABNORMAL HIGH (ref 26.6–33.0)
MCHC: 33.7 g/dL (ref 31.5–35.7)
MCV: 105 fL — ABNORMAL HIGH (ref 79–97)
Platelets: 166 x10E3/uL (ref 150–450)
RBC: 4.52 x10E6/uL (ref 4.14–5.80)
RDW: 12.8 % (ref 11.6–15.4)
WBC: 6.1 x10E3/uL (ref 3.4–10.8)

## 2024-03-31 ENCOUNTER — Other Ambulatory Visit: Payer: Self-pay | Admitting: Cardiology

## 2024-03-31 ENCOUNTER — Encounter: Payer: Self-pay | Admitting: Cardiology

## 2024-03-31 DIAGNOSIS — R0609 Other forms of dyspnea: Secondary | ICD-10-CM

## 2024-03-31 DIAGNOSIS — Z01818 Encounter for other preprocedural examination: Secondary | ICD-10-CM

## 2024-04-01 LAB — LIPID PANEL

## 2024-04-02 ENCOUNTER — Ambulatory Visit (HOSPITAL_COMMUNITY)
Admission: RE | Admit: 2024-04-02 | Discharge: 2024-04-02 | Disposition: A | Discharge status: Home / Self Care | Source: Ambulatory Visit | Attending: Cardiology | Admitting: Cardiology

## 2024-04-02 ENCOUNTER — Ambulatory Visit: Payer: Self-pay | Admitting: Cardiology

## 2024-04-02 DIAGNOSIS — R0609 Other forms of dyspnea: Secondary | ICD-10-CM | POA: Diagnosis not present

## 2024-04-02 DIAGNOSIS — Z01818 Encounter for other preprocedural examination: Secondary | ICD-10-CM | POA: Diagnosis not present

## 2024-04-02 LAB — HEPATIC FUNCTION PANEL
ALT: 31 IU/L (ref 0–44)
AST: 54 IU/L — ABNORMAL HIGH (ref 0–40)
Albumin: 3.9 g/dL (ref 3.8–4.8)
Alkaline Phosphatase: 48 IU/L (ref 44–121)
Bilirubin Total: 1 mg/dL (ref 0.0–1.2)
Bilirubin, Direct: 0.38 mg/dL (ref 0.00–0.40)
Total Protein: 7 g/dL (ref 6.0–8.5)

## 2024-04-02 LAB — MYOCARDIAL PERFUSION IMAGING
Base ST Depression (mm): 0 mm
LV dias vol: 109 mL (ref 62–150)
LV sys vol: 33 mL (ref 4.2–5.8)
Nuc Stress EF: 70 %
Peak HR: 87 {beats}/min
Rest HR: 57 {beats}/min
Rest Nuclear Isotope Dose: 10.4 mCi
SDS: 0
SRS: 0
SSS: 0
ST Depression (mm): 0 mm
Stress Nuclear Isotope Dose: 31.6 mCi
TID: 0.97

## 2024-04-02 LAB — LIPID PANEL
Cholesterol, Total: 121 mg/dL (ref 100–199)
HDL: 44 mg/dL (ref >39)
LDL CALC COMMENT:: 2.8 ratio (ref 0.0–5.0)
LDL Chol Calc (NIH): 60 mg/dL (ref 0–99)
Triglycerides: 86 mg/dL (ref 0–149)
VLDL Cholesterol Cal: 17 mg/dL (ref 5–40)

## 2024-04-02 MED ORDER — REGADENOSON 0.4 MG/5ML IV SOLN
INTRAVENOUS | Status: AC
  Filled 2024-04-02: qty 5

## 2024-04-02 MED ORDER — TECHNETIUM TC 99M TETROFOSMIN IV KIT
10.4000 | PACK | Freq: Once | INTRAVENOUS | Status: AC | PRN
  Administered 2024-04-02: 10.4 via INTRAVENOUS

## 2024-04-02 MED ORDER — REGADENOSON 0.4 MG/5ML IV SOLN
0.4000 mg | Freq: Once | INTRAVENOUS | Status: AC
  Administered 2024-04-02: 0.4 mg via INTRAVENOUS

## 2024-04-02 MED ORDER — TECHNETIUM TC 99M TETROFOSMIN IV KIT
31.6000 | PACK | Freq: Once | INTRAVENOUS | Status: AC | PRN
  Administered 2024-04-02: 31.6 via INTRAVENOUS

## 2024-04-02 NOTE — Telephone Encounter (Signed)
 Having cardiac stress test today (04/02/2024) will comment when results are available.

## 2024-04-02 NOTE — Telephone Encounter (Signed)
Thank you Carol!!

## 2024-04-02 NOTE — Telephone Encounter (Signed)
 I will update the requesting office the pt has stress test today. Once the pt has been cleared our office will send clearance notes.

## 2024-04-02 NOTE — Telephone Encounter (Signed)
 Good morning,   This patient has a procedure on 9/11. Is there any update on his clearance?   Please advise. Thank you.

## 2024-04-05 ENCOUNTER — Ambulatory Visit (HOSPITAL_COMMUNITY): Attending: Pulmonary Disease

## 2024-04-05 ENCOUNTER — Telehealth: Payer: Self-pay

## 2024-04-05 DIAGNOSIS — I2510 Atherosclerotic heart disease of native coronary artery without angina pectoris: Secondary | ICD-10-CM | POA: Insufficient documentation

## 2024-04-05 DIAGNOSIS — R0602 Shortness of breath: Secondary | ICD-10-CM | POA: Diagnosis not present

## 2024-04-05 DIAGNOSIS — I10 Essential (primary) hypertension: Secondary | ICD-10-CM | POA: Insufficient documentation

## 2024-04-05 NOTE — Telephone Encounter (Signed)
 Patient with diagnosis of afib on Eliquis  for anticoagulation.    Procedure: colonoscopy Date of procedure: 04/08/24   CHA2DS2-VASc Score = 4   This indicates a 4.8% annual risk of stroke. The patient's score is based upon: CHF History: 0 HTN History: 1 Diabetes History: 0 Stroke History: 0 Vascular Disease History: 1 Age Score: 2 Gender Score: 0      CrCl 43 ml/min Platelet count 166  Patient has not had an Afib/aflutter ablation or Watchman within the last 3 months or DCCV within the last 30 days   Per office protocol, patient can hold Eliquis  for 2 days prior to procedure.    **This guidance is not considered finalized until pre-operative APP has relayed final recommendations.**

## 2024-04-05 NOTE — Telephone Encounter (Signed)
 Pt informed to hold Eliquis  and states understanding. He has no questions at this time.

## 2024-04-05 NOTE — Telephone Encounter (Signed)
 Letter faxed to pulmonology per cardiology recommendation, as urgent since ECL is scheduled for 04/08/24.

## 2024-04-05 NOTE — Telephone Encounter (Signed)
 Fax received from Dr. Legrand with [facility] to perform a EGD/Colon procedure on patient.  Patient needs surgery clearance. Surgery is 04/08/24. Patient was seen on 09/03/23 and according to note faxed by GI pt is having increased SOB. Office protocol is a risk assessment can be sent to surgeon if patient has been seen in 60 days or less.   I have scheduled this pt with Candis for tomorrow to provide risk assessment for this procedure. He is aware of [facility] location. Routing to clearance pool until visit has been completed and will then fax note to GI.

## 2024-04-05 NOTE — Telephone Encounter (Signed)
     Primary Cardiologist: Vinie JAYSON Maxcy, MD  Chart reviewed as part of pre-operative protocol coverage. Given past medical history and time since last visit, based on ACC/AHA guidelines, Charles Villa would be at acceptable risk for the planned procedure without further cardiovascular testing.   Patient has not had an Afib/aflutter ablation or Watchman within the last 3 months or DCCV within the last 30 days    Per office protocol, patient can hold Eliquis  for 2 days prior to procedure.      I will route this recommendation to the requesting party via Epic fax function and remove from pre-op  pool.  Please call with questions.  [name]. Adonay Scheier NP-C     04/05/2024, 3:32 PM [facility] [address] [place], [place] [address] Office [phone]

## 2024-04-06 ENCOUNTER — Ambulatory Visit (HOSPITAL_BASED_OUTPATIENT_CLINIC_OR_DEPARTMENT_OTHER)

## 2024-04-06 ENCOUNTER — Encounter (HOSPITAL_BASED_OUTPATIENT_CLINIC_OR_DEPARTMENT_OTHER): Payer: Self-pay

## 2024-04-06 VITALS — BP 104/61 | HR 71 | Ht 71.0 in | Wt 218.0 lb

## 2024-04-06 DIAGNOSIS — R0602 Shortness of breath: Secondary | ICD-10-CM

## 2024-04-06 DIAGNOSIS — R918 Other nonspecific abnormal finding of lung field: Secondary | ICD-10-CM

## 2024-04-06 DIAGNOSIS — R9389 Abnormal findings on diagnostic imaging of other specified body structures: Secondary | ICD-10-CM

## 2024-04-06 NOTE — Telephone Encounter (Signed)
 Copy os risk assessment with Candis 04/06/24 was faxed to [facility].

## 2024-04-06 NOTE — Progress Notes (Signed)
 @Patient  ID: Charles Villa, male    DOB: 1945-11-25, 78 y.o.   MRN: [id number]  Chief Complaint  Patient presents with   surgical clearance    Referring provider: [name], MD  HPI: Charles Villa is a 78 y/o male with PMH of HTN, CAD s/p PCI, and GERD who presents today for preoperative clearance for colonoscopy to be completed on 04/08/2024.  Last seen in office on 09/03/2023 for complaint of shortness of breath.  He has undergone workup for this including pulmonary function testing which was read as normal indicating no issues with lung function.  Today he reports no recent lung infections.  He continues to struggle with some shortness of breath with exertion.  He recently underwent a myocardial perfusion scan on 04/02/2024 which was read as no inducible ischemia.  He also completed a cardiopulmonary exercise test on 04/05/2024.  Results of that are pending.  He denies chest pain, fever, chills, night sweats, weight loss, headaches, abdominal pain.  TEST/EVENTS : Myocardial perfusion scan reported as no inducible ischemia Cardiopulmonary exercise test completed on 04/05/2024 but results are pending  No Known Allergies  Immunization History  Administered Date(s) Administered   DTP 08/10/2007   Fluad Quad(high Dose 65+) 05/06/2019   INFLUENZA, HIGH DOSE SEASONAL PF 05/12/2013, 05/06/2016, 05/05/2018   Influenza Split 06/08/2012   Influenza Whole 08/21/2009   Influenza, Seasonal, Injecte, Preservative Fre 05/23/2013   Influenza,inj,Quad PF,6+ Mos 07/12/2020   Influenza-Unspecified 05/01/2015, 06/29/2023   PFIZER Comirnaty(Gray Top)Covid-19 Tri-Sucrose Vaccine 11/30/2020   PFIZER(Purple Top)SARS-COV-2 Vaccination 09/03/2019, 09/24/2019   Pfizer Covid-19 Vaccine Bivalent Booster 13yrs & up 08/23/2021   Pneumococcal Conjugate-13 08/30/2013   Pneumococcal Polysaccharide-23 03/11/2014   Respiratory Syncytial Virus Vaccine,Recomb Aduvanted(Arexvy) 07/01/2022   Td 07/29/1998,  08/15/2008   Tdap 10/07/2016    Past Medical History:  Diagnosis Date   Adenomatous colon polyp 11/02/2019   Anal fissure 08/17/2012   Really more gluteal crease irritation- protosol hc 2.5% prn    BACK PAIN, CHRONIC 02/10/2007   CAD (coronary artery disease), native coronary artery    02/24/17-PCI/DES to mLcx, normal EF.   Diverticulosis of colon    2007   GERD 02/23/2007   GERD (gastroesophageal reflux disease)    Hyperlipidemia    Hypertension    OCD (obsessive compulsive disorder) 01/23/2016   Ocd/hoarding issues- psychiatristis- Cottle started on zoloft 100mg --> paxil    Personal history of colonic polyps 03/14/2014   08/2005. Benign. 10 year repeat. 2010 colonoscopy in record was entered erroneously.     PSORIASIS 02/10/2007   Topical therapy.      Scalp psoriasis    SQUAMOUS CELL CARCINOMA OF SKIN SITE UNSPECIFIED 08/24/2010   Annotation: face     Tobacco History: Social History   Tobacco Use  Smoking Status Never   Passive exposure: Past  Smokeless Tobacco Never   Counseling given: Not Answered   Outpatient Medications Prior to Visit  Medication Sig Dispense Refill   albuterol  (VENTOLIN  HFA) 108 (90 Base) MCG/ACT inhaler Inhale 2 puffs into the lungs every 6 (six) hours as needed for wheezing or shortness of breath. 8 g 6   apixaban  (ELIQUIS ) 5 MG TABS tablet Take 1 tablet (5 mg total) by mouth 2 (two) times daily. 60 tablet 11   clobetasol (TEMOVATE) 0.05 % external solution Apply 1 application topically at bedtime as needed (scalp).   5   clobetasol cream (TEMOVATE) 0.05 % Apply 1 application topically 2 (two) times daily as needed (abdomen).  3   clotrimazole -betamethasone  (LOTRISONE ) cream Apply 1 application topically 2 (two) times daily as needed. psoriass     desonide  (DESONATE ) 0.05 % gel Apply 1 application  topically 2 (two) times daily as needed. Skin lesions     fenofibrate  160 MG tablet Take 1 tablet (160 mg total) by mouth daily. 90 tablet 3   fluocinolone  (VANOS) 0.01 % cream Apply 1 application topically daily as needed.      hydrocortisone  (ANUSOL -HC) 2.5 % rectal cream Place 1 application rectally 2 (two) times daily as needed for hemorrhoids or anal itching.     pantoprazole  (PROTONIX ) 40 MG tablet TAKE 1 TABLET BY MOUTH EVERY DAY (Patient taking differently: Take 40 mg by mouth daily. Two to three times per week) 90 tablet 1   polyethylene glycol (MIRALAX / GLYCOLAX) 17 g packet Take 17 g by mouth as needed.     rosuvastatin  (CRESTOR ) 40 MG tablet TAKE 1 TABLET BY MOUTH EVERY DAY 90 tablet 1   silver sulfADIAZINE (SILVADENE) 1 % cream Apply 1 application topically 2 (two) times daily as needed (groin).   5   VASCEPA  1 g capsule TAKE 2 CAPSULES BY MOUTH TWO TIMES A DAY. 120 capsule 5   amoxicillin (AMOXIL) 500 MG capsule Take 500 mg by mouth 3 (three) times daily. (Patient not taking: Reported on 04/06/2024)     No facility-administered medications prior to visit.     Review of Systems: As per HPI  Constitutional:   No  weight loss, night sweats,  Fevers, chills, fatigue, or  lassitude.  HEENT:   No headaches,  Difficulty swallowing,  Tooth/dental problems, or  Sore throat,                No sneezing, itching, ear ache, nasal congestion, post nasal drip,   CV:  No chest pain,  Orthopnea, PND, swelling in lower extremities, anasarca, dizziness, palpitations, syncope.   GI  No heartburn, indigestion, abdominal pain, nausea, vomiting, diarrhea, change in bowel habits, loss of appetite, bloody stools.   Resp: No shortness of breath with exertion or at rest.  No excess mucus, no productive cough,  No non-productive cough,  No coughing up of blood.  No change in color of mucus.  No wheezing.  No chest wall deformity  Skin: no rash or lesions.  GU: no dysuria, change in color of urine, no urgency or frequency.  No flank pain, no hematuria   MS:  No joint pain or swelling.  No decreased range of motion.  No back pain.    Physical  Exam  BP 104/61   Pulse 71   Ht 5' 11 (1.803 m)   Wt 218 lb (98.9 kg)   SpO2 95%   BMI 30.40 kg/m   GEN: A/Ox3; pleasant , NAD, well nourished    HEENT:  [place]/AT,  EACs-clear, TMs-wnl, NOSE-clear, THROAT-clear, no lesions, no postnasal drip or exudate noted.   NECK:  Supple w/ fair ROM; no JVD; normal carotid impulses w/o bruits; no thyromegaly or nodules palpated; no lymphadenopathy.    RESP  Clear  P & A; w/o, wheezes/ rales/ or rhonchi. no accessory muscle use, no dullness to percussion  CARD:  RRR, no m/r/g, no peripheral edema, pulses intact, no cyanosis or clubbing.  GI:   Soft & nt; nml bowel sounds; no organomegaly or masses detected.   Musco: Warm bil, no deformities or joint swelling noted.   Neuro: alert, no focal deficits noted.    Skin: Warm,  dry, no lesions or rashes    Lab Results:  CBC    Component Value Date/Time   WBC 6.1 03/25/2024 1601   WBC 8.4 01/30/2023 2005   RBC 4.52 03/25/2024 1601   RBC 4.30 01/30/2023 2005   HGB 16.0 03/25/2024 1601   HCT 47.5 03/25/2024 1601   PLT 166 03/25/2024 1601   MCV 105 (H) 03/25/2024 1601   MCH 35.4 (H) 03/25/2024 1601   MCH 34.7 (H) 01/30/2023 2005   MCHC 33.7 03/25/2024 1601   MCHC 34.2 01/30/2023 2005   RDW 12.8 03/25/2024 1601   LYMPHSABS 2.1 10/21/2022 1347   MONOABS 0.5 10/21/2022 1347   EOSABS 0.1 10/21/2022 1347   BASOSABS 0.0 10/21/2022 1347    BMET    Component Value Date/Time   NA 138 03/25/2024 1601   K 4.1 03/25/2024 1601   CL 104 03/25/2024 1601   CO2 16 (L) 03/25/2024 1601   GLUCOSE 104 (H) 03/25/2024 1601   GLUCOSE 103 (H) 01/30/2023 2005   BUN 14 03/25/2024 1601   CREATININE 1.72 (H) 03/25/2024 1601   CREATININE 1.03 06/17/2016 1011   CALCIUM  9.7 03/25/2024 1601   GFRNONAA 47 (L) 01/30/2023 2005   GFRAA 77 03/03/2017 1217    BNP No results found for: BNP  ProBNP    Component Value Date/Time   PROBNP 96.0 12/30/2016 1049    Imaging: MYOCARDIAL PERFUSION  IMAGING Result Date: 04/02/2024   A pharmacological stress test was performed using IV Lexiscan  0.4mg  over 10 seconds performed without concurrent submaximal exercise. The patient reported dyspnea during the stress test. Normal blood pressure and normal heart rate response noted during stress. Heart rate recovery was normal.   No ST deviation was noted. ECG was interpretable and without significant changes. The ECG was not diagnostic due to pharmacologic protocol.   LV perfusion is normal. There is no evidence of ischemia. There is no evidence of infarction.   Left ventricular function is normal. Nuclear stress EF: 70%. End diastolic cavity size is normal. End systolic cavity size is normal. No evidence of transient ischemic dilation (TID) noted.   CT images were obtained for attenuation correction and were examined for the presence of coronary calcium  when appropriate.   Coronary calcium  was present on the attenuation correction CT images. Mild coronary calcifications were present. Coronary calcifications were present in the left anterior descending artery distribution(s).  Stent is presenct in the LCx.   Prior study available for comparison from 11/22/2022.   The study is normal. The study is low risk.    Administration History     None          Latest Ref Rng & Units 09/03/2023   11:08 AM  PFT Results  FVC-Pre L 3.61   FVC-Predicted Pre % 81   FVC-Post L 3.73   FVC-Predicted Post % 84   Pre FEV1/FVC % % 78   Post FEV1/FCV % % 77   FEV1-Pre L 2.81   FEV1-Predicted Pre % 87   FEV1-Post L 2.85   DLCO uncorrected ml/min/mmHg 22.93   DLCO UNC% % 88   DLCO corrected ml/min/mmHg 22.00   DLCO COR %Predicted % 84   DLVA Predicted % 100   TLC L 6.05   TLC % Predicted % 83   RV % Predicted % 93     No results found for: NITRICOXIDE   Assessment & Plan:   Assessment & Plan Shortness of breath - Workup ongoing but pulmonary function test indicate the lung  function is normal. -Further  workup pending per cardiology -Overall low risk for pulmonary complications following colonoscopy per ARISCAT score: Low risk 1.6% risk of in-hospital post-op pulmonary complications (composite including respiratory failure, respiratory infection, pleural effusion, atelectasis, pneumothorax, bronchospasm, aspiration pneumonitis) Abnormal CT of the chest - Repeat chest CT in January 2026 with subsequent follow-up in clinic afterwards; order placed by Dr. Kara  Return if symptoms worsen or fail to improve.  Follow up after CT in January 2026; sooner if new or worsening symptoms.  Candis Dandy, PA-C 04/06/2024

## 2024-04-06 NOTE — Patient Instructions (Addendum)
 Risk assessment completed:  ARISCAT score indicates the following: Low risk 1.6% risk of in-hospital post-op pulmonary complications (composite including respiratory failure, respiratory infection, pleural effusion, atelectasis, pneumothorax, bronchospasm, aspiration pneumonitis)  Will need early ambulation, incentive spirometry post procedure but is overall low risk for colonoscopy.

## 2024-04-07 NOTE — Telephone Encounter (Signed)
 Pulmonology clearance received. Per pulm OV note:    Electronically signed:  Corean Amsterdam, NCMA

## 2024-04-08 ENCOUNTER — Encounter: Payer: Self-pay | Admitting: Gastroenterology

## 2024-04-08 ENCOUNTER — Ambulatory Visit: Admitting: Gastroenterology

## 2024-04-08 VITALS — BP 133/77 | HR 58 | Temp 97.2°F | Resp 15 | Ht 71.0 in | Wt 217.0 lb

## 2024-04-08 DIAGNOSIS — Z860101 Personal history of adenomatous and serrated colon polyps: Secondary | ICD-10-CM | POA: Diagnosis not present

## 2024-04-08 DIAGNOSIS — D120 Benign neoplasm of cecum: Secondary | ICD-10-CM | POA: Diagnosis not present

## 2024-04-08 DIAGNOSIS — Z13810 Encounter for screening for upper gastrointestinal disorder: Secondary | ICD-10-CM

## 2024-04-08 DIAGNOSIS — I2510 Atherosclerotic heart disease of native coronary artery without angina pectoris: Secondary | ICD-10-CM | POA: Diagnosis not present

## 2024-04-08 DIAGNOSIS — K6289 Other specified diseases of anus and rectum: Secondary | ICD-10-CM

## 2024-04-08 DIAGNOSIS — R1032 Left lower quadrant pain: Secondary | ICD-10-CM | POA: Diagnosis not present

## 2024-04-08 DIAGNOSIS — D122 Benign neoplasm of ascending colon: Secondary | ICD-10-CM

## 2024-04-08 DIAGNOSIS — I10 Essential (primary) hypertension: Secondary | ICD-10-CM | POA: Diagnosis not present

## 2024-04-08 DIAGNOSIS — K5730 Diverticulosis of large intestine without perforation or abscess without bleeding: Secondary | ICD-10-CM | POA: Diagnosis not present

## 2024-04-08 DIAGNOSIS — K319 Disease of stomach and duodenum, unspecified: Secondary | ICD-10-CM | POA: Diagnosis not present

## 2024-04-08 DIAGNOSIS — K5909 Other constipation: Secondary | ICD-10-CM | POA: Diagnosis not present

## 2024-04-08 DIAGNOSIS — E785 Hyperlipidemia, unspecified: Secondary | ICD-10-CM | POA: Diagnosis not present

## 2024-04-08 DIAGNOSIS — K3189 Other diseases of stomach and duodenum: Secondary | ICD-10-CM | POA: Diagnosis not present

## 2024-04-08 DIAGNOSIS — Z1211 Encounter for screening for malignant neoplasm of colon: Secondary | ICD-10-CM | POA: Diagnosis not present

## 2024-04-08 DIAGNOSIS — K317 Polyp of stomach and duodenum: Secondary | ICD-10-CM | POA: Diagnosis not present

## 2024-04-08 DIAGNOSIS — Z860100 Personal history of colon polyps, unspecified: Secondary | ICD-10-CM

## 2024-04-08 DIAGNOSIS — K219 Gastro-esophageal reflux disease without esophagitis: Secondary | ICD-10-CM | POA: Diagnosis not present

## 2024-04-08 MED ORDER — SODIUM CHLORIDE 0.9 % IV SOLN: 500.0000 mL | Freq: Once | INTRAVENOUS | Status: DC

## 2024-04-08 NOTE — Progress Notes (Signed)
 History and Physical:  This patient presents for endoscopic testing for: Encounter Diagnoses  Name Primary?   Chronic constipation Yes   Hx of colonic polyps    Gastroesophageal reflux disease, unspecified whether esophagitis present    LLQ abdominal pain     78 year old man here today for endoscopic evaluation of left lower quadrant pain and longstanding GERD as well as a history of colon polyps.  Clinical details are outlined in my 03/03/2024 office note with no significant changes since then.  He has been seen by both his cardiologist and pulmonologist since that visit. 2 adenomatous polyps March 2017 5 subcentimeter adenomatous polyps March 2021   Patient is otherwise without complaints or active issues today.   Past Medical History: Past Medical History:  Diagnosis Date   Adenomatous colon polyp 11/02/2019   Anal fissure 08/17/2012   Really more gluteal crease irritation- protosol hc 2.5% prn    BACK PAIN, CHRONIC 02/10/2007   CAD (coronary artery disease), native coronary artery    02/24/17-PCI/DES to mLcx, normal EF.   Diverticulosis of colon    2007   GERD 02/23/2007   GERD (gastroesophageal reflux disease)    Hyperlipidemia    Hypertension    OCD (obsessive compulsive disorder) 01/23/2016   Ocd/hoarding issues- psychiatristis- Cottle started on zoloft 100mg --> paxil    Personal history of colonic polyps 03/14/2014   08/2005. Benign. 10 year repeat. 2010 colonoscopy in record was entered erroneously.     PSORIASIS 02/10/2007   Topical therapy.      Scalp psoriasis    SQUAMOUS CELL CARCINOMA OF SKIN SITE UNSPECIFIED 08/24/2010   Annotation: face      Past Surgical History: Past Surgical History:  Procedure Laterality Date   COLONOSCOPY     COLONOSCOPY W/ POLYPECTOMY     2007 Dr Debrah   CORONARY STENT INTERVENTION N/A 02/24/2017   Procedure: Coronary Stent Intervention;  Surgeon: Anner Alm ORN, MD;  Location: [facility] INVASIVE CV LAB;  Service: Cardiovascular;  Laterality:  N/A;   LEFT HEART CATH AND CORONARY ANGIOGRAPHY N/A 02/24/2017   Procedure: Left Heart Cath and Coronary Angiography;  Surgeon: Anner Alm ORN, MD;  Location: [facility] INVASIVE CV LAB;  Service: Cardiovascular;  Laterality: N/A;   POLYPECTOMY     SKIN CANCER EXCISION     squamous cell CA - nose   TONSILLECTOMY     TOOTH EXTRACTION  02/2024    Allergies: No Known Allergies  Outpatient Meds: Current Outpatient Medications  Medication Sig Dispense Refill   albuterol  (VENTOLIN  HFA) 108 (90 Base) MCG/ACT inhaler Inhale 2 puffs into the lungs every 6 (six) hours as needed for wheezing or shortness of breath. 8 g 6   apixaban  (ELIQUIS ) 5 MG TABS tablet Take 1 tablet (5 mg total) by mouth 2 (two) times daily. 60 tablet 11   clobetasol (TEMOVATE) 0.05 % external solution Apply 1 application topically at bedtime as needed (scalp).   5   clobetasol cream (TEMOVATE) 0.05 % Apply 1 application topically 2 (two) times daily as needed (abdomen).   3   clotrimazole -betamethasone  (LOTRISONE ) cream Apply 1 application topically 2 (two) times daily as needed. psoriass     desonide  (DESONATE ) 0.05 % gel Apply 1 application  topically 2 (two) times daily as needed. Skin lesions     fenofibrate  160 MG tablet Take 1 tablet (160 mg total) by mouth daily. 90 tablet 3   fluocinolone (VANOS) 0.01 % cream Apply 1 application topically daily as needed.  hydrocortisone  (ANUSOL -HC) 2.5 % rectal cream Place 1 application rectally 2 (two) times daily as needed for hemorrhoids or anal itching.     pantoprazole  (PROTONIX ) 40 MG tablet TAKE 1 TABLET BY MOUTH EVERY DAY (Patient taking differently: Take 40 mg by mouth daily. Two to three times per week) 90 tablet 1   polyethylene glycol (MIRALAX / GLYCOLAX) 17 g packet Take 17 g by mouth as needed.     rosuvastatin  (CRESTOR ) 40 MG tablet TAKE 1 TABLET BY MOUTH EVERY DAY 90 tablet 1   silver sulfADIAZINE (SILVADENE) 1 % cream Apply 1 application topically 2 (two) times daily  as needed (groin).   5   VASCEPA  1 g capsule TAKE 2 CAPSULES BY MOUTH TWO TIMES A DAY. 120 capsule 5   Current Facility-Administered Medications  Medication Dose Route Frequency Provider Last Rate Last Admin   0.9 %  sodium chloride  infusion  500 mL Intravenous Once Danis, Victory CROME III, MD          ___________________________________________________________________ Objective   Exam:  BP 122/72   Pulse 72   Temp (!) 97.2 F (36.2 C) (Skin)   Ht 5' 11 (1.803 m)   Wt 217 lb (98.4 kg)   SpO2 93%   BMI 30.27 kg/m   CV: regular , S1/S2 Resp: clear to auscultation bilaterally, normal RR and effort noted GI: soft, no tenderness, with active bowel sounds.   Assessment: Encounter Diagnoses  Name Primary?   Chronic constipation Yes   Hx of colonic polyps    Gastroesophageal reflux disease, unspecified whether esophagitis present    LLQ abdominal pain      Plan: Colonoscopy EGD  Eliquis  has been held over the last 2 days  The benefits and risks of the planned procedure(s) were described in detail with the patient or (when appropriate) their health care proxy.  Risks were outlined as including, but not limited to, bleeding, infection, perforation, adverse medication reaction leading to cardiac or pulmonary decompensation, pancreatitis (if ERCP).  The limitation of incomplete mucosal visualization was also discussed.  No guarantees or warranties were given.  The patient is appropriate for an endoscopic procedure in the ambulatory setting.   - Victory Brand, MD

## 2024-04-08 NOTE — Op Note (Signed)
 [facility] Patient Name: Charles Villa Procedure Date: 04/08/2024 2:06 PM MRN: [id number] Endoscopist: Victory L. Villa , MD, [id number] Age: 78 Referring MD:  Date of Birth: 14-May-1946 Gender: Male Account #: [id number] Procedure:                Colonoscopy Indications:              Surveillance: Personal history of adenomatous                            polyps on last colonoscopy 5 years ago                           5 SubCM TA in 2021. 2 TA in 2017 Medicines:                Monitored Anesthesia Care Procedure:                Pre-Anesthesia Assessment:                           - Prior to the procedure, a History and Physical                            was performed, and patient medications and                            allergies were reviewed. The patient's tolerance of                            previous anesthesia was also reviewed. The risks                            and benefits of the procedure and the sedation                            options and risks were discussed with the patient.                            All questions were answered, and informed consent                            was obtained. Prior Anticoagulants: The patient has                            taken Eliquis  (apixaban ), last dose was 2 days                            prior to procedure. ASA Grade Assessment: III - A                            patient with severe systemic disease. After                            reviewing the risks and benefits, the patient was  deemed in satisfactory condition to undergo the                            procedure.                           After obtaining informed consent, the colonoscope                            was passed under direct vision. Throughout the                            procedure, the patient's blood pressure, pulse, and                            oxygen saturations were monitored continuously. The                             Olympus Scope SN [id number] was introduced through the                            anus and advanced to the the cecum, identified by                            appendiceal orifice and ileocecal valve. The                            colonoscopy was somewhat difficult due to a                            redundant colon. Successful completion of the                            procedure was aided by using manual pressure and                            straightening and shortening the scope to obtain                            bowel loop reduction. The patient tolerated the                            procedure well. The quality of the bowel                            preparation was adequate after lavage. The                            ileocecal valve, appendiceal orifice, and rectum                            were photographed. Scope In: 2:42:47 PM Scope Out: 3:05:47 PM Scope Withdrawal Time: 0 hours 16 minutes 2 seconds  Total Procedure Duration: 0 hours 23 minutes 0 seconds  Findings:                 The digital rectal exam findings include decreased                            sphincter tone.                           Multiple diverticula were found in the entire colon.                           Four sessile polyps were found in the proximal                            ascending colon. The polyps were diminutive in                            size. These polyps were removed with a cold snare.                            Resection and retrieval were complete.                           The exam was otherwise without abnormality on                            direct and retroflexion views. Complications:            No immediate complications. Estimated Blood Loss:     Estimated blood loss was minimal. Impression:               - Decreased sphincter tone found on digital rectal                            exam.                           - Diverticulosis in the entire examined colon.                            - Four diminutive polyps in the proximal ascending                            colon, removed with a cold snare. Resected and                            retrieved.                           - The examination was otherwise normal on direct                            and retroflexion views. Recommendation:           - Patient has a contact number available for  emergencies. The signs and symptoms of potential                            delayed complications were discussed with the                            patient. Return to normal activities tomorrow.                            Written discharge instructions were provided to the                            patient.                           - Resume previous diet.                           - Resume Eliquis  (apixaban ) at prior dose tomorrow.                           - Await pathology results.                           - No repeat surveillance colonoscopy recommended                            due to age and current guidelines. Charles Jubb L. Legrand, MD 04/08/2024 3:26:35 PM This report has been signed electronically.

## 2024-04-08 NOTE — Progress Notes (Signed)
 Called to room to assist during endoscopic procedure.  Patient ID and intended procedure confirmed with present staff. Received instructions for my participation in the procedure from the performing physician.

## 2024-04-08 NOTE — Op Note (Signed)
 [facility] Patient Name: Charles Villa Procedure Date: 04/08/2024 2:37 PM MRN: [id number] Endoscopist: Victory L. Legrand , MD, [id number] Age: 78 Referring MD:  Date of Birth: [birth year] Gender: Male Account #: [id number] Procedure:                Upper GI endoscopy Indications:              Heartburn, Screening for Barrett's esophagus in                            patient at risk for this condition Medicines:                Monitored Anesthesia Care Procedure:                Pre-Anesthesia Assessment:                           - Prior to the procedure, a History and Physical                            was performed, and patient medications and                            allergies were reviewed. The patient's tolerance of                            previous anesthesia was also reviewed. The risks                            and benefits of the procedure and the sedation                            options and risks were discussed with the patient.                            All questions were answered, and informed consent                            was obtained. Prior Anticoagulants: The patient has                            taken Eliquis  (apixaban ), last dose was 2 days                            prior to procedure. ASA Grade Assessment: III - A                            patient with severe systemic disease. After                            reviewing the risks and benefits, the patient was                            deemed in satisfactory condition to undergo the  procedure.                           After obtaining informed consent, the endoscope was                            passed under direct vision. Throughout the                            procedure, the patient's blood pressure, pulse, and                            oxygen saturations were monitored continuously. The                            Olympus Scope [id number] was introduced through  the                            mouth, and advanced to the second part of duodenum.                            The upper GI endoscopy was accomplished without                            difficulty. The patient tolerated the procedure                            well. Scope In: Scope Out: Findings:                 The larynx was normal.                           The esophagus was normal.                           Striped mildly erythematous mucosa was found in the                            gastric antrum. Several biopsies were obtained in                            the gastric body and in the gastric antrum with                            cold forceps for histology.                           Multiple small sessile fundic gland polyps were                            found in the gastric fundus and in the gastric body.                           The cardia and gastric fundus were normal on  retroflexion.                           The examined duodenum was normal. Complications:            No immediate complications. Estimated Blood Loss:     Estimated blood loss was minimal. Impression:               - Normal larynx.                           - Normal esophagus.                           - Erythematous mucosa in the antrum.                           - Multiple fundic gland polyps. Benign-appearing                            (from PPI use)                           - Normal examined duodenum.                           - Several biopsies were obtained in the gastric                            body and in the gastric antrum. Recommendation:           - Patient has a contact number available for                            emergencies. The signs and symptoms of potential                            delayed complications were discussed with the                            patient. Return to normal activities tomorrow.                            Written discharge  instructions were provided to the                            patient.                           - Resume previous diet.                           - Continue present medications.                           - Await pathology results.                           - See the other procedure  note for documentation of                            additional recommendations. Teresina Bugaj L. Legrand, MD 04/08/2024 3:20:41 PM This report has been signed electronically.

## 2024-04-08 NOTE — Patient Instructions (Addendum)
 Resume previous diet. Continue present medications. Resume Eliquis  (apixaban ) at prior dose tomorrow. Awaiting pathology results. No repeat surveillance colonoscopy recommended due to age. Handouts provided on polyps and diverticulosis.   YOU HAD AN ENDOSCOPIC PROCEDURE TODAY AT THE [facility]:   Refer to the procedure report that was given to you for any specific questions about what was found during the examination.  If the procedure report does not answer your questions, please call your gastroenterologist to clarify.  If you requested that your care partner not be given the details of your procedure findings, then the procedure report has been included in a sealed envelope for you to review at your convenience later.  YOU SHOULD EXPECT: Some feelings of bloating in the abdomen. Passage of more gas than usual.  Walking can help get rid of the air that was put into your GI tract during the procedure and reduce the bloating. If you had a lower endoscopy (such as a colonoscopy or flexible sigmoidoscopy) you may notice spotting of blood in your stool or on the toilet paper. If you underwent a bowel prep for your procedure, you may not have a normal bowel movement for a few days.  Please Note:  You might notice some irritation and congestion in your nose or some drainage.  This is from the oxygen used during your procedure.  There is no need for concern and it should clear up in a day or so.  SYMPTOMS TO REPORT IMMEDIATELY:  Following lower endoscopy (colonoscopy or flexible sigmoidoscopy):  Excessive amounts of blood in the stool  Significant tenderness or worsening of abdominal pains  Swelling of the abdomen that is new, acute  Fever of 100F or higher  Following upper endoscopy (EGD)  Vomiting of blood or coffee ground material  New chest pain or pain under the shoulder blades  Painful or persistently difficult swallowing  New shortness of breath  Fever of 100F or  higher  Black, tarry-looking stools  For urgent or emergent issues, a gastroenterologist can be reached at any hour by calling [phone]. Do not use MyChart messaging for urgent concerns.    DIET:  We do recommend a small meal at first, but then you may proceed to your regular diet.  Drink plenty of fluids but you should avoid alcoholic beverages for 24 hours.  ACTIVITY:  You should plan to take it easy for the rest of today and you should NOT DRIVE or use heavy machinery until tomorrow (because of the sedation medicines used during the test).    FOLLOW UP: Our staff will call the number listed on your records the next business day following your procedure.  We will call around 7:15- 8:00 am to check on you and address any questions or concerns that you may have regarding the information given to you following your procedure. If we do not reach you, we will leave a message.     If any biopsies were taken you will be contacted by phone or by letter within the next 1-3 weeks.  Please call us  at [phone] if you have not heard about the biopsies in 3 weeks.    SIGNATURES/CONFIDENTIALITY: You and/or your care partner have signed paperwork which will be entered into your electronic medical record.  These signatures attest to the fact that that the information above on your After Visit Summary has been reviewed and is understood.  Full responsibility of the confidentiality of this discharge information lies with you and/or your  care-partner.

## 2024-04-08 NOTE — Telephone Encounter (Signed)
Understood, thank you  - H. Danis

## 2024-04-08 NOTE — Progress Notes (Signed)
 Sedate, gd SR, tolerated procedure well, VSS, report to RN

## 2024-04-09 ENCOUNTER — Telehealth: Payer: Self-pay

## 2024-04-09 NOTE — Telephone Encounter (Signed)
  Follow up Call-     04/08/2024    1:58 PM  Call back number  Post procedure Call Back phone  # [phone]  Permission to leave phone message Yes    Attempted to call regarding follow-up. No answer, VM left.

## 2024-04-12 ENCOUNTER — Ambulatory Visit: Payer: Self-pay | Admitting: Cardiology

## 2024-04-13 LAB — SURGICAL PATHOLOGY

## 2024-04-14 ENCOUNTER — Ambulatory Visit: Payer: Self-pay | Admitting: Gastroenterology

## 2024-04-17 ENCOUNTER — Ambulatory Visit: Payer: Self-pay | Admitting: Pulmonary Disease

## 2024-05-05 NOTE — Progress Notes (Deleted)
 Cardiology Office Note    Date:  05/05/2024  ID:  Charles, Villa 08-27-1945, MRN [id number] PCP:  [name], MD  Cardiologist:  Vinie JAYSON Maxcy, MD  Electrophysiologist:  None   Chief Complaint: ***  History of Present Illness: .    Charles Villa is a 78 y.o. male with visit-pertinent history of CAD, hypertension, hyperlipidemia, GERD, chronic back pain, longstanding alcohol use and psoriasis.   Echocardiogram on 02/15/2019 teen showed EF 55 to 60%, no regional wall motion abnormality, grade 1 DD, trivial TR.  Exercise tolerance test obtained on 02/14/2017 showed horizontal ST segment depression noted in the inferolateral leads.  Subsequent cardiac catheterization on 02/25/2019 teen showed 90% mid left circumflex lesion treated with a 3.5 x 16 mm DES, EF 55 to 65%.   Echocardiogram in setting of dyspnea on 04/26/2022 showed EF 66 5%, no regional wall motion abnormality, G1 DD, no significant valve issue.  MPI on/26/24 was low risk, no evidence of ischemia or infarction, EF 63%.  Patient was seen in clinic on 06/10/2023 by [name], PA.  Patient was to undergo cataract surgery the day prior however surgery was delayed after anesthesia noted irregular heartbeat.  Subsequent EKG demonstrated atrial flutter with heart rate of 97 bpm.  Office visit on 06/10/2023 was noted the patient had returned to sinus rhythm.  He was started on Eliquis  5 mg twice daily.  Cardiac monitor in 06/2023 indicated sinus rhythm with IVCD, 10 runs of SVT were noted atrial fibrillation burden 6%.   Patient was in clinic on 07/07/2023 by Dr. Maxcy.  He continued to note some progressive dyspnea.  Patient had been referred to pulmonology given dyspnea as this was felt to not be cardiac related.  Was recommended that he have 8-month follow-up.  Patient was last in clinic on 03/25/2024 for preoperative cardiac evaluation for EGD and colonoscopy.  Patient noted he continued have increased dyspnea on exertion,  reported that when he exerted himself his breathing would worsen.  Also reported decreased activity tolerance.  It was recommended that patient undergo MPI given worsening dyspnea, MPI on 04/02/2024 was normal with no evidence of ischemia or infarction.  Today he presents for follow-up.  He reports that he  CAD: Cardiac catheterization on 02/24/2017 showed 90% mid left circumflex lesion treated with 3.5 x 16 mm DES. MPI on 11/22/2022 was normal and low risk.  MPI on 04/02/2024 was normal and low risk. Today he reports  Atrial fibrillation/flutter: Noted in 05/2023.  Cardiac monitor indicated atrial fibrillation burden of 6%. Patient denies any palpitations or feeling of atrial flutter fibrillation. Patient denies any bleeding problems on Eliquis .  Hypotension: Patient with intermittent history of low blood pressures, improved in office.  Blood pressure today Patient denies any dizziness, lightheadedness, presyncope or syncope.  Inferior abdominal aortic aneurysm: Measuring 3.5 cm on CT in May 2024.  Plan for follow-up study in 2026.  Hyperlipidemia: Last lipid profile on 9//2025 indicated total cholesterol 121, HDL 44, triglycerides 86 and LDL 60.  Labwork independently reviewed:   ROS: .   *** denies chest pain, shortness of breath, lower extremity edema, fatigue, palpitations, melena, hematuria, hemoptysis, diaphoresis, weakness, presyncope, syncope, orthopnea, and PND.  All other systems are reviewed and otherwise negative.  Studies Reviewed: SABRA    EKG:  EKG is ordered today, personally reviewed, demonstrating ***     CV Studies: Cardiac studies reviewed are outlined and summarized above. Otherwise please see EMR for full report. Cardiac Studies &  Procedures   ______________________________________________________________________________________________ CARDIAC CATHETERIZATION  CARDIAC CATHETERIZATION 02/24/2017  Conclusion Images from the original result were not included.   Mid  Cx lesion, 90 %stenosed.  A STENT PROMUS PREM MR 3.5X16 drug eluting stent was successfully placed.  Post intervention, there is a 0% residual stenosis.  _______________  Moderate disease in the ostial and mid LAD with proximal-mid ectasia  The left ventricular systolic function is normal. The left ventricular ejection fraction is 55-65% by visual estimate.  LV end diastolic pressure is normal.  Severe focal single vessel disease involving the mid circumflex just prior to large lateral OM branch. Successful PCI DES.  Plan:  Same-day discharged today on dual antiplatelet therapy for minimum one year.  Patient will likely need titration of his anti-hypertensive agents as well as cardiac risk factor modification.  He will follow with Dr. [name].   Alm Clay, M.D., M.S. Interventional Cardiologist  Pager # [phone] Phone # [phone] [address] [place], [place] [address]  Findings Coronary Findings Diagnostic  Dominance: Right  Left Main Vessel is large. Vessel is angiographically normal.  Left Anterior Descending In the proximal to midportion is a patulous/ectatic appearing after the initial ostial lesions that appears to really be the true size of the vessel. Beyond the mid 40% lesion, the vessel tapers to a relatively small caliber vessel to before it reaches the apex The lesion is focal and concentric. The lesion is focal.  First Septal Branch Vessel is small in size.  Second Diagonal Branch Vessel is small in size.  Second Septal Branch Vessel is small in size.  Third Septal Branch Vessel is small in size.  Ramus Intermedius Vessel is moderate in size. The lesion is eccentric.  Lateral Ramus Intermedius Vessel is small in size.  Left Circumflex Vessel is large. The lesion is located proximal to the major branch, focal and eccentric.  First Obtuse Marginal Branch Vessel is small in size.  Lateral Second Obtuse Marginal  Branch Vessel is small in size.  Right Coronary Artery Vessel is large. Somewhat ectatic vessel The lesion is segmental. Smooth, diffuse  Acute Marginal Branch Vessel is moderate in size.  Right Posterior Descending Artery Vessel is moderate in size. Vessel is angiographically normal. The vessel is tortuous.  Inferior Septal Vessel is small in size.  First Right Posterolateral Branch Vessel is small in size.  Second Right Posterolateral Branch Vessel is small in size.  Intervention  Mid Cx lesion Angioplasty Lesion length: 13 mm. Lesion crossed with guidewire using a WIRE ASAHI PROWATER 180CM. Pre-stent angioplasty was performed using a BALLOON SAPPHIRE 3.0X12. Maximum pressure: 12 atm. Inflation time: 20 sec. A STENT PROMUS PREM MR 3.5X16 drug eluting stent was successfully placed. Minimum lumen area: 3.9 mm. Stent strut is well apposed. Post-stent angioplasty was performed using a BALLOON [place] EUPHORA RX3.5X12. Maximum pressure: 16 atm. Inflation time: 20 sec. The pre-interventional distal flow is normal (TIMI 3).  The post-interventional distal flow is normal (TIMI 3). The intervention was successful . No complications occurred at this lesion. CATH VISTA GUIDE 6FR XBLAD3.5 (Yes) There is a 0% residual stenosis post intervention.   STRESS TESTS  MYOCARDIAL PERFUSION IMAGING 04/02/2024  Interpretation Summary   A pharmacological stress test was performed using IV Lexiscan  0.4mg  over 10 seconds performed without concurrent submaximal exercise. The patient reported dyspnea during the stress test. Normal blood pressure and normal heart rate response noted during stress. Heart rate recovery was normal.   No ST deviation was noted. ECG was  interpretable and without significant changes. The ECG was not diagnostic due to pharmacologic protocol.   LV perfusion is normal. There is no evidence of ischemia. There is no evidence of infarction.   Left ventricular function is normal. Nuclear  stress EF: 70%. End diastolic cavity size is normal. End systolic cavity size is normal. No evidence of transient ischemic dilation (TID) noted.   CT images were obtained for attenuation correction and were examined for the presence of coronary calcium  when appropriate.   Coronary calcium  was present on the attenuation correction CT images. Mild coronary calcifications were present. Coronary calcifications were present in the left anterior descending artery distribution(s).  Stent is presenct in the LCx.   Prior study available for comparison from 11/22/2022.   The study is normal. The study is low risk.   ECHOCARDIOGRAM  ECHOCARDIOGRAM COMPLETE 04/26/2022  Narrative ECHOCARDIOGRAM REPORT    Patient Name:   BLAIDEN WERTH Date of Exam: 04/26/2022 Medical Rec #:  [id number]        Height:       71.0 in Accession #:    [id number]       Weight:       227.2 lb Date of Birth:  02/13/1946        BSA:          2.226 m Patient Age:    78 years         BP:           106/66 mmHg Patient Gender: M                HR:           58 bpm. Exam Location:  [address]  Procedure: 2D Echo, Cardiac Doppler, Color Doppler and Intracardiac Opacification Agent  Indications:    r06.09 Dyspnea  History:        Patient has prior history of Echocardiogram examinations, most recent 02/18/2017. CAD; Risk Factors:Hypertension and Dyslipidemia.  Sonographer:    Carl Coma RDCS Referring Phys: [id number] ELIZABETH PECK  IMPRESSIONS   1. Left ventricular ejection fraction, by estimation, is 60 to 65%. The left ventricle has normal function. The left ventricle has no regional wall motion abnormalities. Left ventricular diastolic parameters are consistent with Grade I diastolic dysfunction (impaired relaxation). 2. Right ventricular systolic function is normal. The right ventricular size is normal. Tricuspid regurgitation signal is inadequate for assessing PA pressure. 3. The mitral valve is normal in  structure. No evidence of mitral valve regurgitation. No evidence of mitral stenosis. 4. The aortic valve is grossly normal. Aortic valve regurgitation is not visualized. No aortic stenosis is present. 5. The inferior vena cava is normal in size with greater than 50% respiratory variability, suggesting right atrial pressure of 3 mmHg.  Conclusion(s)/Recommendation(s): Normal biventricular function without evidence of hemodynamically significant valvular heart disease.  FINDINGS Left Ventricle: Left ventricular ejection fraction, by estimation, is 60 to 65%. The left ventricle has normal function. The left ventricle has no regional wall motion abnormalities. Definity  contrast agent was given IV to delineate the left ventricular endocardial borders. The left ventricular internal cavity size was normal in size. There is no left ventricular hypertrophy. Left ventricular diastolic parameters are consistent with Grade I diastolic dysfunction (impaired relaxation).  Right Ventricle: The right ventricular size is normal. No increase in right ventricular wall thickness. Right ventricular systolic function is normal. Tricuspid regurgitation signal is inadequate for assessing PA pressure.  Left Atrium: Left atrial size was normal in size.  Right Atrium: Right atrial size was normal in size.  Pericardium: There is no evidence of pericardial effusion.  Mitral Valve: The mitral valve is normal in structure. No evidence of mitral valve regurgitation. No evidence of mitral valve stenosis.  Tricuspid Valve: The tricuspid valve is normal in structure. Tricuspid valve regurgitation is trivial. No evidence of tricuspid stenosis.  Aortic Valve: The aortic valve is grossly normal. Aortic valve regurgitation is not visualized. No aortic stenosis is present.  Pulmonic Valve: The pulmonic valve was normal in structure. Pulmonic valve regurgitation is trivial. No evidence of pulmonic stenosis.  Aorta: The aortic  root is normal in size and structure.  Venous: The inferior vena cava is normal in size with greater than 50% respiratory variability, suggesting right atrial pressure of 3 mmHg.  IAS/Shunts: No atrial level shunt detected by color flow Doppler.   LEFT VENTRICLE PLAX 2D LVIDd:         5.40 cm   Diastology LVIDs:         3.70 cm   LV e' medial:    6.31 cm/s LV PW:         0.60 cm   LV E/e' medial:  10.9 LV IVS:        0.60 cm   LV e' lateral:   8.16 cm/s LVOT diam:     1.90 cm   LV E/e' lateral: 8.4 LV SV:         59 LV SV Index:   26 LVOT Area:     2.84 cm   RIGHT VENTRICLE RV Basal diam:  4.30 cm RV S prime:     12.25 cm/s TAPSE (M-mode): 2.1 cm  LEFT ATRIUM             Index        RIGHT ATRIUM           Index LA diam:        4.20 cm 1.89 cm/m   RA Area:     11.90 cm LA Vol (A2C):   38.2 ml 17.16 ml/m  RA Volume:   29.20 ml  13.12 ml/m LA Vol (A4C):   41.4 ml 18.60 ml/m LA Biplane Vol: 42.1 ml 18.91 ml/m AORTIC VALVE LVOT Vmax:   91.25 cm/s LVOT Vmean:  59.050 cm/s LVOT VTI:    0.207 m  AORTA Ao Root diam: 3.40 cm Ao Asc diam:  3.60 cm  MITRAL VALVE MV Area (PHT): 2.69 cm    SHUNTS MV Decel Time: 282 msec    Systemic VTI:  0.21 m MV E velocity: 68.70 cm/s  Systemic Diam: 1.90 cm MV A velocity: 93.80 cm/s MV E/A ratio:  0.73  Soyla Merck MD Electronically signed by Soyla Merck MD Signature Date/Time: 04/27/2022/4:45:48 PM    Final    MONITORS  LONG TERM MONITOR (3-14 DAYS) 07/07/2023  Narrative Patch Wear Time:  13 days and 23 hours (2024-11-16T15:10:52-0500 to 2024-11-30T15:10:48-0500)  Patient had a min HR of 44 bpm, max HR of 229 bpm, and avg HR of 65 bpm. Predominant underlying rhythm was Sinus Rhythm. Bundle Branch Block/IVCD was present. 10 Supraventricular Tachycardia runs occurred, the run with the fastest interval lasting 5 beats with a max rate of 126 bpm, the longest lasting 10 beats with an avg rate of 108 bpm. Atrial  Fibrillation occurred (6% burden), ranging from 44-229 bpm (avg of 91 bpm), the longest lasting 4 hours 52 mins with an avg rate of 71 bpm. Atrial Fibrillation was present at  de-activation of device. Isolated SVEs were occasional (4.2%, 55528), SVE Couplets were rare (<1.0%, 3946), and SVE Triplets were rare (<1.0%, 1062). Isolated VEs were rare (<1.0%), VE Couplets were rare (<1.0%), and no VE Triplets were present. MD notification criteria for Rapid Atrial Fibrillation met - report posted prior to notification per account request (DB).  Monitor shows sinus rhythm with an IVCD.  10 runs of SVT were noted.  A-fib/flutter was noted occasionally rapid.  PACs occurred with a frequency of about 4.2%.  The frequency of atrial fibrillation was about 6% during the monitoring period.  He is anticoagulated.  Vinie KYM Maxcy, MD, [facility], FACP [place]  [facility] Medical Director of the [facility]ovascular [facility] Diplomate of the American Board of Clinical Lipidology Attending Cardiologist Direct Dial: [phone]  Fax: [phone] Website:  [email or web address]       ______________________________________________________________________________________________       Current Reported Medications:.    No outpatient medications have been marked as taking for the 05/06/24 encounter (Appointment) with Jaxan Michel D, NP.    Physical Exam:    VS:  There were no vitals taken for this visit.   Wt Readings from Last 3 Encounters:  04/08/24 217 lb (98.4 kg)  04/06/24 218 lb (98.9 kg)  03/25/24 213 lb 6.4 oz (96.8 kg)    GEN: Well nourished, well developed in no acute distress NECK: No JVD; No carotid bruits CARDIAC: ***RRR, no murmurs, rubs, gallops RESPIRATORY:  Clear to auscultation without rales, wheezing or rhonchi  ABDOMEN: Soft, non-tender, non-distended EXTREMITIES:  No edema; No acute deformity     Asessement and Plan:.     ***      Disposition: F/u with ***  Signed, Yareni Creps D Theo Krumholz, NP

## 2024-05-06 ENCOUNTER — Ambulatory Visit: Admitting: Cardiology

## 2024-05-06 DIAGNOSIS — R0609 Other forms of dyspnea: Secondary | ICD-10-CM

## 2024-05-06 DIAGNOSIS — E785 Hyperlipidemia, unspecified: Secondary | ICD-10-CM

## 2024-05-06 DIAGNOSIS — I7143 Infrarenal abdominal aortic aneurysm, without rupture: Secondary | ICD-10-CM

## 2024-05-06 DIAGNOSIS — I4892 Unspecified atrial flutter: Secondary | ICD-10-CM

## 2024-05-06 DIAGNOSIS — I480 Paroxysmal atrial fibrillation: Secondary | ICD-10-CM

## 2024-05-06 DIAGNOSIS — I2510 Atherosclerotic heart disease of native coronary artery without angina pectoris: Secondary | ICD-10-CM

## 2024-05-10 ENCOUNTER — Ambulatory Visit: Admitting: Family Medicine

## 2024-05-10 ENCOUNTER — Encounter: Payer: Self-pay | Admitting: Family Medicine

## 2024-05-10 VITALS — BP 112/68 | HR 91 | Temp 96.6°F | Ht 71.0 in | Wt 213.4 lb

## 2024-05-10 DIAGNOSIS — M545 Low back pain, unspecified: Secondary | ICD-10-CM

## 2024-05-10 DIAGNOSIS — Z0000 Encounter for general adult medical examination without abnormal findings: Secondary | ICD-10-CM

## 2024-05-10 DIAGNOSIS — E785 Hyperlipidemia, unspecified: Secondary | ICD-10-CM | POA: Diagnosis not present

## 2024-05-10 DIAGNOSIS — Z23 Encounter for immunization: Secondary | ICD-10-CM | POA: Diagnosis not present

## 2024-05-10 DIAGNOSIS — F1021 Alcohol dependence, in remission: Secondary | ICD-10-CM | POA: Diagnosis not present

## 2024-05-10 DIAGNOSIS — G8929 Other chronic pain: Secondary | ICD-10-CM

## 2024-05-10 DIAGNOSIS — I10 Essential (primary) hypertension: Secondary | ICD-10-CM

## 2024-05-10 NOTE — Progress Notes (Signed)
 Phone: [phone]   Subjective:  Patient presents today for their annual physical. Chief complaint-noted.   See problem oriented charting- ROS- full  review of systems was completed and negative  except for topics noted under acute/chronic concerns  The following were reviewed and entered/updated in epic: Past Medical History:  Diagnosis Date   Adenomatous colon polyp 11/02/2019   Anal fissure 08/17/2012   Really more gluteal crease irritation- protosol hc 2.5% prn    BACK PAIN, CHRONIC 02/10/2007   CAD (coronary artery disease), native coronary artery    02/24/17-PCI/DES to mLcx, normal EF.   Diverticulosis of colon    2007   GERD 02/23/2007   GERD (gastroesophageal reflux disease)    Hyperlipidemia    Hypertension    OCD (obsessive compulsive disorder) 01/23/2016   Ocd/hoarding issues- psychiatristis- Cottle started on zoloft 100mg --> paxil    Personal history of colonic polyps 03/14/2014   08/2005. Benign. 10 year repeat. 2010 colonoscopy in record was entered erroneously.     PSORIASIS 02/10/2007   Topical therapy.      Scalp psoriasis    SQUAMOUS CELL CARCINOMA OF SKIN SITE UNSPECIFIED 08/24/2010   Annotation: face    Patient Active Problem List   Diagnosis Date Noted   Infrarenal abdominal aortic aneurysm (AAA) without rupture 02/03/2023    Priority: High   CAD S/P percutaneous coronary angioplasty 02/24/2017    Priority: High   Alcoholism in recovery (HCC) 02/03/2017    Priority: High   History of adenomatous polyp of colon 11/02/2019    Priority: Medium    Alcoholic peripheral neuropathy 02/09/2019    Priority: Medium    Thiamine deficiency 04/28/2017    Priority: Medium    Tremor 05/06/2016    Priority: Medium    OCD (obsessive compulsive disorder) 01/23/2016    Priority: Medium    Essential hypertension 03/11/2014    Priority: Medium    Hyperlipidemia with target low density lipoprotein (LDL) cholesterol less than 70 mg/dL 92/84/7991    Priority: Medium     PSORIASIS 02/10/2007    Priority: Medium    Senile purpura 05/06/2019    Priority: Low   S/P cardiac catheterization 02/24/2017    Priority: Low   Anal fissure 08/17/2012    Priority: Low   Squamous cell carcinoma of skin 08/24/2010    Priority: Low   GERD 02/23/2007    Priority: Low   Backache 02/10/2007    Priority: Low   Past Surgical History:  Procedure Laterality Date   COLONOSCOPY     COLONOSCOPY W/ POLYPECTOMY     2007 Dr Debrah   CORONARY STENT INTERVENTION N/A 02/24/2017   Procedure: Coronary Stent Intervention;  Surgeon: Anner Alm ORN, MD;  Location: [facility] INVASIVE CV LAB;  Service: Cardiovascular;  Laterality: N/A;   LEFT HEART CATH AND CORONARY ANGIOGRAPHY N/A 02/24/2017   Procedure: Left Heart Cath and Coronary Angiography;  Surgeon: Anner Alm ORN, MD;  Location: [facility] INVASIVE CV LAB;  Service: Cardiovascular;  Laterality: N/A;   POLYPECTOMY     SKIN CANCER EXCISION     squamous cell CA - nose   TONSILLECTOMY     TOOTH EXTRACTION  02/2024    Family History  Problem Relation Age of Onset   Stroke Mother    Hypertension Mother    Lung cancer Father        heavy smoker   Colon polyps Neg Hx    Colon cancer Neg Hx    Esophageal cancer Neg Hx  Rectal cancer Neg Hx    Stomach cancer Neg Hx     Medications- reviewed and updated Current Outpatient Medications  Medication Sig Dispense Refill   apixaban  (ELIQUIS ) 5 MG TABS tablet Take 1 tablet (5 mg total) by mouth 2 (two) times daily. 60 tablet 11   clobetasol (TEMOVATE) 0.05 % external solution Apply 1 application topically at bedtime as needed (scalp).   5   clobetasol cream (TEMOVATE) 0.05 % Apply 1 application topically 2 (two) times daily as needed (abdomen).   3   fenofibrate  160 MG tablet Take 1 tablet (160 mg total) by mouth daily. 90 tablet 3   fluocinolone (VANOS) 0.01 % cream Apply 1 application topically daily as needed.      hydrocortisone  (ANUSOL -HC) 2.5 % rectal cream Place 1 application  rectally 2 (two) times daily as needed for hemorrhoids or anal itching.     pantoprazole  (PROTONIX ) 40 MG tablet TAKE 1 TABLET BY MOUTH EVERY DAY (Patient taking differently: Take 40 mg by mouth daily. Two to three times per week) 90 tablet 1   polyethylene glycol (MIRALAX / GLYCOLAX) 17 g packet Take 17 g by mouth as needed.     rosuvastatin  (CRESTOR ) 40 MG tablet TAKE 1 TABLET BY MOUTH EVERY DAY 90 tablet 1   silver sulfADIAZINE (SILVADENE) 1 % cream Apply 1 application topically 2 (two) times daily as needed (groin).   5   VASCEPA  1 g capsule TAKE 2 CAPSULES BY MOUTH TWO TIMES A DAY. 120 capsule 5   albuterol  (VENTOLIN  HFA) 108 (90 Base) MCG/ACT inhaler Inhale 2 puffs into the lungs every 6 (six) hours as needed for wheezing or shortness of breath. (Patient not taking: Reported on 05/10/2024) 8 g 6   clotrimazole -betamethasone  (LOTRISONE ) cream Apply 1 application topically 2 (two) times daily as needed. psoriass (Patient not taking: Reported on 05/10/2024)     desonide  (DESONATE ) 0.05 % gel Apply 1 application  topically 2 (two) times daily as needed. Skin lesions (Patient not taking: Reported on 05/10/2024)     No current facility-administered medications for this visit.    Allergies-reviewed and updated No Known Allergies  Social History   Social History Narrative   Married 42 years in 2015. 2 kids, 1 grandchild that is 51.33 years old.       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      Hobbies: ride bike, ski in winter    Objective  Objective:  BP 112/68 (BP Location: Left Arm, Patient Position: Sitting, Cuff Size: Normal)   Pulse 91   Temp (!) 96.6 F (35.9 C) (Temporal)   Ht 5' 11 (1.803 m)   Wt 213 lb 6.4 oz (96.8 kg)   SpO2 94%   BMI 29.76 kg/m  Gen: NAD, resting comfortably HEENT: Mucous membranes are moist. Oropharynx normal Neck: no thyromegaly CV: RRR no murmurs rubs or gallops Lungs: CTAB no crackles, wheeze, rhonchi Abdomen:  soft/nontender/nondistended/normal bowel sounds. No rebound or guarding.  Ext: no edema Skin: warm, dry Neuro: grossly normal, moves all extremities, PERRLA Antalgic gait- due to knee pain   Assessment and Plan  78 y.o. male presenting for annual physical.  Health Maintenance counseling: 1. Anticipatory guidance: Patient counseled regarding regular dental exams -q6 months, eye exams -yearly,  avoiding smoking and second hand smoke , limiting alcohol to 2 beverages per day - 2 a day, no illicit drugs .   2. Risk factor reduction:  Advised patient of need for regular exercise and  diet rich and fruits and vegetables to reduce risk of heart attack and stroke.  Exercise- not exercising- encouraged to start- states he hates it.  Diet/weight management-weight down primarily due to gastroenterology illness.  Wt Readings from Last 3 Encounters:  05/10/24 213 lb 6.4 oz (96.8 kg)  04/08/24 217 lb (98.4 kg)  04/06/24 218 lb (98.9 kg)  3. Immunizations/screenings/ancillary studies- holding off on COVID and shingles. Flu shot today Immunization History  Administered Date(s) Administered   DTP 08/10/2007   Fluad Quad(high Dose 65+) 05/06/2019   INFLUENZA, HIGH DOSE SEASONAL PF 05/12/2013, 05/06/2016, 05/05/2018, 05/10/2024   Influenza Split 06/08/2012   Influenza Whole 08/21/2009   Influenza, Seasonal, Injecte, Preservative Fre 05/23/2013   Influenza,inj,Quad PF,6+ Mos 07/12/2020   Influenza-Unspecified 05/01/2015, 06/29/2023   PFIZER Comirnaty(Gray Top)Covid-19 Tri-Sucrose Vaccine 11/30/2020   PFIZER(Purple Top)SARS-COV-2 Vaccination 09/03/2019, 09/24/2019   Pfizer Covid-19 Vaccine Bivalent Booster 66yrs & up 08/23/2021   Pneumococcal Conjugate-13 08/30/2013   Pneumococcal Polysaccharide-23 03/11/2014   Respiratory Syncytial Virus Vaccine,Recomb Aduvanted(Arexvy) 07/01/2022   Td 07/29/1998, 08/15/2008   Tdap 10/07/2016  4. Prostate cancer screening- low risk trend- we have opted out of repeats-  no urinary changes  Lab Results  Component Value Date   PSA 0.44 05/05/2018   PSA 0.40 08/04/2015   PSA 0.44 03/07/2014   5. Colon cancer screening - 04/08/24 with one adenoma but released 6. Skin cancer screening- sees Dr. Bard Molt. advised regular sunscreen use. Denies worrisome, changing, or new skin lesions- other than spot on scalp he needs to treat with cream from dermatology- chemotherapy cream- possible AK  7. Smoking associated screening (lung cancer screening, AAA screen 65-75, UA)- never smoker 8. STD screening - only active with wife  Status of chronic or acute concerns   #renal function when looking back is worsening- baseline around 1.2-1.3 but trending up to 1.38 a year ago then 1.54 July 2024 and 1.72 August. May need further workup- get urinalysis and UACR as well  # Left knee pain for 1 month-has some Voltaren or similar at home may try    # Nausea each morning recently-reports appetite has been low starting Saturday morning. Ended up with diarrhea- could have had gastroenteritis- latest Saturday night or Sunday morning- felt better by yesterday evening  # Back pain-has been worse the last few weeks- gradually increasing over last year.   -open to orthopedic referral- placed today - also with this some Balance concerns-reports worsening balance issues over the last 6 months along with muscle weakness- prefers to talk with ortho . Knee contributes to balance issues due to pain  # Abdominal aortic aneurysm S: July 2024  CT of the abdomen and pelvis which was largely reassuring other than a 3.5 cm infrarenal abdominal aortic aneurysm was noted.   A/P:  Recommendation was for every 2-year follow-up with ultrasound. Will be due next year  #CAD-follows with Dr. [name].  Status post drug-eluting stent 2018 # Atrial flutter-cardiology started him on Eliquis  06/10/2023- went down to half due to bleeding on this.  #hyperlipidemia-LDL goal under 70 S: Medication: Rosuvastatin  40  mg, Vascepa  2 g twice daily, fenofibrate  160 mg daily, aspirin  81 mg daily--> Eliquis  5 mg twice daily with atrial flutter history Lab Results  Component Value Date   CHOL 121 04/01/2024   HDL 44 04/01/2024   LDLCALC 60 04/01/2024   LDLDIRECT 60.0 02/18/2022   TRIG 86 04/01/2024   CHOLHDL 2.8 04/01/2024  In regards to CAD potential symptoms -no chest pain- stable  shortness of breath but they are not sure this is cardiac  -Nuclear stress test on 04/02/2024 no evidence of ischemia or infarction/low risk  A/P: coronary artery disease likely asymptomatic - cardiology does not think shortness of breath is related to his heart. No chest pain. Cholesterol at goal continue current medications . Stay on eliquis  but discussed with a fib history risk of strokes till wtith only 2.5 mg twice daily  With shortness of breath there ews a nodule in January- he apperas to be due for repeat- encouraged him to call pulmonary   #hypertension S: medication: Ziac  2.5-6.25Mg  (half dose) Home readings #s: Has home cuff-typically not checking BP Readings from Last 3 Encounters:  05/10/24 112/68  04/08/24 133/77  04/06/24 104/61  A/P: well controlled continue current medications   # GERD S:Medication: Protonix  40Mg .  A/P: reasonable control continue current medications    #Alcoholic neuropathy/alcohol dependence S: Medication: I recommended thiamine as well as B12. Naltrexone  in the past through Dr. Geoffry not helpful Symptoms: Numbness and tingling in feet.  Denies significant pain  Current alcohol intake: At last visit reported 2-3 beverages per day but down from prior more elevated use, today reports pretty consistent at 2- continue to monitor   A/P: alcoholic neuropathy stable not worsening on less alcohol- congratulated For alcohol dependence full remission- stm    #Macrocytosis- likely related to alcohol intake.  B12 and folate levels have been normal, pathology smear review was normal-no further work-up  planned after 11/17/2019 unless worsening other cell lines- he is on B12 with low normal levels in past Lab Results  Component Value Date   VITAMINB12 310 10/21/2022   #Phlegm in the morning- previously thought possibly related to allergies.  About the same- dayquil helps  #intermittent psoriasis issues  #shooting pain in nedk - worse with position change- he will mention to orthopedic when he sees them  Recommended follow up: Return in about 6 months (around 11/08/2024) for followup or sooner if needed.Schedule b4 you leave. Future Appointments  Date Time Provider Department Center  06/07/2024  3:35 PM West, Katlyn D, NP [facility]  12/23/2024  2:20 PM [facility] ANNUAL WELLNESS VISIT 1 [facility]ian   Lab/Order associations:NOT fasting   ICD-10-CM   1. Preventative health care  Z00.00     2. Immunization due  Z23 Flu vaccine HIGH DOSE PF(Fluzone Trivalent)    3. Alcoholism in recovery (HCC)  F10.21     4. Chronic bilateral low back pain without sciatica  M54.50 Ambulatory referral to Orthopedic Surgery   G89.29     5. Essential hypertension  I10 Comprehensive metabolic panel with GFR    CBC with Differential/Platelet    Urinalysis, Routine w reflex microscopic    Vitamin B12    6. Hyperlipidemia with target low density lipoprotein (LDL) cholesterol less than 70 mg/dL  Z21.4 Comprehensive metabolic panel with GFR    CBC with Differential/Platelet    Microalbumin / creatinine urine ratio      No orders of the defined types were placed in this encounter.   Return precautions advised.  Garnette Lukes, MD

## 2024-05-10 NOTE — Patient Instructions (Addendum)
 Please stop by lab before you go If you have mychart- we will send your results within 3 business days of us  receiving them.  If you do not have mychart- we will call you about results within 5 business days of us  receiving them.  *please also note that you will see labs on mychart as soon as they post. I will later go in and write notes on them- will say notes from Dr. Katrinka   No changes today unless labs lead us  to make changes other than eliminating alcohol ideally   We have placed a referral for you today to orthopedics- please call their # if you do not hear within a week (may be listed below or you may see mychart message within a few days with #).   With shortness of breath there ews a nodule in January- he apperas to be due for repeat- encouraged him to call pulmonary    Recommended follow up: Return in about 6 months (around 11/08/2024) for followup or sooner if needed.Schedule b4 you leave.

## 2024-05-11 ENCOUNTER — Ambulatory Visit: Payer: Self-pay | Admitting: Family Medicine

## 2024-05-11 LAB — COMPREHENSIVE METABOLIC PANEL WITH GFR
ALT: 28 U/L (ref 0–53)
AST: 46 U/L — ABNORMAL HIGH (ref 0–37)
Albumin: 4 g/dL (ref 3.5–5.2)
Alkaline Phosphatase: 45 U/L (ref 39–117)
BUN: 19 mg/dL (ref 6–23)
CO2: 22 meq/L (ref 19–32)
Calcium: 9.5 mg/dL (ref 8.4–10.5)
Chloride: 104 meq/L (ref 96–112)
Creatinine, Ser: 1.63 mg/dL — ABNORMAL HIGH (ref 0.40–1.50)
GFR: 40.68 mL/min — ABNORMAL LOW (ref >60.00)
Glucose, Bld: 107 mg/dL — ABNORMAL HIGH (ref 70–99)
Potassium: 3.9 meq/L (ref 3.5–5.1)
Sodium: 136 meq/L (ref 135–145)
Total Bilirubin: 0.8 mg/dL (ref 0.2–1.2)
Total Protein: 7.2 g/dL (ref 6.0–8.3)

## 2024-05-11 LAB — CBC WITH DIFFERENTIAL/PLATELET
Basophils Absolute: 0.1 K/uL (ref 0.0–0.1)
Basophils Relative: 1.7 % (ref 0.0–3.0)
Eosinophils Absolute: 0.1 K/uL (ref 0.0–0.7)
Eosinophils Relative: 1.9 % (ref 0.0–5.0)
HCT: 48.4 % (ref 39.0–52.0)
Hemoglobin: 16 g/dL (ref 13.0–17.0)
Lymphocytes Relative: 31.8 % (ref 12.0–46.0)
Lymphs Abs: 1.6 K/uL (ref 0.7–4.0)
MCHC: 33.1 g/dL (ref 30.0–36.0)
MCV: 108.3 fl — ABNORMAL HIGH (ref 78.0–100.0)
Monocytes Absolute: 0.7 K/uL (ref 0.1–1.0)
Monocytes Relative: 13.1 % — ABNORMAL HIGH (ref 3.0–12.0)
Neutro Abs: 2.6 K/uL (ref 1.4–7.7)
Neutrophils Relative %: 51.5 % (ref 43.0–77.0)
Platelets: 144 K/uL — ABNORMAL LOW (ref 150.0–400.0)
RBC: 4.47 Mil/uL (ref 4.22–5.81)
RDW: 13.7 % (ref 11.5–15.5)
WBC: 5 K/uL (ref 4.0–10.5)

## 2024-05-11 LAB — VITAMIN B12: Vitamin B-12: 939 pg/mL — ABNORMAL HIGH (ref 211–911)

## 2024-05-26 ENCOUNTER — Telehealth: Payer: Self-pay | Admitting: Family Medicine

## 2024-05-26 NOTE — Telephone Encounter (Signed)
 Patient was sent him with urine specimen container from our lab. Patient stated he will bring in a sample today.

## 2024-05-26 NOTE — Telephone Encounter (Signed)
 Thanks for the update-I think they need to be reentered as future though so they can process them

## 2024-05-27 ENCOUNTER — Ambulatory Visit: Admitting: Physical Medicine and Rehabilitation

## 2024-05-27 ENCOUNTER — Other Ambulatory Visit

## 2024-05-28 LAB — URINALYSIS, ROUTINE W REFLEX MICROSCOPIC
Bilirubin Urine: NEGATIVE
Hgb urine dipstick: NEGATIVE
Ketones, ur: NEGATIVE
Leukocytes,Ua: NEGATIVE
Nitrite: NEGATIVE
RBC / HPF: NONE SEEN (ref 0–?)
Specific Gravity, Urine: 1.015 (ref 1.000–1.030)
Total Protein, Urine: NEGATIVE
Urine Glucose: NEGATIVE
Urobilinogen, UA: 1 (ref 0.0–1.0)
pH: 6 (ref 5.0–8.0)

## 2024-05-28 LAB — MICROALBUMIN / CREATININE URINE RATIO
Creatinine,U: 118.5 mg/dL
Microalb Creat Ratio: 8.8 mg/g (ref 0.0–30.0)
Microalb, Ur: 1 mg/dL (ref 0.0–1.9)

## 2024-05-31 ENCOUNTER — Encounter: Payer: Self-pay | Admitting: Radiology

## 2024-06-06 NOTE — Progress Notes (Deleted)
 Cardiology Office Note    Date:  06/06/2024  ID:  Charles Villa, Charles Villa [birth year], MRN [id number] PCP:  [name], MD  Cardiologist:  Vinie JAYSON Maxcy, MD  Electrophysiologist:  None   Chief Complaint: ***  History of Present Illness: .   Charles Villa is a 78 y.o. male with visit-pertinent history of CAD, hypertension, hyperlipidemia, GERD, chronic back pain, longstanding alcohol use and psoriasis.   Echocardiogram on 02/15/2019 teen showed EF 55 to 60%, no regional wall motion abnormality, grade 1 DD, trivial TR.  Exercise tolerance test obtained on 02/14/2017 showed horizontal ST segment depression noted in the inferolateral leads.  Subsequent cardiac catheterization on 02/25/2019 teen showed 90% mid left circumflex lesion treated with a 3.5 x 16 mm DES, EF 55 to 65%.   Echocardiogram in setting of dyspnea on 04/26/2022 showed EF 66 5%, no regional wall motion abnormality, G1 DD, no significant valve issue.  MPI on/26/24 was low risk, no evidence of ischemia or infarction, EF 63%.  Patient was seen in clinic on 06/10/2023 by [name], PA.  Patient was to undergo cataract surgery the day prior however surgery was delayed after anesthesia noted irregular heartbeat.  Subsequent EKG demonstrated atrial flutter with heart rate of 97 bpm.  Office visit on 06/10/2023 was noted the patient had returned to sinus rhythm.  He was started on Eliquis  5 mg twice daily.  Cardiac monitor in 06/2023 indicated sinus rhythm with IVCD, 10 runs of SVT were noted atrial fibrillation burden 6%.   Patient was in clinic on 07/07/2023 by Dr. Maxcy.  He continued to note some progressive dyspnea.  Patient had been referred to pulmonology given dyspnea as this was felt to not be cardiac related.  Was recommended that he have 50-month follow-up.  Patient was last in clinic on 03/25/2024 for preoperative cardiac evaluation for EGD and colonoscopy.  Patient noted he continued have increased dyspnea on exertion,  reported that when he exerted himself his breathing would worsen.  Also reported decreased activity tolerance.  It was recommended that patient undergo MPI given worsening dyspnea, MPI on 04/02/2024 was normal with no evidence of ischemia or infarction.  Today he presents for follow-up.  He reports that he  CAD: Cardiac catheterization on 02/24/2017 showed 90% mid left circumflex lesion treated with 3.5 x 16 mm DES. MPI on 11/22/2022 was normal and low risk.  MPI on 04/02/2024 was normal and low risk. Today he reports  Atrial fibrillation/flutter: Noted in 05/2023.  Cardiac monitor indicated atrial fibrillation burden of 6%. Patient denies any palpitations or feeling of atrial flutter fibrillation. Patient denies any bleeding problems on Eliquis .  Hypotension: Patient with intermittent history of low blood pressures, improved in office.  Blood pressure today Patient denies any dizziness, lightheadedness, presyncope or syncope.  Inferior abdominal aortic aneurysm: Measuring 3.5 cm on CT in May 2024.  Plan for follow-up study in 2026.  Hyperlipidemia: Last lipid profile on 9//2025 indicated total cholesterol 121, HDL 44, triglycerides 86 and LDL 60.   Labwork independently reviewed:   ROS: .   *** denies chest pain, shortness of breath, lower extremity edema, fatigue, palpitations, melena, hematuria, hemoptysis, diaphoresis, weakness, presyncope, syncope, orthopnea, and PND.  All other systems are reviewed and otherwise negative.  Studies Reviewed: SABRA    EKG:  EKG is ordered today, personally reviewed, demonstrating ***     CV Studies: Cardiac studies reviewed are outlined and summarized above. Otherwise please see EMR for full report. Cardiac Studies &  Procedures   ______________________________________________________________________________________________ CARDIAC CATHETERIZATION  CARDIAC CATHETERIZATION 02/24/2017  Conclusion Images from the original result were not included.    Mid Cx lesion, 90 %stenosed.  A STENT PROMUS PREM MR 3.5X16 drug eluting stent was successfully placed.  Post intervention, there is a 0% residual stenosis.  _______________  Moderate disease in the ostial and mid LAD with proximal-mid ectasia  The left ventricular systolic function is normal. The left ventricular ejection fraction is 55-65% by visual estimate.  LV end diastolic pressure is normal.  Severe focal single vessel disease involving the mid circumflex just prior to large lateral OM branch. Successful PCI DES.  Plan:  Same-day discharged today on dual antiplatelet therapy for minimum one year.  Patient will likely need titration of his anti-hypertensive agents as well as cardiac risk factor modification.  He will follow with Dr. [name].   Alm Clay, M.D., M.S. Interventional Cardiologist  Pager # [phone] Phone # [phone] [address] [place], [place] [address]  Findings Coronary Findings Diagnostic  Dominance: Right  Left Main Vessel is large. Vessel is angiographically normal.  Left Anterior Descending In the proximal to midportion is a patulous/ectatic appearing after the initial ostial lesions that appears to really be the true size of the vessel. Beyond the mid 40% lesion, the vessel tapers to a relatively small caliber vessel to before it reaches the apex The lesion is focal and concentric. The lesion is focal.  First Septal Branch Vessel is small in size.  Second Diagonal Branch Vessel is small in size.  Second Septal Branch Vessel is small in size.  Third Septal Branch Vessel is small in size.  Ramus Intermedius Vessel is moderate in size. The lesion is eccentric.  Lateral Ramus Intermedius Vessel is small in size.  Left Circumflex Vessel is large. The lesion is located proximal to the major branch, focal and eccentric.  First Obtuse Marginal Branch Vessel is small in size.  Lateral Second Obtuse  Marginal Branch Vessel is small in size.  Right Coronary Artery Vessel is large. Somewhat ectatic vessel The lesion is segmental. Smooth, diffuse  Acute Marginal Branch Vessel is moderate in size.  Right Posterior Descending Artery Vessel is moderate in size. Vessel is angiographically normal. The vessel is tortuous.  Inferior Septal Vessel is small in size.  First Right Posterolateral Branch Vessel is small in size.  Second Right Posterolateral Branch Vessel is small in size.  Intervention  Mid Cx lesion Angioplasty Lesion length: 13 mm. Lesion crossed with guidewire using a WIRE ASAHI PROWATER 180CM. Pre-stent angioplasty was performed using a BALLOON SAPPHIRE 3.0X12. Maximum pressure: 12 atm. Inflation time: 20 sec. A STENT PROMUS PREM MR 3.5X16 drug eluting stent was successfully placed. Minimum lumen area: 3.9 mm. Stent strut is well apposed. Post-stent angioplasty was performed using a BALLOON [place] EUPHORA RX3.5X12. Maximum pressure: 16 atm. Inflation time: 20 sec. The pre-interventional distal flow is normal (TIMI 3).  The post-interventional distal flow is normal (TIMI 3). The intervention was successful . No complications occurred at this lesion. CATH VISTA GUIDE 6FR XBLAD3.5 (Yes) There is a 0% residual stenosis post intervention.   STRESS TESTS  MYOCARDIAL PERFUSION IMAGING 04/02/2024  Interpretation Summary   A pharmacological stress test was performed using IV Lexiscan  0.4mg  over 10 seconds performed without concurrent submaximal exercise. The patient reported dyspnea during the stress test. Normal blood pressure and normal heart rate response noted during stress. Heart rate recovery was normal.   No ST deviation was noted. ECG was  interpretable and without significant changes. The ECG was not diagnostic due to pharmacologic protocol.   LV perfusion is normal. There is no evidence of ischemia. There is no evidence of infarction.   Left ventricular function is normal.  Nuclear stress EF: 70%. End diastolic cavity size is normal. End systolic cavity size is normal. No evidence of transient ischemic dilation (TID) noted.   CT images were obtained for attenuation correction and were examined for the presence of coronary calcium  when appropriate.   Coronary calcium  was present on the attenuation correction CT images. Mild coronary calcifications were present. Coronary calcifications were present in the left anterior descending artery distribution(s).  Stent is presenct in the LCx.   Prior study available for comparison from 11/22/2022.   The study is normal. The study is low risk.   ECHOCARDIOGRAM  ECHOCARDIOGRAM COMPLETE 04/26/2022  Narrative ECHOCARDIOGRAM REPORT    Patient Name:   Charles Villa Date of Exam: 04/26/2022 Medical Rec #:  [id number]        Height:       71.0 in Accession #:    [id number]       Weight:       227.2 lb Date of Birth:  07-31-1945        BSA:          2.226 m Patient Age:    76 years         BP:           106/66 mmHg Patient Gender: M                HR:           58 bpm. Exam Location:  [address]  Procedure: 2D Echo, Cardiac Doppler, Color Doppler and Intracardiac Opacification Agent  Indications:    r06.09 Dyspnea  History:        Patient has prior history of Echocardiogram examinations, most recent 02/18/2017. CAD; Risk Factors:Hypertension and Dyslipidemia.  Sonographer:    Carl Coma RDCS Referring Phys: [id number] ELIZABETH PECK  IMPRESSIONS   1. Left ventricular ejection fraction, by estimation, is 60 to 65%. The left ventricle has normal function. The left ventricle has no regional wall motion abnormalities. Left ventricular diastolic parameters are consistent with Grade I diastolic dysfunction (impaired relaxation). 2. Right ventricular systolic function is normal. The right ventricular size is normal. Tricuspid regurgitation signal is inadequate for assessing PA pressure. 3. The mitral valve is  normal in structure. No evidence of mitral valve regurgitation. No evidence of mitral stenosis. 4. The aortic valve is grossly normal. Aortic valve regurgitation is not visualized. No aortic stenosis is present. 5. The inferior vena cava is normal in size with greater than 50% respiratory variability, suggesting right atrial pressure of 3 mmHg.  Conclusion(s)/Recommendation(s): Normal biventricular function without evidence of hemodynamically significant valvular heart disease.  FINDINGS Left Ventricle: Left ventricular ejection fraction, by estimation, is 60 to 65%. The left ventricle has normal function. The left ventricle has no regional wall motion abnormalities. Definity  contrast agent was given IV to delineate the left ventricular endocardial borders. The left ventricular internal cavity size was normal in size. There is no left ventricular hypertrophy. Left ventricular diastolic parameters are consistent with Grade I diastolic dysfunction (impaired relaxation).  Right Ventricle: The right ventricular size is normal. No increase in right ventricular wall thickness. Right ventricular systolic function is normal. Tricuspid regurgitation signal is inadequate for assessing PA pressure.  Left Atrium: Left atrial size was normal in size.  Right Atrium: Right atrial size was normal in size.  Pericardium: There is no evidence of pericardial effusion.  Mitral Valve: The mitral valve is normal in structure. No evidence of mitral valve regurgitation. No evidence of mitral valve stenosis.  Tricuspid Valve: The tricuspid valve is normal in structure. Tricuspid valve regurgitation is trivial. No evidence of tricuspid stenosis.  Aortic Valve: The aortic valve is grossly normal. Aortic valve regurgitation is not visualized. No aortic stenosis is present.  Pulmonic Valve: The pulmonic valve was normal in structure. Pulmonic valve regurgitation is trivial. No evidence of pulmonic stenosis.  Aorta: The  aortic root is normal in size and structure.  Venous: The inferior vena cava is normal in size with greater than 50% respiratory variability, suggesting right atrial pressure of 3 mmHg.  IAS/Shunts: No atrial level shunt detected by color flow Doppler.   LEFT VENTRICLE PLAX 2D LVIDd:         5.40 cm   Diastology LVIDs:         3.70 cm   LV e' medial:    6.31 cm/s LV PW:         0.60 cm   LV E/e' medial:  10.9 LV IVS:        0.60 cm   LV e' lateral:   8.16 cm/s LVOT diam:     1.90 cm   LV E/e' lateral: 8.4 LV SV:         59 LV SV Index:   26 LVOT Area:     2.84 cm   RIGHT VENTRICLE RV Basal diam:  4.30 cm RV S prime:     12.25 cm/s TAPSE (M-mode): 2.1 cm  LEFT ATRIUM             Index        RIGHT ATRIUM           Index LA diam:        4.20 cm 1.89 cm/m   RA Area:     11.90 cm LA Vol (A2C):   38.2 ml 17.16 ml/m  RA Volume:   29.20 ml  13.12 ml/m LA Vol (A4C):   41.4 ml 18.60 ml/m LA Biplane Vol: 42.1 ml 18.91 ml/m AORTIC VALVE LVOT Vmax:   91.25 cm/s LVOT Vmean:  59.050 cm/s LVOT VTI:    0.207 m  AORTA Ao Root diam: 3.40 cm Ao Asc diam:  3.60 cm  MITRAL VALVE MV Area (PHT): 2.69 cm    SHUNTS MV Decel Time: 282 msec    Systemic VTI:  0.21 m MV E velocity: 68.70 cm/s  Systemic Diam: 1.90 cm MV A velocity: 93.80 cm/s MV E/A ratio:  0.73  Soyla Merck MD Electronically signed by Soyla Merck MD Signature Date/Time: 04/27/2022/4:45:48 PM    Final    MONITORS  LONG TERM MONITOR (3-14 DAYS) 07/07/2023  Narrative Patch Wear Time:  13 days and 23 hours (2024-11-16T15:10:52-0500 to 2024-11-30T15:10:48-0500)  Patient had a min HR of 44 bpm, max HR of 229 bpm, and avg HR of 65 bpm. Predominant underlying rhythm was Sinus Rhythm. Bundle Branch Block/IVCD was present. 10 Supraventricular Tachycardia runs occurred, the run with the fastest interval lasting 5 beats with a max rate of 126 bpm, the longest lasting 10 beats with an avg rate of 108 bpm. Atrial  Fibrillation occurred (6% burden), ranging from 44-229 bpm (avg of 91 bpm), the longest lasting 4 hours 52 mins with an avg rate of 71 bpm. Atrial Fibrillation was present at  de-activation of device. Isolated SVEs were occasional (4.2%, 55528), SVE Couplets were rare (<1.0%, 3946), and SVE Triplets were rare (<1.0%, 1062). Isolated VEs were rare (<1.0%), VE Couplets were rare (<1.0%), and no VE Triplets were present. MD notification criteria for Rapid Atrial Fibrillation met - report posted prior to notification per account request (DB).  Monitor shows sinus rhythm with an IVCD.  10 runs of SVT were noted.  A-fib/flutter was noted occasionally rapid.  PACs occurred with a frequency of about 4.2%.  The frequency of atrial fibrillation was about 6% during the monitoring period.  He is anticoagulated.  Vinie KYM Maxcy, MD, [facility], FACP [facility] Medical Director of the [facility] Diplomate of the American Board of Clinical Lipidology Attending Cardiologist Direct Dial: [phone]  Fax: [phone] Website:  [email or web address]       ______________________________________________________________________________________________       Current Reported Medications:.    No outpatient medications have been marked as taking for the 06/07/24 encounter (Appointment) with Diandra Cimini D, NP.    Physical Exam:    VS:  There were no vitals taken for this visit.   Wt Readings from Last 3 Encounters:  05/10/24 213 lb 6.4 oz (96.8 kg)  04/08/24 217 lb (98.4 kg)  04/06/24 218 lb (98.9 kg)    GEN: Well nourished, well developed in no acute distress NECK: No JVD; No carotid bruits CARDIAC: ***RRR, no murmurs, rubs, gallops RESPIRATORY:  Clear to auscultation without rales, wheezing or rhonchi  ABDOMEN: Soft, non-tender, non-distended EXTREMITIES:  No edema; No acute deformity     Asessement and Plan:.     ***      Disposition: F/u with ***  Signed, Quadre Bristol D Sorcha Rotunno, NP

## 2024-06-07 ENCOUNTER — Ambulatory Visit: Attending: Cardiology | Admitting: Cardiology

## 2024-06-08 ENCOUNTER — Encounter: Payer: Self-pay | Admitting: Cardiology

## 2024-06-10 ENCOUNTER — Other Ambulatory Visit: Payer: Self-pay | Admitting: Family Medicine

## 2024-06-11 NOTE — Progress Notes (Signed)
 Cardiology Office Note    Date:  06/14/2024  ID:  Trajon, Rosete Mar 20, 1946, MRN [id number] PCP:  [name], MD  Cardiologist:  Vinie JAYSON Maxcy, MD  Electrophysiologist:  None   Chief Complaint: Follow up for CAD  History of Present Illness: .   Charles Villa is a 78 y.o. male with visit-pertinent history of CAD, hypertension, hyperlipidemia, GERD, chronic back pain, longstanding alcohol use and psoriasis.   Echocardiogram on 02/15/2019 teen showed EF 55 to 60%, no regional wall motion abnormality, grade 1 DD, trivial TR.  Exercise tolerance test obtained on 02/14/2017 showed horizontal ST segment depression noted in the inferolateral leads.  Subsequent cardiac catheterization on 02/25/2019 teen showed 90% mid left circumflex lesion treated with a 3.5 x 16 mm DES, EF 55 to 65%.   Echocardiogram in setting of dyspnea on 04/26/2022 showed EF 66 5%, no regional wall motion abnormality, G1 DD, no significant valve issue.  MPI on/26/24 was low risk, no evidence of ischemia or infarction, EF 63%.  Patient was seen in clinic on 06/10/2023 by [name], PA.  Patient was to undergo cataract surgery the day prior however surgery was delayed after anesthesia noted irregular heartbeat.  Subsequent EKG demonstrated atrial flutter with heart rate of 97 bpm.  Office visit on 06/10/2023 was noted the patient had returned to sinus rhythm.  He was started on Eliquis  5 mg twice daily.  Cardiac monitor in 06/2023 indicated sinus rhythm with IVCD, 10 runs of SVT were noted atrial fibrillation burden 6%.   Patient was in clinic on 07/07/2023 by Dr. Maxcy.  He continued to note some progressive dyspnea.  Patient had been referred to pulmonology given dyspnea as this was felt to not be cardiac related.  Was recommended that he have 59-month follow-up.  Patient was last in clinic on 03/25/2024 for preoperative cardiac evaluation for EGD and colonoscopy.  Patient noted he continued have increased dyspnea on  exertion, reported that when he exerted himself his breathing would worsen.  Also reported decreased activity tolerance.  It was recommended that patient undergo MPI given worsening dyspnea, MPI on 04/02/2024 was normal with no evidence of ischemia or infarction.  Cardiopulmonary exercise test on 04/06/2024 with resting spirometry suggesting borderline restriction, noted to be moderate functional limitation appeared to be mainly due to deconditioning but there may be a possible mild heart failure limitation.  Exercise training program with repeat testing if symptoms persisted was recommended.  Today he presents for follow-up.  He reports that he has been doing ok, notes problems with ongoing fatigue and dyspnea on exertion.  He denies any chest pain, increased lower extremity edema, orthopnea or PND.  He denies any palpitations, presyncope or syncope.  Patient notes that he is been taking Eliquis  2.5 mg twice daily as he does have increased bleeding if he accidentally cuts himself.  Recent lab work completed at his PCPs office showed no evidence of anemia.  Labwork independently reviewed: 05/10/2024: Sodium 136, potassium 3.9, creatinine 1.63, hemoglobin 16.0, hematocrit 48.4 ROS: .   Today he denies chest pain, lower extremity edema, palpitations, melena, hematuria, hemoptysis, diaphoresis, weakness, presyncope, syncope, orthopnea, and PND.  All other systems are reviewed and otherwise negative. Studies Reviewed: SABRA   EKG:  EKG is not ordered today.  CV Studies: Cardiac studies reviewed are outlined and summarized above. Otherwise please see EMR for full report. Cardiac Studies & Procedures   ______________________________________________________________________________________________ CARDIAC CATHETERIZATION  CARDIAC CATHETERIZATION 02/24/2017  Conclusion Images from  the original result were not included.   Mid Cx lesion, 90 %stenosed.  A STENT PROMUS PREM MR 3.5X16 drug eluting stent was  successfully placed.  Post intervention, there is a 0% residual stenosis.  _______________  Moderate disease in the ostial and mid LAD with proximal-mid ectasia  The left ventricular systolic function is normal. The left ventricular ejection fraction is 55-65% by visual estimate.  LV end diastolic pressure is normal.  Severe focal single vessel disease involving the mid circumflex just prior to large lateral OM branch. Successful PCI DES.  Plan:  Same-day discharged today on dual antiplatelet therapy for minimum one year.  Patient will likely need titration of his anti-hypertensive agents as well as cardiac risk factor modification.  He will follow with Dr. [name].   Alm Clay, M.D., M.S. Interventional Cardiologist  Pager # [phone] Phone # [phone] [address] [place], [place] [address]  Findings Coronary Findings Diagnostic  Dominance: Right  Left Main Vessel is large. Vessel is angiographically normal.  Left Anterior Descending In the proximal to midportion is a patulous/ectatic appearing after the initial ostial lesions that appears to really be the true size of the vessel. Beyond the mid 40% lesion, the vessel tapers to a relatively small caliber vessel to before it reaches the apex The lesion is focal and concentric. The lesion is focal.  First Septal Branch Vessel is small in size.  Second Diagonal Branch Vessel is small in size.  Second Septal Branch Vessel is small in size.  Third Septal Branch Vessel is small in size.  Ramus Intermedius Vessel is moderate in size. The lesion is eccentric.  Lateral Ramus Intermedius Vessel is small in size.  Left Circumflex Vessel is large. The lesion is located proximal to the major branch, focal and eccentric.  First Obtuse Marginal Branch Vessel is small in size.  Lateral Second Obtuse Marginal Branch Vessel is small in size.  Right Coronary Artery Vessel is large.  Somewhat ectatic vessel The lesion is segmental. Smooth, diffuse  Acute Marginal Branch Vessel is moderate in size.  Right Posterior Descending Artery Vessel is moderate in size. Vessel is angiographically normal. The vessel is tortuous.  Inferior Septal Vessel is small in size.  First Right Posterolateral Branch Vessel is small in size.  Second Right Posterolateral Branch Vessel is small in size.  Intervention  Mid Cx lesion Angioplasty Lesion length: 13 mm. Lesion crossed with guidewire using a WIRE ASAHI PROWATER 180CM. Pre-stent angioplasty was performed using a BALLOON SAPPHIRE 3.0X12. Maximum pressure: 12 atm. Inflation time: 20 sec. A STENT PROMUS PREM MR 3.5X16 drug eluting stent was successfully placed. Minimum lumen area: 3.9 mm. Stent strut is well apposed. Post-stent angioplasty was performed using a BALLOON Wyndham EUPHORA RX3.5X12. Maximum pressure: 16 atm. Inflation time: 20 sec. The pre-interventional distal flow is normal (TIMI 3).  The post-interventional distal flow is normal (TIMI 3). The intervention was successful . No complications occurred at this lesion. CATH VISTA GUIDE 6FR XBLAD3.5 (Yes) There is a 0% residual stenosis post intervention.   STRESS TESTS  MYOCARDIAL PERFUSION IMAGING 04/02/2024  Interpretation Summary   A pharmacological stress test was performed using IV Lexiscan  0.4mg  over 10 seconds performed without concurrent submaximal exercise. The patient reported dyspnea during the stress test. Normal blood pressure and normal heart rate response noted during stress. Heart rate recovery was normal.   No ST deviation was noted. ECG was interpretable and without significant changes. The ECG was not diagnostic due to pharmacologic protocol.  LV perfusion is normal. There is no evidence of ischemia. There is no evidence of infarction.   Left ventricular function is normal. Nuclear stress EF: 70%. End diastolic cavity size is normal. End systolic cavity size  is normal. No evidence of transient ischemic dilation (TID) noted.   CT images were obtained for attenuation correction and were examined for the presence of coronary calcium  when appropriate.   Coronary calcium  was present on the attenuation correction CT images. Mild coronary calcifications were present. Coronary calcifications were present in the left anterior descending artery distribution(s).  Stent is presenct in the LCx.   Prior study available for comparison from 11/22/2022.   The study is normal. The study is low risk.   ECHOCARDIOGRAM  ECHOCARDIOGRAM COMPLETE 04/26/2022  Narrative ECHOCARDIOGRAM REPORT    Patient Name:   HAVOC SANLUIS Date of Exam: 04/26/2022 Medical Rec #:  [id number]        Height:       71.0 in Accession #:    [id number]       Weight:       227.2 lb Date of Birth:  10/27/1945        BSA:          2.226 m Patient Age:    76 years         BP:           106/66 mmHg Patient Gender: M                HR:           58 bpm. Exam Location:  [address]  Procedure: 2D Echo, Cardiac Doppler, Color Doppler and Intracardiac Opacification Agent  Indications:    r06.09 Dyspnea  History:        Patient has prior history of Echocardiogram examinations, most recent 02/18/2017. CAD; Risk Factors:Hypertension and Dyslipidemia.  Sonographer:    Carl Coma RDCS Referring Phys: [id number] ELIZABETH PECK  IMPRESSIONS   1. Left ventricular ejection fraction, by estimation, is 60 to 65%. The left ventricle has normal function. The left ventricle has no regional wall motion abnormalities. Left ventricular diastolic parameters are consistent with Grade I diastolic dysfunction (impaired relaxation). 2. Right ventricular systolic function is normal. The right ventricular size is normal. Tricuspid regurgitation signal is inadequate for assessing PA pressure. 3. The mitral valve is normal in structure. No evidence of mitral valve regurgitation. No evidence of mitral  stenosis. 4. The aortic valve is grossly normal. Aortic valve regurgitation is not visualized. No aortic stenosis is present. 5. The inferior vena cava is normal in size with greater than 50% respiratory variability, suggesting right atrial pressure of 3 mmHg.  Conclusion(s)/Recommendation(s): Normal biventricular function without evidence of hemodynamically significant valvular heart disease.  FINDINGS Left Ventricle: Left ventricular ejection fraction, by estimation, is 60 to 65%. The left ventricle has normal function. The left ventricle has no regional wall motion abnormalities. Definity  contrast agent was given IV to delineate the left ventricular endocardial borders. The left ventricular internal cavity size was normal in size. There is no left ventricular hypertrophy. Left ventricular diastolic parameters are consistent with Grade I diastolic dysfunction (impaired relaxation).  Right Ventricle: The right ventricular size is normal. No increase in right ventricular wall thickness. Right ventricular systolic function is normal. Tricuspid regurgitation signal is inadequate for assessing PA pressure.  Left Atrium: Left atrial size was normal in size.  Right Atrium: Right atrial size was normal in size.  Pericardium: There is no evidence  of pericardial effusion.  Mitral Valve: The mitral valve is normal in structure. No evidence of mitral valve regurgitation. No evidence of mitral valve stenosis.  Tricuspid Valve: The tricuspid valve is normal in structure. Tricuspid valve regurgitation is trivial. No evidence of tricuspid stenosis.  Aortic Valve: The aortic valve is grossly normal. Aortic valve regurgitation is not visualized. No aortic stenosis is present.  Pulmonic Valve: The pulmonic valve was normal in structure. Pulmonic valve regurgitation is trivial. No evidence of pulmonic stenosis.  Aorta: The aortic root is normal in size and structure.  Venous: The inferior vena cava is  normal in size with greater than 50% respiratory variability, suggesting right atrial pressure of 3 mmHg.  IAS/Shunts: No atrial level shunt detected by color flow Doppler.   LEFT VENTRICLE PLAX 2D LVIDd:         5.40 cm   Diastology LVIDs:         3.70 cm   LV e' medial:    6.31 cm/s LV PW:         0.60 cm   LV E/e' medial:  10.9 LV IVS:        0.60 cm   LV e' lateral:   8.16 cm/s LVOT diam:     1.90 cm   LV E/e' lateral: 8.4 LV SV:         59 LV SV Index:   26 LVOT Area:     2.84 cm   RIGHT VENTRICLE RV Basal diam:  4.30 cm RV S prime:     12.25 cm/s TAPSE (M-mode): 2.1 cm  LEFT ATRIUM             Index        RIGHT ATRIUM           Index LA diam:        4.20 cm 1.89 cm/m   RA Area:     11.90 cm LA Vol (A2C):   38.2 ml 17.16 ml/m  RA Volume:   29.20 ml  13.12 ml/m LA Vol (A4C):   41.4 ml 18.60 ml/m LA Biplane Vol: 42.1 ml 18.91 ml/m AORTIC VALVE LVOT Vmax:   91.25 cm/s LVOT Vmean:  59.050 cm/s LVOT VTI:    0.207 m  AORTA Ao Root diam: 3.40 cm Ao Asc diam:  3.60 cm  MITRAL VALVE MV Area (PHT): 2.69 cm    SHUNTS MV Decel Time: 282 msec    Systemic VTI:  0.21 m MV E velocity: 68.70 cm/s  Systemic Diam: 1.90 cm MV A velocity: 93.80 cm/s MV E/A ratio:  0.73  Soyla Merck MD Electronically signed by Soyla Merck MD Signature Date/Time: 04/27/2022/4:45:48 PM    Final    MONITORS  LONG TERM MONITOR (3-14 DAYS) 07/07/2023  Narrative Patch Wear Time:  13 days and 23 hours (2024-11-16T15:10:52-0500 to 2024-11-30T15:10:48-0500)  Patient had a min HR of 44 bpm, max HR of 229 bpm, and avg HR of 65 bpm. Predominant underlying rhythm was Sinus Rhythm. Bundle Branch Block/IVCD was present. 10 Supraventricular Tachycardia runs occurred, the run with the fastest interval lasting 5 beats with a max rate of 126 bpm, the longest lasting 10 beats with an avg rate of 108 bpm. Atrial Fibrillation occurred (6% burden), ranging from 44-229 bpm (avg of 91 bpm), the  longest lasting 4 hours 52 mins with an avg rate of 71 bpm. Atrial Fibrillation was present at de-activation of device. Isolated SVEs were occasional (4.2%, 55528), SVE Couplets were rare (<1.0%, 3946),  and SVE Triplets were rare (<1.0%, 1062). Isolated VEs were rare (<1.0%), VE Couplets were rare (<1.0%), and no VE Triplets were present. MD notification criteria for Rapid Atrial Fibrillation met - report posted prior to notification per account request (DB).  Monitor shows sinus rhythm with an IVCD.  10 runs of SVT were noted.  A-fib/flutter was noted occasionally rapid.  PACs occurred with a frequency of about 4.2%.  The frequency of atrial fibrillation was about 6% during the monitoring period.  He is anticoagulated.  Vinie KYM Maxcy, MD, [facility], FACP [place]  [facility] Medical Director of the [facility]ovascular [facility] Diplomate of the American Board of Clinical Lipidology Attending Cardiologist Direct Dial: [phone]  Fax: [phone] Website:  [email or web address]       ______________________________________________________________________________________________       Current Reported Medications:.    Current Meds  Medication Sig   albuterol  (VENTOLIN  HFA) 108 (90 Base) MCG/ACT inhaler Inhale 2 puffs into the lungs every 6 (six) hours as needed for wheezing or shortness of breath.   apixaban  (ELIQUIS ) 5 MG TABS tablet Take 1 tablet (5 mg total) by mouth 2 (two) times daily.   clobetasol (TEMOVATE) 0.05 % external solution Apply 1 application topically at bedtime as needed (scalp).    clobetasol cream (TEMOVATE) 0.05 % Apply 1 application topically 2 (two) times daily as needed (abdomen).    clotrimazole -betamethasone  (LOTRISONE ) cream Apply 1 application topically 2 (two) times daily as needed. psoriass   desonide  (DESONATE ) 0.05 % gel Apply 1 application  topically 2 (two) times daily as needed. Skin lesions   fenofibrate  160 MG  tablet Take 1 tablet (160 mg total) by mouth daily.   fluocinolone (VANOS) 0.01 % cream Apply 1 application topically daily as needed.    hydrocortisone  (ANUSOL -HC) 2.5 % rectal cream Place 1 application rectally 2 (two) times daily as needed for hemorrhoids or anal itching.   pantoprazole  (PROTONIX ) 40 MG tablet TAKE 1 TABLET BY MOUTH EVERY DAY (Patient taking differently: Take 40 mg by mouth daily. Two to three times per week)   polyethylene glycol (MIRALAX / GLYCOLAX) 17 g packet Take 17 g by mouth as needed.   rosuvastatin  (CRESTOR ) 40 MG tablet TAKE 1 TABLET BY MOUTH EVERY DAY   silver sulfADIAZINE (SILVADENE) 1 % cream Apply 1 application topically 2 (two) times daily as needed (groin).    VASCEPA  1 g capsule TAKE 2 CAPSULES BY MOUTH TWO TIMES A DAY.    Physical Exam:    VS:  BP 108/66   Pulse 82   Ht 5' 11 (1.803 m)   Wt 215 lb (97.5 kg)   SpO2 94%   BMI 29.99 kg/m    Wt Readings from Last 3 Encounters:  06/14/24 215 lb (97.5 kg)  05/10/24 213 lb 6.4 oz (96.8 kg)  04/08/24 217 lb (98.4 kg)    GEN: Well nourished, well developed in no acute distress NECK: No JVD; No carotid bruits CARDIAC: RRR, no murmurs, rubs, gallops RESPIRATORY:  Clear to auscultation without rales, wheezing or rhonchi  ABDOMEN: Soft, non-tender, non-distended EXTREMITIES:  No edema; No acute deformity     Asessement and Plan:.    CAD: Cardiac catheterization on 02/24/2017 showed 90% mid left circumflex lesion treated with 3.5 x 16 mm DES. MPI on 11/22/2022 was normal and low risk.  MPI on 04/02/2024 was normal and low risk. Today he denies chest pain, notes ongoing shortness of breath and some fatigue.  Reviewed recent  CPX with patient with indication for possible mild heart failure versus deconditioning with recommendation to increase activity.  Will check echocardiogram. Heart healthy diet and regular cardiovascular exercise encouraged.  Reviewed ED precautions.  Continue Eliquis  5 mg twice daily,  fenofibrate  160 mg daily, Vascepa  2 g twice daily and Crestor  40 mg daily.  Atrial fibrillation/flutter: Noted in 05/2023.  Cardiac monitor indicated atrial fibrillation burden of 6%. Patient reports that he has not had any increased palpitations or feeling of atrial fibrillation.  Rate and rhythm regular on exam.  Patient reports that he has been taking Eliquis  2.5 mg twice daily as he notes if he has a cut he does have increased bleeding, denies any other bleeding issues.  Discussed with patient that he should be taking Eliquis  5 mg twice daily given stroke risk, he does not meet criteria for reduced dosing of Eliquis , patient verbalized understanding and plans to resume Eliquis  5 mg twice daily.  Reviewed ED precautions. CHA2DS2-VASc Score = 4 [CHF History: 0, HTN History: 1, Diabetes History: 0, Stroke History: 0, Vascular Disease History: 1, Age Score: 2, Gender Score: 0].  Therefore, the patient's annual risk of stroke is 4.8 %.      Hypotension: Patient with intermittent history of low blood pressures, improved in office.  Blood pressure today 108/66. Patient denies any presyncope or syncope.  Inferior abdominal aortic aneurysm: Measuring 3.5 cm on CT in May 2024.  Plan for follow-up study in 2026.  Hyperlipidemia: Last lipid profile on 9//2025 indicated total cholesterol 121, HDL 44, triglycerides 86 and LDL 60.  Continue fenofibrate  160 mg daily, Vascepa  2 g twice daily and Crestor  40 mg daily.   Disposition: F/u with Dr. [name] in three months or sooner if needed.   Signed, Benna Arno D Darlina Mccaughey, NP

## 2024-06-14 ENCOUNTER — Encounter: Payer: Self-pay | Admitting: Cardiology

## 2024-06-14 ENCOUNTER — Ambulatory Visit: Attending: Cardiology | Admitting: Cardiology

## 2024-06-14 VITALS — BP 108/66 | HR 82 | Ht 71.0 in | Wt 215.0 lb

## 2024-06-14 DIAGNOSIS — Z9861 Coronary angioplasty status: Secondary | ICD-10-CM

## 2024-06-14 DIAGNOSIS — R0609 Other forms of dyspnea: Secondary | ICD-10-CM | POA: Diagnosis not present

## 2024-06-14 DIAGNOSIS — I2510 Atherosclerotic heart disease of native coronary artery without angina pectoris: Secondary | ICD-10-CM

## 2024-06-14 DIAGNOSIS — E785 Hyperlipidemia, unspecified: Secondary | ICD-10-CM | POA: Diagnosis not present

## 2024-06-14 DIAGNOSIS — I480 Paroxysmal atrial fibrillation: Secondary | ICD-10-CM

## 2024-06-14 NOTE — Patient Instructions (Signed)
 Medication Instructions:  Your physician recommends that you continue on your current medications as directed. Please refer to the Current Medication list given to you today.  *If you need a refill on your cardiac medications before your next appointment, please call your pharmacy*  Lab Work: none If you have labs (blood work) drawn today and your tests are completely normal, you will receive your results only by: MyChart Message (if you have MyChart) OR A paper copy in the mail If you have any lab test that is abnormal or we need to change your treatment, we will call you to review the results.  Testing/Procedures: Echocardiogram Your physician has requested that you have an echocardiogram. Echocardiography is a painless test that uses sound waves to create images of your heart. It provides your doctor with information about the size and shape of your heart and how well your heart's chambers and valves are working. This procedure takes approximately one hour. There are no restrictions for this procedure. Please do NOT wear cologne, perfume, aftershave, or lotions (deodorant is allowed). Please arrive 15 minutes prior to your appointment time.  Please note: We ask at that you not bring children with you during ultrasound (echo/ vascular) testing. Due to room size and safety concerns, children are not allowed in the ultrasound rooms during exams. Our front office staff cannot provide observation of children in our lobby area while testing is being conducted. An adult accompanying a patient to their appointment will only be allowed in the ultrasound room at the discretion of the ultrasound technician under special circumstances. We apologize for any inconvenience.   Follow-Up: At [facility], you and your health needs are our priority.  As part of our continuing mission to provide you with exceptional heart care, our providers are all part of one team.  This team includes your primary  Cardiologist (physician) and Advanced Practice Providers or APPs (Physician Assistants and Nurse Practitioners) who all work together to provide you with the care you need, when you need it.  Your next appointment:   3 month(s)  Provider:   Vinie JAYSON Maxcy, MD    We recommend signing up for the patient portal called MyChart.  Sign up information is provided on this After Visit Summary.  MyChart is used to connect with patients for Virtual Visits (Telemedicine).  Patients are able to view lab/test results, encounter notes, upcoming appointments, etc.  Non-urgent messages can be sent to your provider as well.   To learn more about what you can do with MyChart, go to [email or web address].   Other Instructions None

## 2024-06-15 ENCOUNTER — Ambulatory Visit (INDEPENDENT_AMBULATORY_CARE_PROVIDER_SITE_OTHER): Admitting: Physical Medicine and Rehabilitation

## 2024-06-15 ENCOUNTER — Other Ambulatory Visit: Payer: Self-pay

## 2024-06-15 ENCOUNTER — Encounter: Payer: Self-pay | Admitting: Physical Medicine and Rehabilitation

## 2024-06-15 DIAGNOSIS — M545 Low back pain, unspecified: Secondary | ICD-10-CM | POA: Diagnosis not present

## 2024-06-15 DIAGNOSIS — R269 Unspecified abnormalities of gait and mobility: Secondary | ICD-10-CM | POA: Diagnosis not present

## 2024-06-15 DIAGNOSIS — G8929 Other chronic pain: Secondary | ICD-10-CM | POA: Diagnosis not present

## 2024-06-15 DIAGNOSIS — R202 Paresthesia of skin: Secondary | ICD-10-CM

## 2024-06-15 DIAGNOSIS — M47819 Spondylosis without myelopathy or radiculopathy, site unspecified: Secondary | ICD-10-CM | POA: Diagnosis not present

## 2024-06-15 DIAGNOSIS — M5416 Radiculopathy, lumbar region: Secondary | ICD-10-CM

## 2024-06-15 NOTE — Progress Notes (Signed)
 Charles Villa - 78 y.o. male MRN [id number]  Date of birth: 09-Feb-1946  Office Visit Note: Visit Date: 06/15/2024 PCP: [name], MD Referred by: [name], MD  Subjective: Chief Complaint  Patient presents with   Lower Back - Pain   HPI: Charles Villa is a 78 y.o. male who comes in today per the request of Dr. Garnette Katrinka for evaluation of chronic, worsening and severe bilateral lower back pain. Pain ongoing for over a year. His pain worsens with prolonged standing and walking. He reports difficulty with household chores such as mowing the lawn due to severe discomfort. He describes pain as pinching and sore sensation, currently rates as 5 out of 10. Some relief of pain with home exercise regimen, rest and use of medications. He does take Ibuprofen as needed. No recent imaging of lumbar spine. No history of lumbar surgery/injections. He does have history of alcoholic peripheral neuropathy and tremor. He was previously evaluated by Dr. Asberry Tat with [facility] in 2018, he has not been seen recently. His gait is slow and almost shuffling like today. He reports chronic balance issues. No recent trauma or falls. History of cardiac stent placement, he is taking Eliquis .       Review of Systems  Musculoskeletal:  Positive for back pain.  Neurological:  Positive for tingling and tremors.  All other systems reviewed and are negative.  Otherwise per HPI.  Assessment & Plan: Visit Diagnoses:    ICD-10-CM   1. Chronic bilateral low back pain without sciatica  M54.50 XR Lumbar Spine 2-3 Views   G89.29 MR LUMBAR SPINE WO CONTRAST    2. Spondylosis without myelopathy or radiculopathy  M47.819 MR LUMBAR SPINE WO CONTRAST    3. Paresthesia of skin  R20.2 MR LUMBAR SPINE WO CONTRAST    4. Gait disturbance  R26.9 MR LUMBAR SPINE WO CONTRAST       Plan: Findings:  Chronic, worsening and severe bilateral lower back pain. No radicular symptoms down the legs.  Patient continues to have severe pain despite good conservative therapies such as home exercise regimen, rest and use of medications. I obtained lumbar radiographs in the office today that show multi level degenerative changes, most severe at L5-S1. There is grade 1 anterolisthesis of L4 on L5.  Patients clinical presentation and exam are complex, his course is complicated by neuropathy and tremor. Could be more of a facet mediated pain, however I can't completely rule out central canal stenosis.  We discussed treatment plan in detail today. Next step is to place order for lumbar MRI imaging. Depending on results of MRI we discussed possibility of performing lumbar injections. He has good strength to bilateral lower extremities upon exam today.     Meds & Orders: No orders of the defined types were placed in this encounter.   Orders Placed This Encounter  Procedures   XR Lumbar Spine 2-3 Views   MR LUMBAR SPINE WO CONTRAST    Follow-up: Return for Lumbar MRI review.   Procedures: No procedures performed      Clinical History: No specialty comments available.   He reports that he has never smoked. He has been exposed to tobacco smoke. He has never used smokeless tobacco. No results for input(s): HGBA1C, LABURIC in the last 8760 hours.  Objective:  VS:  HT:    WT:   BMI:     BP:   HR: bpm  TEMP: ( )  RESP:  Physical Exam  Vitals and nursing note reviewed.  HENT:     Head: Normocephalic and atraumatic.     Right Ear: External ear normal.     Left Ear: External ear normal.     Nose: Nose normal.     Mouth/Throat:     Mouth: Mucous membranes are moist.  Eyes:     Extraocular Movements: Extraocular movements intact.  Cardiovascular:     Rate and Rhythm: Normal rate.     Pulses: Normal pulses.  Pulmonary:     Effort: Pulmonary effort is normal.  Abdominal:     General: Abdomen is flat. There is no distension.  Musculoskeletal:        General: Tenderness present.      Cervical back: Normal range of motion.     Comments: Patient is slow to rise from seated position to standing. Good lumbar range of motion. No pain noted with facet loading. 5/5 strength noted with bilateral hip flexion, knee flexion/extension, ankle dorsiflexion/plantarflexion and EHL. No clonus noted bilaterally. No pain upon palpation of greater trochanters. No pain with internal/external rotation of bilateral hips. Sensation intact bilaterally. Negative slump test bilaterally. Slow and shuffling gait noted.   Skin:    General: Skin is warm and dry.     Capillary Refill: Capillary refill takes less than 2 seconds.  Neurological:     Mental Status: He is alert and oriented to person, place, and time.     Gait: Gait abnormal.  Psychiatric:        Mood and Affect: Mood normal.        Behavior: Behavior normal.     Ortho Exam  Imaging: XR Lumbar Spine 2-3 Views Result Date: 06/15/2024 AP and lateral radiographs of lumbar spine show curvature to the left. There are multi level degenerative changes, most severe at L5-S1 where there is disc height loss, facet arthropathy and biforaminal narrowing. There is grade 1 anterolisthesis at L4-L5. No fractures.    Past Medical/Family/Surgical/Social History: Medications & Allergies reviewed per EMR, new medications updated. Patient Active Problem List   Diagnosis Date Noted   Infrarenal abdominal aortic aneurysm (AAA) without rupture 02/03/2023   History of adenomatous polyp of colon 11/02/2019   Senile purpura 05/06/2019   Alcoholic peripheral neuropathy 02/09/2019   Thiamine deficiency 04/28/2017   CAD S/P percutaneous coronary angioplasty 02/24/2017   S/P cardiac catheterization 02/24/2017   Alcoholism in recovery (HCC) 02/03/2017   Tremor 05/06/2016   OCD (obsessive compulsive disorder) 01/23/2016   Essential hypertension 03/11/2014   Anal fissure 08/17/2012   Squamous cell carcinoma of skin 08/24/2010   GERD 02/23/2007    Hyperlipidemia with target low density lipoprotein (LDL) cholesterol less than 70 mg/dL 92/84/7991   PSORIASIS 02/10/2007   Backache 02/10/2007   Past Medical History:  Diagnosis Date   Adenomatous colon polyp 11/02/2019   Anal fissure 08/17/2012   Really more gluteal crease irritation- protosol hc 2.5% prn    BACK PAIN, CHRONIC 02/10/2007   CAD (coronary artery disease), native coronary artery    02/24/17-PCI/DES to mLcx, normal EF.   Diverticulosis of colon    2007   GERD 02/23/2007   GERD (gastroesophageal reflux disease)    Hyperlipidemia    Hypertension    OCD (obsessive compulsive disorder) 01/23/2016   Ocd/hoarding issues- psychiatristis- Cottle started on zoloft 100mg --> paxil    Personal history of colonic polyps 03/14/2014   08/2005. Benign. 10 year repeat. 2010 colonoscopy in record was entered erroneously.     PSORIASIS 02/10/2007  Topical therapy.      Scalp psoriasis    SQUAMOUS CELL CARCINOMA OF SKIN SITE UNSPECIFIED 08/24/2010   Annotation: face    Family History  Problem Relation Age of Onset   Stroke Mother    Hypertension Mother    Lung cancer Father        heavy smoker   Colon polyps Neg Hx    Colon cancer Neg Hx    Esophageal cancer Neg Hx    Rectal cancer Neg Hx    Stomach cancer Neg Hx    Past Surgical History:  Procedure Laterality Date   COLONOSCOPY     COLONOSCOPY W/ POLYPECTOMY     2007 Dr Debrah   CORONARY STENT INTERVENTION N/A 02/24/2017   Procedure: Coronary Stent Intervention;  Surgeon: Anner Alm ORN, MD;  Location: [facility] INVASIVE CV LAB;  Service: Cardiovascular;  Laterality: N/A;   LEFT HEART CATH AND CORONARY ANGIOGRAPHY N/A 02/24/2017   Procedure: Left Heart Cath and Coronary Angiography;  Surgeon: Anner Alm ORN, MD;  Location: [facility] INVASIVE CV LAB;  Service: Cardiovascular;  Laterality: N/A;   POLYPECTOMY     SKIN CANCER EXCISION     squamous cell CA - nose   TONSILLECTOMY     TOOTH EXTRACTION  02/2024   Social History   Occupational  History   Occupation: Solicitor: UNEMPLOYED   Occupation: retired  Tobacco Use   Smoking status: Never    Passive exposure: Past   Smokeless tobacco: Never  Vaping Use   Vaping status: Never Used  Substance and Sexual Activity   Alcohol use: Yes    Alcohol/week: 21.0 standard drinks of alcohol    Types: 21 Standard drinks or equivalent per week    Comment: 2-3 per day (gin)   Drug use: No   Sexual activity: Not on file

## 2024-06-15 NOTE — Progress Notes (Signed)
 Pain Scale   Average Pain 4 Patient advising he has chronic lower back pain radiating to right knee pain is constant.        +Driver, -BT, -Dye Allergies.

## 2024-06-22 ENCOUNTER — Encounter: Payer: Self-pay | Admitting: Physical Medicine and Rehabilitation

## 2024-07-07 ENCOUNTER — Ambulatory Visit
Admission: RE | Admit: 2024-07-07 | Discharge: 2024-07-07 | Disposition: A | Discharge status: Home / Self Care | Source: Ambulatory Visit | Attending: Physical Medicine and Rehabilitation | Admitting: Physical Medicine and Rehabilitation

## 2024-07-07 DIAGNOSIS — M47819 Spondylosis without myelopathy or radiculopathy, site unspecified: Secondary | ICD-10-CM

## 2024-07-07 DIAGNOSIS — R269 Unspecified abnormalities of gait and mobility: Secondary | ICD-10-CM

## 2024-07-07 DIAGNOSIS — M5126 Other intervertebral disc displacement, lumbar region: Secondary | ICD-10-CM | POA: Diagnosis not present

## 2024-07-07 DIAGNOSIS — M47817 Spondylosis without myelopathy or radiculopathy, lumbosacral region: Secondary | ICD-10-CM | POA: Diagnosis not present

## 2024-07-07 DIAGNOSIS — R202 Paresthesia of skin: Secondary | ICD-10-CM

## 2024-07-07 DIAGNOSIS — G8929 Other chronic pain: Secondary | ICD-10-CM

## 2024-07-12 ENCOUNTER — Ambulatory Visit: Payer: Self-pay

## 2024-07-16 ENCOUNTER — Other Ambulatory Visit: Payer: Self-pay | Admitting: Physician Assistant

## 2024-07-19 NOTE — Telephone Encounter (Signed)
 Prescription refill request for Eliquis  received. Indication: A Flutter Last office visit: 06/14/24  C West NP Scr: 1.63 on 06/10/24  Epic Age: 78 Weight: 97.5kg  Based on above findings Eliquis  5mg  twice daily is the appropriate dose.  Refill approved.

## 2024-07-26 ENCOUNTER — Ambulatory Visit (HOSPITAL_COMMUNITY)

## 2024-07-26 ENCOUNTER — Ambulatory Visit: Payer: Self-pay

## 2024-07-26 NOTE — Telephone Encounter (Signed)
 Pt to physically visit office today for MyChart assistance. Pt unable to understand instructions over the phone.   FYI Only or Action Required?: FYI only for provider: appointment scheduled on 12/31.  Patient was last seen in primary care on 05/10/2024 by [name], MD.  Called Nurse Triage reporting Knee Pain.  Symptoms began several weeks ago.  Interventions attempted: OTC medications: voltaren.  Symptoms are: gradually worsening.  Triage Disposition: See PCP When Office is Open (Within 3 Days)  Patient/caregiver understands and will follow disposition?:   Copied from CRM #[id number]. Topic: Clinical - Red Word Triage >> Jul 26, 2024  1:55 PM Joesph NOVAK wrote: Red Word that prompted transfer to Nurse Triage: Pain in left knee and back. - Reason for Disposition  [1] MODERATE pain (e.g., interferes with normal activities, limping) AND [2] present > 3 days  Answer Assessment - Initial Assessment Questions 1. LOCATION and RADIATION: Where is the pain located?      Left knee, radiates up the leg to the hip.  2. QUALITY: What does the pain feel like?  (e.g., sharp, dull, aching, burning)     When relaxed there is no pain, but upon rotation of his left knee is killer.   3. SEVERITY: How bad is the pain? What does it keep you from doing?   (Scale 1-10; or mild, moderate, severe)     8/10 when rotated  4. ONSET: When did the pain start? Does it come and go, or is it there all the time?     Few weeks, worse this last week  5. RECURRENT: Have you had this pain before? If Yes, ask: When, and what happened then?     Never, used to be an avid skiier 3 years ago without such issues  6. SETTING: Has there been any recent work, exercise or other activity that involved that part of the body?      Denies  7. AGGRAVATING FACTORS: What makes the knee pain worse? (e.g., walking, climbing stairs, running)     Rotating or stretching   8. ASSOCIATED SYMPTOMS: Is there  any swelling or redness of the knee?     Denies redness, states it's a little puffy on the inside, for which the pt applied Voltaren  9. OTHER SYMPTOMS: Do you have any other symptoms? (e.g., calf pain, chest pain, difficulty breathing, fever)     Denies  Protocols used: Knee Pain-A-AH

## 2024-07-26 NOTE — Telephone Encounter (Signed)
 Appt scheduled 07/28/2024. Dr. Katrinka will review notes.

## 2024-07-27 ENCOUNTER — Telehealth: Payer: Self-pay | Admitting: Physician Assistant

## 2024-07-27 MED ORDER — FENOFIBRATE 160 MG PO TABS: 160.0000 mg | ORAL_TABLET | Freq: Every day | ORAL | 3 refills | Status: AC

## 2024-07-27 NOTE — Telephone Encounter (Signed)
 Pt's medication was sent to pt's pharmacy as requested. Confirmation received.

## 2024-07-27 NOTE — Telephone Encounter (Signed)
" °*  STAT* If patient is at the pharmacy, call can be transferred to refill team.   1. Which medications need to be refilled? (please list name of each medication and dose if known)   fenofibrate  160 MG tablet  2. Would you like to learn more about the convenience, safety, & potential cost savings by using the [facility]? no   3. Are you open to using the [facility] (Type [facility]. no   4. Which pharmacy/location (including street and city if local pharmacy) is medication to be sent to?  [id number] - [place], [place] - [address] AT [address]     5. Do they need a 30 day or 90 day supply? 90 day    Pt out.  "

## 2024-07-28 ENCOUNTER — Ambulatory Visit: Admitting: Emergency Medicine

## 2024-07-28 ENCOUNTER — Encounter: Payer: Self-pay | Admitting: Emergency Medicine

## 2024-07-28 ENCOUNTER — Telehealth: Payer: Self-pay

## 2024-07-28 ENCOUNTER — Ambulatory Visit (INDEPENDENT_AMBULATORY_CARE_PROVIDER_SITE_OTHER)

## 2024-07-28 ENCOUNTER — Ambulatory Visit: Payer: Self-pay | Admitting: Emergency Medicine

## 2024-07-28 VITALS — BP 116/66 | HR 72 | Temp 98.0°F | Ht 71.0 in | Wt 212.0 lb

## 2024-07-28 DIAGNOSIS — M25562 Pain in left knee: Secondary | ICD-10-CM | POA: Diagnosis not present

## 2024-07-28 MED ORDER — ACETAMINOPHEN-CODEINE 300-30 MG PO TABS: 1.0000 | ORAL_TABLET | Freq: Four times a day (QID) | ORAL | 0 refills | Status: AC | PRN

## 2024-07-28 NOTE — Progress Notes (Signed)
 Charles Villa 78 y.o.   Chief Complaint  Patient presents with   Pain    Left knee pain     HISTORY OF PRESENT ILLNESS: This is a 78 y.o. male complaining of pain to left knee for the past couple weeks Denies injury. Still able to ambulate but with pain No other complaints or medical concerns today.  HPI   Prior to Admission medications  Medication Sig Start Date End Date Taking? Authorizing Provider  albuterol  (VENTOLIN  HFA) 108 (90 Base) MCG/ACT inhaler Inhale 2 puffs into the lungs every 6 (six) hours as needed for wheezing or shortness of breath. 07/10/23   [name], MD  apixaban  (ELIQUIS ) 5 MG TABS tablet TAKE 1 TABLET BY MOUTH TWICE A DAY 07/19/24   [name], Charles BROCKS, MD  clobetasol (TEMOVATE) 0.05 % external solution Apply 1 application topically at bedtime as needed (scalp).  07/17/17   [provider]  clobetasol cream (TEMOVATE) 0.05 % Apply 1 application topically 2 (two) times daily as needed (abdomen).  07/17/17   [provider]  clotrimazole -betamethasone  (LOTRISONE ) cream Apply 1 application topically 2 (two) times daily as needed. psoriass    [provider]  desonide  (DESONATE ) 0.05 % gel Apply 1 application  topically 2 (two) times daily as needed. Skin lesions    [provider]  fenofibrate  160 MG tablet Take 1 tablet (160 mg total) by mouth daily. 07/27/24   West, Katlyn D, NP  fluocinolone (VANOS) 0.01 % cream Apply 1 application topically daily as needed.     [provider]  hydrocortisone  (ANUSOL -HC) 2.5 % rectal cream Place 1 application rectally 2 (two) times daily as needed for hemorrhoids or anal itching.    [provider]  pantoprazole  (PROTONIX ) 40 MG tablet TAKE 1 TABLET BY MOUTH EVERY DAY Patient taking differently: Take 40 mg by mouth daily. Two to three times per week 09/03/23   Charles Charles BROCKS, MD  polyethylene glycol (MIRALAX / GLYCOLAX) 17 g packet Take 17 g by mouth as needed.     [provider]  rosuvastatin  (CRESTOR ) 40 MG tablet TAKE 1 TABLET BY MOUTH EVERY DAY 06/10/24   Charles Garnette KIDD, MD  silver sulfADIAZINE (SILVADENE) 1 % cream Apply 1 application topically 2 (two) times daily as needed (groin).  07/17/17   [provider]  VASCEPA  1 g capsule TAKE 2 CAPSULES BY MOUTH TWO TIMES A DAY. 12/23/23   Charles Charles BROCKS, MD    Allergies[1]  Patient Active Problem List   Diagnosis Date Noted   Infrarenal abdominal aortic aneurysm (AAA) without rupture 02/03/2023   History of adenomatous polyp of colon 11/02/2019   Senile purpura 05/06/2019   Alcoholic peripheral neuropathy 02/09/2019   Thiamine deficiency 04/28/2017   CAD S/P percutaneous coronary angioplasty 02/24/2017   S/P cardiac catheterization 02/24/2017   Alcoholism in recovery (HCC) 02/03/2017   Tremor 05/06/2016   OCD (obsessive compulsive disorder) 01/23/2016   Essential hypertension 03/11/2014   Anal fissure 08/17/2012   Squamous cell carcinoma of skin 08/24/2010   GERD 02/23/2007   Hyperlipidemia with target low density lipoprotein (LDL) cholesterol less than 70 mg/dL 92/84/7991   PSORIASIS 02/10/2007   Backache 02/10/2007    Past Medical History:  Diagnosis Date   Adenomatous colon polyp 11/02/2019   Anal fissure 08/17/2012   Really more gluteal crease irritation- protosol hc 2.5% prn    BACK PAIN, CHRONIC 02/10/2007   CAD (coronary artery disease), native coronary artery    02/24/17-PCI/DES  to mLcx, normal EF.   Diverticulosis of colon    2007   GERD 02/23/2007   GERD (gastroesophageal reflux disease)    Hyperlipidemia    Hypertension    OCD (obsessive compulsive disorder) 01/23/2016   Ocd/hoarding issues- psychiatristis- Cottle started on zoloft 100mg --> paxil    Personal history of colonic polyps 03/14/2014   08/2005. Benign. 10 year repeat. 2010 colonoscopy in record was entered erroneously.     PSORIASIS 02/10/2007   Topical therapy.      Scalp psoriasis    SQUAMOUS  CELL CARCINOMA OF SKIN SITE UNSPECIFIED 08/24/2010   Annotation: face     Past Surgical History:  Procedure Laterality Date   COLONOSCOPY     COLONOSCOPY W/ POLYPECTOMY     2007 Dr Debrah   CORONARY STENT INTERVENTION N/A 02/24/2017   Procedure: Coronary Stent Intervention;  Surgeon: Anner Alm ORN, MD;  Location: [facility] INVASIVE CV LAB;  Service: Cardiovascular;  Laterality: N/A;   LEFT HEART CATH AND CORONARY ANGIOGRAPHY N/A 02/24/2017   Procedure: Left Heart Cath and Coronary Angiography;  Surgeon: Anner Alm ORN, MD;  Location: [facility] INVASIVE CV LAB;  Service: Cardiovascular;  Laterality: N/A;   POLYPECTOMY     SKIN CANCER EXCISION     squamous cell CA - nose   TONSILLECTOMY     TOOTH EXTRACTION  02/2024    Social History   Socioeconomic History   Marital status: Married    Spouse name: Not on file   Number of children: 2   Years of education: Not on file   Highest education level: Not on file  Occupational History   Occupation: Solicitor: UNEMPLOYED   Occupation: retired  Tobacco Use   Smoking status: Never    Passive exposure: Past   Smokeless tobacco: Never  Vaping Use   Vaping status: Never Used  Substance and Sexual Activity   Alcohol use: Yes    Alcohol/week: 21.0 standard drinks of alcohol    Types: 21 Standard drinks or equivalent per week    Comment: 2-3 per day (gin)   Drug use: No   Sexual activity: Not on file  Other Topics Concern   Not on file  Social History Narrative   Married 42 years in 2015. 2 kids, 1 grandchild that is 40.79 years old.       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      Hobbies: ride bike, ski in winter    Social Drivers of Health   Tobacco Use: Low Risk (07/28/2024)   Patient History    Smoking Tobacco Use: Never    Smokeless Tobacco Use: Never    Passive Exposure: Past  Financial Resource Strain: Low Risk (12/11/2023)   Overall Financial Resource Strain (CARDIA)    Difficulty  of Paying Living Expenses: Not hard at all  Food Insecurity: No Food Insecurity (12/11/2023)   Hunger Vital Sign    Worried About Running Out of Food in the Last Year: Never true    Ran Out of Food in the Last Year: Never true  Transportation Needs: No Transportation Needs (12/11/2023)   PRAPARE - Administrator, Civil Service (Medical): No    Lack of Transportation (Non-Medical): No  Physical Activity: Inactive (12/11/2023)   Exercise Vital Sign    Days of Exercise per Week: 0 days    Minutes of Exercise per Session: 0 min  Stress: No Stress Concern Present (12/11/2023)   [employer]  of Occupational Health - Occupational Stress Questionnaire    Feeling of Stress : Not at all  Social Connections: Moderately Integrated (12/11/2023)   Social Connection and Isolation Panel    Frequency of Communication with Friends and Family: Once a week    Frequency of Social Gatherings with Friends and Family: More than three times a week    Attends Religious Services: More than 4 times per year    Active Member of [employer] or Organizations: No    Attends Banker Meetings: Never    Marital Status: Married  Catering Manager Violence: Not At Risk (12/11/2023)   Humiliation, Afraid, Rape, and Kick questionnaire    Fear of Current or Ex-Partner: No    Emotionally Abused: No    Physically Abused: No    Sexually Abused: No  Depression (PHQ2-9): Medium Risk (05/10/2024)   Depression (PHQ2-9)    PHQ-2 Score: 7  Alcohol Screen: Medium Risk (12/11/2023)   Alcohol Screen    Last Alcohol Screening Score (AUDIT): 8  Housing: Unknown (12/11/2023)   Housing Stability Vital Sign    Unable to Pay for Housing in the Last Year: No    Number of Times Moved in the Last Year: Not on file    Homeless in the Last Year: No  Utilities: Not At Risk (12/11/2023)   AHC Utilities    Threatened with loss of utilities: No  Health Literacy: Adequate Health Literacy (12/11/2023)   B1300 Health Literacy     Frequency of need for help with medical instructions: Never    Family History  Problem Relation Age of Onset   Stroke Mother    Hypertension Mother    Lung cancer Father        heavy smoker   Colon polyps Neg Hx    Colon cancer Neg Hx    Esophageal cancer Neg Hx    Rectal cancer Neg Hx    Stomach cancer Neg Hx      Review of Systems  Constitutional: Negative.  Negative for chills and fever.  HENT: Negative.  Negative for congestion and sore throat.   Respiratory: Negative.  Negative for cough and shortness of breath.   Cardiovascular: Negative.  Negative for chest pain and palpitations.  Gastrointestinal:  Negative for abdominal pain, diarrhea, nausea and vomiting.  Musculoskeletal:  Positive for back pain and joint pain (Left knee pain).  Skin: Negative.  Negative for rash.  Neurological: Negative.  Negative for dizziness and headaches.  All other systems reviewed and are negative.   Today's Vitals   07/28/24 0941  BP: 116/66  Pulse: 72  Temp: 98 F (36.7 C)  TempSrc: Oral  SpO2: 94%  Weight: 212 lb (96.2 kg)  Height: 5' 11 (1.803 m)   Body mass index is 29.57 kg/m.   Physical Exam Vitals reviewed.  Constitutional:      Appearance: Normal appearance.  HENT:     Head: Normocephalic.  Eyes:     Extraocular Movements: Extraocular movements intact.  Cardiovascular:     Rate and Rhythm: Normal rate.  Pulmonary:     Effort: Pulmonary effort is normal.  Musculoskeletal:     Comments: Left knee: Mild swelling.  No significant tenderness.  Full range of motion  Skin:    General: Skin is warm and dry.  Neurological:     Mental Status: He is alert and oriented to person, place, and time.     Comments: Positive tremors  Psychiatric:  Mood and Affect: Mood normal.        Behavior: Behavior normal.   DG Knee Complete 4 Views Left Result Date: 07/28/2024 CLINICAL DATA:  Left knee pain for 6-8 weeks. EXAM: LEFT KNEE - COMPLETE 4+ VIEW COMPARISON:  None  Available. FINDINGS: No evidence of fracture, dislocation, or joint effusion. Alignment and joint spaces are normal. No evidence of arthropathy or other focal bone abnormality. Mild generalized soft tissue it. IMPRESSION: Mild generalized soft tissue edema. No osseous abnormality. Electronically Signed   By: Andrea Gasman M.D.   On: 07/28/2024 10:59      ASSESSMENT & PLAN: Problem List Items Addressed This Visit       Other   Acute pain of left knee - Primary   Clinically stable.  Unremarkable exam without crepitation X-ray only shows mild edema.  No osteoarthritic findings Pain management discussed On Eliquis .  Advised to avoid NSAIDs Recommend Tylenol  for mild to moderate pain and Tylenol  with codeine for moderate to severe pain Needs to follow-up with orthopedist next week No other concerns identified today.      Relevant Medications   acetaminophen -codeine (TYLENOL  #3) 300-30 MG tablet   Other Relevant Orders   DG Knee Complete 4 Views Left (Completed)   Patient Instructions  Acute Knee Pain, Adult Many things can cause knee pain. Sometimes, knee pain is sudden (acute). It may be caused by damage, swelling, or irritation of the muscles and tissues that support your knee. Pain may come from: A fall. An injury to the knee from twisting motions. A hit to the knee. Infection. The pain often goes away on its own with time and rest. If the pain does not go away, tests may be done to find out what is causing the pain. These may include: Imaging tests, such as an X-ray, MRI, CT scan, or ultrasound. Joint aspiration. In this test, fluid is removed from the knee and checked. Arthroscopy. In this test, a lighted tube is put in the knee and an image is shown on a screen. A biopsy. In this test, a health care provider will remove a small piece of tissue for testing. Follow these instructions at home: If you have a knee sleeve or brace that can be taken off:  Wear the knee sleeve or  brace as told by your provider. Take it off only if your provider says that you can. Check the skin around it every day. Tell your provider if you see problems. Loosen the knee sleeve or brace if your toes tingle, are numb, or turn cold and blue. Keep the knee sleeve or brace clean and dry. Bathing If the knee sleeve or brace is not waterproof: Do not let it get wet. Cover it when you take a bath or shower. Use a cover that does not let any water in. Managing pain, stiffness, and swelling  If told, put ice on the area. If you have a knee sleeve or brace that you can take off, remove it as told. Put ice in a plastic bag. Place a towel between your skin and the bag. Leave the ice on for 20 minutes, 2-3 times a day. If your skin turns bright red, take off the ice right away to prevent skin damage. The risk of damage is higher if you cannot feel pain, heat, or cold. Move your toes often to reduce stiffness and swelling. Raise the injured area above the level of your heart while you are sitting or lying down. Use a  pillow to support your foot as needed. If told, use an elastic bandage to put pressure (compression) on your injured knee. This may control swelling, give support, and help with discomfort. Sleep with a pillow under your knee. Activity Rest your knee. Do not do things that cause pain or make pain worse. Do not stand or walk on your injured knee until you're told it's okay. Use crutches as told. Avoid activities where both feet leave the ground at the same time and put stress on the joints. Avoid running, jumping rope, and doing jumping jacks. Work with a physical therapist to make a safe exercise program if told. Physical therapy helps your knee move better and get stronger. Exercise as told. General instructions Take your medicines only as told by your provider. If you are overweight, work with your provider and an expert in healthy eating, called a dietician, to set goals to lose  weight. Being overweight can make your knee hurt more. Do not smoke, vape, or use products with nicotine or tobacco in them. If you need help quitting, talk with your provider. Return to normal activities when you are told. Ask what things are safe for you to do. Watch for any changes in your symptoms. Keep all follow-up visits. Your provider will check your healing and adjust treatments if needed. Contact a health care provider if: The knee pain does not stop. The knee pain changes or gets worse. You have a fever along with knee pain. Your knee is red or feels warm when you touch it. Your knee gives out or locks up. Get help right away if: Your knee swells and the swelling gets worse. You cannot move your knee. You have very bad knee pain that does not get better with medicine. This information is not intended to replace advice given to you by your health care provider. Make sure you discuss any questions you have with your health care provider. Document Revised: 04/17/2023 Document Reviewed: 09/09/2022 Elsevier Patient Education  2024 Elsevier Inc.    Emil Schaumann, MD [facility]     [1] No Known Allergies

## 2024-07-28 NOTE — Assessment & Plan Note (Signed)
 Clinically stable.  Unremarkable exam without crepitation X-ray only shows mild edema.  No osteoarthritic findings Pain management discussed On Eliquis .  Advised to avoid NSAIDs Recommend Tylenol  for mild to moderate pain and Tylenol  with codeine for moderate to severe pain Needs to follow-up with orthopedist next week No other concerns identified today.

## 2024-07-28 NOTE — Telephone Encounter (Signed)
 Copied from CRM #[id number]. Topic: General - Other >> Jul 27, 2024  2:32 PM Alfonso HERO wrote: Reason for CRM: pt calling to see if he can get info on who he saw for his ortho pain. I tried to give him info from his past visits but he wants to speak to someone in the office. Please call his home phone.  Pt stated that he was headed down to sports medicine and he would give a cb to the office

## 2024-07-28 NOTE — Patient Instructions (Signed)
 Acute Knee Pain, Adult Many things can cause knee pain. Sometimes, knee pain is sudden (acute). It may be caused by damage, swelling, or irritation of the muscles and tissues that support your knee. Pain may come from: A fall. An injury to the knee from twisting motions. A hit to the knee. Infection. The pain often goes away on its own with time and rest. If the pain does not go away, tests may be done to find out what is causing the pain. These may include: Imaging tests, such as an X-ray, MRI, CT scan, or ultrasound. Joint aspiration. In this test, fluid is removed from the knee and checked. Arthroscopy. In this test, a lighted tube is put in the knee and an image is shown on a screen. A biopsy. In this test, a health care provider will remove a small piece of tissue for testing. Follow these instructions at home: If you have a knee sleeve or brace that can be taken off:  Wear the knee sleeve or brace as told by your provider. Take it off only if your provider says that you can. Check the skin around it every day. Tell your provider if you see problems. Loosen the knee sleeve or brace if your toes tingle, are numb, or turn cold and blue. Keep the knee sleeve or brace clean and dry. Bathing If the knee sleeve or brace is not waterproof: Do not let it get wet. Cover it when you take a bath or shower. Use a cover that does not let any water in. Managing pain, stiffness, and swelling  If told, put ice on the area. If you have a knee sleeve or brace that you can take off, remove it as told. Put ice in a plastic bag. Place a towel between your skin and the bag. Leave the ice on for 20 minutes, 2-3 times a day. If your skin turns bright red, take off the ice right away to prevent skin damage. The risk of damage is higher if you cannot feel pain, heat, or cold. Move your toes often to reduce stiffness and swelling. Raise the injured area above the level of your heart while you are sitting  or lying down. Use a pillow to support your foot as needed. If told, use an elastic bandage to put pressure (compression) on your injured knee. This may control swelling, give support, and help with discomfort. Sleep with a pillow under your knee. Activity Rest your knee. Do not do things that cause pain or make pain worse. Do not stand or walk on your injured knee until you're told it's okay. Use crutches as told. Avoid activities where both feet leave the ground at the same time and put stress on the joints. Avoid running, jumping rope, and doing jumping jacks. Work with a physical therapist to make a safe exercise program if told. Physical therapy helps your knee move better and get stronger. Exercise as told. General instructions Take your medicines only as told by your provider. If you are overweight, work with your provider and an expert in healthy eating, called a dietician, to set goals to lose weight. Being overweight can make your knee hurt more. Do not smoke, vape, or use products with nicotine or tobacco in them. If you need help quitting, talk with your provider. Return to normal activities when you are told. Ask what things are safe for you to do. Watch for any changes in your symptoms. Keep all follow-up visits. Your provider will check  your healing and adjust treatments if needed. Contact a health care provider if: The knee pain does not stop. The knee pain changes or gets worse. You have a fever along with knee pain. Your knee is red or feels warm when you touch it. Your knee gives out or locks up. Get help right away if: Your knee swells and the swelling gets worse. You cannot move your knee. You have very bad knee pain that does not get better with medicine. This information is not intended to replace advice given to you by your health care provider. Make sure you discuss any questions you have with your health care provider. Document Revised: 04/17/2023 Document  Reviewed: 09/09/2022 Elsevier Patient Education  2024 [employer].

## 2024-08-02 ENCOUNTER — Encounter: Payer: Self-pay | Admitting: Family Medicine

## 2024-08-02 NOTE — Telephone Encounter (Signed)
 Please advise

## 2024-08-02 NOTE — Telephone Encounter (Signed)
 I am not his primary care physician.  Recommend to contact his PCP about this or any related questions.

## 2024-08-03 ENCOUNTER — Ambulatory Visit (INDEPENDENT_AMBULATORY_CARE_PROVIDER_SITE_OTHER): Admitting: Family Medicine

## 2024-08-03 ENCOUNTER — Encounter: Payer: Self-pay | Admitting: Family Medicine

## 2024-08-03 VITALS — BP 110/68 | HR 92 | Temp 97.8°F | Ht 71.0 in | Wt 205.8 lb

## 2024-08-03 DIAGNOSIS — F1020 Alcohol dependence, uncomplicated: Secondary | ICD-10-CM | POA: Diagnosis not present

## 2024-08-03 DIAGNOSIS — I10 Essential (primary) hypertension: Secondary | ICD-10-CM | POA: Diagnosis not present

## 2024-08-03 DIAGNOSIS — M25562 Pain in left knee: Secondary | ICD-10-CM

## 2024-08-03 NOTE — Patient Instructions (Addendum)
 Urgent referral to sports medicine for their opinion- I wonder if this could be a medial meniscal tear- they can ultrasound and potentially overdose injection if they think beneficial. Also wonder about gout but your testing on my exam seems to point more toward meniscus -they should call you by tomorrow- if not let me know  Recommended follow up: Return in about 3 months (around 11/01/2024) for followup or sooner if needed.Schedule b4 you leave.

## 2024-08-03 NOTE — Progress Notes (Signed)
 " Phone [phone] In person visit   Subjective:   Charles Villa is a 79 y.o. year old very pleasant male patient who presents for/with See problem oriented charting Chief Complaint  Patient presents with   Knee Pain    Left knee pain x8 weeks; has gotten worse past 4 weeks;     Past Medical History-  Patient Active Problem List   Diagnosis Date Noted   Infrarenal abdominal aortic aneurysm (AAA) without rupture 02/03/2023    Priority: High   CAD S/P percutaneous coronary angioplasty 02/24/2017    Priority: High   Alcoholism in recovery (HCC) 02/03/2017    Priority: High   History of adenomatous polyp of colon 11/02/2019    Priority: Medium    Alcoholic peripheral neuropathy 02/09/2019    Priority: Medium    Thiamine deficiency 04/28/2017    Priority: Medium    Tremor 05/06/2016    Priority: Medium    OCD (obsessive compulsive disorder) 01/23/2016    Priority: Medium    Essential hypertension 03/11/2014    Priority: Medium    Hyperlipidemia with target low density lipoprotein (LDL) cholesterol less than 70 mg/dL 92/84/7991    Priority: Medium    PSORIASIS 02/10/2007    Priority: Medium    Senile purpura 05/06/2019    Priority: Low   S/P cardiac catheterization 02/24/2017    Priority: Low   Anal fissure 08/17/2012    Priority: Low   Squamous cell carcinoma of skin 08/24/2010    Priority: Low   GERD 02/23/2007    Priority: Low   Backache 02/10/2007    Priority: Low   Acute pain of left knee 07/28/2024    Medications- reviewed and updated Current Outpatient Medications  Medication Sig Dispense Refill   acetaminophen -codeine  (TYLENOL  #3) 300-30 MG tablet Take 1 tablet by mouth every 6 (six) hours as needed for moderate pain (pain score 4-6). 15 tablet 0   albuterol  (VENTOLIN  HFA) 108 (90 Base) MCG/ACT inhaler Inhale 2 puffs into the lungs every 6 (six) hours as needed for wheezing or shortness of breath. 8 g 6   apixaban  (ELIQUIS ) 5 MG TABS tablet TAKE 1  TABLET BY MOUTH TWICE A DAY 60 tablet 5   clobetasol (TEMOVATE) 0.05 % external solution Apply 1 application topically at bedtime as needed (scalp).   5   clobetasol cream (TEMOVATE) 0.05 % Apply 1 application topically 2 (two) times daily as needed (abdomen).   3   clotrimazole -betamethasone  (LOTRISONE ) cream Apply 1 application topically 2 (two) times daily as needed. psoriass     desonide  (DESONATE ) 0.05 % gel Apply 1 application  topically 2 (two) times daily as needed. Skin lesions     fenofibrate  160 MG tablet Take 1 tablet (160 mg total) by mouth daily. 90 tablet 3   fluocinolone (VANOS) 0.01 % cream Apply 1 application topically daily as needed.      hydrocortisone  (ANUSOL -HC) 2.5 % rectal cream Place 1 application rectally 2 (two) times daily as needed for hemorrhoids or anal itching.     pantoprazole  (PROTONIX ) 40 MG tablet TAKE 1 TABLET BY MOUTH EVERY DAY (Patient taking differently: Take 40 mg by mouth daily. Two to three times per week) 90 tablet 1   polyethylene glycol (MIRALAX / GLYCOLAX) 17 g packet Take 17 g by mouth as needed.     rosuvastatin  (CRESTOR ) 40 MG tablet TAKE 1 TABLET BY MOUTH EVERY DAY 90 tablet 1   silver sulfADIAZINE (SILVADENE) 1 % cream Apply 1 application topically  2 (two) times daily as needed (groin).   5   VASCEPA  1 g capsule TAKE 2 CAPSULES BY MOUTH TWO TIMES A DAY. 120 capsule 5   No current facility-administered medications for this visit.     Objective:  BP 110/68 (BP Location: Left Arm, Patient Position: Sitting, Cuff Size: Normal)   Pulse 92   Temp 97.8 F (36.6 C) (Temporal)   Ht 5' 11 (1.803 m)   Wt 205 lb 12.8 oz (93.4 kg)   SpO2 95%   BMI 28.70 kg/m  Gen: NAD, resting comfortably CV: RRR no murmurs rubs or gallops Lungs: CTAB no crackles, wheeze, rhonchi Ext: no edema Skin: warm, dry Skeletal: Normal knee exam on the right, on the left patient tender to palpation on medial joint line with mild warmth in this area without erythema.   Mild discomfort with testing of MCL.  Provocative meniscal testing reproduces pain as well with McMurray's though I do not hear/feel an obvious click    Assessment and Plan    # L Knee pain S:patient with pain for about 6 weeks starting before thanksgiving. No fall or injury. Seemed to come out of nowwhere. Mainly on the medial side- notes joint line pain along that. No pain with palpation laterally. Walking with walker with pain up to 6-7/10. Gradual worsening as time goes on. Hard to stand up and walk. Twisting really hurts. No clicking or popping. Can feel unstable but pain limited. Pain can go up to the groin almost at times but mainly focused in the knee   Saw Dr. Purcell on 07/28/24 and x-ray showed generalized soft tissue edema without bony abnormalities.    Ongoing back pain and saw orthopedics about 4 weeks ago and even though the back pain persists knee seems to be the bigger issue now and does not have visit scheduled for the knee. Saw [facility]. MRI 07/07/24 showed arthritis in the back with no significant nerve compression- only mild disc bulges- though with pain closer to groin . Has not tried any of the tylenol  #3 A/P: Severe left knee pain happily affecting gait patient using walker and normally walks in on his own somewhat reassuring x-ray-Urgent referral to sports medicine for their opinion- I wonder if this could be a medial meniscal tear- they can ultrasound and potentially use injection if they think beneficial. Also wonder about gout but your testing on my exam seems to point more toward meniscus-the warmth may be concern for gout but is only on the medial side I think this makes it less likely -they should call you by tomorrow- if not let me know     #hypertension S: medication: Ziac  2.5-6.25Mg  (half dose) A/P: Blood pressure well-controlled today-continue current medication.  He is aware now to avoid NSAIDs both due to be on Eliquis  but also would be recommended to  avoid raising blood pressure  # Alcohol dependence-patient reports max 6 to 8/week beverages in recent months and in last few weeks has run out of alcohol and is not drinking without any signs of withdrawal-congratulated him on his progress and encouraged him to continue  Recommended follow up: Return in about 3 months (around 11/01/2024) for followup or sooner if needed.Schedule b4 you leave. Future Appointments  Date Time Provider Department Center  08/23/2024 12:00 PM [facility] [place]  09/13/2024 11:00 AM [name], MD [facility]  11/02/2024  2:00 PM [name], MD [facility] [place]  12/23/2024  2:20 PM [facility] ANNUAL  WELLNESS VISIT 1 [facility] [place]    Lab/Order associations:   ICD-10-CM   1. Acute pain of left knee  M25.562 Ambulatory referral to Sports Medicine    2. Essential hypertension  I10     3. Uncomplicated alcohol dependence (HCC)  F10.20       No orders of the defined types were placed in this encounter.   Return precautions advised.  Garnette Lukes, MD  "

## 2024-08-03 NOTE — Telephone Encounter (Signed)
 SCHE ON 08/03/2024

## 2024-08-06 ENCOUNTER — Encounter: Payer: Self-pay | Admitting: Family Medicine

## 2024-08-06 ENCOUNTER — Ambulatory Visit: Admitting: Family Medicine

## 2024-08-06 VITALS — BP 102/70 | HR 83 | Ht 71.0 in

## 2024-08-06 DIAGNOSIS — M25562 Pain in left knee: Secondary | ICD-10-CM | POA: Diagnosis not present

## 2024-08-06 DIAGNOSIS — R2681 Unsteadiness on feet: Secondary | ICD-10-CM | POA: Diagnosis not present

## 2024-08-06 DIAGNOSIS — R29818 Other symptoms and signs involving the nervous system: Secondary | ICD-10-CM | POA: Diagnosis not present

## 2024-08-06 DIAGNOSIS — G8929 Other chronic pain: Secondary | ICD-10-CM | POA: Diagnosis not present

## 2024-08-06 NOTE — Progress Notes (Signed)
 "        I, Leotis Batter, CMA acting as a scribe for Artist Lloyd, MD.  Charles Villa is a 79 y.o. male who presents to [facility] today for L knee pain x 6 months, worsening over the past 2 months, worsening over the last 4-wks. No falls or injury. Pt locates pain to medial aspect of the knee. The area is TTP.   L Knee swelling L LE Mechanical symptoms: L LE, shuffling gait Aggravates: WB, sit-to-stand, turning Treatments tried: Acetaminophen   His wife is a retired adult nurse.  She is concerned that he may have Parkinson's.  He did have a evaluation for Parkinson disease about 10 years ago that was positive only for neuropathy.  Dx testing: 07/28/24 L knee XR  Pertinent review of systems: No fevers or chills  Relevant historical information: Hypertension.  History of thiamine deficiency and alcohol related neuropathy.  Alcoholism in remission.   Exam:  BP 102/70   Pulse 83   Ht 5' 11 (1.803 m)   SpO2 97%   BMI 28.70 kg/m  General: Well Developed, well nourished, and in no acute distress.   MSK: Left knee mild effusion normal.  Otherwise.  Tender palpation medial joint line.  Normal motion.  Strength mildly decreased.  Neuro: Shuffling gait.  Cogwheeling present bilateral upper extremities tremor at rest present bilaterally left worse than right.    Lab and Radiology Results  Procedure: Real-time Ultrasound Guided Injection of left knee joint anterior medial approach device: Philips Affiniti 50G/GE Logiq Images permanently stored and available for review in PACS Verbal informed consent obtained.  Discussed risks and benefits of procedure. Warned about infection, bleeding, hyperglycemia damage to structures among others. Patient expresses understanding and agreement Time-out conducted.   Noted no overlying erythema, induration, or other signs of local infection.   Skin prepped in a sterile fashion.   Local anesthesia: Topical Ethyl  chloride.   With sterile technique and under real time ultrasound guidance: 40 mg of Kenalog and 2 mL of Marcaine injected into knee joint. Fluid seen entering the joint capsule.   Completed without difficulty   Pain immediately resolved suggesting accurate placement of the medication.   Advised to call if fevers/chills, erythema, induration, drainage, or persistent bleeding.   Images permanently stored and available for review in the ultrasound unit.  Impression: Technically successful ultrasound guided injection.   EXAM: LEFT KNEE - COMPLETE 4+ VIEW   COMPARISON:  None Available.   FINDINGS: No evidence of fracture, dislocation, or joint effusion. Alignment and joint spaces are normal. No evidence of arthropathy or other focal bone abnormality. Mild generalized soft tissue it.   IMPRESSION: Mild generalized soft tissue edema. No osseous abnormality.     Electronically Signed   By: Andrea Gasman M.D.   On: 07/28/2024 10:59   I, Artist Lloyd, personally (independently) visualized and performed the interpretation of the images attached in this note.     Assessment and Plan: 79 y.o. male with left knee pain due to exacerbation of DJD.  Plan for steroid injection.  However dominant problem is impaired movement and gait.  Will refer to vestibular/neuro PT.  However I am highly concerned for Parkinson's disease or similar movement disorder.  Plan to refer to neurology.  He previously was seen at [facility] neurology.  Will refer back.  His wife is a physical therapist.  She already has him using a walker at home.  I think this is a  great idea.  Of note patient slumped over while seated and was helped to the floor.  He was evaluated and was able to get back into a chair.  No visible injuries. PDMP not reviewed this encounter. Orders Placed This Encounter  Procedures   US  LIMITED JOINT SPACE STRUCTURES LOW LEFT(NO LINKED CHARGES)    Reason for Exam (SYMPTOM  OR DIAGNOSIS  REQUIRED):   left knee pain    Preferred imaging location?:   [facility]   US  LIMITED JOINT SPACE STRUCTURES LOW LEFT(NO LINKED CHARGES)    Reason for Exam (SYMPTOM  OR DIAGNOSIS REQUIRED):   left knee    Preferred imaging location?:   [facility] referral to Neurology    Referral Priority:   Routine    Referral Type:   Consultation    Referral Reason:   Specialty Services Required    Requested Specialty:   Neurology    Number of Visits Requested:   1   Ambulatory referral to Physical Therapy    Referral Priority:   Routine    Referral Type:   Physical Medicine    Referral Reason:   Specialty Services Required    Requested Specialty:   Physical Therapy    Number of Visits Requested:   1   No orders of the defined types were placed in this encounter.    Discussed warning signs or symptoms. Please see discharge instructions. Patient expresses understanding.   The above documentation has been reviewed and is accurate and complete Artist Lloyd, M.D.   "

## 2024-08-06 NOTE — Patient Instructions (Addendum)
 Thank you for coming in today.   You received an injection today. Seek immediate medical attention if the joint becomes red, extremely painful, or is oozing fluid.   We've placed a referral to [facility] and for Neuro Rehab.   See you back as needed.

## 2024-08-10 ENCOUNTER — Other Ambulatory Visit: Payer: Self-pay

## 2024-08-10 ENCOUNTER — Encounter: Payer: Self-pay | Admitting: Family Medicine

## 2024-08-10 DIAGNOSIS — G8929 Other chronic pain: Secondary | ICD-10-CM

## 2024-08-10 DIAGNOSIS — R29818 Other symptoms and signs involving the nervous system: Secondary | ICD-10-CM

## 2024-08-10 DIAGNOSIS — R2681 Unsteadiness on feet: Secondary | ICD-10-CM

## 2024-08-11 NOTE — Telephone Encounter (Signed)
 If your knee does not feel better with the injection we could proceed to an MRI.  Would you like to do that?

## 2024-08-12 ENCOUNTER — Encounter: Payer: Self-pay | Admitting: Family Medicine

## 2024-08-20 ENCOUNTER — Ambulatory Visit
Admission: RE | Admit: 2024-08-20 | Discharge: 2024-08-20 | Disposition: A | Source: Ambulatory Visit | Attending: Family Medicine | Admitting: Family Medicine

## 2024-08-20 DIAGNOSIS — G8929 Other chronic pain: Secondary | ICD-10-CM

## 2024-08-23 ENCOUNTER — Ambulatory Visit (HOSPITAL_COMMUNITY)

## 2024-08-23 ENCOUNTER — Encounter: Payer: Self-pay | Admitting: Family Medicine

## 2024-08-24 ENCOUNTER — Ambulatory Visit: Payer: Self-pay | Admitting: Family Medicine

## 2024-08-24 NOTE — Progress Notes (Signed)
 Left knee MRI shows bone bruising and an MCL strain.  How is your knee feeling?

## 2024-08-27 ENCOUNTER — Encounter: Payer: Self-pay | Admitting: Internal Medicine

## 2024-09-13 ENCOUNTER — Ambulatory Visit: Admitting: Internal Medicine

## 2024-09-30 ENCOUNTER — Ambulatory Visit (HOSPITAL_COMMUNITY)

## 2024-10-11 ENCOUNTER — Ambulatory Visit: Admitting: Emergency Medicine

## 2024-11-02 ENCOUNTER — Ambulatory Visit: Admitting: Family Medicine

## 2024-12-23 ENCOUNTER — Ambulatory Visit
